# Patient Record
Sex: Female | Born: 1945 | Race: White | Hispanic: No | Marital: Single | Smoking: Former smoker
Health system: Southern US, Community
[De-identification: ages and names within clinical notes are randomized; demographics above are authoritative.]

## PROBLEM LIST (undated history)

## (undated) DIAGNOSIS — F419 Anxiety disorder, unspecified: Secondary | ICD-10-CM

## (undated) DIAGNOSIS — G43909 Migraine, unspecified, not intractable, without status migrainosus: Secondary | ICD-10-CM

## (undated) DIAGNOSIS — M810 Age-related osteoporosis without current pathological fracture: Secondary | ICD-10-CM

## (undated) DIAGNOSIS — T7840XA Allergy, unspecified, initial encounter: Secondary | ICD-10-CM

## (undated) DIAGNOSIS — M1990 Unspecified osteoarthritis, unspecified site: Secondary | ICD-10-CM

## (undated) DIAGNOSIS — I10 Essential (primary) hypertension: Secondary | ICD-10-CM

## (undated) DIAGNOSIS — G2581 Restless legs syndrome: Secondary | ICD-10-CM

## (undated) DIAGNOSIS — N6019 Diffuse cystic mastopathy of unspecified breast: Secondary | ICD-10-CM

## (undated) DIAGNOSIS — E785 Hyperlipidemia, unspecified: Secondary | ICD-10-CM

## (undated) DIAGNOSIS — R4020 Unspecified coma: Secondary | ICD-10-CM

## (undated) DIAGNOSIS — I639 Cerebral infarction, unspecified: Secondary | ICD-10-CM

## (undated) HISTORY — DX: Migraine, unspecified, not intractable, without status migrainosus: G43.909

## (undated) HISTORY — PX: ABDOMINAL HYSTERECTOMY: SHX81

## (undated) HISTORY — DX: Allergy, unspecified, initial encounter: T78.40XA

## (undated) HISTORY — DX: Diffuse cystic mastopathy of unspecified breast: N60.19

## (undated) HISTORY — PX: BREAST CYST ASPIRATION: SHX578

## (undated) HISTORY — DX: Age-related osteoporosis without current pathological fracture: M81.0

## (undated) HISTORY — DX: Essential (primary) hypertension: I10

## (undated) HISTORY — DX: Cerebral infarction, unspecified: I63.9

## (undated) HISTORY — DX: Unspecified coma: R40.20

## (undated) HISTORY — DX: Hyperlipidemia, unspecified: E78.5

## (undated) HISTORY — DX: Restless legs syndrome: G25.81

## (undated) HISTORY — DX: Unspecified osteoarthritis, unspecified site: M19.90

## (undated) HISTORY — DX: Anxiety disorder, unspecified: F41.9

---

## 1972-09-14 HISTORY — PX: TONSILLECTOMY AND ADENOIDECTOMY: SUR1326

## 1979-09-15 HISTORY — PX: TOTAL ABDOMINAL HYSTERECTOMY W/ BILATERAL SALPINGOOPHORECTOMY: SHX83

## 1995-02-13 HISTORY — PX: DOBUTAMINE STRESS ECHO: SHX5426

## 1997-09-14 HISTORY — PX: CHOLECYSTECTOMY: SHX55

## 1997-09-14 HISTORY — PX: APPENDECTOMY: SHX54

## 2000-01-15 ENCOUNTER — Encounter (INDEPENDENT_AMBULATORY_CARE_PROVIDER_SITE_OTHER): Payer: Self-pay | Admitting: Specialist

## 2000-01-15 ENCOUNTER — Ambulatory Visit (HOSPITAL_BASED_OUTPATIENT_CLINIC_OR_DEPARTMENT_OTHER): Admission: RE | Admit: 2000-01-15 | Discharge: 2000-01-15 | Payer: Self-pay | Admitting: General Surgery

## 2004-10-14 ENCOUNTER — Ambulatory Visit: Payer: Self-pay | Admitting: Internal Medicine

## 2004-11-07 ENCOUNTER — Ambulatory Visit: Payer: Self-pay | Admitting: Family Medicine

## 2005-06-09 ENCOUNTER — Ambulatory Visit: Payer: Self-pay | Admitting: Internal Medicine

## 2005-07-10 ENCOUNTER — Ambulatory Visit: Payer: Self-pay | Admitting: Internal Medicine

## 2005-12-03 ENCOUNTER — Ambulatory Visit: Payer: Self-pay | Admitting: Internal Medicine

## 2005-12-16 ENCOUNTER — Ambulatory Visit: Payer: Self-pay | Admitting: Internal Medicine

## 2006-01-25 ENCOUNTER — Ambulatory Visit: Payer: Self-pay | Admitting: Internal Medicine

## 2006-01-26 ENCOUNTER — Ambulatory Visit: Payer: Self-pay | Admitting: Internal Medicine

## 2006-02-15 ENCOUNTER — Ambulatory Visit: Payer: Self-pay | Admitting: Internal Medicine

## 2006-04-23 ENCOUNTER — Ambulatory Visit: Payer: Self-pay | Admitting: Internal Medicine

## 2006-09-24 ENCOUNTER — Ambulatory Visit: Payer: Self-pay | Admitting: Internal Medicine

## 2006-12-15 ENCOUNTER — Ambulatory Visit: Payer: Self-pay | Admitting: Internal Medicine

## 2006-12-15 LAB — CONVERTED CEMR LAB
Cholesterol: 217 mg/dL — ABNORMAL HIGH (ref 0–200)
Glucose, Bld: 82 mg/dL (ref 70–99)
HDL: 51 mg/dL (ref >39)
LDL Cholesterol: 136 mg/dL — ABNORMAL HIGH (ref 0–99)
Total CHOL/HDL Ratio: 4.3
Triglycerides: 151 mg/dL — ABNORMAL HIGH (ref <150)
VLDL: 30 mg/dL (ref 0–40)

## 2007-01-22 LAB — CONVERTED CEMR LAB: OCCULT 1: NEGATIVE

## 2007-01-23 LAB — CONVERTED CEMR LAB: OCCULT 3: NEGATIVE

## 2007-01-24 LAB — CONVERTED CEMR LAB: OCCULT 2: NEGATIVE

## 2007-01-28 ENCOUNTER — Ambulatory Visit: Payer: Self-pay | Admitting: Internal Medicine

## 2007-08-03 ENCOUNTER — Telehealth: Payer: Self-pay | Admitting: Internal Medicine

## 2007-09-26 ENCOUNTER — Telehealth (INDEPENDENT_AMBULATORY_CARE_PROVIDER_SITE_OTHER): Payer: Self-pay | Admitting: *Deleted

## 2007-09-27 ENCOUNTER — Telehealth (INDEPENDENT_AMBULATORY_CARE_PROVIDER_SITE_OTHER): Payer: Self-pay | Admitting: *Deleted

## 2007-11-22 ENCOUNTER — Telehealth: Payer: Self-pay | Admitting: Internal Medicine

## 2007-12-19 ENCOUNTER — Telehealth (INDEPENDENT_AMBULATORY_CARE_PROVIDER_SITE_OTHER): Payer: Self-pay | Admitting: *Deleted

## 2007-12-21 ENCOUNTER — Telehealth (INDEPENDENT_AMBULATORY_CARE_PROVIDER_SITE_OTHER): Payer: Self-pay | Admitting: *Deleted

## 2008-01-23 ENCOUNTER — Telehealth (INDEPENDENT_AMBULATORY_CARE_PROVIDER_SITE_OTHER): Payer: Self-pay | Admitting: *Deleted

## 2008-02-09 ENCOUNTER — Ambulatory Visit: Payer: Self-pay | Admitting: Internal Medicine

## 2008-02-09 DIAGNOSIS — G2581 Restless legs syndrome: Secondary | ICD-10-CM | POA: Insufficient documentation

## 2008-02-09 DIAGNOSIS — F419 Anxiety disorder, unspecified: Secondary | ICD-10-CM | POA: Insufficient documentation

## 2008-02-09 DIAGNOSIS — J309 Allergic rhinitis, unspecified: Secondary | ICD-10-CM | POA: Insufficient documentation

## 2008-02-09 DIAGNOSIS — F32A Depression, unspecified: Secondary | ICD-10-CM | POA: Insufficient documentation

## 2008-02-09 DIAGNOSIS — E785 Hyperlipidemia, unspecified: Secondary | ICD-10-CM | POA: Insufficient documentation

## 2008-02-10 ENCOUNTER — Telehealth (INDEPENDENT_AMBULATORY_CARE_PROVIDER_SITE_OTHER): Payer: Self-pay | Admitting: *Deleted

## 2008-02-10 LAB — CONVERTED CEMR LAB
ALT: 12 units/L (ref 0–35)
AST: 16 units/L (ref 0–37)
Albumin: 4.4 g/dL (ref 3.5–5.2)
Alkaline Phosphatase: 108 units/L (ref 39–117)
BUN: 17 mg/dL (ref 6–23)
Basophils Absolute: 0.1 10*3/uL (ref 0.0–0.1)
Basophils Relative: 1 % (ref 0–1)
Bilirubin, Direct: 0.1 mg/dL (ref 0.0–0.3)
CO2: 24 meq/L (ref 19–32)
Calcium: 9.4 mg/dL (ref 8.4–10.5)
Chloride: 104 meq/L (ref 96–112)
Creatinine, Ser: 0.71 mg/dL (ref 0.40–1.20)
Eosinophils Absolute: 0.1 10*3/uL (ref 0.0–0.7)
Eosinophils Relative: 2 % (ref 0–5)
Glucose, Bld: 60 mg/dL — ABNORMAL LOW (ref 70–99)
HCT: 40.2 % (ref 36.0–46.0)
Hemoglobin: 12.9 g/dL (ref 12.0–15.0)
Indirect Bilirubin: 0.5 mg/dL (ref 0.0–0.9)
Lymphocytes Relative: 31 % (ref 12–46)
Lymphs Abs: 2.2 10*3/uL (ref 0.7–4.0)
MCHC: 32.1 g/dL (ref 30.0–36.0)
MCV: 88.2 fL (ref 78.0–100.0)
Monocytes Absolute: 0.7 10*3/uL (ref 0.1–1.0)
Monocytes Relative: 10 % (ref 3–12)
Neutro Abs: 3.9 10*3/uL (ref 1.7–7.7)
Neutrophils Relative %: 56 % (ref 43–77)
Phosphorus: 3.8 mg/dL (ref 2.3–4.6)
Platelets: 291 10*3/uL (ref 150–400)
Potassium: 5.4 meq/L — ABNORMAL HIGH (ref 3.5–5.3)
RBC: 4.56 M/uL (ref 3.87–5.11)
RDW: 14.4 % (ref 11.5–15.5)
Sodium: 143 meq/L (ref 135–145)
TSH: 2.542 microintl units/mL (ref 0.350–5.50)
Total Bilirubin: 0.6 mg/dL (ref 0.3–1.2)
Total Protein: 7.2 g/dL (ref 6.0–8.3)
WBC: 6.9 10*3/uL (ref 4.0–10.5)

## 2008-02-16 ENCOUNTER — Telehealth (INDEPENDENT_AMBULATORY_CARE_PROVIDER_SITE_OTHER): Payer: Self-pay | Admitting: *Deleted

## 2008-03-02 ENCOUNTER — Ambulatory Visit: Payer: Self-pay | Admitting: Internal Medicine

## 2008-03-02 LAB — CONVERTED CEMR LAB
OCCULT 1: NEGATIVE
OCCULT 2: NEGATIVE
OCCULT 3: NEGATIVE

## 2008-04-11 ENCOUNTER — Telehealth (INDEPENDENT_AMBULATORY_CARE_PROVIDER_SITE_OTHER): Payer: Self-pay | Admitting: *Deleted

## 2008-05-25 ENCOUNTER — Telehealth: Payer: Self-pay | Admitting: Internal Medicine

## 2008-06-06 ENCOUNTER — Encounter: Payer: Self-pay | Admitting: Internal Medicine

## 2008-06-08 ENCOUNTER — Telehealth: Payer: Self-pay | Admitting: Internal Medicine

## 2008-06-12 ENCOUNTER — Encounter: Payer: Self-pay | Admitting: Internal Medicine

## 2008-07-02 ENCOUNTER — Telehealth (INDEPENDENT_AMBULATORY_CARE_PROVIDER_SITE_OTHER): Payer: Self-pay | Admitting: *Deleted

## 2008-07-05 ENCOUNTER — Telehealth: Payer: Self-pay | Admitting: Internal Medicine

## 2008-07-09 ENCOUNTER — Encounter: Payer: Self-pay | Admitting: Internal Medicine

## 2008-09-13 ENCOUNTER — Telehealth (INDEPENDENT_AMBULATORY_CARE_PROVIDER_SITE_OTHER): Payer: Self-pay | Admitting: Internal Medicine

## 2008-09-17 ENCOUNTER — Telehealth: Payer: Self-pay | Admitting: Internal Medicine

## 2008-09-18 ENCOUNTER — Telehealth (INDEPENDENT_AMBULATORY_CARE_PROVIDER_SITE_OTHER): Payer: Self-pay | Admitting: *Deleted

## 2008-10-26 ENCOUNTER — Telehealth: Payer: Self-pay | Admitting: Internal Medicine

## 2009-01-21 ENCOUNTER — Telehealth: Payer: Self-pay | Admitting: Internal Medicine

## 2009-02-08 ENCOUNTER — Ambulatory Visit: Payer: Self-pay | Admitting: Internal Medicine

## 2009-02-08 DIAGNOSIS — G5710 Meralgia paresthetica, unspecified lower limb: Secondary | ICD-10-CM | POA: Insufficient documentation

## 2009-03-04 ENCOUNTER — Telehealth: Payer: Self-pay | Admitting: Internal Medicine

## 2009-03-05 ENCOUNTER — Telehealth: Payer: Self-pay | Admitting: Internal Medicine

## 2009-03-07 ENCOUNTER — Ambulatory Visit: Payer: Self-pay | Admitting: Internal Medicine

## 2009-03-08 LAB — CONVERTED CEMR LAB
Albumin: 4.2 g/dL (ref 3.5–5.2)
BUN: 19 mg/dL (ref 6–23)
Basophils Absolute: 0 10*3/uL (ref 0.0–0.1)
Basophils Relative: 0.2 % (ref 0.0–3.0)
CO2: 29 meq/L (ref 19–32)
Calcium: 9.3 mg/dL (ref 8.4–10.5)
Chloride: 104 meq/L (ref 96–112)
Creatinine, Ser: 0.8 mg/dL (ref 0.4–1.2)
Eosinophils Absolute: 0.1 10*3/uL (ref 0.0–0.7)
Eosinophils Relative: 1.4 % (ref 0.0–5.0)
Glucose, Bld: 85 mg/dL (ref 70–99)
HCT: 36.2 % (ref 36.0–46.0)
Hemoglobin: 12.4 g/dL (ref 12.0–15.0)
Lymphocytes Relative: 30.6 % (ref 12.0–46.0)
Lymphs Abs: 2.3 10*3/uL (ref 0.7–4.0)
MCHC: 34.4 g/dL (ref 30.0–36.0)
MCV: 87.1 fL (ref 78.0–100.0)
Monocytes Absolute: 0.8 10*3/uL (ref 0.1–1.0)
Monocytes Relative: 10.2 % (ref 3.0–12.0)
Neutro Abs: 4.4 10*3/uL (ref 1.4–7.7)
Neutrophils Relative %: 57.6 % (ref 43.0–77.0)
Phosphorus: 4.2 mg/dL (ref 2.3–4.6)
Platelets: 245 10*3/uL (ref 150.0–400.0)
Potassium: 4.9 meq/L (ref 3.5–5.1)
RBC: 4.15 M/uL (ref 3.87–5.11)
RDW: 13 % (ref 11.5–14.6)
Sodium: 140 meq/L (ref 135–145)
TSH: 1.98 microintl units/mL (ref 0.35–5.50)
WBC: 7.6 10*3/uL (ref 4.5–10.5)

## 2009-03-13 ENCOUNTER — Telehealth: Payer: Self-pay | Admitting: Internal Medicine

## 2009-03-19 ENCOUNTER — Telehealth: Payer: Self-pay | Admitting: Internal Medicine

## 2009-04-24 ENCOUNTER — Telehealth: Payer: Self-pay | Admitting: Internal Medicine

## 2009-05-15 ENCOUNTER — Ambulatory Visit: Payer: Self-pay | Admitting: Internal Medicine

## 2009-05-16 LAB — CONVERTED CEMR LAB
Albumin: 4.1 g/dL (ref 3.5–5.2)
BUN: 18 mg/dL (ref 6–23)
Basophils Absolute: 0.1 10*3/uL (ref 0.0–0.1)
Basophils Relative: 1.4 % (ref 0.0–3.0)
CO2: 30 meq/L (ref 19–32)
Calcium: 9.5 mg/dL (ref 8.4–10.5)
Chloride: 106 meq/L (ref 96–112)
Creatinine, Ser: 0.9 mg/dL (ref 0.4–1.2)
Eosinophils Absolute: 0.2 10*3/uL (ref 0.0–0.7)
Eosinophils Relative: 2.6 % (ref 0.0–5.0)
Glucose, Bld: 83 mg/dL (ref 70–99)
HCT: 39.4 % (ref 36.0–46.0)
Hemoglobin: 13.1 g/dL (ref 12.0–15.0)
Lymphocytes Relative: 28.7 % (ref 12.0–46.0)
Lymphs Abs: 2.2 10*3/uL (ref 0.7–4.0)
MCHC: 33.4 g/dL (ref 30.0–36.0)
MCV: 88.4 fL (ref 78.0–100.0)
Monocytes Absolute: 0.9 10*3/uL (ref 0.1–1.0)
Monocytes Relative: 11.7 % (ref 3.0–12.0)
Neutro Abs: 4.3 10*3/uL (ref 1.4–7.7)
Neutrophils Relative %: 55.6 % (ref 43.0–77.0)
Phosphorus: 4.2 mg/dL (ref 2.3–4.6)
Platelets: 258 10*3/uL (ref 150.0–400.0)
Potassium: 4.4 meq/L (ref 3.5–5.1)
RBC: 4.46 M/uL (ref 3.87–5.11)
RDW: 12.9 % (ref 11.5–14.6)
Sodium: 142 meq/L (ref 135–145)
TSH: 2.14 microintl units/mL (ref 0.35–5.50)
WBC: 7.7 10*3/uL (ref 4.5–10.5)

## 2009-05-27 ENCOUNTER — Telehealth: Payer: Self-pay | Admitting: Internal Medicine

## 2009-05-28 ENCOUNTER — Telehealth: Payer: Self-pay | Admitting: Internal Medicine

## 2009-05-29 ENCOUNTER — Ambulatory Visit: Payer: Self-pay | Admitting: Internal Medicine

## 2009-06-03 ENCOUNTER — Telehealth: Payer: Self-pay | Admitting: Internal Medicine

## 2009-06-14 ENCOUNTER — Ambulatory Visit: Payer: Self-pay | Admitting: Internal Medicine

## 2009-06-17 ENCOUNTER — Telehealth: Payer: Self-pay | Admitting: Internal Medicine

## 2009-06-17 LAB — CONVERTED CEMR LAB: Fecal Occult Bld: NEGATIVE

## 2009-06-19 ENCOUNTER — Encounter: Payer: Self-pay | Admitting: Internal Medicine

## 2009-07-10 ENCOUNTER — Encounter: Payer: Self-pay | Admitting: Internal Medicine

## 2009-07-10 LAB — HM MAMMOGRAPHY: HM Mammogram: NORMAL

## 2009-08-14 ENCOUNTER — Telehealth: Payer: Self-pay | Admitting: Internal Medicine

## 2009-09-03 ENCOUNTER — Telehealth: Payer: Self-pay | Admitting: Internal Medicine

## 2009-10-21 ENCOUNTER — Telehealth: Payer: Self-pay | Admitting: Internal Medicine

## 2009-11-04 ENCOUNTER — Telehealth: Payer: Self-pay | Admitting: Internal Medicine

## 2009-11-13 ENCOUNTER — Ambulatory Visit: Payer: Self-pay | Admitting: Internal Medicine

## 2009-12-18 ENCOUNTER — Encounter: Payer: Self-pay | Admitting: Internal Medicine

## 2009-12-20 ENCOUNTER — Telehealth: Payer: Self-pay | Admitting: Internal Medicine

## 2010-01-16 ENCOUNTER — Telehealth: Payer: Self-pay | Admitting: Family Medicine

## 2010-01-30 ENCOUNTER — Ambulatory Visit: Payer: Self-pay | Admitting: Internal Medicine

## 2010-01-30 DIAGNOSIS — L723 Sebaceous cyst: Secondary | ICD-10-CM

## 2010-02-19 ENCOUNTER — Telehealth: Payer: Self-pay | Admitting: Internal Medicine

## 2010-03-24 ENCOUNTER — Telehealth: Payer: Self-pay | Admitting: Internal Medicine

## 2010-04-23 ENCOUNTER — Telehealth: Payer: Self-pay | Admitting: Internal Medicine

## 2010-05-06 ENCOUNTER — Telehealth: Payer: Self-pay | Admitting: Internal Medicine

## 2010-05-12 ENCOUNTER — Telehealth: Payer: Self-pay | Admitting: Internal Medicine

## 2010-07-01 ENCOUNTER — Telehealth: Payer: Self-pay | Admitting: Internal Medicine

## 2010-07-22 ENCOUNTER — Ambulatory Visit: Payer: Self-pay | Admitting: Internal Medicine

## 2010-07-22 LAB — CONVERTED CEMR LAB
Bilirubin Urine: NEGATIVE
Glucose, Urine, Semiquant: NEGATIVE
Ketones, urine, test strip: NEGATIVE
Specific Gravity, Urine: 1.015
Urobilinogen, UA: 0.2
pH: 6

## 2010-08-13 ENCOUNTER — Ambulatory Visit: Payer: Self-pay | Admitting: Internal Medicine

## 2010-08-13 DIAGNOSIS — M1990 Unspecified osteoarthritis, unspecified site: Secondary | ICD-10-CM | POA: Insufficient documentation

## 2010-08-15 LAB — CONVERTED CEMR LAB
ALT: 16 units/L (ref 0–35)
Alkaline Phosphatase: 106 units/L (ref 39–117)
Basophils Relative: 0.7 % (ref 0.0–3.0)
Bilirubin, Direct: 0.1 mg/dL (ref 0.0–0.3)
Direct LDL: 147.4 mg/dL
Eosinophils Absolute: 0.1 10*3/uL (ref 0.0–0.7)
Eosinophils Relative: 1.2 % (ref 0.0–5.0)
Glucose, Bld: 87 mg/dL (ref 70–99)
HDL: 61.4 mg/dL (ref >39.00)
Lymphocytes Relative: 26.3 % (ref 12.0–46.0)
Neutrophils Relative %: 61.6 % (ref 43.0–77.0)
Phosphorus: 4 mg/dL (ref 2.3–4.6)
Platelets: 274 10*3/uL (ref 150.0–400.0)
Potassium: 4.8 meq/L (ref 3.5–5.1)
RBC: 4.44 M/uL (ref 3.87–5.11)
Sodium: 142 meq/L (ref 135–145)
Total Bilirubin: 0.5 mg/dL (ref 0.3–1.2)
Total CHOL/HDL Ratio: 4
Total Protein: 7.3 g/dL (ref 6.0–8.3)
WBC: 7.9 10*3/uL (ref 4.5–10.5)

## 2010-08-18 ENCOUNTER — Telehealth: Payer: Self-pay | Admitting: Internal Medicine

## 2010-08-27 ENCOUNTER — Telehealth: Payer: Self-pay | Admitting: Internal Medicine

## 2010-09-19 ENCOUNTER — Ambulatory Visit
Admission: RE | Admit: 2010-09-19 | Discharge: 2010-09-19 | Payer: Self-pay | Source: Home / Self Care | Attending: Internal Medicine | Admitting: Internal Medicine

## 2010-09-19 ENCOUNTER — Other Ambulatory Visit: Payer: Self-pay | Admitting: Internal Medicine

## 2010-09-19 LAB — FECAL OCCULT BLOOD, IMMUNOCHEMICAL: Fecal Occult Bld: NEGATIVE

## 2010-09-22 ENCOUNTER — Encounter: Payer: Self-pay | Admitting: Internal Medicine

## 2010-10-14 NOTE — Progress Notes (Signed)
Summary: refill request for clonazepam  Phone Note Refill Request Message from:  Fax from Pharmacy  Refills Requested: Medication #1:  CLONAZEPAM 1 MG TABS Take 1-3 tablet by mouth at bedtime.   Last Refilled: 01/16/2010 Faxed request from [facility] is on your desk.  Initial call taken by: Lowella Petties CMA,  February 19, 2010 10:46 AM  Follow-up for Phone Call        okay #90 x 0 Follow-up by: Cindee Salt MD,  February 19, 2010 2:18 PM  Additional Follow-up for Phone Call Additional follow up Details #1::        Rx faxed to pharmacy Additional Follow-up by: DeShannon Smith CMA Duncan Dull),  February 19, 2010 4:31 PM    Prescriptions: CLONAZEPAM 1 MG TABS (CLONAZEPAM) Take 1-3 tablet by mouth at bedtime  #90 x 0   Entered by:   Mervin Hack CMA (AAMA)   Authorized by:   Cindee Salt MD   Signed by:   Mervin Hack CMA (AAMA) on 02/19/2010   Method used:   Handwritten   RxID:   [id number]

## 2010-10-14 NOTE — Progress Notes (Signed)
Summary: rx-lorazepam  Phone Note Refill Request Message from:  Fax from Pharmacy on October 21, 2009 10:15 AM  Refills Requested: Medication #1:  LORAZEPAM 0.5 MG TABS Take 1 or 2  tablets  by mouth two times a day as needed for nerves   Last Refilled: 09/21/2009 #120. [facility] [place] [id number] [phone]  Initial call taken by: Silas Sacramento CMA,  October 21, 2009 10:16 AM  Follow-up for Phone Call        okay #120 x 0 Follow-up by: Cindee Salt MD,  October 21, 2009 10:35 AM  Additional Follow-up for Phone Call Additional follow up Details #1::        Rx faxed to pharmacy Additional Follow-up by: DeShannon Smith CMA Duncan Dull),  October 21, 2009 11:54 AM    Prescriptions: LORAZEPAM 0.5 MG TABS (LORAZEPAM) Take 1 or 2  tablets  by mouth two times a day as needed for nerves  #120 x 0   Entered by:   Mervin Hack CMA (AAMA)   Authorized by:   Cindee Salt MD   Signed by:   Mervin Hack CMA (AAMA) on 10/21/2009   Method used:   Handwritten   RxID:   [id number]

## 2010-10-14 NOTE — Miscellaneous (Signed)
Summary: Health Care Power of Attorney Form,[employer]  Health Care Power of Attorney Form,[employer] Death Form   Imported By: Beau Fanny 08/13/2010 13:55:06  _____________________________________________________________________  External Attachment:    Type:   Image     Comment:   External Document

## 2010-10-14 NOTE — Letter (Signed)
Summary: [facility]: Immunoassay Fecal Occult Blood (iFOB) Order Form  [place] [facility]  [address] [place], [place] [address]   Phone: [phone]  Fax: [phone]      [facility]: Immunoassay Fecal Occult Blood (iFOB) Order Form   August 13, 2010 MRN: [id number]   AMESHA BAILEY 08/02/1946   Physicican Name:_________[place]________________  Diagnosis Code:_________V76.51_________________      Cindee Salt MD

## 2010-10-14 NOTE — Assessment & Plan Note (Signed)
Summary: KNOT LEFT LEG   Vital Signs:  Patient profile:   65 year old female Weight:      171 pounds BMI:     26.09 Temp:     98.2 degrees F oral Pulse rate:   64 / minute Pulse rhythm:   regular BP sitting:   158 / 80  (left arm) Cuff size:   regular  Vitals Entered By: Mervin Hack CMA Duncan Dull) (Jan 30, 2010 4:22 PM) CC: knot on leg   History of Present Illness: Noticed a lump at upper part of left thigh Hurts ----ibuprofen has not been helping has been worsening over the past 2 weeks---first noticed  ~1 month ago  No scratches on leg No cat exposure  No fever No night sweats  Allergies: 1)  ! Sulfa 2)  ! Cipro 3)  ! Tetracycline 4)  ! Darvon 5)  ! Betadine 6)  ! * Darvocette 7)  ! Carbidopa-Levodopa (Carbidopa-Levodopa)  Past History:  Past medical, surgical, family and social histories (including risk factors) reviewed for relevance to current acute and chronic problems.  Past Medical History: Reviewed history from 02/09/2008 and no changes required. Allergic rhinitis Anxiety Fibrocystic breasts Hyperlipidemia--mild Restless legs syndrome Migraines  Past Surgical History: Reviewed history from 02/09/2008 and no changes required. 1974  Tonsils/adenoids 1981 Hysterectomy/BSO 6/96  Negative stress echo 1999 Cholecystectomy/appendix 5/01 Abdominal wall lipoma  Family History: Reviewed history from 02/09/2008 and no changes required. Mom with CHF, AAA, DM Dad died with CHF, CAD @84  1 brother shot 1 brother still living ! sister died very young No colon or breast cancer some other DM on Mom's side  Social History: Reviewed history from 05/15/2009 and no changes required. 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   Abdomen:  soft, non-tender, and no  masses.   Skin:  no lesions on left foot/leg except for a plantar wart under 2nd MTP  has slightly irregular subcutaneous mass under upper left anterior thigh partially movable under skin no obvious inflammation or infection Inguinal Nodes:  No significant adenopathy   Impression & Recommendations:  Problem # 1:  SEBACEOUS CYST (ICD-706.2) Assessment New semes to be a cyst or perhaps lipoma reassured her that it is not concerning will monitor---if keeps growing or causes more discomfort, would schedule visit to excise  Complete Medication List: 1)  Fexofenadine Hcl 30 Mg Tabs (Fexofenadine hcl) .... Take 1 tablet by mouth two times a day as needed 2)  Clonazepam 1 Mg Tabs (Clonazepam) .... Take 1-3 tablet by mouth at bedtime  Patient Instructions: 1)  Keep July appt  Current Allergies (reviewed today): ! SULFA ! CIPRO ! TETRACYCLINE ! DARVON ! BETADINE ! * DARVOCETTE ! CARBIDOPA-LEVODOPA (CARBIDOPA-LEVODOPA)

## 2010-10-14 NOTE — Progress Notes (Signed)
Summary: clonazepam   Phone Note Refill Request Message from:  Fax from Pharmacy on August 18, 2010 4:43 PM  Refills Requested: Medication #1:  CLONAZEPAM 1 MG TABS Take 1 1/2  tablet by mouth at bedtime.   Last Refilled: 07/02/2010 Refill request from [facility]. [phone]. Fax is on your desk.   Initial call taken by: Melody Comas,  August 18, 2010 4:45 PM  Follow-up for Phone Call        see the change in dose and report to pharmacy Okay #45 x 2 Follow-up by: Cindee Salt MD,  August 18, 2010 5:31 PM  Additional Follow-up for Phone Call Additional follow up Details #1::        Rx called to pharmacy Additional Follow-up by: DeShannon Smith CMA Duncan Dull),  August 19, 2010 8:29 AM    Prescriptions: CLONAZEPAM 1 MG TABS (CLONAZEPAM) Take 1 1/2  tablet by mouth at bedtime  #45 x 2   Entered by:   Mervin Hack CMA (AAMA)   Authorized by:   Cindee Salt MD   Signed by:   Mervin Hack CMA (AAMA) on 08/19/2010   Method used:   Telephoned to ...       [facility]* (retail)       [address] [place], [place]  [address]       Ph: [phone]       Fax: [phone]   RxID:   [id number]

## 2010-10-14 NOTE — Progress Notes (Signed)
Summary: Labwork  Phone Note Call from Patient Call back at Home Phone [phone]   Caller: Patient Summary of Call: would like to know if you were doing labwork at her visit on 11/13/2009? if so can she have it done before the visit? Initial call taken by: Mervin Hack CMA Duncan Dull),  November 04, 2009 11:13 AM  Follow-up for Phone Call        Labs were all normal at her last visit so no new labs are anticipated at that visit Follow-up by: Cindee Salt MD,  November 04, 2009 1:51 PM  Additional Follow-up for Phone Call Additional follow up Details #1::        Spoke with patient and advised results.  Additional Follow-up by: Mervin Hack CMA Duncan Dull),  November 04, 2009 2:46 PM

## 2010-10-14 NOTE — Assessment & Plan Note (Signed)
Summary: ? UTI   Vital Signs:  Patient profile:   65 year old female Weight:      174 pounds Temp:     98.3 degrees F oral BP sitting:   150 / 80  (left arm) Cuff size:   regular  Vitals Entered By: Mervin Hack CMA Duncan Dull) (July 22, 2010 12:51 PM) CC: UTI   History of Present Illness: Had a bad night not sleeping well last night tried some tea later she had urgency and got pain in both lower abd sides---pressure sensation  had previous problems with recurrent bladder infections saw urologist and felt it might be post coital used macrodantin back then cranberry never helped  now feels the pressure persistently May have held urine too long while out in afternoon  No fever   Allergies: 1)  ! Sulfa 2)  ! Cipro 3)  ! Tetracycline 4)  ! Darvon 5)  ! Betadine 6)  ! * Darvocette 7)  ! Carbidopa-Levodopa (Carbidopa-Levodopa)  Review of Systems       No vomiting or diarrhea appetite is okay  Physical Exam  General:  alert and normal appearance.   Abdomen:  soft, non-tender, and no masses.   Msk:  No CVA tenderness   Impression & Recommendations:  Problem # 1:  ACUTE CYSTITIS (ICD-595.0) Assessment New  classic symptoms for her no evidence of UTI will give Rx for macrodantin--3 days may be enough  Her updated medication list for this problem includes:    Macrodantin 50 Mg Caps (Nitrofurantoin macrocrystal) .Marland Kitchen... 1 tab by mouth two times a day for bladder infection  Orders: UA Dipstick w/o Micro (manual) (16109)  Complete Medication List: 1)  Fexofenadine Hcl 30 Mg Tabs (Fexofenadine hcl) .... Take 1 tablet by mouth two times a day as needed 2)  Clonazepam 1 Mg Tabs (Clonazepam) .... Take 1 1/2  tablet by mouth at bedtime 3)  Macrodantin 50 Mg Caps (Nitrofurantoin macrocrystal) .Marland Kitchen.. 1 tab by mouth two times a day for bladder infection  Patient Instructions: 1)  Start the [facility] for your bladder infection. If the symptoms are better after 1-2  doses, 3 days of the medicine may be all you  need 2)  Please keep regular appt Prescriptions: MACRODANTIN 50 MG CAPS (NITROFURANTOIN MACROCRYSTAL) 1 tab by mouth two times a day for bladder infection  #20 x 0   Entered and Authorized by:   Cindee Salt MD   Signed by:   Cindee Salt MD on 07/22/2010   Method used:   Electronically to        [facility]* (retail)       [address] [place], [place]  [address]       Ph: [phone]       Fax: [phone]   RxID:   [id number]    Orders Added: 1)  Est. Patient Level III [44010] 2)  UA Dipstick w/o Micro (manual) [81002]    Current Allergies (reviewed today): ! SULFA ! CIPRO ! TETRACYCLINE ! DARVON ! BETADINE ! * DARVOCETTE ! CARBIDOPA-LEVODOPA (CARBIDOPA-LEVODOPA)  Laboratory Results   Urine Tests  Date/Time Received: July 22, 2010 1:06 PM  Date/Time Reported: July 22, 2010 1:06 PM   Routine Urinalysis   Color: yellow Appearance: Clear Glucose: negative   (Normal Range: Negative) Bilirubin: negative   (Normal Range: Negative) Ketone: negative   (Normal Range: Negative) Spec.  Gravity: 1.015   (Normal Range: 1.003-1.035) Blood: negative   (Normal Range: Negative) pH: 6.0   (Normal Range: 5.0-8.0) Protein: negative   (Normal Range: Negative) Urobilinogen: 0.2   (Normal Range: 0-1) Nitrite: negative   (Normal Range: Negative) Leukocyte Esterace: negative   (Normal Range: Negative)

## 2010-10-14 NOTE — Assessment & Plan Note (Signed)
Summary: 6 M F/U DLO R/S FROM 11/18/09   Vital Signs:  Patient profile:   65 year old female Weight:      171 pounds Temp:     98.1 degrees F oral Pulse rate:   64 / minute Pulse rhythm:   regular Cuff size:   regular  Vitals Entered By: Mervin Hack CMA Duncan Dull) (November 13, 2009 10:49 AM) CC: 6 month follow-up, Hypertension Management   History of Present Illness: doing okay No new concerns  Trying to avoid the daytime lorazepam Husband will make her nervous in the car, etc Continues to take undue responsibility for his husband's stroke she has spoken to a Haematologist (through work) and Programmer, multimedia did just put marker on brother's grave--has gotten over some of this  still takes the klonopin at night helps the restless legs  Hypertension History:      Positive major cardiovascular risk factors include female age 41 years old or older and hyperlipidemia.  Negative major cardiovascular risk factors include non-tobacco-user status.     Allergies: 1)  ! Sulfa 2)  ! Cipro 3)  ! Tetracycline 4)  ! Darvon 5)  ! Betadine 6)  ! * Darvocette 7)  ! Carbidopa-Levodopa (Carbidopa-Levodopa)  Past History:  Past medical, surgical, family and social histories (including risk factors) reviewed for relevance to current acute and chronic problems.  Past Medical History: Reviewed history from 02/09/2008 and no changes required. Allergic rhinitis Anxiety Fibrocystic breasts Hyperlipidemia--mild Restless legs syndrome Migraines  Past Surgical History: Reviewed history from 02/09/2008 and no changes required. 1974  Tonsils/adenoids 1981 Hysterectomy/BSO 6/96  Negative stress echo 1999 Cholecystectomy/appendix 5/01 Abdominal wall lipoma  Family History: Reviewed history from 02/09/2008 and no changes required. Mom with CHF, AAA, DM Dad died with CHF, CAD @84  1 brother shot 1 brother still living ! sister died very young No colon or breast cancer some other DM on Mom's  side  Social History: Reviewed history from 05/15/2009 and no changes required. 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  NAD Psych:  normally interactive, good eye contact, not depressed appearing, and slightly anxious.     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 Take 1 or 2  tablets  by mouth two times a day as needed for nerves Her updated medication list for this problem includes:    Clonazepam 1 Mg Tabs (Clonazepam) .Marland Kitchen... Take 1-3 tablet by mouth at bedtime  Complete Medication List: 1)  Fexofenadine Hcl 30 Mg Tabs (Fexofenadine hcl) .... Take 1 tablet by mouth two times a day as needed 2)  Clonazepam 1 Mg Tabs (Clonazepam) .... Take 1-3 tablet by mouth at bedtime 3)  Cortisporin 3.5-10000-1 Susp (Neomycin-polymyxin-hc) .... 4 drops in ears up to four times daily for pain  Hypertension Assessment/Plan:  The patient's hypertensive risk group is category B: At least one risk factor (excluding diabetes) with no target organ damage.  Her calculated 10 year risk of coronary heart disease is 8 %.    Patient Instructions: 1)  Please schedule a follow-up appointment in 6 months .  Prescriptions: CLONAZEPAM 1 MG TABS (CLONAZEPAM) Take 1-3 tablet by mouth at bedtime  #90 x 1   Entered and  Authorized by:   Cindee Salt MD   Signed by:   Cindee Salt MD on 11/13/2009   Method used:   Print then Give to Patient   RxID:   [id number]   Current Allergies (reviewed today): ! SULFA ! CIPRO ! TETRACYCLINE ! DARVON ! BETADINE ! * DARVOCETTE ! CARBIDOPA-LEVODOPA (CARBIDOPA-LEVODOPA)

## 2010-10-14 NOTE — Progress Notes (Signed)
Summary: refill request for clonazepam  Phone Note Refill Request Message from:  Fax from Pharmacy  Refills Requested: Medication #1:  CLONAZEPAM 1 MG TABS Take 1-3 tablet by mouth at bedtime.   Last Refilled: 03/24/2010 Faxed request from [facility] is on your desk.  Initial call taken by: Lowella Petties CMA,  May 12, 2010 10:46 AM  Follow-up for Phone Call        okay #90 x 0 Follow-up by: Cindee Salt MD,  May 12, 2010 1:41 PM  Additional Follow-up for Phone Call Additional follow up Details #1::        Rx faxed to pharmacy Additional Follow-up by: DeShannon Smith CMA Duncan Dull),  May 12, 2010 2:07 PM    Prescriptions: CLONAZEPAM 1 MG TABS (CLONAZEPAM) Take 1-3 tablet by mouth at bedtime  #90 x 0   Entered by:   Mervin Hack CMA (AAMA)   Authorized by:   Cindee Salt MD   Signed by:   Mervin Hack CMA (AAMA) on 05/12/2010   Method used:   Handwritten   RxID:   [id number]

## 2010-10-14 NOTE — Progress Notes (Signed)
Summary: canceled appt  Phone Note Call from Patient Call back at [phone]   Summary of Call: Patient had an appt for 8-11, she called and canceled it and she has scheduled a cpx for november. She wants to know if you are alright with her not coming in until then.  Initial call taken by: Melody Comas,  April 23, 2010 9:07 AM  Follow-up for Phone Call        okay to wait till physical as long as she is doing okay    Follow-up by: Cindee Salt MD,  April 23, 2010 2:06 PM  Additional Follow-up for Phone Call Additional follow up Details #1::        Patient advised as instructed via telephone.  She is doing fine and will call before November if she needs to.  She also wanted to add that she is cutting the Clonazepam in half, she said you would be pleased with that.  Linde Gillis CMA Duncan Dull)  April 23, 2010 2:25 PM    Yes--As long as she is doing well with her needs for meds, we  can wait for PE Additional Follow-up by: Cindee Salt MD,  April 23, 2010 7:02 PM

## 2010-10-14 NOTE — Progress Notes (Signed)
Summary: Pain in leg  Phone Note Call from Patient Call back at Home Phone [phone] Call back at [phone]   Caller: Patient Call For: Cindee Salt MD Summary of Call: patient calling complaining of pain in her upper left leg right at her waist. Her biggest issue is the pain, it keeps her up at night, she just would like to know if she should be concerned. Please advise. Initial call taken by: Mervin Hack CMA Duncan Dull),  December 20, 2009 3:08 PM  Follow-up for Phone Call        probably nothing serious try heat on the area If pain is severe, or bad swelling, can get appt in Saturday clinic or go to ER I can check her next week if not improving Follow-up by: Cindee Salt MD,  December 20, 2009 3:37 PM  Additional Follow-up for Phone Call Additional follow up Details #1::        Spoke with patient and advised results.  Additional Follow-up by: Mervin Hack CMA Duncan Dull),  December 20, 2009 4:20 PM

## 2010-10-14 NOTE — Progress Notes (Signed)
Summary: refill request for clonazepam  Phone Note Refill Request Message from:  Fax from Pharmacy  Refills Requested: Medication #1:  CLONAZEPAM 1 MG TABS Take 1-3 tablet by mouth at bedtime.   Last Refilled: 05/12/2010 Faxed request from [facility] is on your desk.  Initial call taken by: Lowella Petties CMA,  July 01, 2010 4:37 PM  Follow-up for Phone Call        Okay #90 x 0 Follow-up by: Cindee Salt MD,  July 02, 2010 8:00 AM  Additional Follow-up for Phone Call Additional follow up Details #1::        Rx faxed to pharmacy Additional Follow-up by: DeShannon Smith CMA Duncan Dull),  July 02, 2010 9:37 AM    Prescriptions: CLONAZEPAM 1 MG TABS (CLONAZEPAM) Take 1-3 tablet by mouth at bedtime  #90 x 0   Entered by:   Mervin Hack CMA (AAMA)   Authorized by:   Cindee Salt MD   Signed by:   Mervin Hack CMA (AAMA) on 07/02/2010   Method used:   Handwritten   RxID:   [id number]

## 2010-10-14 NOTE — Assessment & Plan Note (Signed)
Summary: CPX PER MD/DLO   Vital Signs:  Patient profile:   65 year old female Height:      68 inches Weight:      174 pounds Temp:     97.7 degrees F oral Pulse rate:   72 / minute Pulse rhythm:   regular BP sitting:   122 / 78  (left arm) Cuff size:   regular  Vitals Entered By: Mervin Hack CMA Duncan Dull) (August 13, 2010 8:29 AM) CC: adult physical   History of Present Illness: Here for physical no new concerns Discussed and reviewed her health care POA and preferences  Anxiety is about the same still worries about husband---he won't make decisions so she has stress with this   Allergies: 1)  ! Sulfa 2)  ! Cipro 3)  ! Tetracycline 4)  ! Darvon 5)  ! Betadine 6)  ! * Darvocette 7)  ! Carbidopa-Levodopa (Carbidopa-Levodopa)  Past History:  Past medical, surgical, family and social histories (including risk factors) reviewed for relevance to current acute and chronic problems.  Past Medical History: Allergic rhinitis Anxiety Fibrocystic breasts Hyperlipidemia--mild Restless legs syndrome Migraines Osteoarthritis  Past Surgical History: Reviewed history from 02/09/2008 and no changes required. 1974  Tonsils/adenoids 1981 Hysterectomy/BSO 6/96  Negative stress echo 1999 Cholecystectomy/appendix 5/01 Abdominal wall lipoma  Family History: Reviewed history from 02/09/2008 and no changes required. Mom with CHF, AAA, DM Dad died with CHF, CAD @84  1 brother shot 1 brother still living ! 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 would accept resuscitation Wouldn't want nutritional support for vegetative state  Review of Systems General:  weight is stable Sleep is variable---has occ bad nights wears seat belt. Eyes:  Denies double vision and vision loss-1 eye;  recent eye exam fine. ENT:  Denies decreased hearing and ringing in ears; teeth fine--keeps up with dentist. CV:  Complains of lightheadness; denies chest pain or discomfort, difficulty breathing at night, difficulty breathing while lying down, fainting, palpitations, and shortness of breath with exertion; occ dizzy feeling if she gets up fast or goes without eating. Resp:  Denies cough and shortness of breath. GI:  Complains of indigestion; denies abdominal pain, bloody stools, change in bowel habits, dark tarry stools, nausea, and vomiting; rare heartburn depending on what she eats (cucumbers, cantelope). GU:  Denies dysuria and incontinence; no sexual problems. MS:  Complains of joint pain; denies joint swelling; intermittent hand, knee, hip aching ibuprofen helps tylenol gives diarrhea. Derm:  Denies lesion(s) and rash. Neuro:  Complains of headaches; denies numbness, tingling, and weakness; rare headaches with weather fronts. Psych:  Complains of anxiety; denies depression. Heme:  Denies abnormal bruising and enlarge lymph nodes. Allergy:  Complains of seasonal allergies and sneezing; allergies fairly quiet.  Physical Exam  General:  alert and normal appearance.   Eyes:  pupils equal, pupils round, pupils reactive to light, and no optic disk abnormalities.   Ears:  R ear normal and L ear normal.   Mouth:  no erythema, no exudates, and no lesions.   Neck:  supple, no masses, no thyromegaly, no carotid bruits, and no cervical lymphadenopathy.   Breasts:  no masses, no abnormal thickening, no tenderness, and no adenopathy.   Lungs:  normal respiratory effort, no intercostal retractions, no accessory muscle use, and normal breath sounds.  Heart:  normal rate, regular rhythm, no murmur, and no gallop.   Abdomen:  soft, non-tender, and no masses.   Msk:  no joint tenderness and no joint swelling.   Pulses:  normal in feet Extremities:  no edema Neurologic:  alert & oriented X3, strength  normal in all extremities, and gait normal.   Skin:  no rashes, no suspicious lesions, and no ulcerations.   Psych:  normally interactive, good eye contact, not anxious appearing, and not depressed appearing.     Impression & Recommendations:  Problem # 1:  PREVENTIVE HEALTH CARE (ICD-V70.0) Assessment Comment Only healthy will check stool immunoassay wants to wait on mammo till 2 years (next year)  Problem # 2:  ANXIETY (ICD-300.00) Assessment: Unchanged ongoing anxiety and stress dealing with husband no changes needed  Her updated medication list for this problem includes:    Clonazepam 1 Mg Tabs (Clonazepam) .Marland Kitchen... Take 1 1/2  tablet by mouth at bedtime  Problem # 3:  OSTEOARTHRITIS (ICD-715.90) Assessment: Unchanged mild does okay with occ ibuprofen  Problem # 4:  RESTLESS LEG SYNDROME (ICD-333.94) Assessment: Unchanged  clonazepam helps when needed  Orders: TLB-Renal Function Panel (80069-RENAL) TLB-CBC Platelet - w/Differential (85025-CBCD) TLB-Hepatic/Liver Function Pnl (80076-HEPATIC) TLB-TSH (Thyroid Stimulating Hormone) (84443-TSH) Fingerstick ([address])  Problem # 5:  HYPERLIPIDEMIA (ICD-272.4) Assessment: Unchanged  will recheck no Rx though  Labs Reviewed: SGOT: 16 (02/09/2008)   SGPT: 12 (02/09/2008)  Prior 10 Yr Risk Heart Disease: 8 % (11/13/2009)   HDL:51 (12/15/2006)  LDL:136 (12/15/2006)  Chol:217 (12/15/2006)  Trig:151 (12/15/2006)  Orders: TLB-Lipid Panel (80061-LIPID)  Complete Medication List: 1)  Fexofenadine Hcl 30 Mg Tabs (Fexofenadine hcl) .... Take 1 tablet by mouth two times a day as needed 2)  Clonazepam 1 Mg Tabs (Clonazepam) .... Take 1 1/2  tablet by mouth at bedtime  Patient Instructions: 1)  Please schedule a follow-up appointment in 1 year.  2)  Complete your hemoccult cards and return them soon.    Orders Added: 1)  Est. Patient 40-64 years [99396] 2)  TLB-Renal Function Panel [80069-RENAL] 3)  TLB-CBC Platelet -  w/Differential [85025-CBCD] 4)  TLB-Hepatic/Liver Function Pnl [80076-HEPATIC] 5)  TLB-TSH (Thyroid Stimulating Hormone) [84443-TSH] 6)  Fingerstick [36416] 7)  TLB-Lipid Panel [80061-LIPID]    Current Allergies (reviewed today): ! SULFA ! CIPRO ! TETRACYCLINE ! DARVON ! BETADINE ! * DARVOCETTE ! CARBIDOPA-LEVODOPA (CARBIDOPA-LEVODOPA)

## 2010-10-14 NOTE — Progress Notes (Signed)
Summary: refill request for clonazepam  Phone Note Refill Request Message from:  Fax from Pharmacy  Refills Requested: Medication #1:  CLONAZEPAM 1 MG TABS Take 1-3 tablet by mouth at bedtime.   Last Refilled: 02/19/2010 Faxed request from [facility] is on your desk.  Initial call taken by: Lowella Petties CMA,  March 24, 2010 11:04 AM  Follow-up for Phone Call        okay #90 x 0 Follow-up by: Cindee Salt MD,  March 24, 2010 1:56 PM  Additional Follow-up for Phone Call Additional follow up Details #1::        Rx faxed to pharmacy Additional Follow-up by: DeShannon Smith CMA Duncan Dull),  March 24, 2010 2:40 PM    Prescriptions: CLONAZEPAM 1 MG TABS (CLONAZEPAM) Take 1-3 tablet by mouth at bedtime  #90 x 0   Entered by:   Mervin Hack CMA (AAMA)   Authorized by:   Cindee Salt MD   Signed by:   Mervin Hack CMA (AAMA) on 03/24/2010   Method used:   Handwritten   RxID:   [id number]

## 2010-10-14 NOTE — Progress Notes (Signed)
Summary: wants something to help her sleep  Phone Note Call from Patient Call back at [phone]   Caller: Patient Call For: Cindee Salt MD Summary of Call: Patient called crying and upset, saying that she is very stressed. She says that her mother just passed away and that they had her funeral today. She has not been able to sleep in over two weeks. She is asking if she could get something to help her sleep and help her relax in the evenings. She says that she will not take the clonazepam while taking something else, but will understand if you do not feel comftorable doing this. . Uses [facility]. Patient is aware that you are out of the office until tomorrow.  Initial call taken by: Melody Comas,  May 06, 2010 4:41 PM  Follow-up for Phone Call        I am sorry to hear about her mother I cannot give another tranquilizer on top of the clonazepam and that is about as good as it gets to help sleep  Can try ambien (zolpidem)  5mg  1-2 tabs at bedtime as needed for a few days as a change but I will not give an extended Rx #10x 0 Follow-up by: Cindee Salt MD,  May 07, 2010 7:44 AM  Additional Follow-up for Phone Call Additional follow up Details #1::        Rx Called In, spoke with patient and she is scared to take Ambien, pt states she is the only driver in the house and thinks she will have the same reaction with Ambien as she did with the other 2 meds? I will call the pharmacy and cancel, pt states she will try to do without and if she can't she will calll back. I asked pt did she have a medication in mind that she wanted to take and she said "she didn't know" ? pt states she gets up at night and takes a 1/2 of clonazepam now. DeShannon Smith CMA [facility] Dull)  May 07, 2010 9:59 AM  Best not to try anything new Please check on her tomorrow or the next day Cindee Salt MD  May 07, 2010 12:21 PM     New/Updated Medications: ZOLPIDEM TARTRATE 5 MG TABS  (ZOLPIDEM TARTRATE) 1-2 tabs at bedtime as needed Prescriptions: ZOLPIDEM TARTRATE 5 MG TABS (ZOLPIDEM TARTRATE) 1-2 tabs at bedtime as needed  #10 x 0   Entered by:   Mervin Hack CMA (AAMA)   Authorized by:   Cindee Salt MD   Signed by:   Mervin Hack CMA (AAMA) on 05/07/2010   Method used:   Telephoned to ...       [facility]* (retail)       [address] [place], [place]  [address]       Ph: [phone]       Fax: [phone]   RxID:   [id number]

## 2010-10-14 NOTE — Progress Notes (Signed)
Summary: Rx Clonazepam  Phone Note Refill Request Call back at [phone] Message from:  [facility] on Jan 16, 2010 10:25 AM  Refills Requested: Medication #1:  CLONAZEPAM 1 MG TABS Take 1-3 tablet by mouth at bedtime   Last Refilled: 12/17/2009 Received faxed refill request please advise.  Dr. Alphonsus Sias on vacation until Monday.     Method Requested: Telephone to Pharmacy Initial call taken by: Linde Gillis CMA [facility] Dull),  Jan 16, 2010 10:25 AM  Follow-up for Phone Call        Rx called to pharmacy Follow-up by: Linde Gillis CMA [facility] Dull),  Jan 16, 2010 2:13 PM    Prescriptions: CLONAZEPAM 1 MG TABS (CLONAZEPAM) Take 1-3 tablet by mouth at bedtime  #90 x 0   Entered and Authorized by:   Shaune Leeks MD   Signed by:   Shaune Leeks MD on 01/16/2010   Method used:   Telephoned to ...       [facility]* (retail)       [address] [place], [place]  [address]       Ph: [phone]       Fax: [phone]   RxID:   [id number]

## 2010-10-16 NOTE — Letter (Signed)
Summary: Results Follow up Letter  [facility]  [address] [place], [place] [address]   Phone: [phone]  Fax: [phone]    09/22/2010 MRN: [id number]  [facility] [address] [place], [place]  [address]  Dear Ms. Greb,  The following are the results of your recent test(s):  Test         Result    Pap Smear:        Normal _____  Not Normal _____ Comments: ______________________________________________________ Cholesterol: LDL(Bad cholesterol):         Your goal is less than:         HDL (Good cholesterol):       Your goal is more than: Comments:  ______________________________________________________ Mammogram:        Normal _____  Not Normal _____ Comments:  ___________________________________________________________________ Hemoccult:        Normal __X___  Not normal _______ Comments:  Stool test showed no evidence of blood .We will plan to repeat this again next year   _____________________________________________________________________ Other Tests:    We routinely do not discuss normal results over the telephone.  If you desire a copy of the results, or you have any questions about this information we can discuss them at your next office visit.   Sincerely,      Tillman Abide, MD

## 2010-10-16 NOTE — Progress Notes (Signed)
Summary: IFOB was lost in mail   Phone Note Call from Patient Call back at [phone]   Caller: Patient Call For: Cindee Salt MD Summary of Call: Patient mailed out her IFOB kit and the lab has been having issue with the postal service. Patien't kit was lost in the mail and she is very unhappy about having to do again. She is asking if it is absolutley necessary to do it again of if she can wait and do it at her next years [facility]. She also said that if she must do this again she would like to just pick it up in january after the holidays. Please advise.  Initial call taken by: Melody Comas,  August 27, 2010 11:38 AM  Follow-up for Phone Call        I mailed another kit to her. She will return it on Jan 10,2012 Follow-up by: Mills Koller,  August 27, 2010 11:49 AM  Additional Follow-up for Phone Call Additional follow up Details #1::        that sounds fine she really should do it again and I am very sorry about the inconvenience Additional Follow-up by: Cindee Salt MD,  August 27, 2010 1:54 PM

## 2010-11-05 ENCOUNTER — Telehealth: Payer: Self-pay | Admitting: Internal Medicine

## 2010-11-07 ENCOUNTER — Encounter: Payer: Self-pay | Admitting: Cardiovascular Disease

## 2010-11-11 ENCOUNTER — Emergency Department: Payer: Self-pay

## 2010-11-11 ENCOUNTER — Encounter: Payer: Self-pay | Admitting: Cardiovascular Disease

## 2010-11-11 ENCOUNTER — Telehealth: Payer: Self-pay | Admitting: Internal Medicine

## 2010-11-11 NOTE — Progress Notes (Signed)
Summary: requests ear drops  Phone Note Refill Request Call back at [phone] Message from:  Patient  Refills Requested: Medication #1:  cortisporin otic susp Pt states her left ear has been hurting for 2 days, no other sxs.  She said she has had this before and the cortisporin helped.  She doesnt want to come in, she says she can hear fine.  She said if medicine wont be called in she will just put up with the pain.  Uses [facility].  Initial call taken by: Lowella Petties CMA, AAMA,  November 05, 2010 9:30 AM  Follow-up for Phone Call        we can prescribe auralgan pain drops without a visit I sent it to [facility] needs appt if persists Follow-up by: Cindee Salt MD,  November 05, 2010 1:43 PM  Additional Follow-up for Phone Call Additional follow up Details #1::        Rx faxed to pharmacy, Spoke with patient and advised results.  Additional Follow-up by: Mervin Hack CMA Duncan Dull),  November 05, 2010 3:59 PM    New/Updated Medications: ANTIPYRINE-BENZOCAINE 5.4-1.4 % SOLN (BENZOCAINE-ANTIPYRINE) 5 drops in ear every 2-3 hours as needed for pain Prescriptions: ANTIPYRINE-BENZOCAINE 5.4-1.4 % SOLN (BENZOCAINE-ANTIPYRINE) 5 drops in ear every 2-3 hours as needed for pain  #1 bottle x 0   Entered and Authorized by:   Cindee Salt MD   Signed by:   Cindee Salt MD on 11/05/2010   Method used:   Electronically to        [facility]* (retail)       [address] [place], [place]  [address]       Ph: [phone]       Fax: [phone]   RxID:   [id number]

## 2010-11-12 ENCOUNTER — Encounter: Payer: Self-pay | Admitting: Cardiovascular Disease

## 2010-11-12 ENCOUNTER — Ambulatory Visit (INDEPENDENT_AMBULATORY_CARE_PROVIDER_SITE_OTHER): Payer: BC Managed Care – PPO | Admitting: Cardiovascular Disease

## 2010-11-12 DIAGNOSIS — I10 Essential (primary) hypertension: Secondary | ICD-10-CM

## 2010-11-12 DIAGNOSIS — E785 Hyperlipidemia, unspecified: Secondary | ICD-10-CM

## 2010-11-13 ENCOUNTER — Telehealth: Payer: Self-pay | Admitting: Cardiovascular Disease

## 2010-11-18 ENCOUNTER — Telehealth: Payer: Self-pay | Admitting: Family Medicine

## 2010-11-20 ENCOUNTER — Encounter: Payer: Self-pay | Admitting: Cardiovascular Disease

## 2010-11-20 NOTE — Progress Notes (Signed)
Summary: BP elevated  Phone Note Call from Patient Call back at Home Phone [phone]   Caller: Patient Call For: Cindee Salt MD Summary of Call: Patient says that she is having jaw pain, arm feels heavy,  her blood pressure is 218/122. Per Dr. Milinda Antis and Dr. Reece Agar patient should go to ER. Patient refused advise. She said that she is worried about stressing Jonny Ruiz out. I then got Geraldine Contras to call and talk with her and she told Geraldine Contras that she would call us when she decided what to do.  Initial call taken by: Melody Comas,  November 11, 2010 3:27 PM  Follow-up for Phone Call        pt stated she didn't want to go to [facility] because she hated that hospital, pt wanted to be seen here but per Dr.Tower, Dr.G and the fire dept pt needed to go to the ER. Pt stated she has John and wasn't going to the ER, per pt she didn't know what she was going to do. Pt wanted to go over to [facility] to be seen and I advised they didn't see walk-in's, per pt " why can't you call and tell them I'm coming"? I advised pt they take referrals and they are a office just like ours and have appts, pt stated again she didn't know what she was going to do. I could hear the fire dept in the background tell her she needed to go to the ER and they would take her. Pt stated if she did go it would be [facility], pt stated she was very upset with [place] and hung up. DeShannon Katrinka Blazing CMA Duncan Dull)  November 11, 2010 4:26 PM    Please check on her this Am If she refused evaluation yesterday, can add on this afternoon for me Cindee Salt MD  November 12, 2010 7:44 AM   spoke with patient and she did go to [facility], pt states her BP was 236/114 when she got there, per pt she had IV of Labetol 10mg       ( bp medication), they did EKG, chest x-ray and CT scan and all was normal. Pt states [facility] wanted her to follow-up with Dr.Hochrein. Pt wanted to know if she should call them or [place]? also they forgot to give her the rx for Labetol and they  won't fax it to [facility], can we fax per pt? also pt states the 100mg  made her sick, they gave her one when she left, can this be cut in half? Please advise. DeShannon Smith CMA [facility] Dull)  November 12, 2010 8:50 AM    Have her go to Dr Antoine Poche today since appt available leave Rx to him Cindee Salt MD  November 12, 2010 8:58 AM   Spoke with patient and advised results. Pt will try and make the 10:45 appt. Follow-up by: Mervin Hack CMA Duncan Dull),  November 12, 2010 9:18 AM

## 2010-11-20 NOTE — Progress Notes (Signed)
Summary: BP questions  Phone Note Call from Patient   Caller: Patient Call For: Nurse Summary of Call: pt called asking questions about amlodipine. She wanted to know if she needed to take more of the medication because her BP is still high after her medication. BP is 153/92 after taking medication. Notified pt only take 1 tablet daily and keep monitoring her BP and HR. Pt notified she needs to let us know next week what her readings are. pt verbalized understanding and will call next week with readings. Initial call taken by: Lysbeth Galas CMA,  November 13, 2010 3:21 PM

## 2010-11-20 NOTE — Assessment & Plan Note (Signed)
Summary: New pt; referred by ED at [facility] see on 11/11/2010 hypertension ...   Visit Type:  Initial Consult Primary Provider:  Cindee Salt MD  CC:  F/U [facility].Marland Kitchen  History of Present Illness: 65 year old woman with a history of anxiety, hyperlipidemia who presents after being evaluated in the emergency room last night for significant hypertension.   She reports that over the past month, she has had tingling in her left arm. Sometimes it feels heavy. She has continued to work out at [employer] several times a week and does walking daily. No significant shortness of breath or chest pain.  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 This showed elevated blood pressure. She went to the emergency room and they recorded a systolic pressure greater than 200. They gave her labetalol 10 mg IV and labetalol p.o.. This may her feel "out of it "and she did not fill the prescription.  She presents today and reports that she is feeling better though tired after being in the emergency room yesterday. Her tingling in her left arm is gone  EKG in the emergency room showed normal sinus rhythm with rate 81 beats per minute with no significant ST or T wave changes  Labwork in the emergency room showed normal estimate about panel, normal LFTs, normal cardiac enzyme  EKG today shows normal sinus rhythm with rate 57 beats per minute with no significant ST or T wave changes, QTC 492 ms  Current Medications (verified): 1)  Fexofenadine Hcl 30 Mg Tabs (Fexofenadine Hcl) .... Take 1 Tablet By Mouth Two Times A Day As Needed 2)  Clonazepam 1 Mg Tabs (Clonazepam) .... Take 1 1/2  Tablet By Mouth At Bedtime 3)  Antipyrine-Benzocaine 5.4-1.4 % Soln (Benzocaine-Antipyrine) .... 5 Drops in Ear Every 2-3 Hours As Needed For Pain  Allergies (verified): 1)  ! Sulfa 2)  ! Cipro 3)  ! Tetracycline 4)  ! Darvon 5)  ! Betadine 6)  ! * Darvocette 7)  !  Carbidopa-Levodopa (Carbidopa-Levodopa)  Past History:  Past Medical History: Last updated: 08/13/2010 Allergic rhinitis Anxiety Fibrocystic breasts Hyperlipidemia--mild Restless legs syndrome Migraines Osteoarthritis  Past Surgical History: Last updated: 02/09/2008 1974  Tonsils/adenoids 1981 Hysterectomy/BSO 6/96  Negative stress echo 1999 Cholecystectomy/appendix 5/01 Abdominal wall lipoma  Family History: Last updated: 02/09/2008 Mom with CHF, AAA, DM Dad died with CHF, CAD @84  1 brother shot 1 brother still living ! 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 would accept resuscitation Wouldn't want nutritional support for vegetative state  Risk Factors: Smoking Status: never (02/09/2008)  Review of Systems  The patient denies fever, weight loss, weight gain, vision loss, decreased hearing, hoarseness, chest pain, syncope, dyspnea on exertion, peripheral edema, prolonged cough, abdominal pain, incontinence, muscle weakness, depression, and enlarged lymph nodes.         left arm tingling that waxes and wanes  Vital Signs:  Patient profile:   65 year old female Height:      68 inches Weight:      175 pounds BMI:     26.70 Pulse rate:   58 / minute BP sitting:   167 / 88  (left arm) Cuff size:   regular  Vitals Entered By: Jasmine December  Johnna Acosta, CMA (November 12, 2010 11:24 AM)  Physical Exam  General:  Well developed, well nourished, in no acute distress. Head:  normocephalic and atraumatic Neck:  Neck supple, no JVD. No masses, thyromegaly or abnormal cervical nodes. Lungs:  Clear bilaterally to auscultation and percussion. Heart:  Non-displaced PMI, chest non-tender; regular rate and rhythm, S1, S2 without murmurs, rubs or gallops. Carotid  upstroke normal, no bruit.  Pedals normal pulses. No edema, no varicosities. Abdomen:  Bowel sounds positive; abdomen soft and non-tender without masses Msk:  Back normal, normal gait. Muscle strength and tone normal. Pulses:  pulses normal in all 4 extremities Extremities:  No clubbing or cyanosis. Neurologic:  Alert and oriented x 3. Skin:  Intact without lesions or rashes. Psych:  Normal affect.   Impression & Recommendations:  Problem # 1:  HYPERTENSION, BENIGN (ICD-401.1) blood pressure is elevated today, 150/90 on the left, 145/85 on the right. We have suggested she buy a blood pressure cuff, record her blood pressure at home. If she continues to have systolic pressures greater than 140 on a regular basis, she should start amlodipine 5 mg daily. She will write down her blood pressures and give them to myself or Dr. Alphonsus Sias.  Her updated medication list for this problem includes:    Amlodipine Besylate 10 Mg Tabs (Amlodipine besylate) .Marland Kitchen... Take one tablet by mouth daily    Aspirin 81 Mg Tbec (Aspirin) .Marland Kitchen... Take one tablet by mouth daily  Problem # 2:  HYPERLIPIDEMIA (ICD-272.4) We did talk about her cholesterol. she is interested in starting a low-dose cholesterol medication. We have suggested she try pravastatin 20 mg daily for one month with titration up to 40 mg daily if no significant side effects including myalgias. Check of the cholesterol and LFTs in 3 months.  Her updated medication list for this problem includes:    Pravastatin Sodium 40 Mg Tabs (Pravastatin sodium) .Marland Kitchen... Take one tablet by mouth daily at bedtime  Problem # 3:  PREVENTIVE HEALTH CARE (ICD-V70.0) we have suggested she start aspirin 81 mg x1 Continue her exercises she is doing.  Problem # 4:  ANXIETY (ICD-300.00) uncertain if anxiety is playing a role in her recent hypotensive episode. We will defer this to Dr. Alphonsus Sias  Patient Instructions: 1)  Your physician recommends that you schedule a follow-up  appointment in: 1 month 2)  Your physician has recommended you make the following change in your medication: START Amlodipine 10mg  1/2 tablet AFTER checking BP for a couple of days and if elevated. START Pravastatin 40mg  1/2 tablet once daily. 3)  Your physician has requested that you regularly monitor and record your blood pressure readings at home.  Please use the same machine at the same time of day to check your readings and record them to bring to your follow-up visit. Prescriptions: PRAVASTATIN SODIUM 40 MG TABS (PRAVASTATIN SODIUM) Take one tablet by mouth daily at bedtime  #30 x 6   Entered by:   Lanny Hurst RN   Authorized by:   Dossie Arbour MD   Signed by:   Lanny Hurst RN on 11/12/2010   Method used:   Electronically to        [facility]* (retail)       [address] [place], [place]  [address]       Ph: [phone]       Fax: [phone]   RxID:  [id number] CRESTOR 40 MG TABS (ROSUVASTATIN CALCIUM) Take one tablet by mouth daily.  #30 x 6   Entered by:   Lanny Hurst RN   Authorized by:   Dossie Arbour MD   Signed by:   Lanny Hurst RN on 11/12/2010   Method used:   Electronically to        [facility]* (retail)       [address] [place], [place]  [address]       Ph: [phone]       Fax: [phone]   RxID:   [id number] AMLODIPINE BESYLATE 10 MG TABS (AMLODIPINE BESYLATE) Take one tablet by mouth daily  #30 x 6   Entered by:   Lanny Hurst RN   Authorized by:   Dossie Arbour MD   Signed by:   Lanny Hurst RN on 11/12/2010   Method used:   Electronically to        [facility]* (retail)       [address] [place], [place]  [address]       Ph: [phone]       Fax: [phone]   RxID:   [id number]

## 2010-11-25 NOTE — Progress Notes (Signed)
Summary: clonazepam  Phone Note Refill Request Message from:  Fax from Pharmacy on November 18, 2010 11:02 AM  Refills Requested: Medication #1:  CLONAZEPAM 1 MG TABS Take 1 1/2  tablet by mouth at bedtime   Last Refilled: 10/21/2010 Refill request from [facility]. [phone]  Initial call taken by: Melody Comas,  November 18, 2010 11:03 AM  Follow-up for Phone Call        please call in and have other refills come through Dr. Alphonsus Sias.  thanks. Crawford Givens MD  November 18, 2010 1:32 PM   Medication phoned to pharmacy. Lugene Fuquay CMA (AAMA)  November 18, 2010 2:18 PM     Prescriptions: CLONAZEPAM 1 MG TABS (CLONAZEPAM) Take 1 1/2  tablet by mouth at bedtime  #45 x 0   Entered and Authorized by:   Crawford Givens MD   Signed by:   Crawford Givens MD on 11/18/2010   Method used:   Telephoned to ...       [facility]* (retail)       [address] [place], [place]  [address]       Ph: [phone]       Fax: [phone]   RxID:   [id number]

## 2010-11-25 NOTE — Letter (Addendum)
Summary: At Home BP Readings  At Home BP Readings   Imported By: Harlon Flor 11/21/2010 09:48:08  _____________________________________________________________________  External Attachment:    Type:   Image     Comment:   External Document  Appended Document: At Home BP Readings Pt's BP readings after starting Amlodipine, started 5mg , now taking 10mg  once daily.  Appended Document: At Home BP Readings First message incorrect. Spoke to pt, she started with 1/2 tablet (5mg ) and on one occurence she took 1/2 tablet twice within about 4 hours of each other and SBP dropped to 84. Pt has recently been taking only 1/4 tablet. After reviewing BP's, advised pt that she take 1/2 tablet daily consistently because after taking 1/4 tablet her BP's are still elevated but it looks like 1 whole tablet (10mg ) is too much. Advised pt to also document BP numbers for 1 more week and bring to office end of next week so we can see if any change after taking 1/2 tablet once daily.

## 2010-12-02 NOTE — Letter (Signed)
[facility]   [facility]   Imported By: Roderic Ovens 11/27/2010 12:13:13  _____________________________________________________________________  External Attachment:    Type:   Image     Comment:   External Document

## 2010-12-02 NOTE — Letter (Addendum)
Summary: At Home BP Readings  At Home BP Readings   Imported By: Harlon Flor 11/28/2010 12:18:03  _____________________________________________________________________  External Attachment:    Type:   Image     Comment:   External Document  Appended Document: At Home BP Readings Pt's BP readings after recent ov with Dr. Mariah Milling, pt was started on Amlodipine 10mg  and told to take 1/2 tablet if after taking BP remained elevated. Pt started out taking 1/4 pill and is now taking 1/2 tablet (5mg ). Preliminarily reviewed. Forwarded to MD desktop for review and signature /MES  Appended Document: At Home BP Readings BP better on a 1/2 pill at night. May need to add a 1/4 pill in the AM as well as BP high in the PM  Appended Document: At Home BP Readings LMOM TCB/sab  Appended Document: At Home BP Readings notified patient of instructions on blood pressure.

## 2010-12-15 ENCOUNTER — Other Ambulatory Visit: Payer: Self-pay | Admitting: *Deleted

## 2010-12-16 ENCOUNTER — Telehealth: Payer: Self-pay | Admitting: *Deleted

## 2010-12-16 MED ORDER — CLONAZEPAM 1 MG PO TABS: ORAL_TABLET | ORAL | Status: DC

## 2010-12-16 NOTE — Telephone Encounter (Signed)
Called pt to notify her that BP results she dropped off looked great per Dr. Mariah Milling. LMOM TCB.

## 2010-12-16 NOTE — Telephone Encounter (Signed)
Okay #45 x 2

## 2010-12-16 NOTE — Telephone Encounter (Signed)
Paper rx faxed to pharmacy

## 2010-12-16 NOTE — Telephone Encounter (Signed)
Pt called back and notified of msg.

## 2011-01-05 ENCOUNTER — Telehealth: Payer: Self-pay | Admitting: *Deleted

## 2011-01-05 NOTE — Telephone Encounter (Signed)
Patient is asking if she can come in to have her cholesterol checked. If so she would like to do it the same day as her husband's appt to see you which is May 29 th. Please advise.

## 2011-01-05 NOTE — Telephone Encounter (Signed)
Okay to check lipid and hepatic Is now on pravastatin  Dx 272.4

## 2011-01-06 NOTE — Telephone Encounter (Signed)
Left message on machine that it's ok to have cholesterol checked when she comes in with her husband on May 29th, will schedule appt same time as his.

## 2011-01-14 ENCOUNTER — Telehealth: Payer: Self-pay | Admitting: *Deleted

## 2011-01-14 MED ORDER — CLONAZEPAM 1 MG PO TABS: ORAL_TABLET | ORAL | Status: DC

## 2011-01-14 NOTE — Telephone Encounter (Signed)
Pt wants directions on her clorazepam script changed back to take 1 to 3 at bedtime, because she sometimes takes 2 whole pills during the night, and current script is for one and a half at night.  She is running out early and is unable to get a refill.  She needs a new script called to [facility].  States sometimes she gets by with less, it just depends on her restless leg syndrome.

## 2011-01-14 NOTE — Telephone Encounter (Signed)
rx called into pharmacy, Spoke with patient and advised results   

## 2011-01-14 NOTE — Telephone Encounter (Signed)
Okay to change to 1-3 at bedtime #90 x 0

## 2011-01-27 ENCOUNTER — Other Ambulatory Visit: Payer: Self-pay | Admitting: Internal Medicine

## 2011-01-27 DIAGNOSIS — E785 Hyperlipidemia, unspecified: Secondary | ICD-10-CM

## 2011-01-28 ENCOUNTER — Telehealth: Payer: Self-pay | Admitting: Internal Medicine

## 2011-01-28 ENCOUNTER — Other Ambulatory Visit (INDEPENDENT_AMBULATORY_CARE_PROVIDER_SITE_OTHER): Payer: Medicare Other | Admitting: Internal Medicine

## 2011-01-28 DIAGNOSIS — E785 Hyperlipidemia, unspecified: Secondary | ICD-10-CM

## 2011-01-28 LAB — LIPID PANEL
Cholesterol: 155 mg/dL (ref 0–200)
Triglycerides: 129 mg/dL (ref 0.0–149.0)

## 2011-01-28 LAB — HEPATIC FUNCTION PANEL
ALT: 21 U/L (ref 0–35)
AST: 19 U/L (ref 0–37)
Albumin: 3.8 g/dL (ref 3.5–5.2)
Total Bilirubin: 0.3 mg/dL (ref 0.3–1.2)
Total Protein: 6.7 g/dL (ref 6.0–8.3)

## 2011-01-28 NOTE — Telephone Encounter (Signed)
Please call Labs are fine Chol at goal with total of 155 and LDL or bad chol of 75 Liver tests are normal Continue with the half tablet of pravastatin

## 2011-01-29 NOTE — Telephone Encounter (Signed)
Spoke with patient and advised results   

## 2011-01-30 NOTE — Op Note (Signed)
[place]. [facility]  Patient:    Toni Berger, Toni Berger                         MRN: [id number] Proc. Date: 01/15/00 Adm. Date:  [id number] Disc. Date: [id number] Attending:  Arlis Porta CC:         Dr. Aram Beecham                           Operative Report  PREOPERATIVE DIAGNOSIS:  Painful abdominal wall mass.  POSTOPERATIVE DIAGNOSIS:  Soft tissue abdominal wall mass.  PROCEDURE:  Excision of abdominal wall mass measuring 2.5 cm.  SURGEON:  Adolph Pollack, M.D.  ANESTHESIA:  Local with MAC.  INDICATIONS:  The patient is a 65 year old female who began having some abdominal wall pain after lifting up some heavy papers at work.  On examination, she has  palpable soft tissue mass in the right lower quadrant area that is tender. There is some question of whether this is herniation of extraperitoneal contents through spigelian hernia, or just a painful abdominal wall mass.  She now presents for excisional repair.  DESCRIPTION OF PROCEDURE:  She was placed supine on the operating table.  The abdominal wall mass was marked.  She was then given intravenous sedation.  The rea was sterilely prepped with Hibiclens and draped.  Local anesthetic consisting of 1% lidocaine and epinephrine plus 0.5% plain Marcaine, plus sodium bicarbonate was  infiltrated in a transverse fashion directly over the mass.  The skin and subdermis were incised sharply.  The subcutaneous fat was divided with the cautery. Using blunt dissection, I removed a lipomatous-appearing soft tissue mass that measured 2.5 cm in diameter.  This was sent for pathology for histologic diagnosis.  I then inspected both the anterior muscle wall and did not note any defects.  When hemostasis was adequate, I closed the Scarpas fascia with a running 3-0 Vicryl suture.  The skin was closed with a 4-0 Monocryl subcuticular stitch, followed y Steri-Strips and sterile dressings.  She  tolerated the procedure well without any apparent complications and was taken to the recovery room in satisfactory condition. DD:  01/15/00 TD:  01/19/00 Job: [id number]

## 2011-02-10 ENCOUNTER — Telehealth: Payer: Self-pay

## 2011-02-10 ENCOUNTER — Other Ambulatory Visit: Payer: BC Managed Care – PPO

## 2011-02-10 NOTE — Telephone Encounter (Signed)
Pt states that the past 2 weeks she has had swelling in her legs, she started Amlodipine at the end of 10/2010. Pt takes 5mg  daily. Pt also has had symptoms of HA and has been taking Advil that does help. Notified pt this may be s/e of Amlodipine and will discuss with Dr. Mariah Milling and call her back. Pt does not take any other BP med.

## 2011-02-10 NOTE — Telephone Encounter (Signed)
Amlodipine 10 mg takes 1/2 tablet daily.  Blood pressure started increasing on Feb 07, 2011 with readings 133/90 heart rate 67, 150/101 heart rate 66, 136/98 she had taken blood pressure last night 145/88 heart rate 73. Patient c/o a terrible headache and swelling for a couple of weeks and feeling tired.  Please call with what to do?  Cell phone [phone]

## 2011-02-11 ENCOUNTER — Other Ambulatory Visit: Payer: Self-pay

## 2011-02-11 MED ORDER — LISINOPRIL-HYDROCHLOROTHIAZIDE 20-12.5 MG PO TABS: 1.0000 | ORAL_TABLET | Freq: Every day | ORAL | Status: DC

## 2011-02-11 NOTE — Telephone Encounter (Signed)
Uncertain why she has headache, Could try alternate BP medication to see if this helps. Leg swelling could be from amlodipine. Could try lisinopril/HCT 20/12.5 mg daily to start, go up to higher dose if needed.

## 2011-02-11 NOTE — Telephone Encounter (Signed)
Notified patient need to start on lisinopril/hct 20/12.5 mg take one tablet daily.  Needs to have called to [facility].

## 2011-02-11 NOTE — Telephone Encounter (Signed)
Patient was instructed to stop the amlodipine and start on lisinopril 20/12.5 mg take one tablet daily with keeping a record of blood pressure readings and heart rate.

## 2011-02-12 ENCOUNTER — Telehealth: Payer: Self-pay | Admitting: *Deleted

## 2011-02-12 MED ORDER — LISINOPRIL 40 MG PO TABS: 40.0000 mg | ORAL_TABLET | Freq: Every day | ORAL | Status: DC

## 2011-02-12 NOTE — Telephone Encounter (Signed)
Pt called yesterday stating that she cannot take the lisinopril/hct that was prescribed because she is severely allergic to sulfonamide derivatives. Dr. Mariah Milling aware. Notified pt that we can send in just the lisinopril alone 40mg  and she can take 1/2 tablet to start and monitor BP. Reviewed common s/e of drug with pt, pt states she will take this and call us back in 1 week with BP numbers.

## 2011-02-18 ENCOUNTER — Telehealth: Payer: Self-pay | Admitting: Emergency Medicine

## 2011-02-18 NOTE — Telephone Encounter (Signed)
Pt called in today stating that her BP has still been running high. Pt is taking 1/2 pill of Lisinopril at bedtime.Today she took BP 9am- 133/91 and 3pm BP has been running 168/96, she took a extra 1/4 tablet this morning and she is not sure if it has worked. She wanted to ask you if she took a whole tablet would it make BP come down to low? She is afraid that if her BP runs too low that she can not take care of her husband. Also advised patient to drop off her BP readings by our office tomorrow.

## 2011-02-19 ENCOUNTER — Telehealth: Payer: Self-pay | Admitting: *Deleted

## 2011-02-19 NOTE — Telephone Encounter (Signed)
Also notified patient that sometimes when you check your BP it can make it go up also. She stated that her medication isn't working and she has been taking extra 1/4 of tablet when she feels like she needs more medication. She took over 1.25 tablets last night.

## 2011-02-19 NOTE — Telephone Encounter (Signed)
Pt has called back again this morning and is frustrated because she states that her BP is running high. Last night she took her BP 3 times within a 2 hour period. Her BP was running 150/101 and 165/95, pulse has been running around 55-68. I again tried to tell her that she needs to bring her BP readings by our office today so you can take a look at them to see if she needs to change anything. She is "unsure if she can drop them by our office but she will try".

## 2011-02-19 NOTE — Telephone Encounter (Signed)
LMOM TCB. Spoke to [facility] and he recommends that she take a full tablet of Lisinopril. The trend of her BP looks good minus the one day. He wants her to try taking a full dose of Lisinopril and she should take her BP twice a day. She is to bring a copy of her BP readings to our office in a week. If this doesn't work he said he may have to add another BP medication.

## 2011-02-19 NOTE — Telephone Encounter (Signed)
Pt notified/sab 

## 2011-02-19 NOTE — Telephone Encounter (Signed)
Patient is asking if you could call her regarding her blood pressure. She says that the lisinopril is not working and wants you advise.

## 2011-02-19 NOTE — Telephone Encounter (Signed)
Amlodipine caused ankle edema Had combination with HCTZ which she couldn't tolerate with her sulfa allergy  Now on lisinopril 40mg  1/2 daily Systolic BPs 140-150s  Dr Mariah Milling advised that she try increasing to a full pill She wants my opinion  I told her to increase the dose to 40mg  at night or 20 bid She is monitoring it still No dizziness or cough on the lisinopril

## 2011-02-24 ENCOUNTER — Telehealth: Payer: Self-pay | Admitting: *Deleted

## 2011-02-24 NOTE — Telephone Encounter (Signed)
Pt has been taking lisinopril 40 mg's, one half twice a day since 5/31.  Her BP isnt going down.  It was 149/100 yesterday morning before she took her pill. Yesterday evening it was 157/94, this morning 144/102, this afternoon her arm started feeling strange so she took her BP again and it was 178/114- ten minutes later it was 189/114.  She says something needs to be done, says the other medicine worked better but it made her ankles swell.  She is asking if she should try taking another half pill to see if that will decrease BP.  Dr. Alphonsus Sias is gone for the day.  Please advise.  She states she feels fine otherwise, no chest pain or shortness of breath.

## 2011-02-24 NOTE — Telephone Encounter (Signed)
Advised pt, she will follow up with Dr. Alphonsus Sias.

## 2011-02-24 NOTE — Telephone Encounter (Signed)
Would not recommend taking another 1/2 tablet.  Push fluids, cut back on sodium intake (salt) to <1.5gm/day, increase fruits and vegetables for potassium. Sounds like she will need an additional medicine to control blood pressure.  Will defer to PCP.  May need office visit. In meantime, ensure taking blood pressure at least 30 min after eating anything, drinking anything, caffeine, smoking, EtOH, painful stimuli as all these things can cause her bp to go up.

## 2011-02-25 NOTE — Telephone Encounter (Signed)
It is very possible that her anxiety about this and the frequent BP checks are causing an adrenaline reaction which is falsely elevating her BP  She should not further increase her med now Needs appt within the next couple of weeks to review Bring her cuff in and written records of what her blood pressure is Elevated BP causes damage over years---not immediately.  She needs to relax a little and we will make decisions together about treatment

## 2011-02-25 NOTE — Telephone Encounter (Signed)
Patient scheduled appt for Monday 03/02/2011, I offered appt with another physician, per pt she only wants to see Dr.Letvak face to face. Pt stated if anything happens to her over the weekend she will go to [facility], "that way [facility] cardiology can come take care of me", I advised pt we can't be sure that a [place] physician would be the one "caring" for her IF she went to [facility]. Pt stated she will go to [facility] then, I advised pt that we have physicians that will see her at our office and also we have on-call physicians on the weekends and nights. Per pt she will wait to see Dr.Letvak.

## 2011-02-25 NOTE — Telephone Encounter (Signed)
Please see if she is willing to see another doctor in the office, otherwise I am not back till Monday

## 2011-02-25 NOTE — Telephone Encounter (Signed)
Pt called back, says she just got out of bed and her BP is 158/117.  She says she is not anxious, has not been checking her blood pressure that often, she did yesterday because her arm was feeling strange.  She says if you cant see her today she will have to find another doctor.

## 2011-02-25 NOTE — Telephone Encounter (Signed)
Pt called back again, insists that she is not anxious, started worrying yesterday when her arm started feeling numb.  She says she knows she can have a stroke or a heart attack when her BP spikes.  She says she is not anxious, she is angry.  Says she is getting anxious now because she is not able to see you today.  She says she did take an extra 1/4 of lisinopril yesterday afternoon and last night because her BP wasn't going down.  Says it would not have gone down if she had not taken the extra pills. Says the pills arent working. She has not taken any this morning, I advised her to take her 1/2 tablet dose.  (Her afternoon yesterday- 2:45 BP was 189/114, took an extra 1/4 pill, at 5:10 BP was 172/102, at 10:00 last night BP was 163/107, pulse 96- she took her one half pill dose plus an extra 1/4 tablet.)

## 2011-02-26 ENCOUNTER — Encounter: Payer: Self-pay | Admitting: Internal Medicine

## 2011-02-26 NOTE — Telephone Encounter (Signed)
We do have [place] cardiologists at [facility] for consultation all the time. If anything happens and she winds up there, she can ask for them I will see her on Monday

## 2011-03-02 ENCOUNTER — Ambulatory Visit (INDEPENDENT_AMBULATORY_CARE_PROVIDER_SITE_OTHER): Payer: Medicare Other | Admitting: Internal Medicine

## 2011-03-02 ENCOUNTER — Encounter: Payer: Self-pay | Admitting: Internal Medicine

## 2011-03-02 VITALS — BP 165/80 | HR 60 | Temp 98.1°F | Ht 68.0 in | Wt 179.0 lb

## 2011-03-02 DIAGNOSIS — I10 Essential (primary) hypertension: Secondary | ICD-10-CM

## 2011-03-02 MED ORDER — METOPROLOL SUCCINATE ER 50 MG PO TB24: 50.0000 mg | ORAL_TABLET | Freq: Every day | ORAL | Status: DC

## 2011-03-02 NOTE — Patient Instructions (Signed)
Please start the metoprolol at 1/2 tablet daily for 6 days. If no problems then, please increase to a full tablet

## 2011-03-02 NOTE — Assessment & Plan Note (Signed)
BP Readings from Last 3 Encounters:  03/02/11 165/80  11/12/10 167/88  08/13/10 122/78   Control was good on amlodipine but limited by edema Doesn't want diuretic (No HCTZ due to sulfa allergy and "I don't want any fluid pill") Discussed alternatives Will add beta blocker

## 2011-03-02 NOTE — Progress Notes (Signed)
  Subjective:    Patient ID: Toni Berger, female    DOB: 20-Dec-1945, 65 y.o.   MRN: [id number]  HPI Very concerned about her BP Noted swelling with amlodipine so she was changed to lisinopril The amlodipine did work though Was taking lisinopril 20mg  daily but then increased to 20mg  bid about 11 days ago BPs still 130's-170's/90s-100's Did have one reading of 189/114  Has tingling in her left arm at times No chest pain No SOb Has headaches--close to daily. Takes advil and it helps (as much as 3 a day)  Current Outpatient Prescriptions on File Prior to Visit  Medication Sig Dispense Refill  . aspirin 81 MG tablet Take 81 mg by mouth daily.        . clonazePAM (KLONOPIN) 1 MG tablet Take 1-3 tablets by mouth at bedtime as needed.  90 tablet  0  . lisinopril (PRINIVIL,ZESTRIL) 40 MG tablet Take 1 tablet (40 mg total) by mouth daily.  30 tablet  11  . pravastatin (PRAVACHOL) 40 MG tablet Take 40 mg by mouth daily.         Past Medical History  Diagnosis Date  . Allergy   . Anxiety   . Hyperlipidemia   . Fibrocystic breast   . Restless leg syndrome   . Arthritis   . Migraines   . Lipoma     5/01 Abdominal wall lipoma    Past Surgical History  Procedure Date  . Tonsillectomy and adenoidectomy   . Abdominal hysterectomy   . Cholecystectomy   . Dobutamine stress echo 06/96    negative    Family History  Problem Relation Age of Onset  . Heart disease Mother   . Diabetes Mother   . Cancer Neg Hx     History   Social History  . Marital Status: Married    Spouse Name: N/A    Number of Children: 1  . Years of Education: N/A   Occupational History  . retired Airline pilot for town of [employer]    Social History Main Topics  . Smoking status: Never Smoker   . Smokeless tobacco: Never Used  . Alcohol Use: No  . Drug Use: No  . Sexually Active: Not on file   Other Topics Concern  . Not on file   Social History Narrative   Has Health Care POA-- husband and then  stepson.would accept resuscitationWouldn't want nutritional support for vegetative state   Review of Systems Appetite is fine Sleep is still not good--despite the meds. Seemed to do better when BP was lower    Objective:   Physical Exam  Constitutional: She appears well-developed and well-nourished. No distress.  Neck: Normal range of motion. Neck supple. No thyromegaly present.  Cardiovascular: Normal rate, regular rhythm and normal heart sounds.  Exam reveals no gallop.   No murmur heard. Pulmonary/Chest: Effort normal and breath sounds normal. No respiratory distress. She has no wheezes. She has no rales.  Musculoskeletal: Normal range of motion. She exhibits no edema and no tenderness.  Lymphadenopathy:    She has no cervical adenopathy.  Psychiatric:       Anxious but not depressed          Assessment & Plan:

## 2011-03-03 ENCOUNTER — Telehealth: Payer: Self-pay | Admitting: *Deleted

## 2011-03-03 MED ORDER — CLONAZEPAM 1 MG PO TABS: ORAL_TABLET | ORAL | Status: DC

## 2011-03-03 NOTE — Telephone Encounter (Signed)
Please see note below, pt has called back, she is waiting for response.  Also, [facility] has faxed a refill request for klonopin, fax is on your desk.

## 2011-03-03 NOTE — Telephone Encounter (Signed)
If she feels okay and is not dizzy, that pulse is okay She will not need to increase the metoprolol as I had recommended if the BP stays down like that

## 2011-03-03 NOTE — Telephone Encounter (Signed)
Patient called back and was notified. She was said that she has nothing else to say to [place] and that if she has to go back to the ER she will be at [facility].

## 2011-03-03 NOTE — Telephone Encounter (Signed)
rx faxed to pharmacy manually  

## 2011-03-03 NOTE — Telephone Encounter (Signed)
Pt states she took one half lisinipril and one half metoprolol at 10 last night.  She checked her BP at 9:30 this morning and it was 142/90, pulse was 48- which she is concerned about.  Feels ok otherwise.  Please advise on whether you think pulse is too low.

## 2011-03-04 ENCOUNTER — Telehealth: Payer: Self-pay | Admitting: *Deleted

## 2011-03-04 NOTE — Telephone Encounter (Signed)
Pt called this AM regarding continue elevated BP. (Pt has long history of difficulty trying new medications due to "allergies and intolerances"). Most recent change by Dr. Mariah Milling was adding lisinopril 40mg , starting with 1/2 then incr to 1 tab daily. Pt states that she has never taken whole tablet at once, she takes 1/2 tab AM and PM. Pt recently saw Dr. Alphonsus Sias and Metoprolol XL 50mg  qd was started. Pt states after taking her HR was 42, and this concerned her. Pt denied any symptoms of dizzines, etc. Explained to pt (as we have in past) not to take BP/HR too frequently as this can incr anxiety and cause BP to incr as well. Told pt if she did not have any accompanying symptoms with low HR not to worry with the 'number'. Pt states she does not want to take any beta-blocker. I have advised pt to try taking lisinopril 40mg  in AM, and only take BP once more that day, record numbers and bring with her to appt. I have scheduled pt to see TG in office next week. Per Dr. Mariah Milling, if pt continues to call with problems with BP and trouble with starting new medications to schedule her to see him in office. Pt ok with this.

## 2011-03-05 ENCOUNTER — Ambulatory Visit (INDEPENDENT_AMBULATORY_CARE_PROVIDER_SITE_OTHER): Payer: Medicare Other | Admitting: Cardiovascular Disease

## 2011-03-05 ENCOUNTER — Encounter: Payer: Self-pay | Admitting: Cardiovascular Disease

## 2011-03-05 ENCOUNTER — Telehealth: Payer: Self-pay | Admitting: Emergency Medicine

## 2011-03-05 VITALS — BP 162/90 | HR 59 | Ht 68.25 in | Wt 177.1 lb

## 2011-03-05 DIAGNOSIS — I10 Essential (primary) hypertension: Secondary | ICD-10-CM

## 2011-03-05 MED ORDER — TELMISARTAN 80 MG PO TABS: 80.0000 mg | ORAL_TABLET | Freq: Every day | ORAL | Status: DC

## 2011-03-05 NOTE — Telephone Encounter (Signed)
Patient is calling again stating that she doesn't want to start Micardis now she wants to take the other option Dr.Nahser gave her. She wants to use her medication that she already has at home. She is going to take Lisinopril 40mg  1 tablet daily and amlodipine 2.5mg  daily. Patient will follow up in 3 weeks with Gollan.

## 2011-03-05 NOTE — Progress Notes (Signed)
Toni Berger Date of Birth  [birth year] [facility] / [facility] [address]     [address] [place], [place]  [address] [phone]  Fax  [phone]  History of Present Illness:  Hx of HTN. Pt has been having difficulty in getting BP meds that suit her.   Tried Amlodipine but it caused leg swelling.  Allergic to Sulfa so HCTZ was not tried.   Has been on Lisinopril 40.  Metoprolol was recently added which resulted in a better BP but she was concerned about her HR being so slow.   She has gradually decreased her dose of Metoprolol to 1/4 tablet a day.  She is concerned because the Lisinopril is not controlling her BP adequately.  Current Outpatient Prescriptions on File Prior to Visit  Medication Sig Dispense Refill  . aspirin 81 MG tablet Take 81 mg by mouth daily.        . clonazePAM (KLONOPIN) 1 MG tablet Take 1-3 tablets by mouth at bedtime as needed.  90 tablet  1  . lisinopril (PRINIVIL,ZESTRIL) 40 MG tablet Take 1 tablet (40 mg total) by mouth daily.  30 tablet  11  . pravastatin (PRAVACHOL) 40 MG tablet Take 20 mg by mouth daily.       Marland Kitchen DISCONTD: metoprolol (TOPROL-XL) 50 MG 24 hr tablet Take 1 tablet (50 mg total) by mouth daily.  30 tablet  11    Allergies  Allergen Reactions  . Carbidopa-Levodopa     REACTION: headache, stomach pain, chest pain and seeing purple colors  . Ciprofloxacin   . Povidone-Iodine   . Propoxyphene Hcl   . Sulfonamide Derivatives     "stop breathing, eyes roll in back of head and fingers web"  . Tetracycline     Past Medical History  Diagnosis Date  . Allergy   . Anxiety   . Hyperlipidemia   . Fibrocystic breast   . Restless leg syndrome   . Arthritis   . Migraines   . Lipoma     5/01 Abdominal wall lipoma    Past Surgical History  Procedure Date  . Tonsillectomy and adenoidectomy   . Abdominal hysterectomy   . Cholecystectomy   . Dobutamine stress echo 06/96    negative    History  Smoking  status  . Never Smoker   Smokeless tobacco  . Never Used    History  Alcohol Use No    Family History  Problem Relation Age of Onset  . Heart disease Mother   . Diabetes Mother   . Cancer Neg Hx     Reviw of Systems:  Reviewed in the HPI.  All other systems are negative.  Physical Exam: BP 162/90  Pulse 59  Ht 5' 8.25" (1.734 m)  Wt 177 lb 1.9 oz (80.341 kg)  BMI 26.73 kg/m2 The patient is alert and oriented x 3.  The mood and affect are normal.   Skin: warm and dry.  Color is normal.    HEENT:   the sclera are nonicteric.  The mucous membranes are moist.  The carotids are 2+ without bruits.  There is no thyromegaly.  There is no JVD.    Lungs: clear.  The chest wall is non tender.    Heart: regular rate with a normal S1 and S2.  There are no murmurs, gallops, or rubs. The PMI is not displaced.     Abdomin: good bowel sounds.  There is no guarding or rebound.  There is no hepatosplenomegaly or tenderness.  There are no masses.   Extremities:  no clubbing, cyanosis, or edema.  The legs are without rashes.  The distal pulses are intact.   Neuro:  Cranial nerves II - XII are intact.  Motor and sensory functions are intact.    The gait is normal.  ECG: Sinus brady at 59 .  NS ST /T wave abn.  Assessment / Plan:

## 2011-03-05 NOTE — Assessment & Plan Note (Addendum)
Toni Berger presents with multiple issues with multiple BP  Meds.  It was a bit of a challenge but I think I finally got through to her that she would most likely have to take MULTIPLE meds to control her BP.  She seemed unhappy with all of my suggestions.  She doesn't want to take any beta blockers.  I finally gave her 4 options made her pick one.  1. Substitute Micardis ( or other ARB) for Lisinopril - re-check in 2-3 weeks 2. Continue Lisinopril and restart amlodipine at a lower dose of 2.5 mg to hopefully avoid leg edema - re-check 2-3 wks 3. Continue Lisinopril - add Bystolic instead of Metoprolol - which should control BP better without as much bradycardia- re-check 2-3 wks. 4. Continue Lisinopril and add Lasix  And KCl.  ( which she refused immediately)  She chose to start Micardis- I've e-prescribed it to her pharmacy - Micardis 80 QD.  She will be rechecked in 2-3 weeks.  I expect that she will need to add further meds including low dose amlodipine and possible a B-blocker.  Perhaps she will not have as muck leg edema once she's on an ARB or ACE-I.  We will add BMP at that next visit.

## 2011-03-05 NOTE — Patient Instructions (Signed)
Your physician has recommended you make the following change in your medication: Start Micardis 80mg  1 tablet once daily. Your physician recommends that you schedule a follow-up appointment in: 3 weeks with Dr.Gollan

## 2011-03-06 ENCOUNTER — Telehealth: Payer: Self-pay | Admitting: *Deleted

## 2011-03-06 ENCOUNTER — Telehealth: Payer: Self-pay | Admitting: Physician Assistant

## 2011-03-06 NOTE — Telephone Encounter (Signed)
Patient calling with a blood pressure of 174/103 with heart rate 74 at 3:30 with numbness and tingling sensation. She took a Lisinopril 40 mg at 9:45 am with amlodipine 2.5 mg at 9:45.  The patient states doing very well today not been anxious but is concerned since husband had a stroke.  Please call with what to do.   [phone].  Notified patient to go ahead and take another amlodipine 2.5 mg now; told to monitor blood pressure and heart rate.  She will check blood pressure in the am and if blood pressure okay she will just take the lisinopril 40 mg & amlodipine 2.5 mg @ 9:45 she is to take her blood pressure about one hour after taking the lisinopril and amlodipine to see if blood pressure okay, if not she will take another 2.5 mg amlodipine at that time.  She was instructed to call the office over the weekend if she is not doing well or report to the ER.

## 2011-03-06 NOTE — Telephone Encounter (Signed)
Pt very concerned because her BP was very high. She called the ofc and took 1/4 tab amlodipine as directed, then did that again about 4 hr ago when BP in the 170s/110s. Pt took her BP now and was approx 180/120. She feels bad, tingling in places and generally feeling bad. She went to the ER previously for these Sx and was told it was from high BP. She says she is not anxious about this, does not drink caffeine and does not smoke. Long discussion w/ pt about options. Her BP has been hard to control and she has been intolerant of some meds, Ex. Had HR 4Os w/ metoprolol 50mg  - 1/2 tab and felt bad then. Amlodipine she tolerated OK for 3 months but then developed LE edema (but not bad) and is willing to try again. Advised her ER was an option but if she did not want to come in, she should take 1/2 tab amlodipine (5mg ) and wait 3 hrs, come in if Sx worsen or BP does not come down. Pt and her husband aware and agree.

## 2011-03-06 NOTE — Telephone Encounter (Signed)
Pt called this AM concerned with BP reading this AM. She states she did take Lisinopril 40mg  and Amlodipine 2.5mg  last night around 10pm and her BP this AM was 160/106. I advised pt to now try to take Lisinopril and Amlodipine in the AM. Dr. Elease Hashimoto had given pt 4 options to try, and I have advised that pt try this one throughout the weekend, told pt to take BP meds in the am, do not take BP until late afternoon once, and write that number down and do not worry with what the result is, only rely on how she feels. I fell pt is very anxious and is adding to her HTN. I have asked pt what her daily routine is, hoping to find any stressors that are contributing to her elev BP. Pt's husband has had a stroke and she is retired and stays home with him. She does states she works out regularly, she denies that she has anxiety. I have advised pt to also try and go for a walk daily throughout weekend. Pt does take clonazepam, she states it is for restless leg at night. I have advised she take this regularly at bedtime as well. Pt will call me back Monday with her BP results taken, once a day. Pt ok with this plan.

## 2011-03-09 NOTE — Telephone Encounter (Signed)
She was recently seen by Dr. Melburn Popper How was the BP over the weekend? She may need regular visits until we get this better controlled.

## 2011-03-12 NOTE — Telephone Encounter (Signed)
Pt has f/u 03/27/11 with TG.

## 2011-03-13 ENCOUNTER — Ambulatory Visit: Payer: Medicare Other | Admitting: Cardiovascular Disease

## 2011-03-13 ENCOUNTER — Ambulatory Visit: Payer: PRIVATE HEALTH INSURANCE | Admitting: Internal Medicine

## 2011-03-24 ENCOUNTER — Encounter: Payer: Self-pay | Admitting: Cardiovascular Disease

## 2011-03-27 ENCOUNTER — Ambulatory Visit (INDEPENDENT_AMBULATORY_CARE_PROVIDER_SITE_OTHER): Payer: Medicare Other | Admitting: Cardiovascular Disease

## 2011-03-27 ENCOUNTER — Encounter: Payer: Self-pay | Admitting: Cardiovascular Disease

## 2011-03-27 DIAGNOSIS — E785 Hyperlipidemia, unspecified: Secondary | ICD-10-CM

## 2011-03-27 DIAGNOSIS — F411 Generalized anxiety disorder: Secondary | ICD-10-CM

## 2011-03-27 DIAGNOSIS — I10 Essential (primary) hypertension: Secondary | ICD-10-CM

## 2011-03-27 LAB — BASIC METABOLIC PANEL
Calcium: 9.6 mg/dL (ref 8.4–10.5)
Creat: 0.84 mg/dL (ref 0.50–1.10)
Sodium: 142 mEq/L (ref 135–145)

## 2011-03-27 NOTE — Assessment & Plan Note (Signed)
Blood pressure is well controlled on today's visit. No changes made to the medications. 

## 2011-03-27 NOTE — Patient Instructions (Signed)
You are doing well. No medication changes were made. Please call us if you have new issues that need to be addressed before your next appt.  We will call you for a follow up Appt. In 12 months  

## 2011-03-27 NOTE — Assessment & Plan Note (Signed)
Cholesterol is well controlled on her current medication regimen. No further changes made.

## 2011-03-27 NOTE — Progress Notes (Signed)
   Patient ID: Toni Berger, female    DOB: 1945-09-25, 65 y.o.   MRN: [id number]  HPI Comments: 65 year old woman with history of hypertension, seen last month in the clinic and started on lisinopril 40 mg and low-dose amlodipine. She returns for routine followup.  In the past, she had tried amlodipine though had leg swelling. She reports that she has been monitoring her blood pressure over the past several weeks and it has improved on lisinopril 40 mg daily with amlodipine 2.5 mg daily. In the past several days, she has not had amlodipine and her blood pressure has been well controlled. She has had no significant side effects from the medications. Systolic pressures run in the 130 to 1:30 range. She does have occasional 150 systolic but this is usually in the evening and is rare.   Outpatient Encounter Prescriptions as of 03/27/2011  Medication Sig Dispense Refill  . amLODipine (NORVASC) 10 MG tablet Take 2.5 mg by mouth daily as needed.       Marland Kitchen aspirin 81 MG tablet Take 81 mg by mouth daily.        . clonazePAM (KLONOPIN) 1 MG tablet Take 1-3 tablets by mouth at bedtime as needed.  90 tablet  1  . ibuprofen (ADVIL) 200 MG tablet Take 200 mg by mouth every 6 (six) hours as needed.        Marland Kitchen lisinopril (PRINIVIL,ZESTRIL) 40 MG tablet Take 40 mg by mouth daily.        . pravastatin (PRAVACHOL) 40 MG tablet Take 20 mg by mouth daily.          Review of Systems  Constitutional: Negative.   HENT: Negative.   Eyes: Negative.   Respiratory: Negative.   Cardiovascular: Negative.   Gastrointestinal: Negative.   Musculoskeletal: Negative.   Skin: Negative.   Neurological: Negative.   Hematological: Negative.   Psychiatric/Behavioral: Negative.   All other systems reviewed and are negative.    BP 136/78  Pulse 62  Ht 5' 8.5" (1.74 m)  Wt 175 lb (79.379 kg)  BMI 26.22 kg/m2  Physical Exam  Nursing note and vitals reviewed. Constitutional: She is oriented to person, place, and time.  She appears well-developed and well-nourished.  HENT:  Head: Normocephalic.  Nose: Nose normal.  Mouth/Throat: Oropharynx is clear and moist.  Eyes: Conjunctivae are normal. Pupils are equal, round, and reactive to light.  Neck: Normal range of motion. Neck supple. No JVD present.  Cardiovascular: Normal rate, regular rhythm, S1 normal, S2 normal, normal heart sounds and intact distal pulses.  Exam reveals no gallop and no friction rub.   No murmur heard. Pulmonary/Chest: Effort normal and breath sounds normal. No respiratory distress. She has no wheezes. She has no rales. She exhibits no tenderness.  Abdominal: Soft. Bowel sounds are normal. She exhibits no distension. There is no tenderness.  Musculoskeletal: Normal range of motion. She exhibits no edema and no tenderness.  Lymphadenopathy:    She has no cervical adenopathy.  Neurological: She is alert and oriented to person, place, and time. Coordination normal.  Skin: Skin is warm and dry. No rash noted. No erythema.  Psychiatric: She has a normal mood and affect. Her behavior is normal. Judgment and thought content normal.         Assessment and Plan

## 2011-04-01 ENCOUNTER — Ambulatory Visit: Payer: Medicare Other | Admitting: Internal Medicine

## 2011-04-02 ENCOUNTER — Telehealth: Payer: Self-pay | Admitting: *Deleted

## 2011-04-02 DIAGNOSIS — E875 Hyperkalemia: Secondary | ICD-10-CM

## 2011-04-02 NOTE — Telephone Encounter (Signed)
Spoke to pt, notified of below. She has been eating a lot of dried peaches and potatoes, and I notified pt maybe cutting back on those and she will come back in 1 month for BMP.

## 2011-04-02 NOTE — Telephone Encounter (Signed)
Message copied by Annia Belt on Thu Apr 02, 2011  5:26 PM ------      Message from: Phoebe Sharps      Created: Wed Apr 01, 2011  9:23 PM       BMP shows potassium is a little high.      Try to reduce intake of high potassium foods.      Check again in one month

## 2011-05-05 ENCOUNTER — Other Ambulatory Visit (INDEPENDENT_AMBULATORY_CARE_PROVIDER_SITE_OTHER): Payer: Medicare Other | Admitting: *Deleted

## 2011-05-05 DIAGNOSIS — E875 Hyperkalemia: Secondary | ICD-10-CM

## 2011-05-05 LAB — BASIC METABOLIC PANEL
Chloride: 107 mEq/L (ref 96–112)
Potassium: 4.9 mEq/L (ref 3.5–5.3)
Sodium: 140 mEq/L (ref 135–145)

## 2011-05-11 ENCOUNTER — Telehealth: Payer: Self-pay

## 2011-05-11 NOTE — Telephone Encounter (Signed)
Has redness under both eyes near the cheek bone.  She would like to know if one of her medications may be causing this.

## 2011-05-13 NOTE — Telephone Encounter (Signed)
Notified patient most the medications she is taking does cause a rash or even flushing but does not mention anything about redness on the face.

## 2011-05-27 ENCOUNTER — Other Ambulatory Visit: Payer: Self-pay | Admitting: *Deleted

## 2011-05-27 MED ORDER — CLONAZEPAM 0.5 MG PO TABS: 0.5000 mg | ORAL_TABLET | Freq: Every evening | ORAL | Status: DC | PRN

## 2011-05-27 NOTE — Telephone Encounter (Signed)
Okay #90 x 1 

## 2011-05-27 NOTE — Telephone Encounter (Signed)
Form on your desk  

## 2011-05-27 NOTE — Telephone Encounter (Signed)
rx faxed to pharmacy manually  

## 2011-07-28 ENCOUNTER — Telehealth: Payer: Self-pay

## 2011-07-28 MED ORDER — AMLODIPINE BESYLATE 10 MG PO TABS: 10.0000 mg | ORAL_TABLET | Freq: Every day | ORAL | Status: DC

## 2011-07-28 MED ORDER — PRAVASTATIN SODIUM 40 MG PO TABS: 40.0000 mg | ORAL_TABLET | Freq: Every day | ORAL | Status: DC

## 2011-07-28 MED ORDER — LISINOPRIL 40 MG PO TABS: 40.0000 mg | ORAL_TABLET | Freq: Every day | ORAL | Status: DC

## 2011-07-28 NOTE — Telephone Encounter (Signed)
Refill for pravastatin, amlodipine and lisinopril for 90 day supply to [facility].

## 2011-07-31 ENCOUNTER — Telehealth: Payer: Self-pay

## 2011-07-31 MED ORDER — LISINOPRIL 40 MG PO TABS: 40.0000 mg | ORAL_TABLET | Freq: Every day | ORAL | Status: DC

## 2011-07-31 MED ORDER — AMLODIPINE BESYLATE 10 MG PO TABS: 10.0000 mg | ORAL_TABLET | Freq: Every day | ORAL | Status: DC

## 2011-07-31 MED ORDER — PRAVASTATIN SODIUM 40 MG PO TABS: 40.0000 mg | ORAL_TABLET | Freq: Every day | ORAL | Status: DC

## 2011-07-31 NOTE — Telephone Encounter (Signed)
Refill sent to [facility] mail order for lisinopril, pravastatin and amlodipine for 90 day supply.

## 2011-07-31 NOTE — Telephone Encounter (Signed)
Refill was cancelled for the 90 day supply that was sent to [facility] on the following medications lisinopril, pravastatin and amlodipine. Notified patient Rx now sent to [facility] mail order.

## 2011-08-04 ENCOUNTER — Other Ambulatory Visit: Payer: Self-pay | Admitting: *Deleted

## 2011-08-04 ENCOUNTER — Telehealth: Payer: Self-pay

## 2011-08-04 MED ORDER — CLONAZEPAM 0.5 MG PO TABS: 0.5000 mg | ORAL_TABLET | Freq: Every evening | ORAL | Status: DC | PRN

## 2011-08-04 NOTE — Telephone Encounter (Signed)
Okay #90 x 0 [facility]

## 2011-08-04 NOTE — Telephone Encounter (Signed)
rx called into pharmacy, [facility]

## 2011-08-04 NOTE — Telephone Encounter (Signed)
Spoke with Tan @ [facility] gave a verbal for 90 day supply for pravastatin, lisinopril and amlodipine.

## 2011-08-12 ENCOUNTER — Encounter: Payer: Self-pay | Admitting: Internal Medicine

## 2011-08-12 ENCOUNTER — Encounter: Payer: Medicare Other | Admitting: Internal Medicine

## 2011-08-12 ENCOUNTER — Ambulatory Visit (INDEPENDENT_AMBULATORY_CARE_PROVIDER_SITE_OTHER): Payer: Medicare Other | Admitting: Internal Medicine

## 2011-08-12 VITALS — BP 136/71 | HR 48 | Temp 97.1°F | Ht 68.0 in | Wt 180.0 lb

## 2011-08-12 DIAGNOSIS — Z0000 Encounter for general adult medical examination without abnormal findings: Secondary | ICD-10-CM

## 2011-08-12 DIAGNOSIS — F411 Generalized anxiety disorder: Secondary | ICD-10-CM

## 2011-08-12 DIAGNOSIS — G2581 Restless legs syndrome: Secondary | ICD-10-CM

## 2011-08-12 DIAGNOSIS — E785 Hyperlipidemia, unspecified: Secondary | ICD-10-CM

## 2011-08-12 DIAGNOSIS — Z1231 Encounter for screening mammogram for malignant neoplasm of breast: Secondary | ICD-10-CM

## 2011-08-12 DIAGNOSIS — I10 Essential (primary) hypertension: Secondary | ICD-10-CM

## 2011-08-12 NOTE — Assessment & Plan Note (Signed)
episodic Really worries about her husband No ongoing Rx needed

## 2011-08-12 NOTE — Progress Notes (Signed)
Subjective:    Patient ID: Toni Berger, female    DOB: Feb 10, 1946, 65 y.o.   MRN: [id number]  HPI Here for Wellness visit  Advanced directives in chart Has had tetanus shot Doesn't want flu, pneumovax or shingles vaccines Due for mammogram this year--[facility] Had stool immunoassay less than a year ago  No falls and no risk factors No vision or hearing problems  Not depressed Anxiety is controlled--uses clonazepam at bedtime. Feels the lower dose is working fine  Now seeing Dr Mariah Milling BP has been great lately No chest pain No SOB No sig edema Goes to gym with personal trainer regularly  No sig arthritis problems occ hip or hand pain occ uses advil  Current Outpatient Prescriptions on File Prior to Visit  Medication Sig Dispense Refill  . amLODipine (NORVASC) 10 MG tablet Take 1 tablet (10 mg total) by mouth daily.  90 tablet  3  . aspirin 81 MG tablet Take 81 mg by mouth daily.        . clonazePAM (KLONOPIN) 0.5 MG tablet Take 1-3 tablets (0.5-1.5 mg total) by mouth at bedtime as needed.  90 tablet  0  . ibuprofen (ADVIL) 200 MG tablet Take 200 mg by mouth every 6 (six) hours as needed.        Marland Kitchen lisinopril (PRINIVIL,ZESTRIL) 40 MG tablet Take 1 tablet (40 mg total) by mouth daily.  90 tablet  3  . pravastatin (PRAVACHOL) 40 MG tablet Take 1 tablet (40 mg total) by mouth daily.  90 tablet  3    Allergies  Allergen Reactions  . Carbidopa-Levodopa     REACTION: headache, stomach pain, chest pain and seeing purple colors  . Ciprofloxacin   . Metoprolol     To low heart rate/ BP  . Povidone-Iodine   . Propoxyphene Hcl   . Sulfonamide Derivatives     "stop breathing, eyes roll in back of head and fingers web"  . Tetracycline     Past Medical History  Diagnosis Date  . Allergy   . Anxiety   . Hyperlipidemia   . Fibrocystic breast   . Restless leg syndrome   . Arthritis   . Migraines   . Lipoma     5/01 Abdominal wall lipoma    Past Surgical  History  Procedure Date  . Tonsillectomy and adenoidectomy 1974  . Abdominal hysterectomy   . Cholecystectomy 1999    appendix  . Dobutamine stress echo 06/96    negative  . Total abdominal hysterectomy w/ bilateral salpingoophorectomy 1981    Family History  Problem Relation Age of Onset  . Heart disease Mother   . Diabetes Mother   . Aneurysm Mother     AAA  . Heart failure Mother   . Colon cancer Neg Hx   . Breast cancer Neg Hx   . Heart failure Father   . Coronary artery disease Father     History   Social History  . Marital Status: Married    Spouse Name: N/A    Number of Children: 1  . Years of Education: N/A   Occupational History  . retired Airline pilot for town of [employer]    Social History Main Topics  . Smoking status: Never Smoker   . Smokeless tobacco: Never Used  . Alcohol Use: No  . Drug Use: No  . Sexually Active: Not on file   Other Topics Concern  . Not on file   Social History Narrative   Has  Health Care POA-- husband and then stepson.would accept resuscitationWouldn't want nutritional support for vegetative state   Review of Systems Appetite is good Weight is stable Bowels are fine     Objective:   Physical Exam  Constitutional: She is oriented to person, place, and time. She appears well-developed and well-nourished. No distress.  HENT:  Mouth/Throat: Oropharynx is clear and moist. No oropharyngeal exudate.  Neck: Normal range of motion. Neck supple. No thyromegaly present.  Cardiovascular: Normal rate, regular rhythm, normal heart sounds and intact distal pulses.  Exam reveals no gallop.   No murmur heard. Pulmonary/Chest: Effort normal and breath sounds normal. No respiratory distress. She has no wheezes. She has no rales.  Abdominal: Soft. There is no tenderness.  Genitourinary:       Mild cystic changes in breasts No abnormal lesions  Musculoskeletal: Normal range of motion. She exhibits no edema and no tenderness.       Very  early bunions both feet  Lymphadenopathy:    She has no cervical adenopathy.    She has no axillary adenopathy.  Neurological: She is alert and oriented to person, place, and time.       President-- "Obama, Milas Kocher, Bush" [phone] [id number]   (needed 2 tries--got R and O mixed up but realized it) Recall 2/3  Skin: Skin is warm. No rash noted.  Psychiatric: She has a normal mood and affect. Her behavior is normal. Judgment and thought content normal.          Assessment & Plan:

## 2011-08-12 NOTE — Patient Instructions (Signed)
Please set up bone density test and mammogram

## 2011-08-12 NOTE — Assessment & Plan Note (Signed)
I have personally reviewed the Medicare Annual Wellness questionnaire and have noted 1. The patient's medical and social history 2. Their use of alcohol, tobacco or illicit drugs 3. Their current medications and supplements 4. The patient's functional ability including ADL's, fall risks, home safety risks and hearing or visual             impairment. 5. Diet and physical activities 6. Evidence for depression or mood disorders  The patients weight, height, BMI and visual acuity have been recorded in the chart I have made referrals, counseling and provided education to the patient based review of the above and I have provided the pt with a written personalized care plan for preventive services.  I have provided you with a copy of your personalized plan for preventive services. Please take the time to review along with your updated medication list.  Doesn't want imms Not due yet stool testing Will schedule mammo and bone density test Mild cognitive issues---did recall again later and remembered then. Will just observe  Discussed multivitamin and vitamin D---she will restart one a day

## 2011-08-12 NOTE — Assessment & Plan Note (Signed)
does well with nighttime clonazepam

## 2011-08-12 NOTE — Assessment & Plan Note (Signed)
Lab Results  Component Value Date   LDLCALC 75 01/28/2011   At goal No problems with statin

## 2011-08-12 NOTE — Assessment & Plan Note (Signed)
BP Readings from Last 3 Encounters:  08/12/11 136/71  03/27/11 136/78  03/05/11 162/90   Good control now No changes needed

## 2011-08-14 ENCOUNTER — Encounter: Payer: Self-pay | Admitting: Internal Medicine

## 2011-08-15 ENCOUNTER — Encounter: Payer: Self-pay | Admitting: Internal Medicine

## 2011-08-18 ENCOUNTER — Encounter: Payer: Self-pay | Admitting: *Deleted

## 2011-08-21 ENCOUNTER — Encounter: Payer: Self-pay | Admitting: *Deleted

## 2011-08-24 ENCOUNTER — Telehealth: Payer: Self-pay

## 2011-08-24 NOTE — Telephone Encounter (Signed)
Would ask Letvak about Vit D. Is safe for her to take. Would recommend it for heart health

## 2011-08-24 NOTE — Telephone Encounter (Signed)
She had a bone density test with Dr. Alphonsus Sias and was told slight bone loss. Was told to take Vitamin D 1000  mg one tablet daily and calcium 1000 mg one daily. She wants Dr. Mariah Milling to review her medications with her history because she knowes that vitamin d can be dangerous to take with other medications. Which vitamin D does she need to buy; she is aware there is so many different vitamin d's to take.  Please advise which Vitamin D she should take? Please call back.

## 2011-08-24 NOTE — Telephone Encounter (Signed)
Notified patient per Dr. Mariah Milling okay to take the Vitamin D that Dr. Alphonsus Sias recommended. Dr. Mariah Milling suggest she call Dr. Alphonsus Sias back is she is unsure of what dose he told her to take.

## 2011-08-26 ENCOUNTER — Telehealth: Payer: Self-pay | Admitting: Internal Medicine

## 2011-08-26 MED ORDER — CLONAZEPAM 1 MG PO TABS: 1.0000 mg | ORAL_TABLET | Freq: Every day | ORAL | Status: DC

## 2011-08-26 NOTE — Telephone Encounter (Signed)
We had decreased the dose and at her last check up she said that dose was working fine Was that not correct??

## 2011-08-26 NOTE — Telephone Encounter (Signed)
Patient called and stated that there was a misunderstanding on her klonopin she was on 1mg  taking  0.5 to 3mg  at night.  She would like a Rx wrote for 1mg  tablets 1-3mg  qhs.  Please advise.

## 2011-08-26 NOTE — Telephone Encounter (Signed)
Okay to change back to clonazepam 1mg  but limit to up to 2 per night Can send Rx #180 x 0 for 90 day supply

## 2011-08-26 NOTE — Telephone Encounter (Signed)
rx called into pharmacy Spoke with patient and advised results, she stated she will try her best to only take 2 tablets.

## 2011-08-26 NOTE — Telephone Encounter (Signed)
Spoke with patient and she states that the 0.5mg  taking 1-3 at bedtime is not working, she said it should be 1 mg taking 1-3 mg at bedtime, per pt she will run out before 09/03/2011. Pt states she's not sure where the misunderstanding happened, I adivsed she told Dr.Letvak that she cuts them in half, per pt she have cut them in half before and then she took 4? Please advise.

## 2011-11-06 ENCOUNTER — Other Ambulatory Visit: Payer: Self-pay

## 2011-11-06 MED ORDER — CLONAZEPAM 1 MG PO TABS: 1.0000 mg | ORAL_TABLET | Freq: Every day | ORAL | Status: DC

## 2011-11-06 NOTE — Telephone Encounter (Signed)
Received fax refill request from patients pharmacy.  Okay to refill? 

## 2011-11-06 NOTE — Telephone Encounter (Signed)
rx called into pharmacy

## 2011-11-06 NOTE — Telephone Encounter (Signed)
Okay #180 x 0 

## 2011-11-11 ENCOUNTER — Telehealth: Payer: Self-pay | Admitting: Internal Medicine

## 2011-11-11 NOTE — Telephone Encounter (Signed)
That really is too high a dose---2mg  is the maximum at one time We need to discuss alternative meds for restless legs then

## 2011-11-11 NOTE — Telephone Encounter (Signed)
Spoke with patient and she would like to know what your alternative choices would be? Pt has appt scheduled for Friday for ankle pain and I advised she could talk with you about medications then. Per pt she doesn't want to discuss it then, pt is very upset and doesn't understand why you can't increase her dose. Please advise

## 2011-11-11 NOTE — Telephone Encounter (Signed)
Triage Record Num: [id number] Operator: Di Kindle Patient Name: Toni Berger Call Date & Time: 11/11/2011 10:50:36AM Patient Phone: [phone] PCP: Tillman Abide Patient Gender: Female PCP Fax : [phone] Patient DOB: [birth year] Practice Name: [facility] Reason for Call: Caller: [name]/Patient is calling with a question about Clorazepam.The medication was written by Karie Schwalbe States 1 mg ordered 1-2 tabs PRN HS, for [facility] at [facility] , [facility] told unable to refill till 11/24/11, last OV 11/12 pt states is taking less that ordered often, if it takes more than 2 at night for restless legs, she only has 4 pills left. Can that RX be for 1 mg for 1-3 as needed? (message left on refill line also) Please call on cell [phone]. OFFICE Please. Protocol(s) Used: Medication Questions - Adult Recommended Outcome per Protocol: Call Provider within 4 Hours Reason for Outcome: Recurrence of a symptom(s) or illness post prescribed medication treatment AND provider instructed patient to call if symptom(s) returned. Care Advice: ~ Have pharmacy phone number and prescription information available when you speak with provider. 11/11/2011 10:59:25AM Page 1 of 1 [email or web address]

## 2011-11-12 NOTE — Telephone Encounter (Signed)
Because it is very dependence provoking and can be harmful I understand she wants relief but I will not prescribe medications or doses I think are a bad idea

## 2011-11-12 NOTE — Telephone Encounter (Signed)
This needs to be discussed in person in the office

## 2011-11-12 NOTE — Telephone Encounter (Signed)
Spoke with patient and advised results, spoke with patient over , pt still doesn't understand why she can't have the medication as she want? Pt asked if Dr.Letvak still wanted to be her physician?and maybe she needs to switch to another [place] dr. Pt states she's not "hooked" on this medication and doesn't have any side effects. I tried explaining the dosage to pt but she wouldn't listen, she's trying to explain that she cuts the tablets in half and if she does that then she'll run Dawn. Please help.

## 2011-11-13 ENCOUNTER — Ambulatory Visit: Payer: Medicare Other | Admitting: Internal Medicine

## 2011-11-16 ENCOUNTER — Telehealth: Payer: Self-pay | Admitting: *Deleted

## 2011-11-16 ENCOUNTER — Ambulatory Visit (INDEPENDENT_AMBULATORY_CARE_PROVIDER_SITE_OTHER): Payer: Medicare Other | Admitting: Internal Medicine

## 2011-11-16 ENCOUNTER — Ambulatory Visit: Payer: Medicare Other | Admitting: Internal Medicine

## 2011-11-16 ENCOUNTER — Encounter: Payer: Self-pay | Admitting: Internal Medicine

## 2011-11-16 VITALS — BP 128/70 | HR 55 | Temp 98.5°F | Ht 68.0 in | Wt 180.0 lb

## 2011-11-16 DIAGNOSIS — G2581 Restless legs syndrome: Secondary | ICD-10-CM

## 2011-11-16 MED ORDER — CLONAZEPAM 1 MG PO TABS: 1.0000 mg | ORAL_TABLET | Freq: Every day | ORAL | Status: DC

## 2011-11-16 NOTE — Telephone Encounter (Signed)
Patient calling back after OV today, upset and asking how is she suppose to make 7 tabs last until 11/24/11, per pt the intructions are 1-2 tabs and Dr. Alphonsus Sias only gave her 7 tabs. Pt also is upset because " Dr. Alphonsus Sias questioned me if I was selling my pills" per pt she is thinking about switching to Dr. Ermalene Searing because of that statement. Please advise.

## 2011-11-16 NOTE — Telephone Encounter (Signed)
I told her I didn't think she was selling pills She has had to cut down and not use as many over the past few days and so that is why I only gave her #7 (she hasn't had enough to take 2 a day for a while)  It is her decision whether to switch but we all are trying to be very careful with these meds Can call her pharmacy and change the emergency supply to #14 (she is due to refill in 7 days)

## 2011-11-16 NOTE — Assessment & Plan Note (Signed)
Has failed other meds--Parkinson's meds Discussed that she needs to limit to no more than 2mg  daily Offered appt with sleep specialist to consider other options--she wants to hold off Will give #7 emergency supply and then she will need to monitor better  Counseled most of 15 minute visit

## 2011-11-16 NOTE — Telephone Encounter (Signed)
Spoke with patient and advised that rx was phoned in for additional 7 tablets, [facility] 1st 7 tabs and can't pick up the 2nd 7 tabs until Thursday. Spoke with patient again and advised what [facility] said. rx called into pharmacy

## 2011-11-16 NOTE — Telephone Encounter (Signed)
appointment scheduled

## 2011-11-16 NOTE — Progress Notes (Signed)
Subjective:    Patient ID: Toni Berger, female    DOB: 12-04-1945, 66 y.o.   MRN: [id number]  HPI Here to review clonazepam use She got an Rx for #90 of the 1mg  tabs at first I explained that this was probably an error from the 0.5mg  size This was only done once  She was given 3 month supply 12/12 Then requested more on 2/22 and pharmacy (properly) didn't fill  Uses for the restless legs Has failed sinemet and requip due to side effects Has tried some advil at times  Feels she has good intake of iron in diet  Current Outpatient Prescriptions on File Prior to Visit  Medication Sig Dispense Refill  . amLODipine (NORVASC) 10 MG tablet Take 1 tablet (10 mg total) by mouth daily.  90 tablet  3  . aspirin 81 MG tablet Take 81 mg by mouth daily.        . clonazePAM (KLONOPIN) 1 MG tablet Take 1-2 tablets (1-2 mg total) by mouth at bedtime.  180 tablet  0  . ibuprofen (ADVIL) 200 MG tablet Take 200 mg by mouth every 6 (six) hours as needed.        Marland Kitchen lisinopril (PRINIVIL,ZESTRIL) 40 MG tablet Take 1 tablet (40 mg total) by mouth daily.  90 tablet  3  . pravastatin (PRAVACHOL) 40 MG tablet Take 1 tablet (40 mg total) by mouth daily.  90 tablet  3    Allergies  Allergen Reactions  . Carbidopa-Levodopa     REACTION: headache, stomach pain, chest pain and seeing purple colors  . Ciprofloxacin   . Metoprolol     To low heart rate/ BP  . Povidone-Iodine   . Propoxyphene Hcl   . Sulfonamide Derivatives     "stop breathing, eyes roll in back of head and fingers web"  . Tetracycline     Past Medical History  Diagnosis Date  . Allergy   . Anxiety   . Hyperlipidemia   . Fibrocystic breast   . Restless leg syndrome   . Arthritis   . Migraines   . Lipoma     5/01 Abdominal wall lipoma  . Osteoporosis, unspecified     Past Surgical History  Procedure Date  . Tonsillectomy and adenoidectomy 1974  . Abdominal hysterectomy   . Cholecystectomy 1999    appendix  . Dobutamine  stress echo 06/96    negative  . Total abdominal hysterectomy w/ bilateral salpingoophorectomy 1981    Family History  Problem Relation Age of Onset  . Heart disease Mother   . Diabetes Mother   . Aneurysm Mother     AAA  . Heart failure Mother   . Colon cancer Neg Hx   . Breast cancer Neg Hx   . Heart failure Father   . Coronary artery disease Father     History   Social History  . Marital Status: Married    Spouse Name: N/A    Number of Children: 1  . Years of Education: N/A   Occupational History  . retired Airline pilot for town of [employer]    Social History Main Topics  . Smoking status: Never Smoker   . Smokeless tobacco: Never Used  . Alcohol Use: No  . Drug Use: No  . Sexually Active: Not on file   Other Topics Concern  . Not on file   Social History Narrative   Has Health Care POA-- husband and then stepson.would accept resuscitationWouldn't want nutritional support for vegetative  state   Review of Systems     Objective:   Physical Exam        Assessment & Plan:

## 2011-11-27 ENCOUNTER — Telehealth: Payer: Self-pay | Admitting: Internal Medicine

## 2011-11-27 NOTE — Telephone Encounter (Signed)
This is fine with me I don't think she was happy with some of our last interactions about her meds

## 2011-11-27 NOTE — Telephone Encounter (Signed)
Patient would like to switch PCP from Dr. Alphonsus Sias to Dr. Dayton Martes.  She prefers a female doctor.  Please advise as to your wishes on this.

## 2011-11-30 NOTE — Telephone Encounter (Signed)
I have reviewed the notes and I question if it is appropriate to switch physicians given the last several interaction, i.e- is there drug seeking behavior? Will forward to [place].

## 2011-12-03 ENCOUNTER — Telehealth: Payer: Self-pay | Admitting: Internal Medicine

## 2011-12-03 NOTE — Telephone Encounter (Signed)
Message sent to [facility] to advise given questions/concerns.  Thanks

## 2011-12-03 NOTE — Telephone Encounter (Signed)
Pt called, wanting to know if physician had decided if they would take her on as a patient. Told pt manager would give her a call back. Told her one of the physicians are undecided.Marland Kitchen

## 2011-12-03 NOTE — Telephone Encounter (Signed)
Questioned about if appropriate to transfer care with this information.  Please advise.  Thanks

## 2011-12-03 NOTE — Telephone Encounter (Signed)
Not really "drug seeking behavior" but she has ideas about what she wants that I didn't really agree with. She may do better with a different MD

## 2011-12-08 NOTE — Telephone Encounter (Signed)
FYI to [employer] for follow up. Pt would like to be notified if she can schedule w/ another physician.Toni Berger

## 2011-12-10 NOTE — Telephone Encounter (Signed)
Please call patient back on Monday.  She may want to consider another doctor altogether based on provider's feedback.  I tried to call her and left voice mails and did not receive a call back.

## 2011-12-10 NOTE — Telephone Encounter (Signed)
Called patient and left a voice mail for her to call me back to discuss.

## 2011-12-18 NOTE — Telephone Encounter (Signed)
Disregard this message 

## 2012-02-12 ENCOUNTER — Ambulatory Visit: Payer: Medicare Other | Admitting: Internal Medicine

## 2012-08-12 ENCOUNTER — Encounter: Payer: Medicare Other | Admitting: Internal Medicine

## 2012-09-01 ENCOUNTER — Telehealth: Payer: Self-pay | Admitting: Cardiovascular Disease

## 2012-09-01 NOTE — Telephone Encounter (Signed)
Called pt to remind her of missed recall appt to schedule.  Pt states she called 4 times to state she no longer wants to see any [place] physician.  She states she has a new cardiologist and to please not call her again.  Recall has been deleted.

## 2012-09-01 NOTE — Telephone Encounter (Signed)
fyi

## 2013-07-31 ENCOUNTER — Ambulatory Visit: Payer: Self-pay | Admitting: Podiatry

## 2013-08-09 ENCOUNTER — Ambulatory Visit: Payer: Self-pay | Admitting: Podiatry

## 2013-08-09 ENCOUNTER — Encounter: Payer: Self-pay | Admitting: *Deleted

## 2013-08-14 ENCOUNTER — Ambulatory Visit (INDEPENDENT_AMBULATORY_CARE_PROVIDER_SITE_OTHER): Payer: Medicare Other | Admitting: Podiatry

## 2013-08-14 ENCOUNTER — Encounter: Payer: Self-pay | Admitting: Podiatry

## 2013-08-14 VITALS — BP 118/71 | HR 59 | Resp 16 | Ht 68.0 in | Wt 175.0 lb

## 2013-08-14 DIAGNOSIS — B351 Tinea unguium: Secondary | ICD-10-CM

## 2013-08-14 DIAGNOSIS — M79609 Pain in unspecified limb: Secondary | ICD-10-CM

## 2013-08-14 NOTE — Progress Notes (Signed)
Tawan presents today with a chief complaint of painful toenails one through 5 bilateral. She's also complaining of a painful reactive hyperkeratotic lesion to the plantar aspect of the first second metatarsophalangeal joint of the left foot.  Objective: Vital signs are stable she is alert and oriented x3. Pulses remain palpable bilateral. Nails are thick yellow dystrophic clinically mycotic sharp incurvated nail margins are painful on palpation as well as debridement. She also has reactive hyperkeratosis sub-second metatarsal and third metatarsophalangeal joint bilateral.  Assessment: Pain in limb secondary to onychomycosis and painful reactive keratoses.  Plan: Debridement of all reactive hyperkeratosis and debridement of nails is a covered service 1 through 5 bilateral secondary to pain.

## 2013-10-23 ENCOUNTER — Ambulatory Visit: Payer: Medicare Other | Admitting: Podiatry

## 2013-11-01 ENCOUNTER — Ambulatory Visit: Payer: Medicare Other | Admitting: Podiatry

## 2013-12-06 ENCOUNTER — Ambulatory Visit (INDEPENDENT_AMBULATORY_CARE_PROVIDER_SITE_OTHER): Payer: Medicare Other | Admitting: Podiatry

## 2013-12-06 VITALS — BP 125/71 | HR 72 | Resp 16

## 2013-12-06 DIAGNOSIS — M79609 Pain in unspecified limb: Secondary | ICD-10-CM

## 2013-12-06 DIAGNOSIS — B351 Tinea unguium: Secondary | ICD-10-CM

## 2013-12-06 NOTE — Progress Notes (Signed)
She presents today chief complaint of painful E. elongated toenails one through 5 bilateral.  Objective: Pulses are palpable bilateral. Nails are thick yellow dystrophic onychomycotic and painful palpation.  Assessment: Pain in limb secondary to onychomycosis 1 through 5 bilateral.  Plan: Debridement of nails 1 through 5 bilateral is cover service secondary to pain.

## 2014-02-28 ENCOUNTER — Ambulatory Visit (INDEPENDENT_AMBULATORY_CARE_PROVIDER_SITE_OTHER): Payer: Medicare Other | Admitting: Podiatry

## 2014-02-28 VITALS — BP 137/75 | HR 56 | Resp 16

## 2014-02-28 DIAGNOSIS — M79609 Pain in unspecified limb: Secondary | ICD-10-CM

## 2014-02-28 DIAGNOSIS — B351 Tinea unguium: Secondary | ICD-10-CM

## 2014-02-28 NOTE — Progress Notes (Signed)
Presents today with a chief complaint of elongated toenails.  Objective: Pulses are palpable bilateral. Nails are thick yellow dystrophic with mycotic painful palpation there are brittle.  Assessment: Pain in limb secondary to onychomycosis and Alzheimer's disease.  Plan: Debridement of nails in thickness and length as cover service secondary to pain.

## 2014-05-09 ENCOUNTER — Ambulatory Visit (INDEPENDENT_AMBULATORY_CARE_PROVIDER_SITE_OTHER): Payer: Medicare Other | Admitting: Podiatry

## 2014-05-09 VITALS — BP 138/82 | HR 59 | Resp 16

## 2014-05-09 DIAGNOSIS — M79609 Pain in unspecified limb: Secondary | ICD-10-CM

## 2014-05-09 DIAGNOSIS — M79676 Pain in unspecified toe(s): Secondary | ICD-10-CM

## 2014-05-09 DIAGNOSIS — B351 Tinea unguium: Secondary | ICD-10-CM

## 2014-05-09 NOTE — Progress Notes (Signed)
She presents today with a chief complaint of painful elongated toenails and porokeratotic lesions.  Objective: Pulses are palpable bilateral. Nails are thick yellow dystrophic with mycotic and porokeratotic lesions are present and painful.  Assessment: Pain in limb secondary to onychomycosis and porokeratosis bilateral.  Plan: Debridement of nails and porokeratotic lesions bilateral. Followup with her in 2 months.

## 2014-07-09 ENCOUNTER — Ambulatory Visit (INDEPENDENT_AMBULATORY_CARE_PROVIDER_SITE_OTHER): Payer: Medicare Other | Admitting: Podiatry

## 2014-07-09 ENCOUNTER — Encounter: Payer: Self-pay | Admitting: Podiatry

## 2014-07-09 DIAGNOSIS — M79676 Pain in unspecified toe(s): Secondary | ICD-10-CM

## 2014-07-09 DIAGNOSIS — B351 Tinea unguium: Secondary | ICD-10-CM

## 2014-07-09 NOTE — Progress Notes (Signed)
Presents today chief complaint of painful elongated toenails.  Objective: Pulses are palpable bilateral nails are thick, yellow dystrophic onychomycosis and painful palpation.   Assessment: Onychomycosis with pain in limb.  Plan: Treatment of nails in thickness and length as covered service secondary to pain.  

## 2014-09-10 ENCOUNTER — Ambulatory Visit: Payer: PRIVATE HEALTH INSURANCE | Admitting: Podiatry

## 2014-09-10 ENCOUNTER — Ambulatory Visit (INDEPENDENT_AMBULATORY_CARE_PROVIDER_SITE_OTHER): Payer: Medicare Other | Admitting: Podiatry

## 2014-09-10 DIAGNOSIS — B351 Tinea unguium: Secondary | ICD-10-CM

## 2014-09-10 DIAGNOSIS — M79676 Pain in unspecified toe(s): Secondary | ICD-10-CM

## 2014-09-10 NOTE — Progress Notes (Signed)
Presents today chief complaint of painful elongated toenails.  Objective: Pulses are palpable bilateral nails are thick, yellow dystrophic onychomycosis and painful palpation.   Assessment: Onychomycosis with pain in limb.  Plan: Treatment of nails in thickness and length as covered service secondary to pain.  

## 2014-10-29 ENCOUNTER — Ambulatory Visit: Payer: Medicare Other

## 2014-11-05 ENCOUNTER — Ambulatory Visit: Payer: Medicare Other

## 2014-11-13 ENCOUNTER — Ambulatory Visit (INDEPENDENT_AMBULATORY_CARE_PROVIDER_SITE_OTHER): Payer: Medicare Other | Admitting: Podiatry

## 2014-11-13 DIAGNOSIS — B351 Tinea unguium: Secondary | ICD-10-CM

## 2014-11-13 DIAGNOSIS — Q828 Other specified congenital malformations of skin: Secondary | ICD-10-CM | POA: Diagnosis not present

## 2014-11-13 DIAGNOSIS — M79676 Pain in unspecified toe(s): Secondary | ICD-10-CM

## 2014-11-13 NOTE — Progress Notes (Signed)
Patient ID: Toni Berger, female   DOB: October 09, 1945, 69 y.o.   MRN: [id number]  Subjective: 69 y.o.-year-old female returns the office today for painful, elongated, thickened toenails. Also states she has painful callus on the bottom of her left foot. Denies any redness or drainage around the nails. Denies any acute changes since last appointment and no new complaints today. Denies any systemic complaints such as fevers, chills, nausea, vomiting.   Objective: AAO 3, NAD DP/PT pulses palpable, CRT less than 3 seconds Protective sensation intact with Simms Weinstein monofilament, Achilles tendon reflex intact.  Nails hypertrophic, dystrophic, elongated, brittle, discolored 10. There is tenderness overlying these nails. There is no surrounding erythema or drainage along the nail sites. Annular hyperkerotic lesions left foot submetatarsal 1 and 2. Upon debridement, no underlying ulceration/draiange/or other clinical signs of infection.  No open lesions or otherpre-ulcerative lesions are identified. No other areas of tenderness bilateral lower extremities. No overlying edema, erythema, increased warmth. No pain with calf compression, swelling, warmth, erythema.  Assessment: Patient presents with symptomatic onychomycosis, porokertosis   Plan: -Treatment options including alternatives, risks, complications were discussed -Nails sharply debrided 10 without complication/bleeding. -Hyperkerotic lesions sharply debrided x 2 without complications/bleeding  -Discussed daily foot inspection. If there are any changes, to call the office immediately.  -Follow-up in 3 months or sooner if any problems are to arise. In the meantime, encouraged to call the office with any questions, concerns, changes symptoms.

## 2014-12-10 ENCOUNTER — Ambulatory Visit: Payer: Medicare Other

## 2014-12-17 DIAGNOSIS — E782 Mixed hyperlipidemia: Secondary | ICD-10-CM | POA: Diagnosis not present

## 2014-12-24 DIAGNOSIS — I10 Essential (primary) hypertension: Secondary | ICD-10-CM | POA: Diagnosis not present

## 2014-12-24 DIAGNOSIS — E782 Mixed hyperlipidemia: Secondary | ICD-10-CM | POA: Diagnosis not present

## 2014-12-24 DIAGNOSIS — G2581 Restless legs syndrome: Secondary | ICD-10-CM | POA: Diagnosis not present

## 2015-01-07 ENCOUNTER — Ambulatory Visit (INDEPENDENT_AMBULATORY_CARE_PROVIDER_SITE_OTHER): Payer: Medicare Other | Admitting: Podiatry

## 2015-01-07 ENCOUNTER — Encounter: Payer: Self-pay | Admitting: Podiatry

## 2015-01-07 DIAGNOSIS — M79676 Pain in unspecified toe(s): Secondary | ICD-10-CM | POA: Diagnosis not present

## 2015-01-07 DIAGNOSIS — B351 Tinea unguium: Secondary | ICD-10-CM | POA: Diagnosis not present

## 2015-01-07 NOTE — Progress Notes (Signed)
Presents today chief complaint of painful elongated toenails.  Objective: Pulses are palpable bilateral nails are thick, yellow dystrophic onychomycosis and painful palpation.   Assessment: Onychomycosis with pain in limb.  Plan: Treatment of nails in thickness and length as covered service secondary to pain.  

## 2015-03-11 ENCOUNTER — Ambulatory Visit (INDEPENDENT_AMBULATORY_CARE_PROVIDER_SITE_OTHER): Payer: Medicare Other | Admitting: Podiatry

## 2015-03-11 DIAGNOSIS — B351 Tinea unguium: Secondary | ICD-10-CM

## 2015-03-11 DIAGNOSIS — M79676 Pain in unspecified toe(s): Secondary | ICD-10-CM | POA: Diagnosis not present

## 2015-03-11 DIAGNOSIS — Q828 Other specified congenital malformations of skin: Secondary | ICD-10-CM

## 2015-03-11 NOTE — Progress Notes (Signed)
Patient presents to the office today with a chief complaint of painful elongated toenails and hyperkeratotic lesions.  Objective: Pulses are palpable bilateral. Nails are thick yellow dystrophic clinically mycotic and painful palpation.  Assessment: Pain in limb secondary to onychomycosis 1 through 5 bilateral and hyperkeratotic lesions.  Plan: Debridement of nails 1 through 5 bilateral covered service secondary to pain and debridement hyperkeratotic lesions.  

## 2015-05-13 ENCOUNTER — Encounter: Payer: Self-pay | Admitting: Podiatry

## 2015-05-13 ENCOUNTER — Ambulatory Visit (INDEPENDENT_AMBULATORY_CARE_PROVIDER_SITE_OTHER): Payer: Medicare Other | Admitting: Podiatry

## 2015-05-13 DIAGNOSIS — B351 Tinea unguium: Secondary | ICD-10-CM | POA: Diagnosis not present

## 2015-05-13 DIAGNOSIS — Q828 Other specified congenital malformations of skin: Secondary | ICD-10-CM | POA: Diagnosis not present

## 2015-05-13 DIAGNOSIS — M79676 Pain in unspecified toe(s): Secondary | ICD-10-CM

## 2015-05-13 NOTE — Progress Notes (Signed)
She presents today complaining of painful elongated toenails and calluses to the plantar aspect of her left foot. She states there is been no change in her past medical history medications allergy surgeries or social history.  Objective: Vital signs are stable alert and oriented 3. Pulses are palpable bilateral. No change in physical exam her nails are thick yellow dystrophic with mycotic porokeratotic lesions 2 plantar aspect of the left foot. Sub-first and sub-second metatarsophalangeal joints.  Assessment: Porokeratosis inner aspect left foot. Pain in limb secondary to onychomycosis 1 through 5 bilateral.  Plan: Debridement of nails 1 through 5 bilateral. Debridement of porokeratotic lesions left foot. Follow up with her in 1 month.

## 2015-06-19 ENCOUNTER — Encounter: Payer: Self-pay | Admitting: Podiatry

## 2015-06-19 ENCOUNTER — Ambulatory Visit (INDEPENDENT_AMBULATORY_CARE_PROVIDER_SITE_OTHER): Payer: Medicare Other | Admitting: Podiatry

## 2015-06-19 VITALS — BP 160/83 | HR 71 | Resp 18

## 2015-06-19 DIAGNOSIS — E782 Mixed hyperlipidemia: Secondary | ICD-10-CM | POA: Diagnosis not present

## 2015-06-19 DIAGNOSIS — G2581 Restless legs syndrome: Secondary | ICD-10-CM | POA: Diagnosis not present

## 2015-06-19 DIAGNOSIS — I10 Essential (primary) hypertension: Secondary | ICD-10-CM | POA: Diagnosis not present

## 2015-06-19 DIAGNOSIS — M722 Plantar fascial fibromatosis: Secondary | ICD-10-CM

## 2015-06-19 NOTE — Progress Notes (Signed)
She presents today with chief complaint of bilateral heel pain times the past several weeks. She states that she had flattened the back of a pair of half shoes and was walking on the heel of a hallux used and developed pain in her bilateral heels. She was to know if this is the cause of the pain in her heels and what we can do about it.  Objective: Vital signs are stable she is alert and oriented 3 pulses remain palpable bilateral. She has pain on palpation medial calcaneal tubercles bilateral they do appear to be slightly inflamed and irritated with hyperkeratosis plantar aspect of the heel secondary to rubbing.  Assessment: Probable plantar fasciitis bursitis due to pressure and irritation.  Plan: I offered an her injection and she declined we discussed appropriate shoe gear stretching exercises and ice therapy L follow-up with her as needed.

## 2015-06-24 ENCOUNTER — Ambulatory Visit: Payer: Medicare Other | Admitting: Podiatry

## 2015-06-25 DIAGNOSIS — I10 Essential (primary) hypertension: Secondary | ICD-10-CM | POA: Diagnosis not present

## 2015-06-25 DIAGNOSIS — G2581 Restless legs syndrome: Secondary | ICD-10-CM | POA: Diagnosis not present

## 2015-07-15 ENCOUNTER — Ambulatory Visit (INDEPENDENT_AMBULATORY_CARE_PROVIDER_SITE_OTHER): Payer: Medicare Other | Admitting: Podiatry

## 2015-07-15 ENCOUNTER — Encounter: Payer: Self-pay | Admitting: Podiatry

## 2015-07-15 VITALS — BP 141/80 | HR 69 | Resp 12

## 2015-07-15 DIAGNOSIS — B351 Tinea unguium: Secondary | ICD-10-CM | POA: Diagnosis not present

## 2015-07-15 DIAGNOSIS — Q828 Other specified congenital malformations of skin: Secondary | ICD-10-CM | POA: Diagnosis not present

## 2015-07-15 DIAGNOSIS — M722 Plantar fascial fibromatosis: Secondary | ICD-10-CM

## 2015-07-15 DIAGNOSIS — M79671 Pain in right foot: Secondary | ICD-10-CM

## 2015-07-15 DIAGNOSIS — M79672 Pain in left foot: Secondary | ICD-10-CM

## 2015-07-15 DIAGNOSIS — M79676 Pain in unspecified toe(s): Secondary | ICD-10-CM | POA: Diagnosis not present

## 2015-07-15 NOTE — Progress Notes (Signed)
She presents today for follow-up of her plantar fasciitis to her left heel which she states seems to be doing much better. She also like to have her toenails cut and her calluses trimmed.  Objective: Vital signs are stable alert and oriented 3. Pulses are strongly palpable. Neurologic sensorium is intact. No pain on palpation medial calcaneal tubercle of the left heel. Multiple porokeratosis plantar aspect of the bilateral foot including the forefoot for second third metatarsophalangeal joints. Her toenails are thick elongated dystrophic possibly mycotic.  Assessment: Pain in limb secondary to onychomycosis and porokeratosis. Resolving plantar fasciitis left foot.  Plan: Debrided nails and reactive hyperkeratoses bilateral.  Roselind Messier DPM

## 2015-09-18 ENCOUNTER — Ambulatory Visit: Payer: Medicare Other | Admitting: Podiatry

## 2015-09-18 DIAGNOSIS — R55 Syncope and collapse: Secondary | ICD-10-CM | POA: Diagnosis not present

## 2015-09-23 ENCOUNTER — Encounter: Payer: Self-pay | Admitting: Emergency Medicine

## 2015-09-23 ENCOUNTER — Emergency Department
Admission: EM | Admit: 2015-09-23 | Discharge: 2015-09-23 | Disposition: A | Discharge status: Home / Self Care | Payer: Medicare Other | Attending: Emergency Medicine | Admitting: Emergency Medicine

## 2015-09-23 DIAGNOSIS — S20212A Contusion of left front wall of thorax, initial encounter: Secondary | ICD-10-CM | POA: Diagnosis not present

## 2015-09-23 DIAGNOSIS — F419 Anxiety disorder, unspecified: Secondary | ICD-10-CM | POA: Insufficient documentation

## 2015-09-23 DIAGNOSIS — W01198A Fall on same level from slipping, tripping and stumbling with subsequent striking against other object, initial encounter: Secondary | ICD-10-CM | POA: Insufficient documentation

## 2015-09-23 DIAGNOSIS — S7002XA Contusion of left hip, initial encounter: Secondary | ICD-10-CM | POA: Diagnosis not present

## 2015-09-23 DIAGNOSIS — R0789 Other chest pain: Secondary | ICD-10-CM | POA: Diagnosis not present

## 2015-09-23 DIAGNOSIS — Z79899 Other long term (current) drug therapy: Secondary | ICD-10-CM | POA: Diagnosis not present

## 2015-09-23 DIAGNOSIS — R58 Hemorrhage, not elsewhere classified: Secondary | ICD-10-CM | POA: Diagnosis not present

## 2015-09-23 DIAGNOSIS — K625 Hemorrhage of anus and rectum: Secondary | ICD-10-CM | POA: Diagnosis not present

## 2015-09-23 DIAGNOSIS — I10 Essential (primary) hypertension: Secondary | ICD-10-CM | POA: Insufficient documentation

## 2015-09-23 DIAGNOSIS — Y998 Other external cause status: Secondary | ICD-10-CM | POA: Diagnosis not present

## 2015-09-23 DIAGNOSIS — Y9389 Activity, other specified: Secondary | ICD-10-CM | POA: Diagnosis not present

## 2015-09-23 DIAGNOSIS — Y9289 Other specified places as the place of occurrence of the external cause: Secondary | ICD-10-CM | POA: Diagnosis not present

## 2015-09-23 LAB — COMPREHENSIVE METABOLIC PANEL
ALK PHOS: 98 U/L (ref 38–126)
ALT: 19 U/L (ref 14–54)
AST: 23 U/L (ref 15–41)
Albumin: 4.6 g/dL (ref 3.5–5.0)
Anion gap: 11 (ref 5–15)
BUN: 17 mg/dL (ref 6–20)
CALCIUM: 9.8 mg/dL (ref 8.9–10.3)
CO2: 25 mmol/L (ref 22–32)
CREATININE: 0.85 mg/dL (ref 0.44–1.00)
Chloride: 104 mmol/L (ref 101–111)
Glucose, Bld: 102 mg/dL — ABNORMAL HIGH (ref 65–99)
Potassium: 3.6 mmol/L (ref 3.5–5.1)
Sodium: 140 mmol/L (ref 135–145)
Total Bilirubin: 1 mg/dL (ref 0.3–1.2)
Total Protein: 8 g/dL (ref 6.5–8.1)

## 2015-09-23 LAB — URINALYSIS COMPLETE WITH MICROSCOPIC (ARMC ONLY)
BILIRUBIN URINE: NEGATIVE
Bacteria, UA: NONE SEEN
Glucose, UA: NEGATIVE mg/dL
LEUKOCYTES UA: NEGATIVE
Nitrite: NEGATIVE
PROTEIN: NEGATIVE mg/dL
SPECIFIC GRAVITY, URINE: 1.002 — AB (ref 1.005–1.030)
SQUAMOUS EPITHELIAL / LPF: NONE SEEN
pH: 6 (ref 5.0–8.0)

## 2015-09-23 LAB — CBC
HCT: 36.3 % (ref 35.0–47.0)
Hemoglobin: 12.1 g/dL (ref 12.0–16.0)
MCH: 28.4 pg (ref 26.0–34.0)
MCHC: 33.4 g/dL (ref 32.0–36.0)
MCV: 85.1 fL (ref 80.0–100.0)
PLATELETS: 315 10*3/uL (ref 150–440)
RBC: 4.26 MIL/uL (ref 3.80–5.20)
RDW: 13.2 % (ref 11.5–14.5)
WBC: 12.4 10*3/uL — AB (ref 3.6–11.0)

## 2015-09-23 LAB — LIPASE, BLOOD: Lipase: 41 U/L (ref 11–51)

## 2015-09-23 NOTE — Discharge Instructions (Signed)
You were evaluated for rectal bleeding, and no serious amount of bleeding is suspected. We discussed, your exam and laboratory evaluation are reassuring in the emergency department.  You do need to follow with primary care doctor this week for both this rectal bleeding episode, as well as the episode of passing out from last week.  Return to the emergency department for any worsening condition including chest pain, trouble breathing, a passing out episode, dizziness, headache, confusion altered mental status, weakness or numbness, or any new or worsening rectal bleeding, or abdominal pain.   Chest Contusion A chest contusion is a deep bruise on your chest area. Contusions are the result of an injury that caused bleeding under the skin. A chest contusion may involve bruising of the skin, muscles, or ribs. The contusion may turn blue, purple, or yellow. Minor injuries will give you a painless contusion, but more severe contusions may stay painful and swollen for a few weeks. CAUSES  A contusion is usually caused by a blow, trauma, or direct force to an area of the body. SYMPTOMS   Swelling and redness of the injured area.  Discoloration of the injured area.  Tenderness and soreness of the injured area.  Pain. DIAGNOSIS  The diagnosis can be made by taking a history and performing a physical exam. An X-ray, CT scan, or MRI may be needed to determine if there were any associated injuries, such as broken bones (fractures) or internal injuries. TREATMENT  Often, the best treatment for a chest contusion is resting, icing, and applying cold compresses to the injured area. Deep breathing exercises may be recommended to reduce the risk of pneumonia. Over-the-counter medicines may also be recommended for pain control. HOME CARE INSTRUCTIONS   Put ice on the injured area.  Put ice in a plastic bag.  Place a towel between your skin and the bag.  Leave the ice on for 15-20 minutes, 03-04 times a  day.  Only take over-the-counter or prescription medicines as directed by your caregiver. Your caregiver may recommend avoiding anti-inflammatory medicines (aspirin, ibuprofen, and naproxen) for 48 hours because these medicines may increase bruising.  Rest the injured area.  Perform deep-breathing exercises as directed by your caregiver.  Stop smoking if you smoke.  Do not lift objects over 5 pounds (2.3 kg) for 3 days or longer if recommended by your caregiver. SEEK IMMEDIATE MEDICAL CARE IF:   You have increased bruising or swelling.  You have pain that is getting worse.  You have difficulty breathing.  You have dizziness, weakness, or fainting.  You have blood in your urine or stool.  You cough up or vomit blood.  Your swelling or pain is not relieved with medicines. MAKE SURE YOU:   Understand these instructions.  Will watch your condition.  Will get help right away if you are not doing well or get worse.   This information is not intended to replace advice given to you by your health care provider. Make sure you discuss any questions you have with your health care provider.   Document Released: 05/26/2001 Document Revised: 05/25/2012 Document Reviewed: 02/22/2012 Elsevier Interactive Patient Education 2016 [employer]  Gastrointestinal Bleeding Gastrointestinal bleeding is bleeding somewhere along the path that food travels through the body (digestive tract). This path is anywhere between the mouth and the opening of the butt (anus). You may have blood in your throw up (vomit) or in your poop (stools). If there is a lot of bleeding, you may need to stay  in the hospital. [name]  Only take medicine as told by your doctor.  Eat foods with fiber such as whole grains, fruits, and vegetables. You can also try eating 1 to 3 prunes a day.  Drink enough fluids to keep your pee (urine) clear or pale yellow. GET HELP RIGHT AWAY IF:   Your bleeding gets worse.  You  feel dizzy, weak, or you pass out (faint).  You have bad cramps in your back or belly (abdomen).  You have large blood clumps (clots) in your poop.  Your problems are getting worse. MAKE SURE YOU:   Understand these instructions.  Will watch your condition.  Will get help right away if you are not doing well or get worse.   This information is not intended to replace advice given to you by your health care provider. Make sure you discuss any questions you have with your health care provider.   Document Released: 06/09/2008 Document Revised: 08/17/2012 Document Reviewed: 02/18/2015 Elsevier Interactive Patient Education [employer].
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
## 2015-09-23 NOTE — ED Notes (Signed)
Patient is very anxious.  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  Emotional support given with minimal effect.  Plan of care discussed.  Continue to monitor.

## 2015-09-23 NOTE — ED Notes (Signed)
Brought by ems for "hemorrhage"  Pt was bleeding when she wipes .  Has had hysterectomy.

## 2015-09-23 NOTE — ED Notes (Signed)
AAOx3.  Skin warm and dry.  Ambulates with easy and steady gait.  Patient remains very anxious and rushed.  Denies any questions related to care and follow up care.  NAD.

## 2015-10-07 DIAGNOSIS — G2581 Restless legs syndrome: Secondary | ICD-10-CM | POA: Diagnosis not present

## 2015-10-07 DIAGNOSIS — R42 Dizziness and giddiness: Secondary | ICD-10-CM | POA: Diagnosis not present

## 2015-10-07 DIAGNOSIS — M549 Dorsalgia, unspecified: Secondary | ICD-10-CM | POA: Diagnosis not present

## 2015-10-07 DIAGNOSIS — R55 Syncope and collapse: Secondary | ICD-10-CM | POA: Diagnosis not present

## 2015-10-07 DIAGNOSIS — E785 Hyperlipidemia, unspecified: Secondary | ICD-10-CM | POA: Diagnosis not present

## 2015-10-07 DIAGNOSIS — F419 Anxiety disorder, unspecified: Secondary | ICD-10-CM | POA: Diagnosis not present

## 2015-10-07 DIAGNOSIS — I10 Essential (primary) hypertension: Secondary | ICD-10-CM | POA: Diagnosis not present

## 2015-10-07 DIAGNOSIS — S060X9A Concussion with loss of consciousness of unspecified duration, initial encounter: Secondary | ICD-10-CM | POA: Diagnosis not present

## 2015-10-08 DIAGNOSIS — Z79899 Other long term (current) drug therapy: Secondary | ICD-10-CM | POA: Diagnosis not present

## 2015-10-08 DIAGNOSIS — Z9181 History of falling: Secondary | ICD-10-CM | POA: Diagnosis not present

## 2015-10-08 DIAGNOSIS — R0789 Other chest pain: Secondary | ICD-10-CM | POA: Diagnosis not present

## 2015-10-10 DIAGNOSIS — F419 Anxiety disorder, unspecified: Secondary | ICD-10-CM | POA: Diagnosis not present

## 2015-10-10 DIAGNOSIS — E785 Hyperlipidemia, unspecified: Secondary | ICD-10-CM | POA: Diagnosis not present

## 2015-10-10 DIAGNOSIS — I10 Essential (primary) hypertension: Secondary | ICD-10-CM | POA: Diagnosis not present

## 2015-10-10 DIAGNOSIS — G2581 Restless legs syndrome: Secondary | ICD-10-CM | POA: Diagnosis not present

## 2015-10-11 ENCOUNTER — Other Ambulatory Visit: Payer: Self-pay

## 2015-10-11 ENCOUNTER — Other Ambulatory Visit: Payer: Self-pay | Admitting: Physician Assistant

## 2015-10-11 DIAGNOSIS — S0990XA Unspecified injury of head, initial encounter: Secondary | ICD-10-CM

## 2015-10-11 DIAGNOSIS — R410 Disorientation, unspecified: Secondary | ICD-10-CM

## 2015-10-11 DIAGNOSIS — R55 Syncope and collapse: Secondary | ICD-10-CM | POA: Diagnosis not present

## 2015-10-11 DIAGNOSIS — R296 Repeated falls: Secondary | ICD-10-CM | POA: Diagnosis not present

## 2015-10-11 DIAGNOSIS — R269 Unspecified abnormalities of gait and mobility: Secondary | ICD-10-CM | POA: Diagnosis not present

## 2015-10-15 DIAGNOSIS — R269 Unspecified abnormalities of gait and mobility: Secondary | ICD-10-CM | POA: Diagnosis not present

## 2015-10-15 DIAGNOSIS — R296 Repeated falls: Secondary | ICD-10-CM | POA: Diagnosis not present

## 2015-10-15 DIAGNOSIS — R55 Syncope and collapse: Secondary | ICD-10-CM | POA: Diagnosis not present

## 2015-10-17 DIAGNOSIS — R296 Repeated falls: Secondary | ICD-10-CM | POA: Diagnosis not present

## 2015-10-17 DIAGNOSIS — R269 Unspecified abnormalities of gait and mobility: Secondary | ICD-10-CM | POA: Diagnosis not present

## 2015-10-17 DIAGNOSIS — R55 Syncope and collapse: Secondary | ICD-10-CM | POA: Diagnosis not present

## 2015-10-21 DIAGNOSIS — G2581 Restless legs syndrome: Secondary | ICD-10-CM | POA: Diagnosis not present

## 2015-10-21 DIAGNOSIS — S0990XA Unspecified injury of head, initial encounter: Secondary | ICD-10-CM | POA: Diagnosis not present

## 2015-10-21 DIAGNOSIS — G4700 Insomnia, unspecified: Secondary | ICD-10-CM | POA: Diagnosis not present

## 2015-10-21 DIAGNOSIS — S2239XA Fracture of one rib, unspecified side, initial encounter for closed fracture: Secondary | ICD-10-CM | POA: Diagnosis not present

## 2015-10-21 DIAGNOSIS — I10 Essential (primary) hypertension: Secondary | ICD-10-CM | POA: Diagnosis not present

## 2015-10-21 DIAGNOSIS — J00 Acute nasopharyngitis [common cold]: Secondary | ICD-10-CM | POA: Diagnosis not present

## 2015-10-21 DIAGNOSIS — F419 Anxiety disorder, unspecified: Secondary | ICD-10-CM | POA: Diagnosis not present

## 2015-10-21 DIAGNOSIS — G3184 Mild cognitive impairment, so stated: Secondary | ICD-10-CM | POA: Diagnosis not present

## 2015-10-22 ENCOUNTER — Ambulatory Visit: Payer: Medicare Other

## 2015-10-23 ENCOUNTER — Ambulatory Visit
Admission: RE | Admit: 2015-10-23 | Discharge: 2015-10-23 | Disposition: A | Discharge status: Home / Self Care | Payer: Medicare Other | Source: Ambulatory Visit | Attending: Physician Assistant | Admitting: Physician Assistant

## 2015-10-23 DIAGNOSIS — W19XXXA Unspecified fall, initial encounter: Secondary | ICD-10-CM | POA: Diagnosis not present

## 2015-10-23 DIAGNOSIS — S060X0A Concussion without loss of consciousness, initial encounter: Secondary | ICD-10-CM | POA: Insufficient documentation

## 2015-10-23 DIAGNOSIS — R9082 White matter disease, unspecified: Secondary | ICD-10-CM | POA: Insufficient documentation

## 2015-10-23 DIAGNOSIS — Z8673 Personal history of transient ischemic attack (TIA), and cerebral infarction without residual deficits: Secondary | ICD-10-CM | POA: Insufficient documentation

## 2015-10-23 DIAGNOSIS — R51 Headache: Secondary | ICD-10-CM | POA: Diagnosis not present

## 2015-10-23 DIAGNOSIS — R410 Disorientation, unspecified: Secondary | ICD-10-CM

## 2015-10-29 DIAGNOSIS — Y92009 Unspecified place in unspecified non-institutional (private) residence as the place of occurrence of the external cause: Secondary | ICD-10-CM | POA: Insufficient documentation

## 2015-10-29 DIAGNOSIS — Y92099 Unspecified place in other non-institutional residence as the place of occurrence of the external cause: Secondary | ICD-10-CM | POA: Diagnosis not present

## 2015-10-29 DIAGNOSIS — Z8673 Personal history of transient ischemic attack (TIA), and cerebral infarction without residual deficits: Secondary | ICD-10-CM | POA: Insufficient documentation

## 2015-10-29 DIAGNOSIS — R51 Headache: Secondary | ICD-10-CM | POA: Diagnosis not present

## 2015-10-29 DIAGNOSIS — W19XXXD Unspecified fall, subsequent encounter: Secondary | ICD-10-CM | POA: Diagnosis not present

## 2015-10-29 DIAGNOSIS — W19XXXA Unspecified fall, initial encounter: Secondary | ICD-10-CM | POA: Insufficient documentation

## 2015-10-30 DIAGNOSIS — R296 Repeated falls: Secondary | ICD-10-CM | POA: Diagnosis not present

## 2015-10-30 DIAGNOSIS — R55 Syncope and collapse: Secondary | ICD-10-CM | POA: Diagnosis not present

## 2015-10-30 DIAGNOSIS — R269 Unspecified abnormalities of gait and mobility: Secondary | ICD-10-CM | POA: Diagnosis not present

## 2015-10-31 DIAGNOSIS — R55 Syncope and collapse: Secondary | ICD-10-CM | POA: Diagnosis not present

## 2015-10-31 DIAGNOSIS — R269 Unspecified abnormalities of gait and mobility: Secondary | ICD-10-CM | POA: Diagnosis not present

## 2015-10-31 DIAGNOSIS — R296 Repeated falls: Secondary | ICD-10-CM | POA: Diagnosis not present

## 2015-11-08 ENCOUNTER — Encounter: Payer: Self-pay | Admitting: Sports Medicine

## 2015-11-08 ENCOUNTER — Ambulatory Visit (INDEPENDENT_AMBULATORY_CARE_PROVIDER_SITE_OTHER): Payer: Medicare Other | Admitting: Sports Medicine

## 2015-11-08 DIAGNOSIS — B351 Tinea unguium: Secondary | ICD-10-CM | POA: Diagnosis not present

## 2015-11-08 DIAGNOSIS — M79671 Pain in right foot: Secondary | ICD-10-CM | POA: Diagnosis not present

## 2015-11-08 DIAGNOSIS — Q828 Other specified congenital malformations of skin: Secondary | ICD-10-CM | POA: Diagnosis not present

## 2015-11-08 DIAGNOSIS — M79672 Pain in left foot: Secondary | ICD-10-CM | POA: Diagnosis not present

## 2015-11-08 NOTE — Progress Notes (Signed)
Patient ID: Toni Berger, female   DOB: Jan 13, 1946, 70 y.o.   MRN: [id number] Subjective: Toni Berger is a 70 y.o. female patient seen today in office with complaint of painful callus and thickened and elongated toenails; unable to trim. Patient denies history of Diabetes, Neuropathy, or Vascular disease. 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 Patient has no other pedal complaints at this time.   Patient Active Problem List   Diagnosis Date Noted  . Routine general medical examination at a health care facility 08/12/2011  . HYPERTENSION, BENIGN 11/12/2010  . OSTEOARTHRITIS 08/13/2010  . SEBACEOUS CYST 01/30/2010  . 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 HYPERLIPIDEMIA 02/09/2008  . ANXIETY 02/09/2008  . RESTLESS LEG SYNDROME 02/09/2008  . ALLERGIC RHINITIS 02/09/2008    Current Outpatient Prescriptions on File Prior to Visit  Medication Sig Dispense Refill  . amLODipine (NORVASC) 10 MG tablet Take 2.5-5 mg by mouth daily.    . calcium carbonate (OS-CAL) 600 MG TABS Take 600 mg by mouth 2 (two) times daily with a meal.    . cholecalciferol (VITAMIN D) 1000 UNITS tablet Take 1,000 Units by mouth daily.    . clonazePAM (KLONOPIN) 1 MG tablet Take 1-3 tablets by mouth at bedtime as needed. 90 tablet 1  . clonazePAM (KLONOPIN) 1 MG tablet Take 1-2 tablets (1-2 mg total) by mouth at bedtime. 7 tablet 0  . ibuprofen (ADVIL) 200 MG tablet Take 200 mg by mouth every 6 (six) hours as needed.      Marland Kitchen lisinopril (PRINIVIL,ZESTRIL) 40 MG tablet Take 1 tablet (40 mg total) by mouth daily. 90 tablet 3  . pravastatin (PRAVACHOL) 40 MG tablet Take 20 mg by mouth daily.     No current facility-administered medications on file prior to visit.    Allergies  Allergen Reactions  . Carbidopa-Levodopa     REACTION: headache, stomach pain, chest pain and seeing purple colors  . Ciprofloxacin   . Metoprolol     To low heart rate/ BP  . Povidone-Iodine   . Propoxyphene Hcl   .  Sulfonamide Derivatives     "stop breathing, eyes roll in back of head and fingers web"  . Tetracycline     Objective: Physical Exam  General: Well developed, nourished, no acute distress, awake, alert and oriented x 3  Vascular: Dorsalis pedis artery 2/4 bilateral, Posterior tibial artery 2/4 bilateral, skin temperature warm to warm proximal to distal bilateral lower extremities, no varicosities, pedal hair present bilateral.  Neurological: Gross sensation present via light touch bilateral.   Dermatological: Skin is warm, dry, and supple bilateral, Nails 1-10 are tender, long, thick, and discolored with mild subungal debris, no webspace macerations present bilateral, no open lesions present bilateral, + hyperkeratotic tissue with nucleated core present sub met 1 and 2 on left. No signs of infection bilateral.  Musculoskeletal: Bunion and hammertoe deformities noted bilateral. Muscular strength within normal limits without pain on range of motion. No pain with calf compression bilateral.  Assessment and Plan:  Problem List Items Addressed This Visit    None    Visit Diagnoses    Dermatophytosis of nail    -  Primary    Porokeratosis        Pain in both feet          -Examined patient.  -Discussed treatment options for painful keratotic lesions and mycotic nails. -Mechanically debrided keratotic lesions x 2 using chisel blade and reduced mycotic nails with sterile nail  nipper and dremel nail file without incident. -Recommend good supportive shoes daily for foot type. -Patient to return in 3 months/as needed for follow up evaluation or sooner if symptoms worsen.  Landis Martins, DPM

## 2015-11-20 DIAGNOSIS — M6281 Muscle weakness (generalized): Secondary | ICD-10-CM | POA: Diagnosis not present

## 2015-11-20 DIAGNOSIS — I69331 Monoplegia of upper limb following cerebral infarction affecting right dominant side: Secondary | ICD-10-CM | POA: Diagnosis not present

## 2015-11-20 DIAGNOSIS — Z9181 History of falling: Secondary | ICD-10-CM | POA: Diagnosis not present

## 2015-11-21 DIAGNOSIS — Z9181 History of falling: Secondary | ICD-10-CM | POA: Diagnosis not present

## 2015-11-21 DIAGNOSIS — I69331 Monoplegia of upper limb following cerebral infarction affecting right dominant side: Secondary | ICD-10-CM | POA: Diagnosis not present

## 2015-11-21 DIAGNOSIS — M6281 Muscle weakness (generalized): Secondary | ICD-10-CM | POA: Diagnosis not present

## 2015-12-04 DIAGNOSIS — Z8673 Personal history of transient ischemic attack (TIA), and cerebral infarction without residual deficits: Secondary | ICD-10-CM | POA: Diagnosis not present

## 2015-12-04 DIAGNOSIS — I10 Essential (primary) hypertension: Secondary | ICD-10-CM | POA: Diagnosis not present

## 2015-12-04 DIAGNOSIS — Z9181 History of falling: Secondary | ICD-10-CM | POA: Diagnosis not present

## 2015-12-04 DIAGNOSIS — M6281 Muscle weakness (generalized): Secondary | ICD-10-CM | POA: Diagnosis not present

## 2015-12-09 ENCOUNTER — Ambulatory Visit (INDEPENDENT_AMBULATORY_CARE_PROVIDER_SITE_OTHER): Payer: Medicare Other | Admitting: Family Medicine

## 2015-12-09 VITALS — BP 110/68 | HR 61 | Temp 98.1°F | Ht 67.0 in | Wt 178.0 lb

## 2015-12-09 DIAGNOSIS — R404 Transient alteration of awareness: Secondary | ICD-10-CM

## 2015-12-09 DIAGNOSIS — R4020 Unspecified coma: Secondary | ICD-10-CM

## 2015-12-09 DIAGNOSIS — G2581 Restless legs syndrome: Secondary | ICD-10-CM | POA: Diagnosis not present

## 2015-12-09 DIAGNOSIS — I639 Cerebral infarction, unspecified: Secondary | ICD-10-CM | POA: Diagnosis not present

## 2015-12-09 DIAGNOSIS — I638 Other cerebral infarction: Secondary | ICD-10-CM | POA: Diagnosis not present

## 2015-12-09 DIAGNOSIS — M75101 Unspecified rotator cuff tear or rupture of right shoulder, not specified as traumatic: Secondary | ICD-10-CM

## 2015-12-09 DIAGNOSIS — R55 Syncope and collapse: Secondary | ICD-10-CM

## 2015-12-09 DIAGNOSIS — I6389 Other cerebral infarction: Secondary | ICD-10-CM

## 2015-12-09 DIAGNOSIS — M7541 Impingement syndrome of right shoulder: Secondary | ICD-10-CM

## 2015-12-09 DIAGNOSIS — I6381 Other cerebral infarction due to occlusion or stenosis of small artery: Secondary | ICD-10-CM
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
## 2015-12-09 NOTE — Progress Notes (Signed)
Pre visit review using our clinic review tool, if applicable. No additional management support is needed unless otherwise documented below in the visit note. 

## 2015-12-13 ENCOUNTER — Telehealth: Payer: Self-pay | Admitting: *Deleted

## 2015-12-13 NOTE — Telephone Encounter (Signed)
Noted  

## 2015-12-13 NOTE — Telephone Encounter (Signed)
FYI. Thanks.

## 2015-12-13 NOTE — Telephone Encounter (Signed)
FYI :Patient stated that she will not go to the cardiologist, she stated that if any issues arise she will seek medical attention. She cancelled her appt with cardiologist.

## 2015-12-15 ENCOUNTER — Encounter: Payer: Self-pay | Admitting: Family Medicine

## 2015-12-15 DIAGNOSIS — R4020 Unspecified coma: Secondary | ICD-10-CM

## 2015-12-15 DIAGNOSIS — M7540 Impingement syndrome of unspecified shoulder: Secondary | ICD-10-CM | POA: Insufficient documentation

## 2015-12-15 DIAGNOSIS — I6381 Other cerebral infarction due to occlusion or stenosis of small artery: Secondary | ICD-10-CM | POA: Insufficient documentation

## 2015-12-15 HISTORY — DX: Unspecified coma: R40.20
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
## 2015-12-15 NOTE — Progress Notes (Signed)
Patient ID: FRIDA DEPRIMO, female   DOB: 1946-05-18, 70 y.o.   MRN: [id number]  [name], MD Phone: [phone]  Toni Berger is a 70 y.o. female who presents today for new patient visit.  Patient presents as a new patient. She notes she is followed by neurology for a possible stroke. She notes some time several months ago she lost about 6 hours of time. States she passed out. Had no preceding symptoms. No chest pain, shortness breath, or palpitations. States her husband sat with her as he has dementia and was unable to contact anybody regarding the fall. States she had an EKG that was normal per her report and she was following up with her PCP who wanted to do a cardiac workup though the patient declined this and never followed up for this. She eventually had a CT scan that had a remote lacunar infarct. Also had chronic microvascular changes as well. She reports that after the event she had been walking "funky" and could not see well. She notes her vision is back to normal. Balance is at baseline. She did undergo PT briefly for this. She is continuing to follow with neurology. She has been taking Plavix.  She reports nerve damage to her right shoulder related to this syncopal episode. Notes it hurts to lift her right arm. Notes her right arm got more painful later on in the course as she was using this more as her prior stroke possibly affected her left side. She's been taking Advil for pain. No numbness or weakness in the arm. The arm does not hurt when sitting there. It hurts if she tries to lift it up above her head. She has no associated neck pain.  Restless leg syndrome. Notes she gets this at night. She takes Klonopin for this.  Active Ambulatory Problems    Diagnosis Date Noted  . HYPERLIPIDEMIA 02/09/2008  . ANXIETY 02/09/2008  . RESTLESS LEG SYNDROME 02/09/2008  . 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 ALLERGIC RHINITIS 02/09/2008  . SEBACEOUS CYST 01/30/2010  . OSTEOARTHRITIS  08/13/2010  . HYPERTENSION, BENIGN 11/12/2010  . Routine general medical examination at a health care facility 08/12/2011  . Remote lacunar infarction ([place]) 12/15/2015  . Loss of consciousness 12/15/2015  . Rotator cuff impingement syndrome 12/15/2015   Resolved Ambulatory Problems    Diagnosis Date Noted  . No Resolved Ambulatory Problems   Past Medical History  Diagnosis Date  . Allergy   . Anxiety   . Hyperlipidemia   . Fibrocystic breast   . Restless leg syndrome   . Arthritis   . Migraines   . Lipoma   . Osteoporosis, unspecified   . Hypertension   . Stroke (cerebrum) [facility])     Family History  Problem Relation Age of Onset  . Heart disease Mother   . Diabetes Mother   . Aneurysm Mother     AAA  . Heart failure Mother   . Colon cancer Neg Hx   . Breast cancer Neg Hx   . Heart failure Father   . Coronary artery disease Father     Social History   Social History  . Marital Status: Married    Spouse Name: N/A  . Number of Children: 1  . Years of Education: N/A   Occupational History  . retired Optometrist for town of [place]  . Smoking status: Never Smoker   . Smokeless tobacco: Never Used  . Alcohol Use: No  .  Drug Use: No  . Sexual Activity: Not on file   Other Topics Concern  . Not on file   Social History Narrative   Has [place]-- husband and then stepson.   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    ROS   General:  Negative for nexplained weight loss, fever Skin: Negative for new or changing mole, sore that won't heal HEENT: Negative for trouble hearing, trouble seeing, ringing in ears, mouth sores, hoarseness, change in voice, dysphagia. CV:  Negative for chest pain, dyspnea, edema, palpitations Resp: Negative for cough, dyspnea, hemoptysis GI: Negative for nausea, vomiting, diarrhea, constipation, abdominal pain, melena, hematochezia. GU: Negative for  dysuria, incontinence, urinary hesitance, hematuria, vaginal or penile discharge, polyuria, sexual difficulty, lumps in testicle or breasts MSK: Positive for muscle cramps or aches, joint pain or swelling Neuro: Negative for headaches, weakness, numbness, dizziness, passing out/fainting Psych: Negative for depression, anxiety, memory problems  Objective  Physical Exam Filed Vitals:   12/09/15 1329  BP: 110/68  Pulse: 61  Temp: 98.1 F (36.7 C)    BP Readings from Last 3 Encounters:  12/09/15 110/68  09/23/15 183/79  07/15/15 141/80   Wt Readings from Last 3 Encounters:  12/09/15 178 lb (80.74 kg)  09/23/15 168 lb (76.204 kg)  08/14/13 175 lb (79.379 kg)    Physical Exam  Constitutional: She is well-developed, well-nourished, and in no distress.  HENT:  Head: Normocephalic and atraumatic.  Right Ear: External ear normal.  Left Ear: External ear normal.  Mouth/Throat: Oropharynx is clear and moist. No oropharyngeal exudate.  Eyes: Conjunctivae are normal. Pupils are equal, round, and reactive to light.  Neck: Neck supple.  Cardiovascular: Normal rate, regular rhythm and normal heart sounds.  Exam reveals no gallop and no friction rub.   No murmur heard. Pulmonary/Chest: Effort normal and breath sounds normal. No respiratory distress. She has no wheezes. She has no rales.  Abdominal: Soft. Bowel sounds are normal. She exhibits no distension. There is no tenderness. There is no rebound and no guarding.  Musculoskeletal: She exhibits no edema.  Bilateral shoulders with no tenderness, right shoulder with discomfort on active and passive abduction and internal and external rotation, positive empty can on the right, left shoulder with full range of motion with no discomfort, negative empty can  Lymphadenopathy:    She has no cervical adenopathy.  Neurological: She is alert.  CN 2-12 intact, 5/5 strength in bilateral biceps, triceps, grip, quads, hamstrings, plantar and  dorsiflexion, sensation to light touch intact in bilateral UE and LE, normal gait, 2+ patellar reflexes  Skin: Skin is dry. She is not diaphoretic.     Assessment/Plan:   RESTLESS LEG SYNDROME Relatively well controlled on Klonopin. She will continue this for now.  Remote lacunar infarction [facility]) Patient with CT scan findings consistent with remote lacunar infarct. She is neurologically intact right now. She is on Plavix. She is on statin. Blood pressure is well-controlled. She is following with neurology for this. We'll continue to monitor.  Loss of consciousness Patient with an episode of loss of consciousness several months ago. Reportedly had normal EKG. We will request this. Discussed with the patient that I do not typically think of stroke as a common cause of loss of consciousness. Discussed more worrisome for cardiac cause given lack of preceding symptoms. Discussed that she would likely warrant a cardiac workup. We'll refer to cardiology. She is given return precautions.  Rotator cuff impingement syndrome  Discussed at length with the patient that her symptoms in her right shoulder most consistent with rotator cuff impingement or bursitis. Not consistent with nerve injury or stroke. Advised that given the fact that it hurts with movement in particular directions that it would be as consistent with rotator cuff injury. She has no neck discomfort to indicate radicular symptoms and that it would be odd for radicular symptoms to occur just in the shoulder and only with movement of her shoulder in certain positions. I discussed possible treatment regimens including physical therapy, home exercises, and injection. Patient opted for physical therapy and wanted [place] to request home PT for this issue as she is unable to leave the house due to her husband's condition. I advised that she likely would not meet criteria, though we could attempt this. She will continue to monitor. She's given return  precautions.    Orders Placed This Encounter  Procedures  . Ambulatory referral to Home Health    Referral Priority:  Routine    Referral Type:  Home Health Care    Referral Reason:  Specialty Services Required    Requested Specialty:  [place]    Number of Visits Requested:  1  . Ambulatory referral to Cardiology    Referral Priority:  Routine    Referral Type:  Consultation    Referral Reason:  Specialty Services Required    Requested Specialty:  Cardiology    Number of Visits Requested:  [place], MD [place]

## 2015-12-15 NOTE — Assessment & Plan Note (Signed)
Relatively well controlled on Klonopin. She will continue this for now.

## 2015-12-15 NOTE — Assessment & Plan Note (Signed)
Patient with CT scan findings consistent with remote lacunar infarct. She is neurologically intact right now. She is on Plavix. She is on statin. Blood pressure is well-controlled. She is following with neurology for this. We'll continue to monitor.

## 2015-12-15 NOTE — Assessment & Plan Note (Signed)
Patient with an episode of loss of consciousness several months ago. Reportedly had normal EKG. We will request this. Discussed with the patient that I do not typically think of stroke as a common cause of loss of consciousness. Discussed more worrisome for cardiac cause given lack of preceding symptoms. Discussed that she would likely warrant a cardiac workup. We'll refer to cardiology. She is given return precautions.

## 2015-12-24 ENCOUNTER — Ambulatory Visit: Payer: Medicare Other | Admitting: Cardiology

## 2016-01-09 ENCOUNTER — Ambulatory Visit (INDEPENDENT_AMBULATORY_CARE_PROVIDER_SITE_OTHER): Payer: Medicare Other | Admitting: Family Medicine

## 2016-01-09 ENCOUNTER — Encounter: Payer: Self-pay | Admitting: Family Medicine

## 2016-01-09 VITALS — BP 122/66 | HR 65 | Temp 97.7°F | Ht 67.0 in | Wt 176.4 lb

## 2016-01-09 DIAGNOSIS — R4020 Unspecified coma: Secondary | ICD-10-CM

## 2016-01-09 DIAGNOSIS — S2232XD Fracture of one rib, left side, subsequent encounter for fracture with routine healing: Secondary | ICD-10-CM

## 2016-01-09 DIAGNOSIS — M7541 Impingement syndrome of right shoulder: Secondary | ICD-10-CM

## 2016-01-09 DIAGNOSIS — M75101 Unspecified rotator cuff tear or rupture of right shoulder, not specified as traumatic: Secondary | ICD-10-CM | POA: Diagnosis not present

## 2016-01-09 DIAGNOSIS — E785 Hyperlipidemia, unspecified: Secondary | ICD-10-CM

## 2016-01-09 DIAGNOSIS — R404 Transient alteration of awareness: Secondary | ICD-10-CM | POA: Diagnosis not present

## 2016-01-09 DIAGNOSIS — S2239XA Fracture of one rib, unspecified side, initial encounter for closed fracture: Secondary | ICD-10-CM

## 2016-01-09 DIAGNOSIS — I639 Cerebral infarction, unspecified: Secondary | ICD-10-CM | POA: Diagnosis not present

## 2016-01-09 HISTORY — DX: Fracture of one rib, unspecified side, initial encounter for closed fracture: S22.39XA

## 2016-01-09 NOTE — Progress Notes (Signed)
Pre visit review using our clinic review tool, if applicable. No additional management support is needed unless otherwise documented below in the visit note. 

## 2016-01-09 NOTE — Patient Instructions (Signed)
Nice to see you. Please keep your appointment with physical therapy tomorrow. You can take Advil 200-400 milligrams every 6-8 hours for her pain.  we will have you return for cholesterol check fasting. If you develop chest pain, shortness of breath, palpitations, loss of consciousness is due to her change in symptoms please seek medical attention.

## 2016-01-09 NOTE — Assessment & Plan Note (Signed)
Patient reports no recurrence of this. Today she notes it was due to her hitting her head after falling. she does not want cardiology evaluation for this. She would prefer to continue to monitor this. She'll continue to monitor this. Return precautions.

## 2016-01-09 NOTE — Assessment & Plan Note (Signed)
I again discussed that her symptoms are most consistent with rotator cuff tendinitis or impingement. She will keep her appointment with PT tomorrow. She'll continue to monitor.

## 2016-01-09 NOTE — Progress Notes (Signed)
Patient ID: KARELIN DOCKUM, female   DOB: 09-18-1945, 70 y.o.   MRN: [id number]  [name], MD Phone: [phone]  Toni Berger is a 70 y.o. female who presents today for follow-up.  Right rotator cuff injury: Patient notes continued discomfort in her right shoulder particularly with flexing her shoulder and abducting her shoulder. Notes she can extend her shoulder with no issues. She notes the pain occurs in her anterior shoulder and down into her anterior bicep. She notes no neck pain. No numbness or weakness. No shooting pain into her arm.  Loss of consciousness: This occurred several months ago and has been fully evaluated. She is followed with neurology for this. Patient notes no repeated issues with this. She now states this occurred due to her slipping and hitting her head. She's not had any chest pain, shortness breath, or palpitations. No lightheadedness. She declined seeing cardiology.  HYPERLIPIDEMIA Symptoms Chest pain on exertion: no   Leg claudication:   No Medications: Compliance- taking pravastatin Right upper quadrant pain- no  Muscle aches- no  Rib fractures: Patient notes left-sided rib fractures still intermittently mildly uncomfortable. No bruising. No shortness of breath. Overall feels well though occasionally bothers her.  PMH: nonsmoker.   ROS see history of present illness  Objective  Physical Exam Filed Vitals:   01/09/16 1422  BP: 122/66  Pulse: 65  Temp: 97.7 F (36.5 C)    BP Readings from Last 3 Encounters:  01/09/16 122/66  12/09/15 110/68  09/23/15 183/79   Wt Readings from Last 3 Encounters:  01/09/16 176 lb 6.4 oz (80.015 kg)  12/09/15 178 lb (80.74 kg)  09/23/15 168 lb (76.204 kg)    Physical Exam  Constitutional: She is well-developed, well-nourished, and in no distress.  HENT:  Head: Normocephalic and atraumatic.  Cardiovascular: Normal rate, regular rhythm and normal heart sounds.   Pulmonary/Chest: Effort normal and  breath sounds normal.  Left posterior lateral ribs with minimal tenderness, no overlying skin changes or bruising, no bony defects, no flail chest  Musculoskeletal: She exhibits no edema.  Bilateral shoulders with no tenderness, right shoulder with discomfort on active and passive abduction and internal and external rotation, positive empty can and speeds on the right, unable to fully abduct her right arm actively or passively due to discomfort, left shoulder with full range of motion with no discomfort, negative empty can and speeds on the left  Neurological: She is alert. Gait normal.  Skin: She is not diaphoretic.     Assessment/Plan: Please see individual problem list.  Loss of consciousness Patient reports no recurrence of this. Today she notes it was due to her hitting her head after falling. she does not want cardiology evaluation for this. She would prefer to continue to monitor this. She'll continue to monitor this. Return precautions.  Rotator cuff impingement syndrome I again discussed that her symptoms are most consistent with rotator cuff tendinitis or impingement. She will keep her appointment with PT tomorrow. She'll continue to monitor.  Hyperlipidemia Tolerating pravastatin. We'll continue this. She'll return for fasting lipid panel.  Closed rib fracture Still with minimal pain. Slowly improving. Benign lung exam. No shortness of breath. She'll continue to monitor. If does not continue to improve would consider repeat imaging. Given return precautions.    Orders Placed This Encounter  Procedures  . Lipid Profile    Standing Status: Future     Number of Occurrences:      Standing Expiration Date: 01/08/2017  [name], MD [place]

## 2016-01-09 NOTE — Assessment & Plan Note (Signed)
Still with minimal pain. Slowly improving. Benign lung exam. No shortness of breath. She'll continue to monitor. If does not continue to improve would consider repeat imaging. Given return precautions.

## 2016-01-09 NOTE — Assessment & Plan Note (Signed)
Tolerating pravastatin. We'll continue this. She'll return for fasting lipid panel.

## 2016-01-14 ENCOUNTER — Ambulatory Visit: Payer: Medicare Other | Admitting: Sports Medicine

## 2016-01-17 ENCOUNTER — Encounter: Payer: Self-pay | Admitting: Sports Medicine

## 2016-01-17 ENCOUNTER — Ambulatory Visit (INDEPENDENT_AMBULATORY_CARE_PROVIDER_SITE_OTHER): Payer: Medicare Other | Admitting: Sports Medicine

## 2016-01-17 DIAGNOSIS — M79672 Pain in left foot: Secondary | ICD-10-CM | POA: Diagnosis not present

## 2016-01-17 DIAGNOSIS — Q828 Other specified congenital malformations of skin: Secondary | ICD-10-CM | POA: Diagnosis not present

## 2016-01-17 DIAGNOSIS — M79671 Pain in right foot: Secondary | ICD-10-CM

## 2016-01-17 DIAGNOSIS — B351 Tinea unguium: Secondary | ICD-10-CM | POA: Diagnosis not present

## 2016-01-17 NOTE — Progress Notes (Signed)
Patient ID: Toni Berger, female   DOB: 1946-07-28, 70 y.o.   MRN: [id number]  Subjective: Toni Berger is a 70 y.o. female patient seen today in office with complaint of painful callus and thickened and elongated toenails; unable to trim. Patient denies changes with medical history. Patient has no other pedal complaints at this time.   Patient Active Problem List   Diagnosis Date Noted  . Hyperlipidemia 01/09/2016  . Closed rib fracture 01/09/2016  . Remote lacunar infarction ([place]) 12/15/2015  . Loss of consciousness 12/15/2015  . Rotator cuff impingement syndrome 12/15/2015  . Fall in home 10/29/2015  . Cerebrovascular accident, old 10/29/2015  . Routine general medical examination at a health care facility 08/12/2011  . HYPERTENSION, BENIGN 11/12/2010  . OSTEOARTHRITIS 08/13/2010  . SEBACEOUS CYST 01/30/2010  . 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 ANXIETY 02/09/2008  . RESTLESS LEG SYNDROME 02/09/2008  . ALLERGIC RHINITIS 02/09/2008    Current Outpatient Prescriptions on File Prior to Visit  Medication Sig Dispense Refill  . amLODipine (NORVASC) 10 MG tablet Take 2.5-5 mg by mouth daily.    . clonazePAM (KLONOPIN) 1 MG tablet Take 1-2 tablets (1-2 mg total) by mouth at bedtime. 7 tablet 0  . clopidogrel (PLAVIX) 75 MG tablet Take by mouth.    Marland Kitchen ibuprofen (ADVIL) 200 MG tablet Take 200 mg by mouth every 6 (six) hours as needed.      Marland Kitchen lisinopril (PRINIVIL,ZESTRIL) 40 MG tablet Take 1 tablet (40 mg total) by mouth daily. 90 tablet 3  . pravastatin (PRAVACHOL) 40 MG tablet Take 20 mg by mouth daily.     No current facility-administered medications on file prior to visit.    Allergies  Allergen Reactions  . Carbidopa-Levodopa     REACTION: headache, stomach pain, chest pain and seeing purple colors  . Ciprofloxacin   . Metoprolol     To low heart rate/ BP  . Povidone-Iodine   . Propoxyphene Hcl   . Sulfonamide Derivatives     "stop breathing, eyes roll in back of head  and fingers web"  . Tetracycline     Objective: Physical Exam  General: Well developed, nourished, no acute distress, awake, alert and oriented x 3  Vascular: Dorsalis pedis artery 2/4 bilateral, Posterior tibial artery 2/4 bilateral, skin temperature warm to warm proximal to distal bilateral lower extremities, no varicosities, pedal hair present bilateral.  Neurological: Gross sensation present via light touch bilateral.   Dermatological: Skin is warm, dry, and supple bilateral, Nails 1-10 are tender, long, thick, and discolored with mild subungal debris, no webspace macerations present bilateral, no open lesions present bilateral, + hyperkeratotic tissue with nucleated core present sub met 1 and 2 on left. No signs of infection bilateral.  Musculoskeletal: Bunion and hammertoe deformities noted bilateral. Muscular strength within normal limits without pain on range of motion. No pain with calf compression bilateral.  Assessment and Plan:  Problem List Items Addressed This Visit    None    Visit Diagnoses    Dermatophytosis of nail    -  Primary    Porokeratosis        Pain in both feet          -Examined patient.  -Discussed treatment options for painful keratotic lesions and mycotic nails. -Mechanically debrided keratotic lesions x 2 using chisel blade and reduced mycotic nails with sterile nail nipper and dremel nail file without incident. -Recommend good supportive shoes daily for foot type. -Patient to return in 3  months/as needed for follow up evaluation or sooner if symptoms worsen.  Landis Martins, DPM

## 2016-02-12 DIAGNOSIS — H2513 Age-related nuclear cataract, bilateral: Secondary | ICD-10-CM | POA: Diagnosis not present

## 2016-02-17 ENCOUNTER — Telehealth: Payer: Self-pay | Admitting: *Deleted

## 2016-02-17 NOTE — Telephone Encounter (Signed)
Is this something that he will sign without a ending date?

## 2016-02-17 NOTE — Telephone Encounter (Signed)
This will be something that he will have to do when he gets back in town. Can you please let the patient know this.

## 2016-02-17 NOTE — Telephone Encounter (Signed)
Patient has questioned  if  Dr Caryl Bis could write her a  "do not resuscitate" with out a ending date . Please contact patient with any questions [phone]

## 2016-02-17 NOTE — Telephone Encounter (Signed)
Pt was informed that Dr.Sonneberg was out of the office and would address when he go back.

## 2016-02-26 ENCOUNTER — Telehealth: Payer: Self-pay | Admitting: Surgical

## 2016-02-26 MED ORDER — CLONAZEPAM 1 MG PO TABS: 1.0000 mg | ORAL_TABLET | Freq: Every day | ORAL | Status: DC

## 2016-02-26 NOTE — Telephone Encounter (Signed)
Please send Klonopin to [facility].

## 2016-02-26 NOTE — Telephone Encounter (Signed)
Faxed RX to pharmacy.  

## 2016-02-26 NOTE — Telephone Encounter (Signed)
Pt called. She wants  this medication sent to [place], [place]. [phone].

## 2016-02-26 NOTE — Telephone Encounter (Signed)
Printed. Please faxed to pharmacy.

## 2016-02-26 NOTE — Telephone Encounter (Signed)
Spoke with patient and she is wanting Dr. Caryl Bis to fill out the Do Not Resuscitate order. I have put this on your desk to fill out. She is needing a refill on her Klonopin 1 Mg. She takes one a night.

## 2016-03-18 ENCOUNTER — Telehealth: Payer: Self-pay | Admitting: *Deleted

## 2016-03-18 NOTE — Telephone Encounter (Signed)
Noted. Agree patient needs evaluation in the office.

## 2016-03-18 NOTE — Telephone Encounter (Signed)
Scheduled patient for Monday to come in to talk with doctor about change of medications. Patient stated that she is under so much stress that she is going to start taking half dose of the Klonopin during the day. I advised patient that is this medication causes any drowsiness that she should not be driving. Patient verbalized understanding.

## 2016-03-18 NOTE — Telephone Encounter (Signed)
Jamie Please advise, thanks

## 2016-03-18 NOTE — Telephone Encounter (Signed)
Patient stated that she has high stress levels, due to her husband dying. She requested to have a nurse to call to discuss medications, and possible changes.  She would like a DNR copy to keep in her purse because she's traveling back and forth to [facility] to see her husband.  Pt contact [phone]

## 2016-03-23 ENCOUNTER — Ambulatory Visit (INDEPENDENT_AMBULATORY_CARE_PROVIDER_SITE_OTHER): Payer: Medicare Other | Admitting: Family Medicine

## 2016-03-23 ENCOUNTER — Encounter: Payer: Self-pay | Admitting: Family Medicine

## 2016-03-23 VITALS — BP 130/76 | HR 73 | Temp 98.5°F | Ht 67.0 in | Wt 176.6 lb

## 2016-03-23 DIAGNOSIS — I872 Venous insufficiency (chronic) (peripheral): Secondary | ICD-10-CM | POA: Insufficient documentation

## 2016-03-23 DIAGNOSIS — I639 Cerebral infarction, unspecified: Secondary | ICD-10-CM

## 2016-03-23 DIAGNOSIS — E785 Hyperlipidemia, unspecified: Secondary | ICD-10-CM

## 2016-03-23 DIAGNOSIS — I10 Essential (primary) hypertension: Secondary | ICD-10-CM | POA: Diagnosis not present

## 2016-03-23 DIAGNOSIS — R609 Edema, unspecified: Secondary | ICD-10-CM | POA: Diagnosis not present

## 2016-03-23 DIAGNOSIS — F419 Anxiety disorder, unspecified: Secondary | ICD-10-CM | POA: Diagnosis not present

## 2016-03-23 DIAGNOSIS — M25473 Effusion, unspecified ankle: Secondary | ICD-10-CM | POA: Insufficient documentation

## 2016-03-23 NOTE — Patient Instructions (Addendum)
Nice to see you. We will check some lab work today and we'll call you with the results.  Please continue to watch your anxiety. You can take Klonopin for anxiety daily. If you develop chest pain, shortness of breath, worsening anxiety, thoughts of harming herself, or any new or changing symptoms please seek medical attention.

## 2016-03-23 NOTE — Progress Notes (Signed)
Pre visit review using our clinic review tool, if applicable. No additional management support is needed unless otherwise documented below in the visit note. 

## 2016-03-23 NOTE — Assessment & Plan Note (Signed)
Suspect venous insufficiency. Could also be related to dependent status of her feet as it occurs while she is on her feet. No CHF symptoms. We'll check CMP to rule out kidney and liver dysfunction. Not anemic on most recent check. Would consider additional workup her labs if worsens.

## 2016-03-23 NOTE — Assessment & Plan Note (Signed)
Patient with a fair amount of anxiety recently surrounding her husband's cancer diagnosis and admission to hospice. Sounds as though he is in the process of dying and her anxiety is understandable. Minimal depression with this. No SI. She just unsure what she is going to do with herself moving forward once he dies. She will continue Klonopin. She'll continue to follow with hospice counselors and chaplains. She is given return precautions.

## 2016-03-23 NOTE — Progress Notes (Signed)
Patient ID: Toni Berger, female   DOB: Jan 22, 1946, 70 y.o.   MRN: [id number]  Toni Rumps, MD Phone: [phone]  Toni Berger is a 70 y.o. female who presents today for follow-up.  HYPERTENSION Disease Monitoring Home BP Monitoring has been checking, typically in the 140s though has been up as high as 830N systolically when she is particularly anxious Chest pain- no    Dyspnea- no Medications Compliance-  taking amlodipine and lisinopril.   Edema- minimal if on her feet for too long, no orthopnea or PND. She denies any symptoms when her blood pressure was up in the 190s other than that she just did not feel right. Note she was particularly anxious surrounding her husband's situation.  Anxiety: Notes this has been worse at times recently. Her husband is in hospice and in the process of dying cancer. She notes occasionally her anxiety will be significantly worse. She has started the process of getting rid of his things already. She is meeting with hospice counselors and chaplains. She is taking Klonopin typically once a day though on 1-2 occasions is taken twice a day and this has been beneficial. Only takes when she is at home. Does not drive while taking this.   PMH: nonsmoker.   ROS see history of present illness  Objective  Physical Exam Filed Vitals:   03/23/16 1444  BP: 130/76  Pulse: 73  Temp: 98.5 F (36.9 C)    BP Readings from Last 3 Encounters:  03/23/16 130/76  01/09/16 122/66  12/09/15 110/68   Wt Readings from Last 3 Encounters:  03/23/16 176 lb 9.6 oz (80.105 kg)  01/09/16 176 lb 6.4 oz (80.015 kg)  12/09/15 178 lb (80.74 kg)    Physical Exam  Constitutional: She is well-developed, well-nourished, and in no distress.  HENT:  Head: Normocephalic and atraumatic.  Cardiovascular: Normal rate, regular rhythm and normal heart sounds.   Trace ankle edema, no other lower extremity edema  Pulmonary/Chest: Effort normal and breath sounds normal.    Neurological: She is alert. Gait normal.  Psychiatric:  Mood anxious, affect anxious and intermittently tearful     Assessment/Plan: Please see individual problem list.  Anxiety Patient with a fair amount of anxiety recently surrounding her husband's cancer diagnosis and admission to hospice. Sounds as though he is in the process of dying and her anxiety is understandable. Minimal depression with this. No SI. She just unsure what she is going to do with herself moving forward once he dies. She will continue Klonopin. She'll continue to follow with hospice counselors and chaplains. She is given return precautions.  HYPERTENSION, BENIGN Blood pressure slightly elevated at times at home. At goal today. Suspect some elevation related to her current mental state. She'll continue to monitor and if greater than 140/90 persistently she will let us know consider additional medication. We'll check a CMP today and LDL.  Ankle edema Suspect venous insufficiency. Could also be related to dependent status of her feet as it occurs while she is on her feet. No CHF symptoms. We'll check CMP to rule out kidney and liver dysfunction. Not anemic on most recent check. Would consider additional workup her labs if worsens.    Orders Placed This Encounter  Procedures  . Comp Met (CMET)  . Direct LDL    Toni Rumps, MD [place]

## 2016-03-23 NOTE — Assessment & Plan Note (Signed)
Blood pressure slightly elevated at times at home. At goal today. Suspect some elevation related to her current mental state. She'll continue to monitor and if greater than 140/90 persistently she will let us know consider additional medication. We'll check a CMP today and LDL.

## 2016-03-24 LAB — COMPREHENSIVE METABOLIC PANEL
ALK PHOS: 99 U/L (ref 39–117)
ALT: 12 U/L (ref 0–35)
AST: 16 U/L (ref 0–37)
Albumin: 4.1 g/dL (ref 3.5–5.2)
BILIRUBIN TOTAL: 0.4 mg/dL (ref 0.2–1.2)
BUN: 15 mg/dL (ref 6–23)
CALCIUM: 9.5 mg/dL (ref 8.4–10.5)
CO2: 28 mEq/L (ref 19–32)
Chloride: 105 mEq/L (ref 96–112)
Creatinine, Ser: 0.92 mg/dL (ref 0.40–1.20)
GFR: 64.12 mL/min (ref >60.00)
GLUCOSE: 102 mg/dL — AB (ref 70–99)
POTASSIUM: 4.6 meq/L (ref 3.5–5.1)
Sodium: 140 mEq/L (ref 135–145)
TOTAL PROTEIN: 7.1 g/dL (ref 6.0–8.3)

## 2016-03-24 LAB — LDL CHOLESTEROL, DIRECT: LDL DIRECT: 98 mg/dL

## 2016-04-03 ENCOUNTER — Other Ambulatory Visit: Payer: Self-pay | Admitting: *Deleted

## 2016-04-03 MED ORDER — CLONAZEPAM 1 MG PO TABS: 1.0000 mg | ORAL_TABLET | Freq: Every day | ORAL | Status: DC

## 2016-04-03 NOTE — Telephone Encounter (Signed)
Patient has requested a medication refill for [place]

## 2016-04-03 NOTE — Telephone Encounter (Signed)
Refill given

## 2016-04-03 NOTE — Telephone Encounter (Signed)
Spoke to patient. Explained Dr. Caryl Bis not in the office today. Patient has 7 tabs left. Advised would fwd to Dr. Caryl Bis. Patient agreed.

## 2016-04-10 ENCOUNTER — Ambulatory Visit: Payer: Medicare Other | Admitting: Family Medicine

## 2016-04-24 ENCOUNTER — Ambulatory Visit (INDEPENDENT_AMBULATORY_CARE_PROVIDER_SITE_OTHER): Payer: Medicare Other | Admitting: Sports Medicine

## 2016-04-24 ENCOUNTER — Encounter: Payer: Self-pay | Admitting: Sports Medicine

## 2016-04-24 DIAGNOSIS — Q828 Other specified congenital malformations of skin: Secondary | ICD-10-CM | POA: Diagnosis not present

## 2016-04-24 DIAGNOSIS — M79676 Pain in unspecified toe(s): Secondary | ICD-10-CM | POA: Diagnosis not present

## 2016-04-24 DIAGNOSIS — M79671 Pain in right foot: Secondary | ICD-10-CM

## 2016-04-24 DIAGNOSIS — B351 Tinea unguium: Secondary | ICD-10-CM | POA: Diagnosis not present

## 2016-04-24 DIAGNOSIS — M79672 Pain in left foot: Secondary | ICD-10-CM

## 2016-04-24 NOTE — Progress Notes (Signed)
Patient ID: Toni Berger, female   DOB: June 05, 1946, 70 y.o.   MRN: [id number]  Subjective: Toni Berger is a 70 y.o. female patient seen today in office with complaint of painful callus and thickened and elongated toenails; unable to trim. Patient denies changes with medical history. Patient has no other pedal complaints at this time.   Admits her husband passed away last month.  Patient Active Problem List   Diagnosis Date Noted  . Ankle edema 03/23/2016  . Hyperlipidemia 01/09/2016  . Closed rib fracture 01/09/2016  . 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 Loss of consciousness 12/15/2015  . Rotator cuff impingement syndrome 12/15/2015  . Fall in home 10/29/2015  . Cerebrovascular accident, old 10/29/2015  . HYPERTENSION, BENIGN 11/12/2010  . OSTEOARTHRITIS 08/13/2010  . 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 Anxiety 02/09/2008  . RESTLESS LEG SYNDROME 02/09/2008  . ALLERGIC RHINITIS 02/09/2008    Current Outpatient Prescriptions on File Prior to Visit  Medication Sig Dispense Refill  . amLODipine (NORVASC) 10 MG tablet Take 2.5-5 mg by mouth daily.    . clonazePAM (KLONOPIN) 1 MG tablet Take 1 tablet (1 mg total) by mouth at bedtime. 30 tablet 0  . clopidogrel (PLAVIX) 75 MG tablet Take by mouth.    Marland Kitchen ibuprofen (ADVIL) 200 MG tablet Take 200 mg by mouth every 6 (six) hours as needed.      Marland Kitchen lisinopril (PRINIVIL,ZESTRIL) 40 MG tablet Take 1 tablet (40 mg total) by mouth daily. 90 tablet 3  . pravastatin (PRAVACHOL) 40 MG tablet Take 20 mg by mouth daily.     No current facility-administered medications on file prior to visit.     Allergies  Allergen Reactions  . Carbidopa-Levodopa     REACTION: headache, stomach pain, chest pain and seeing purple colors  . Ciprofloxacin   . Metoprolol     To low heart rate/ BP  . Povidone-Iodine   . Propoxyphene Hcl   . Sulfonamide Derivatives     "stop breathing, eyes roll in back of head and fingers web"  . Tetracycline      Objective: Physical Exam  General: Well developed, nourished, no acute distress, awake, alert and oriented x 3  Vascular: Dorsalis pedis artery 2/4 bilateral, Posterior tibial artery 2/4 bilateral, skin temperature warm to warm proximal to distal bilateral lower extremities, no varicosities, pedal hair present bilateral.  Neurological: Gross sensation present via light touch bilateral.   Dermatological: Skin is warm, dry, and supple bilateral, Nails 1-10 are tender, long, thick, and discolored with mild subungal debris, no webspace macerations present bilateral, no open lesions present bilateral, + hyperkeratotic tissue with nucleated core present sub met 1 and 2 on left. No signs of infection bilateral.  Musculoskeletal: Bunion and hammertoe deformities noted bilateral. Muscular strength within normal limits without pain on range of motion. No pain with calf compression bilateral.  Assessment and Plan:  Problem List Items Addressed This Visit    None    Visit Diagnoses    Dermatophytosis of nail    -  Primary   Porokeratosis       Pain in both feet         -Examined patient.  -Discussed treatment options for painful keratotic lesions and mycotic nails. -Mechanically debrided keratotic lesions x 2 using chisel blade and reduced mycotic nails with sterile nail nipper and dremel nail file without incident. -Recommend good supportive shoes daily for foot type. -Patient to return in 3 months/as needed for follow up evaluation  or sooner if symptoms worsen.  Landis Martins, DPM

## 2016-04-28 ENCOUNTER — Other Ambulatory Visit: Payer: Self-pay | Admitting: Family Medicine

## 2016-04-29 ENCOUNTER — Ambulatory Visit (INDEPENDENT_AMBULATORY_CARE_PROVIDER_SITE_OTHER): Payer: Medicare Other | Admitting: Family Medicine

## 2016-04-29 ENCOUNTER — Encounter: Payer: Self-pay | Admitting: Family Medicine

## 2016-04-29 DIAGNOSIS — F419 Anxiety disorder, unspecified: Secondary | ICD-10-CM | POA: Insufficient documentation

## 2016-04-29 DIAGNOSIS — I10 Essential (primary) hypertension: Secondary | ICD-10-CM

## 2016-04-29 DIAGNOSIS — F4321 Adjustment disorder with depressed mood: Secondary | ICD-10-CM | POA: Diagnosis not present

## 2016-04-29 DIAGNOSIS — F329 Major depressive disorder, single episode, unspecified: Secondary | ICD-10-CM | POA: Insufficient documentation

## 2016-04-29 DIAGNOSIS — I639 Cerebral infarction, unspecified: Secondary | ICD-10-CM | POA: Diagnosis not present

## 2016-04-29 MED ORDER — CLONAZEPAM 1 MG PO TABS: 1.0000 mg | ORAL_TABLET | Freq: Two times a day (BID) | ORAL | 0 refills | Status: DC | PRN

## 2016-04-29 NOTE — Patient Instructions (Signed)
Nice to see you. Please continue to monitor your blood pressure periodically. We'll refill your Klonopin for anxiety. I recommend following up with hospice counselor and group counseling. If you develop worsening anxiety or depression or thoughts of harming herself please seek medical attention.

## 2016-04-29 NOTE — Progress Notes (Signed)
  [name], MD Phone: [phone]  Toni Berger is a 70 y.o. female who presents today for f/u.  HYPERTENSION  Disease Monitoring  Home BP Monitoring checking 122-142/77-91, typically less than 140/90 Chest pain- no    Dyspnea- no Medications  Compliance-  taking lisinopril and amlodipine.  Edema- no  Grief: patients husband passed away since our last visit. She has been dealing with this. Felt like she was doing better until the last 3 days when she had a worsening of her grief. She just went off the wall several days ago thinking about her husband and her being lonely. See's a hospice grief counselor later this week and does continue group counseling as well. Mostly feels lonely. Some anxiety. No depression. Has been using her Klonopin 1-2 times a day. Cooking helps her stay busy and keeps her mind off of her husband's death.  PMH: nonsmoker.   ROS see history of present illness  Objective  Physical Exam Vitals:   04/29/16 1047  BP: 136/84  Pulse: 67  Temp: 97.9 F (36.6 C)    BP Readings from Last 3 Encounters:  04/29/16 136/84  03/23/16 130/76  01/09/16 122/66   Wt Readings from Last 3 Encounters:  04/29/16 174 lb 12.8 oz (79.3 kg)  03/23/16 176 lb 9.6 oz (80.1 kg)  01/09/16 176 lb 6.4 oz (80 kg)    Physical Exam  Constitutional: No distress.  HENT:  Head: Normocephalic and atraumatic.  Cardiovascular: Normal rate, regular rhythm and normal heart sounds.   Pulmonary/Chest: Effort normal and breath sounds normal.  Neurological: She is alert. Gait normal.  Skin: Skin is warm and dry. She is not diaphoretic.  Psychiatric:  Mood lonely and grief stricken, affect intermittent tearful and sad     Assessment/Plan: Please see individual problem list.  HYPERTENSION, BENIGN At goal at home. Continue current medications.  Grief Patient with sadness and grief related to her husband's death. No depression or SI. Some anxiety for which she takes Klonopin  intermittently. Discussed seeing a hospice counselor and doing group counseling as well to provide her an additional support system. Discussed that it will take time for her to have improvement in her symptoms and that what she is feeling is a normal reaction. She will proceed with counseling. Klonopin was refilled. She is given return precautions.   No orders of the defined types were placed in this encounter.   Meds ordered this encounter  Medications  . clonazePAM (KLONOPIN) 1 MG tablet    Sig: Take 1 tablet (1 mg total) by mouth 2 (two) times daily as needed for anxiety.    Dispense:  60 tablet    Refill:  0    [name], MD [place]

## 2016-04-29 NOTE — Assessment & Plan Note (Signed)
Patient with sadness and grief related to her husband's death. No depression or SI. Some anxiety for which she takes Klonopin intermittently. Discussed seeing a hospice counselor and doing group counseling as well to provide her an additional support system. Discussed that it will take time for her to have improvement in her symptoms and that what she is feeling is a normal reaction. She will proceed with counseling. Klonopin was refilled. She is given return precautions.

## 2016-04-29 NOTE — Progress Notes (Signed)
Pre visit review using our clinic review tool, if applicable. No additional management support is needed unless otherwise documented below in the visit note. 

## 2016-04-29 NOTE — Telephone Encounter (Signed)
Patient has appointment today

## 2016-04-29 NOTE — Assessment & Plan Note (Signed)
At goal at home. Continue current medications. 

## 2016-06-02 ENCOUNTER — Other Ambulatory Visit: Payer: Self-pay | Admitting: Family Medicine

## 2016-06-03 NOTE — Telephone Encounter (Signed)
Last refilled 04/29/16.  Pt last seen 04/29/16.   Please advise?

## 2016-06-05 ENCOUNTER — Ambulatory Visit (INDEPENDENT_AMBULATORY_CARE_PROVIDER_SITE_OTHER): Payer: Medicare Other | Admitting: Family Medicine

## 2016-06-05 ENCOUNTER — Ambulatory Visit: Payer: Medicare Other | Admitting: Family Medicine

## 2016-06-05 ENCOUNTER — Encounter: Payer: Self-pay | Admitting: Family Medicine

## 2016-06-05 DIAGNOSIS — F4321 Adjustment disorder with depressed mood: Secondary | ICD-10-CM

## 2016-06-05 DIAGNOSIS — I639 Cerebral infarction, unspecified: Secondary | ICD-10-CM | POA: Diagnosis not present

## 2016-06-05 DIAGNOSIS — M75101 Unspecified rotator cuff tear or rupture of right shoulder, not specified as traumatic: Secondary | ICD-10-CM | POA: Diagnosis not present

## 2016-06-05 DIAGNOSIS — M7541 Impingement syndrome of right shoulder: Secondary | ICD-10-CM

## 2016-06-05 DIAGNOSIS — R52 Pain, unspecified: Secondary | ICD-10-CM | POA: Diagnosis not present

## 2016-06-05 NOTE — Assessment & Plan Note (Signed)
Continues to deal with this regarding the death of her husband. She'll continue hospice and Klonopin as needed.

## 2016-06-05 NOTE — Assessment & Plan Note (Signed)
Patient's symptoms in her right arm could be related to rotator cuff tendinitis or impingement or strain of the biceps or triceps. I advised again that this is not likely related to her prior stroke. At this time she is not going to proceed with any treatment. Advised physical therapy in the future may be beneficial.

## 2016-06-05 NOTE — Assessment & Plan Note (Signed)
Patient notes burning pain in her anterior thighs. No obvious defects. Suspect likely related to her increased activity level recently. Advised that she decrease her activity level somewhat and see if this improves. If not improving, consider nerve impingement and consider neurology referral versus imaging of her back versus gabapentin.

## 2016-06-05 NOTE — Patient Instructions (Signed)
Nice to see you. Your symptoms are likely related to increased muscle use. Please back off on your activities slightly and see if this improves. Please continue to seek grief counseling and take the Klonopin as needed.

## 2016-06-05 NOTE — Progress Notes (Signed)
  [name], MD Phone: [phone]  Toni Berger is a 70 y.o. female who presents today for follow-up.  Patient notes for the last several weeks her anterior thighs have been burning at times. Improves when she changes in position. Can happen when she is active or sitting. She has been more physically active recently cleaning out her house after her husband's recent death. Notes no issues with this today. No numbness or weakness. Does note she occasionally feels off balance though has had no falls, lightheadedness, or vertigo.  She additionally notes discomfort that has been chronic in nature in her mid bicep and tricep on the right side. She attributes this to the stroke she previously had. She notes it hurts that she has no weakness or numbness. She has to use her left arm to reach up above her head.  Grief: Notes this is stable. She is going to group and one-on-one counseling through hospice. Taking Klonopin 1-2 times a day.  PMH: nonsmoker.   ROS see history of present illness  Objective  Physical Exam Vitals:   06/05/16 1359  BP: 120/70  Pulse: 64  Temp: 98.2 F (36.8 C)    BP Readings from Last 3 Encounters:  06/05/16 120/70  04/29/16 136/84  03/23/16 130/76   Wt Readings from Last 3 Encounters:  06/05/16 179 lb 9.6 oz (81.5 kg)  04/29/16 174 lb 12.8 oz (79.3 kg)  03/23/16 176 lb 9.6 oz (80.1 kg)    Physical Exam  Constitutional: No distress.  HENT:  Head: Normocephalic and atraumatic.  Cardiovascular: Normal rate, regular rhythm and normal heart sounds.   Pulmonary/Chest: Effort normal and breath sounds normal.  Musculoskeletal: She exhibits no edema.  No tenderness of bilateral thighs, no skin changes of bilateral thighs, there is a small palpable lipoma in the left upper anterior thigh, mild tenderness over the right bicep and tricep with no overlying skin changes, full range of motion right shoulder  Neurological: She is alert.  CN 2-12 intact, 5/5  strength in bilateral biceps, triceps, grip, quads, hamstrings, plantar and dorsiflexion, sensation to light touch intact in bilateral UE and LE, normal gait, 2+ patellar reflexes  Skin: Skin is warm and dry. She is not diaphoretic.     Assessment/Plan: Please see individual problem list.  Rotator cuff impingement syndrome Patient's symptoms in her right arm could be related to rotator cuff tendinitis or impingement or strain of the biceps or triceps. I advised again that this is not likely related to her prior stroke. At this time she is not going to proceed with any treatment. Advised physical therapy in the future may be beneficial.  Grief Continues to deal with this regarding the death of her husband. She'll continue hospice and Klonopin as needed.  Burning pain Patient notes burning pain in her anterior thighs. No obvious defects. Suspect likely related to her increased activity level recently. Advised that she decrease her activity level somewhat and see if this improves. If not improving, consider nerve impingement and consider neurology referral versus imaging of her back versus gabapentin.   [name], MD [place]

## 2016-06-05 NOTE — Progress Notes (Signed)
Pre visit review using our clinic review tool, if applicable. No additional management support is needed unless otherwise documented below in the visit note. 

## 2016-06-09 ENCOUNTER — Telehealth: Payer: Self-pay | Admitting: Family Medicine

## 2016-06-09 NOTE — Telephone Encounter (Signed)
Pt called asking if you feel that it is ok for her to sign up for Silver sneakers or should she wait a while before signing up. Thank you!  Call pt [phone] (v/m will say Jan) may leave message.

## 2016-06-09 NOTE — Telephone Encounter (Signed)
Pt advised to hold off for another month and she stated she would talk to you at appt in November

## 2016-06-09 NOTE — Telephone Encounter (Signed)
jamie stated that you told pt tp slow down. Is this something you'd like for her to start?

## 2016-06-09 NOTE — Telephone Encounter (Signed)
I would advise her to hold off on this for the next several weeks to a month. She can then start it as long she is feeling improved.

## 2016-07-01 ENCOUNTER — Telehealth: Payer: Self-pay | Admitting: Family Medicine

## 2016-07-01 DIAGNOSIS — Z634 Disappearance and death of family member: Secondary | ICD-10-CM

## 2016-07-01 NOTE — Telephone Encounter (Signed)
She can be seen by psychology in our office or she can contact one of the therapist on the list that I provided to [place].

## 2016-07-01 NOTE — Telephone Encounter (Signed)
Please advise 

## 2016-07-01 NOTE — Telephone Encounter (Signed)
Pt wants to know if Dr. Caryl Bis knows anyone in the [place] area that does counseling. Please advise, thank you!  Call pt @ [phone]

## 2016-07-01 NOTE — Telephone Encounter (Signed)
Spoke with patient and she would rather have referral to psychology. She would like to make sure this is covered by her insurance. Please place oreder.

## 2016-07-02 NOTE — Telephone Encounter (Signed)
Referral placed. I'll forward to Melissa to see if she can check and make sure the patient's insurance will cover this.

## 2016-07-03 ENCOUNTER — Other Ambulatory Visit: Payer: Self-pay | Admitting: Family Medicine

## 2016-07-03 NOTE — Telephone Encounter (Signed)
Can we refill this? 

## 2016-07-16 ENCOUNTER — Ambulatory Visit (INDEPENDENT_AMBULATORY_CARE_PROVIDER_SITE_OTHER): Payer: Medicare Other | Admitting: Psychology

## 2016-07-16 DIAGNOSIS — F321 Major depressive disorder, single episode, moderate: Secondary | ICD-10-CM | POA: Diagnosis not present

## 2016-07-23 ENCOUNTER — Ambulatory Visit (INDEPENDENT_AMBULATORY_CARE_PROVIDER_SITE_OTHER): Payer: Medicare Other | Admitting: Psychology

## 2016-07-23 DIAGNOSIS — F321 Major depressive disorder, single episode, moderate: Secondary | ICD-10-CM

## 2016-07-28 ENCOUNTER — Ambulatory Visit: Payer: Medicare Other | Admitting: Podiatry

## 2016-07-29 ENCOUNTER — Ambulatory Visit: Payer: Medicare Other | Admitting: Podiatry

## 2016-07-30 ENCOUNTER — Encounter: Payer: Self-pay | Admitting: Family Medicine

## 2016-07-30 ENCOUNTER — Ambulatory Visit (INDEPENDENT_AMBULATORY_CARE_PROVIDER_SITE_OTHER): Payer: Medicare Other | Admitting: Family Medicine

## 2016-07-30 VITALS — BP 118/72 | HR 68 | Temp 97.8°F | Wt 180.4 lb

## 2016-07-30 DIAGNOSIS — I639 Cerebral infarction, unspecified: Secondary | ICD-10-CM

## 2016-07-30 DIAGNOSIS — I10 Essential (primary) hypertension: Secondary | ICD-10-CM | POA: Diagnosis not present

## 2016-07-30 DIAGNOSIS — F4320 Adjustment disorder, unspecified: Secondary | ICD-10-CM | POA: Diagnosis not present

## 2016-07-30 DIAGNOSIS — F4321 Adjustment disorder with depressed mood: Secondary | ICD-10-CM

## 2016-07-30 DIAGNOSIS — R0989 Other specified symptoms and signs involving the circulatory and respiratory systems: Secondary | ICD-10-CM | POA: Diagnosis not present

## 2016-07-30 MED ORDER — CLONAZEPAM 1 MG PO TABS: ORAL_TABLET | ORAL | 2 refills | Status: DC

## 2016-07-30 NOTE — Progress Notes (Signed)
Pre visit review using our clinic review tool, if applicable. No additional management support is needed unless otherwise documented below in the visit note. 

## 2016-07-30 NOTE — Assessment & Plan Note (Signed)
At goal. Continue current medication. 

## 2016-07-30 NOTE — Assessment & Plan Note (Signed)
Continues to have issues with this. Mostly taking Klonopin as prescribed. Saw the counselor though wants to see somebody new for this. We will refer for her to a grief counselor. We will provide her with a refill of her Klonopin. She was advised not to take more than prescribed. I advised that she start on an SSRI though she refused.

## 2016-07-30 NOTE — Patient Instructions (Signed)
Nice to see you. I have refilled your Klonopin. We'll get you to see a grief counselor. Please continue your blood pressure medications. We will get you to have ABIs to evaluate your legs. Please monitor at the slight wheeze that you get if this is persistent or gets worse please let us know.

## 2016-07-30 NOTE — Progress Notes (Signed)
[name], MD Phone: [phone]  Toni Berger is a 70 y.o. female who presents today for follow-up.  Anxiety/depression/grief: Notes this is okay. She notes some depression and anxiety. Takes Klonopin twice daily. On 2 occasions has taken one extra dose. She saw a counselor here and wants to see somebody else due to feeling shuffled around and wanting to see somebody more frequently than she can see her. Notes no SI or HI. She refuses SSRI.  Patient continues to note a slight burning discomfort in her bilateral anterior thighs. Notes this occurs if she stands or walks for a long period of time. Resolves if she sits down. No numbness or weakness. No back pain.  Notes a slight wheeze at night when she lays down at times. No coughing. No shortness of breath. No history of asthma. No symptoms at this time.  HYPERTENSION  Disease Monitoring  Home BP Monitoring typically [id number] systolic Chest pain- no    Dyspnea- no Medications  Compliance-  taking amlodipine, lisinopril.  Edema- no   PMH: nonsmoker.   ROS see history of present illness  Objective  Physical Exam Vitals:   07/30/16 1049  BP: 118/72  Pulse: 68  Temp: 97.8 F (36.6 C)    BP Readings from Last 3 Encounters:  07/30/16 118/72  06/05/16 120/70  04/29/16 136/84   Wt Readings from Last 3 Encounters:  07/30/16 180 lb 6.4 oz (81.8 kg)  06/05/16 179 lb 9.6 oz (81.5 kg)  04/29/16 174 lb 12.8 oz (79.3 kg)    Physical Exam  Constitutional: No distress.  Cardiovascular: Normal rate, regular rhythm and normal heart sounds.   2+ DP pulses bilaterally, decreased PT pulses bilaterally, feet are warm  Pulmonary/Chest: Effort normal and breath sounds normal.  Musculoskeletal:  Bilateral thighs with no tenderness  Neurological: She is alert. Gait normal.  5 out of 5 strength bilateral quads, hamstrings, plantar flexion, and dorsiflexion, sensation to light touch intact bilateral lower extremities  Skin: Skin is  warm and dry. She is not diaphoretic.  Psychiatric:  Mood depressed, affect depressed and intermittently tearful     Assessment/Plan: Please see individual problem list.  HYPERTENSION, BENIGN At goal. Continue current medication.  Grief Continues to have issues with this. Mostly taking Klonopin as prescribed. Saw the counselor though wants to see somebody new for this. We will refer for her to a grief counselor. We will provide her with a refill of her Klonopin. She was advised not to take more than prescribed. I advised that she start on an SSRI though she refused.  Decreased pedal pulses Decreased PT pulses bilaterally. I wonder if some of her symptoms in her thighs could be claudication related though the description is very odd for this. We will obtain ABIs to evaluate. Once these return will make a determination on next step in management.  Wheeze: Unsure if she is actually wheezing. Only occurs at night on occasion and resolves with rolling over and going to sleep. No daytime symptoms. No cough. No shortness of breath. No history of asthma. Discussed continuing to monitor and if there is persistence or worsening she should let us know.  No orders of the defined types were placed in this encounter.   Meds ordered this encounter  Medications  . clonazePAM (KLONOPIN) 1 MG tablet    Sig: TAKE 1 TABLET BY MOUTH TWICE A DAY AS NEEDED ANXIETY    Dispense:  60 tablet    Refill:  2    Not to  exceed 5 additional fills before 12/01/2016    [name], MD [place]

## 2016-07-30 NOTE — Assessment & Plan Note (Signed)
Decreased PT pulses bilaterally. I wonder if some of her symptoms in her thighs could be claudication related though the description is very odd for this. We will obtain ABIs to evaluate. Once these return will make a determination on next step in management.

## 2016-07-31 ENCOUNTER — Ambulatory Visit (INDEPENDENT_AMBULATORY_CARE_PROVIDER_SITE_OTHER): Payer: Medicare Other | Admitting: Podiatry

## 2016-07-31 ENCOUNTER — Telehealth: Payer: Self-pay | Admitting: Family Medicine

## 2016-07-31 DIAGNOSIS — M79609 Pain in unspecified limb: Secondary | ICD-10-CM

## 2016-07-31 DIAGNOSIS — E0843 Diabetes mellitus due to underlying condition with diabetic autonomic (poly)neuropathy: Secondary | ICD-10-CM

## 2016-07-31 DIAGNOSIS — F4321 Adjustment disorder with depressed mood: Secondary | ICD-10-CM

## 2016-07-31 DIAGNOSIS — B351 Tinea unguium: Secondary | ICD-10-CM | POA: Diagnosis not present

## 2016-07-31 DIAGNOSIS — L608 Other nail disorders: Secondary | ICD-10-CM

## 2016-07-31 DIAGNOSIS — L603 Nail dystrophy: Secondary | ICD-10-CM

## 2016-07-31 NOTE — Telephone Encounter (Signed)
Pt called and wanted to know if you had put in a referral for another therapist. Please advise, thank you!  Call pt @ [phone]

## 2016-07-31 NOTE — Telephone Encounter (Signed)
Please advise 

## 2016-07-31 NOTE — Telephone Encounter (Signed)
Patient should not drink alcohol with the klonopin she is on. Thanks.

## 2016-07-31 NOTE — Telephone Encounter (Signed)
Pt wanted to asked if it was ok to drink some white wine. Would it bother her blood pressure? She only has about a 1/2 glass left in the bottle. Please advise, thank you!  Call pt @ [phone]

## 2016-07-31 NOTE — Telephone Encounter (Signed)
Notified patient that she should not drink alcohol with the medication.

## 2016-07-31 NOTE — Telephone Encounter (Signed)
Referral has been placed. 

## 2016-08-03 ENCOUNTER — Telehealth: Payer: Self-pay | Admitting: Family Medicine

## 2016-08-03 ENCOUNTER — Ambulatory Visit: Payer: Medicare Other | Admitting: Podiatry

## 2016-08-03 NOTE — Telephone Encounter (Signed)
Pt has called multiple times today about her referral for counseling. She said that Dr. Caryl Bis wanted her seen more than once a week but stated that she did not want anything to do with [facility]. She was asked by Lorriane Shire if she had called her insurance to get a list of providers that was in network with her insurance. She said no because that is why she came to a doctors office so we could do it. Lorriane Shire told her that she would call her insurance to get a list.  She called back 5 minutes later.  Pt called back stating that she felt that the person that she was talking to aggravated by the fact that she wasn't going to call the insurance to get a list of people and if it was to much trouble then she wasn't going to worry about it. I told her that we would call her insurance and that it may not be today that she hears back from me but it may be tomorrow. I told her this multiple times. She then asked about her other referral for ABI. I was trying to explain to her that [facility] was going to call her to schedule this appointment. She then started getting aggravated with me about this referral because I couldn't tell her exactly what they were going to do at this appointment. I told her it was an ultrasound but she said that Dr. Caryl Bis told her that they were going to put BP Cuffs on her and I couldn't tell her where they were going to put the cuffs on her.  I again told her that [facility] would call her to schedule and that I would call her once I got the list from her insurance.   She then called back and Lorriane Shire spoke to her about her referral for counseling. Lorriane Shire had called her insurance and they stated that they did not have a list of providers so she could see anyone that accepting Medicare. Lorriane Shire had talked to her about scheduling her appt at [facility]. Vernese agreed and said that she would call to schedule her appt with Bambi at [facility].  She then called back---see Amanda's message

## 2016-08-03 NOTE — Telephone Encounter (Signed)
Pt called and stated that she would like cancel all therapy visits. Pt stated that she called and left a message for Behavioral health to make appointment with Bambi but stated driving to [facility] was too much, and doesn't know if Dr. Caryl Bis wants her to be seen more than once a week. She stated that it is becoming very frustrating and insinuated that no one is trying to help her.

## 2016-08-04 ENCOUNTER — Telehealth: Payer: Self-pay | Admitting: Family Medicine

## 2016-08-04 ENCOUNTER — Ambulatory Visit (INDEPENDENT_AMBULATORY_CARE_PROVIDER_SITE_OTHER): Payer: Medicare Other

## 2016-08-04 DIAGNOSIS — R0989 Other specified symptoms and signs involving the circulatory and respiratory systems: Secondary | ICD-10-CM

## 2016-08-04 NOTE — Telephone Encounter (Signed)
Please advise 

## 2016-08-04 NOTE — Telephone Encounter (Signed)
Noted. Will take this in to consideration and discuss with her at follow-up.

## 2016-08-04 NOTE — Telephone Encounter (Signed)
Spoke with Estill Bamberg up front and she will call patient as she was upset over different staff calling her. thanks

## 2016-08-04 NOTE — Telephone Encounter (Signed)
Once a week is fine. She does not necessarily need to be seen more than that. Please see prior phone note from [facility]. It appears she called back and asked to cancel all therapy visits.

## 2016-08-04 NOTE — Telephone Encounter (Signed)
Pt called and stated that she is going to see Bambi on 12/7. Pt wants to know if she should be going more than once a week. Also wants to know if we found another provider?

## 2016-08-05 ENCOUNTER — Encounter (INDEPENDENT_AMBULATORY_CARE_PROVIDER_SITE_OTHER): Payer: Self-pay

## 2016-08-16 NOTE — Progress Notes (Signed)
SUBJECTIVE Patient with a history of diabetes mellitus presents to office today complaining of elongated, thickened nails. Pain while ambulating in shoes. Patient is unable to trim their own nails.   Allergies  Allergen Reactions  . Carbidopa-Levodopa     REACTION: headache, stomach pain, chest pain and seeing purple colors  . Ciprofloxacin   . Metoprolol     To low heart rate/ BP  . Povidone-Iodine   . Propoxyphene Hcl   . Sulfonamide Derivatives     "stop breathing, eyes roll in back of head and fingers web"  . Tetracycline     OBJECTIVE General Patient is awake, alert, and oriented x 3 and in no acute distress. Derm Skin is dry and supple bilateral. Negative open lesions or macerations. Remaining integument unremarkable. Nails are tender, long, thickened and dystrophic with subungual debris, consistent with onychomycosis, 1-5 bilateral. No signs of infection noted. Vasc  DP and PT pedal pulses palpable bilaterally. Temperature gradient within normal limits.  Neuro Epicritic and protective threshold sensation diminished bilaterally.  Musculoskeletal Exam No symptomatic pedal deformities noted bilateral. Muscular strength within normal limits.  ASSESSMENT 1. Diabetes Mellitus w/ peripheral neuropathy 2. Onychomycosis of nail due to dermatophyte bilateral 3. Pain in foot bilateral  PLAN OF CARE 1. Patient evaluated today. 2. Instructed to maintain good pedal hygiene and foot care. Stressed importance of controlling blood sugar.  3. Mechanical debridement of nails 1-5 bilaterally performed using a nail nipper. Filed with dremel without incident.  4. Return to clinic in 3 mos.    Edrick Kins, DPM

## 2016-08-20 ENCOUNTER — Ambulatory Visit (INDEPENDENT_AMBULATORY_CARE_PROVIDER_SITE_OTHER): Payer: Medicare Other | Admitting: Psychology

## 2016-08-20 DIAGNOSIS — F321 Major depressive disorder, single episode, moderate: Secondary | ICD-10-CM | POA: Diagnosis not present

## 2016-08-24 ENCOUNTER — Ambulatory Visit (INDEPENDENT_AMBULATORY_CARE_PROVIDER_SITE_OTHER): Payer: Medicare Other | Admitting: Psychology

## 2016-08-24 DIAGNOSIS — F321 Major depressive disorder, single episode, moderate: Secondary | ICD-10-CM | POA: Diagnosis not present

## 2016-09-01 ENCOUNTER — Other Ambulatory Visit: Payer: Self-pay | Admitting: *Deleted

## 2016-09-01 NOTE — Telephone Encounter (Signed)
Patient has requested a medication refill for pravastatin,lisinosil and amlodipine  Pharmacy [facility] [phone]

## 2016-09-01 NOTE — Telephone Encounter (Signed)
Per chart these have not been filled since 2012

## 2016-09-02 NOTE — Telephone Encounter (Signed)
Please contact the patient to confirm dosing on amlodipine, lisinopril, and pravastatin. Thanks.

## 2016-09-03 ENCOUNTER — Ambulatory Visit (INDEPENDENT_AMBULATORY_CARE_PROVIDER_SITE_OTHER): Payer: Medicare Other | Admitting: Psychology

## 2016-09-03 ENCOUNTER — Telehealth: Payer: Self-pay | Admitting: Family Medicine

## 2016-09-03 ENCOUNTER — Other Ambulatory Visit (INDEPENDENT_AMBULATORY_CARE_PROVIDER_SITE_OTHER): Payer: Medicare Other

## 2016-09-03 DIAGNOSIS — E785 Hyperlipidemia, unspecified: Secondary | ICD-10-CM

## 2016-09-03 DIAGNOSIS — F321 Major depressive disorder, single episode, moderate: Secondary | ICD-10-CM

## 2016-09-03 DIAGNOSIS — E784 Other hyperlipidemia: Secondary | ICD-10-CM

## 2016-09-03 DIAGNOSIS — E7849 Other hyperlipidemia: Secondary | ICD-10-CM

## 2016-09-03 LAB — LIPID PANEL
CHOL/HDL RATIO: 3
CHOLESTEROL: 189 mg/dL (ref 0–200)
HDL: 56.7 mg/dL (ref >39.00)
LDL Cholesterol: 106 mg/dL — ABNORMAL HIGH (ref 0–99)
NonHDL: 132.79
TRIGLYCERIDES: 132 mg/dL (ref 0.0–149.0)
VLDL: 26.4 mg/dL (ref 0.0–40.0)

## 2016-09-03 MED ORDER — LISINOPRIL 40 MG PO TABS: 40.0000 mg | ORAL_TABLET | Freq: Every day | ORAL | 3 refills | Status: DC

## 2016-09-03 MED ORDER — AMLODIPINE BESYLATE 5 MG PO TABS: 5.0000 mg | ORAL_TABLET | Freq: Every day | ORAL | 3 refills | Status: DC

## 2016-09-03 MED ORDER — PRAVASTATIN SODIUM 40 MG PO TABS: 40.0000 mg | ORAL_TABLET | Freq: Every day | ORAL | 3 refills | Status: DC

## 2016-09-03 NOTE — Telephone Encounter (Signed)
Verified dosages with patient and pharmacy, [facility] per patient request. Lisinopril 40mg  Pravastatin 40mg  amlodipine 5mg .

## 2016-09-03 NOTE — Telephone Encounter (Signed)
Pt called and stated that she needs amLODipine (NORVASC) 5 MG tablet, lisinopril (PRINIVIL,ZESTRIL) 40 MG tablet, and pravastatin (PRAVACHOL) 40 MG tablet.  Pharmacy - [place], [place] to [facility]

## 2016-09-03 NOTE — Telephone Encounter (Signed)
See other message

## 2016-09-03 NOTE — Telephone Encounter (Signed)
Sent to pharmacy 

## 2016-09-10 ENCOUNTER — Ambulatory Visit (INDEPENDENT_AMBULATORY_CARE_PROVIDER_SITE_OTHER): Payer: Medicare Other | Admitting: Psychology

## 2016-09-10 DIAGNOSIS — F321 Major depressive disorder, single episode, moderate: Secondary | ICD-10-CM | POA: Diagnosis not present

## 2016-09-17 ENCOUNTER — Ambulatory Visit: Payer: Medicare Other | Admitting: Psychology

## 2016-09-23 ENCOUNTER — Telehealth: Payer: Self-pay | Admitting: Family Medicine

## 2016-09-23 DIAGNOSIS — F419 Anxiety disorder, unspecified: Secondary | ICD-10-CM

## 2016-09-23 NOTE — Telephone Encounter (Signed)
Noted. Thanks.

## 2016-09-23 NOTE — Telephone Encounter (Signed)
Pt called back and wanted to know if Dr. Caryl Bis could recommend anyone else that take her insurance and is in the area. Also wanted to know if you know anything about Dr. Bary Leriche, and what your opinion is. Please advise, thank you!

## 2016-09-23 NOTE — Telephone Encounter (Signed)
Patient was seeing Toni Berger

## 2016-09-23 NOTE — Telephone Encounter (Signed)
Please advise 

## 2016-09-23 NOTE — Telephone Encounter (Signed)
I unfortunately do not know anything regarding this psychologist. I do have a list of psychologists in the area that we can provide to her if she would like. These are highly thought of by the other physicians in the practice. This could be left at the front for her to pick up.

## 2016-09-23 NOTE — Telephone Encounter (Signed)
She also wants Dr. Caryl Bis to know that she is not taking any medication other than the ones that he has prescribed. She is not taking any that the psychologist has suggested.

## 2016-09-23 NOTE — Telephone Encounter (Signed)
Pt called and wanted Dr. Caryl Bis to know that she is going to instead of her current provider. She states that she keeps moving and cannot get the same appt times.   She will be seeing Dr. Venda Rodes she is a psychologist [phone] [address] [place], [place], [address].  If any questions to call her at [phone]

## 2016-09-24 NOTE — Addendum Note (Signed)
Addended by: Caryl Bis, Analysse Quinonez G on: 09/24/2016 12:14 PM   Modules accepted: Orders

## 2016-09-24 NOTE — Telephone Encounter (Signed)
Referral placed.

## 2016-09-24 NOTE — Telephone Encounter (Signed)
Patient would like for [place] to do a referral, she would like to stay in the [place] area

## 2016-09-28 NOTE — Telephone Encounter (Signed)
Please advise 

## 2016-09-28 NOTE — Telephone Encounter (Signed)
Pt requested a update on her referral. She has previously requested a new counselor Pt contact [phone]

## 2016-09-28 NOTE — Telephone Encounter (Signed)
Spoke to pt and offered assurance with Cristin Saffo. Also gave her numbers of two other psychologist/therapist in the area that Dr. Caryl Bis has given me.

## 2016-09-29 NOTE — Telephone Encounter (Signed)
pt wanted to [facility] Dr. Caryl Bis that she was not pleased with the therapist referred.

## 2016-09-29 NOTE — Telephone Encounter (Signed)
fyi

## 2016-09-30 NOTE — Telephone Encounter (Signed)
Noted. Patient should contact the other psychologists whose numbers have been provided to her if she is unhappy with the current psychologist.

## 2016-10-02 NOTE — Telephone Encounter (Signed)
Noted. We will discuss at her appointment per her request in February. Typically I don't repeat films of the ribs unless she continues to have pain.

## 2016-10-02 NOTE — Telephone Encounter (Signed)
Patient states she broke her ribs last year and was wondering if she should have this xrayed again to follow up on healing process. Patient states she has called 12 psychologists and they informed her that they were either not accepting new patients or are not taking medicare. Patient states she does not want a return call she will simply just follow up with you in February. Patient sounded depressed.

## 2016-10-02 NOTE — Telephone Encounter (Signed)
Left message to return call 

## 2016-10-02 NOTE — Telephone Encounter (Signed)
Please call pt back at [phone]

## 2016-10-05 ENCOUNTER — Ambulatory Visit: Payer: Medicare Other | Admitting: Podiatry

## 2016-10-13 ENCOUNTER — Telehealth: Payer: Self-pay | Admitting: *Deleted

## 2016-10-13 NOTE — Telephone Encounter (Signed)
fyi

## 2016-10-13 NOTE — Telephone Encounter (Signed)
Patient wanted to [facility] Dr. Caryl Bis that she found a counselor named Emilio Aspen . This counselor specializes in grief .

## 2016-10-15 ENCOUNTER — Ambulatory Visit (INDEPENDENT_AMBULATORY_CARE_PROVIDER_SITE_OTHER): Payer: Medicare Other | Admitting: Podiatry

## 2016-10-15 ENCOUNTER — Encounter: Payer: Self-pay | Admitting: Podiatry

## 2016-10-15 ENCOUNTER — Telehealth: Payer: Self-pay | Admitting: Podiatry

## 2016-10-15 VITALS — BP 145/82 | HR 73 | Resp 18

## 2016-10-15 DIAGNOSIS — L03031 Cellulitis of right toe: Secondary | ICD-10-CM | POA: Diagnosis not present

## 2016-10-15 DIAGNOSIS — B351 Tinea unguium: Secondary | ICD-10-CM | POA: Diagnosis not present

## 2016-10-15 DIAGNOSIS — M79609 Pain in unspecified limb: Secondary | ICD-10-CM

## 2016-10-15 DIAGNOSIS — Q828 Other specified congenital malformations of skin: Secondary | ICD-10-CM

## 2016-10-15 NOTE — Telephone Encounter (Signed)
Pt called and said you mentioned antibiotics at today's appt and she said she has taken low dose amoxicillin and it has not bothered her if you wanted to give her an antibiotic still.

## 2016-10-15 NOTE — Telephone Encounter (Signed)
We can start with the topical. If she is having any worsening of symptoms then we will start one. The infection was localized.

## 2016-10-15 NOTE — Telephone Encounter (Signed)
I informed pt that Dr. Jacqualyn Posey wanted her to be on a topical antibiotic at this time.

## 2016-10-16 ENCOUNTER — Telehealth: Payer: Self-pay | Admitting: *Deleted

## 2016-10-16 NOTE — Progress Notes (Signed)
Subjective: 71 y.o. returns the office today for painful, elongated, thickened toenails which she cannot trim herself. Denies any redness or drainage around the nails. She also has painful calluses to the balls of both of her feet. She states that she proceeded acid treatment on this that she does not want have this performed today. She has noticed that her right toenails become more painful and starting to turn darker color. Denies any redness or drainage or any swelling to the toenail. Denies any acute changes since last appointment and no new complaints today. Denies any systemic complaints such as fevers, chills, nausea, vomiting.   Objective: AAO 3, NAD DP/PT pulses palpable, CRT less than 3 seconds Nails hypertrophic, dystrophic, elongated, brittle, discolored 10. There is tenderness overlying the nails 1-5 bilaterally. On the right hallux toenail upon debridement there is a small amount of superficial purulence expressed from underneath the toenail. The toenail distally be somewhat discolored with yellow-brown discoloration. After debridement of the nail and removal of the purulence there was significant improvement tenderness the nail. Hyperkeratotic lesions bilateral submetatarsal 2. Upon debridement no underlying ulceration, drainage or any signs of infection. No other open lesions or pre-ulcer lesions identified bilaterally. No pain with calf compression, swelling, warmth, erythema.  Assessment: Patient presents with symptomatic onychomycosis with superficial abscess right hallux; hyperkeratotic lesions   Plan: -Treatment options including alternatives, risks, complications were discussed -Nails sharply debrided 10. Upon debridement of the right hallux toenail there was superficial purulence expressed. At today's appointment I discussed oral abx but she states she has multiple allergies and wishes to hold off on oral. Will continue with neosporin and a bandage daily. Epsom salt soaks.  If there is any worsening will start oral abx.  -Hyperkeratotic lesions were debrided 2 without complications or bleeding.  -Discussed daily foot inspection. If there are any changes, to call the office immediately.  -Follow-up in 2-3 weeks to check abscess right hallux toenail or sooner if any problems are to arise. In the meantime, encouraged to call the office with any questions, concerns, changes symptoms.  Celesta Gentile, DPM

## 2016-10-16 NOTE — Telephone Encounter (Signed)
CALLED AND LEFT A MESSAGE FOR THE PATIENT TO CALL ME BACK IN THE [place] OFFICE AND THAT I WAS CHECKING ON PATIENT AND TO ASK IF SHE WAS A DIABETIC SO WE COULD CODE THE VISIT RIGHT. Toni Berger

## 2016-10-19 DIAGNOSIS — F0632 Mood disorder due to known physiological condition with major depressive-like episode: Secondary | ICD-10-CM | POA: Diagnosis not present

## 2016-10-28 DIAGNOSIS — F0632 Mood disorder due to known physiological condition with major depressive-like episode: Secondary | ICD-10-CM | POA: Diagnosis not present

## 2016-10-29 ENCOUNTER — Ambulatory Visit: Payer: Medicare Other | Admitting: Podiatry

## 2016-10-31 ENCOUNTER — Other Ambulatory Visit: Payer: Self-pay | Admitting: Family Medicine

## 2016-10-31 NOTE — Telephone Encounter (Signed)
Called into pharmacy

## 2016-10-31 NOTE — Telephone Encounter (Signed)
Last OV 07/30/16 last filled 07/30/16 60 2rf

## 2016-11-02 ENCOUNTER — Encounter: Payer: Self-pay | Admitting: Family Medicine

## 2016-11-02 ENCOUNTER — Ambulatory Visit (INDEPENDENT_AMBULATORY_CARE_PROVIDER_SITE_OTHER): Payer: Medicare Other | Admitting: Psychology

## 2016-11-02 ENCOUNTER — Ambulatory Visit (INDEPENDENT_AMBULATORY_CARE_PROVIDER_SITE_OTHER): Payer: Medicare Other | Admitting: Family Medicine

## 2016-11-02 VITALS — BP 118/80 | HR 57 | Temp 98.3°F | Wt 180.0 lb

## 2016-11-02 DIAGNOSIS — F4321 Adjustment disorder with depressed mood: Secondary | ICD-10-CM

## 2016-11-02 DIAGNOSIS — Z634 Disappearance and death of family member: Secondary | ICD-10-CM

## 2016-11-02 DIAGNOSIS — I10 Essential (primary) hypertension: Secondary | ICD-10-CM

## 2016-11-02 DIAGNOSIS — F4329 Adjustment disorder with other symptoms: Secondary | ICD-10-CM | POA: Diagnosis not present

## 2016-11-02 DIAGNOSIS — F321 Major depressive disorder, single episode, moderate: Secondary | ICD-10-CM | POA: Diagnosis not present

## 2016-11-02 LAB — COMPREHENSIVE METABOLIC PANEL
ALT: 12 U/L (ref 0–35)
AST: 16 U/L (ref 0–37)
Albumin: 4.3 g/dL (ref 3.5–5.2)
Alkaline Phosphatase: 105 U/L (ref 39–117)
BILIRUBIN TOTAL: 0.6 mg/dL (ref 0.2–1.2)
BUN: 18 mg/dL (ref 6–23)
CALCIUM: 9.7 mg/dL (ref 8.4–10.5)
CHLORIDE: 106 meq/L (ref 96–112)
CO2: 28 meq/L (ref 19–32)
Creatinine, Ser: 0.87 mg/dL (ref 0.40–1.20)
GFR: 68.27 mL/min (ref >60.00)
Glucose, Bld: 94 mg/dL (ref 70–99)
Potassium: 4.7 mEq/L (ref 3.5–5.1)
Sodium: 142 mEq/L (ref 135–145)
Total Protein: 7.3 g/dL (ref 6.0–8.3)

## 2016-11-02 NOTE — Progress Notes (Signed)
Pre visit review using our clinic review tool, if applicable. No additional management support is needed unless otherwise documented below in the visit note. 

## 2016-11-02 NOTE — Assessment & Plan Note (Signed)
At goal. Continue current medications. Check CMP today. 

## 2016-11-02 NOTE — Assessment & Plan Note (Signed)
She continues to have issues with grief. Suspect this is more complicated with depression and anxiety now. Discussed the importance of treating this and that an SSRI would likely be beneficial given her continued symptoms that represent depression and anxiety though she continues to decline taking a medicine like this. Discussed getting her back in to see Bambi she had good benefit with her previously though she is unsure if she'll be able to get an appointment at a time that works for her. She does note some rare thoughts of being better off not being around though she has no intent or plan to harm herself. I discussed that if she ever developed intent or plan needs to seek medical attention immediately.

## 2016-11-02 NOTE — Patient Instructions (Signed)
Nice to see you. Please contact Bambi to see about scheduling an appointment with her. We will check some lab work today and contact with the results. If you ever develop thoughts of harming yourself or the intent or plan to harm yourself please seek medical attention medially.

## 2016-11-02 NOTE — Progress Notes (Signed)
  [name], MD Phone: [phone]  Toni Berger is a 71 y.o. female who presents today for f/u.  HYPERTENSION  Disease Monitoring  Home BP Monitoring rarely checks, reports it is fine Chest pain- no    Dyspnea- no Medications  Compliance-  Taking amlodipine, lisinopril.  Edema- stable chronic right ankle edema  Patient notes continued issues with grief. Notes she's "not doing worth a crap." Klonopin is helpful and she takes it 1-2 times a day. On one occasion she took 2 tablets at a time. Has outbursts where she'll just start crying and yelling. She still can't eat at the table. She's had little luck finding a Social worker. She repeatedly states during the visit that everybody seems to be dropping her. She reports at times she has thought about being better off not being around though she has no intent or plan to harm herself.  PMH: nonsmoker.   ROS see history of present illness  Objective  Physical Exam Vitals:   11/02/16 0955  BP: 118/80  Pulse: (!) 57  Temp: 98.3 F (36.8 C)    BP Readings from Last 3 Encounters:  11/02/16 118/80  10/15/16 (!) 145/82  07/30/16 118/72   Wt Readings from Last 3 Encounters:  11/02/16 180 lb (81.6 kg)  07/30/16 180 lb 6.4 oz (81.8 kg)  06/05/16 179 lb 9.6 oz (81.5 kg)    Physical Exam  Constitutional: No distress.  HENT:  Head: Normocephalic and atraumatic.  Cardiovascular: Normal rate, regular rhythm and normal heart sounds.   Pulmonary/Chest: Effort normal.  Musculoskeletal: She exhibits no edema.  Neurological: She is alert. Gait normal.  Skin: Skin is warm and dry. She is not diaphoretic.  Psychiatric:  Mood depressed and anxious, affect flat and intermittently tearful     Assessment/Plan: Please see individual problem list.  HYPERTENSION, BENIGN At goal. Continue current medications. Check CMP today.  Complicated grief She continues to have issues with grief. Suspect this is more complicated with depression and  anxiety now. Discussed the importance of treating this and that an SSRI would likely be beneficial given her continued symptoms that represent depression and anxiety though she continues to decline taking a medicine like this. Discussed getting her back in to see Bambi she had good benefit with her previously though she is unsure if she'll be able to get an appointment at a time that works for her. She does note some rare thoughts of being better off not being around though she has no intent or plan to harm herself. I discussed that if she ever developed intent or plan needs to seek medical attention immediately.   Orders Placed This Encounter  Procedures  . Comp Met (CMET)     [name], MD [place]

## 2016-11-09 ENCOUNTER — Ambulatory Visit (INDEPENDENT_AMBULATORY_CARE_PROVIDER_SITE_OTHER): Payer: Medicare Other | Admitting: Psychology

## 2016-11-09 DIAGNOSIS — F321 Major depressive disorder, single episode, moderate: Secondary | ICD-10-CM | POA: Diagnosis not present

## 2016-11-17 ENCOUNTER — Telehealth: Payer: Self-pay | Admitting: *Deleted

## 2016-11-17 NOTE — Telephone Encounter (Signed)
fyi

## 2016-11-17 NOTE — Telephone Encounter (Signed)
Patient wanted to Toni Berger Dr. Caryl Bis that she will participate in grief yoga on 11/18/16, this will continue once a week for six weeks.  Pt contact [phone]

## 2016-11-26 ENCOUNTER — Ambulatory Visit (INDEPENDENT_AMBULATORY_CARE_PROVIDER_SITE_OTHER): Payer: Medicare Other | Admitting: Psychology

## 2016-11-26 DIAGNOSIS — F321 Major depressive disorder, single episode, moderate: Secondary | ICD-10-CM

## 2016-11-30 ENCOUNTER — Telehealth: Payer: Self-pay | Admitting: Family Medicine

## 2016-11-30 NOTE — Telephone Encounter (Signed)
She can take half a milligram of melatonin for sleep. This should be safe for her to take for sleep.

## 2016-11-30 NOTE — Telephone Encounter (Signed)
Please advise 

## 2016-11-30 NOTE — Telephone Encounter (Signed)
Pt called and wanted to know if she took Melatonin 3 mg if it would be ok. She states that she does not always having trouble sleeping but once in a while. Pt states that she is worried what it might do if she takes it especially since she lives alone. Please advise, thank you!  Call pt @ [phone]

## 2016-12-01 NOTE — Telephone Encounter (Signed)
Noted  

## 2016-12-01 NOTE — Telephone Encounter (Signed)
Patient states she has actually been sleeping well the last two nights, patient states she takes a klonopin 15 minutes before going to sleep and she is sleeping great and will not need melatonin

## 2016-12-03 ENCOUNTER — Ambulatory Visit (INDEPENDENT_AMBULATORY_CARE_PROVIDER_SITE_OTHER): Payer: Medicare Other | Admitting: Psychology

## 2016-12-03 DIAGNOSIS — F321 Major depressive disorder, single episode, moderate: Secondary | ICD-10-CM | POA: Diagnosis not present

## 2016-12-10 ENCOUNTER — Ambulatory Visit (INDEPENDENT_AMBULATORY_CARE_PROVIDER_SITE_OTHER): Payer: Medicare Other | Admitting: Psychology

## 2016-12-10 DIAGNOSIS — F321 Major depressive disorder, single episode, moderate: Secondary | ICD-10-CM

## 2016-12-14 ENCOUNTER — Ambulatory Visit (INDEPENDENT_AMBULATORY_CARE_PROVIDER_SITE_OTHER): Payer: Medicare Other | Admitting: Psychology

## 2016-12-14 DIAGNOSIS — F321 Major depressive disorder, single episode, moderate: Secondary | ICD-10-CM | POA: Diagnosis not present

## 2016-12-17 ENCOUNTER — Ambulatory Visit (INDEPENDENT_AMBULATORY_CARE_PROVIDER_SITE_OTHER): Payer: Medicare Other | Admitting: Podiatry

## 2016-12-17 ENCOUNTER — Encounter: Payer: Self-pay | Admitting: Podiatry

## 2016-12-17 DIAGNOSIS — B351 Tinea unguium: Secondary | ICD-10-CM | POA: Diagnosis not present

## 2016-12-17 DIAGNOSIS — M79609 Pain in unspecified limb: Secondary | ICD-10-CM

## 2016-12-17 DIAGNOSIS — Q828 Other specified congenital malformations of skin: Secondary | ICD-10-CM

## 2016-12-17 NOTE — Progress Notes (Signed)
Complaint:  Visit Type: Patient returns to my office for continued preventative foot care services. Complaint: Patient states" my nails have grown long and thick and become painful to walk and wear shoes"  The patient presents for preventative foot care services. No changes to ROS.  She says she has painful callus left foot. Podiatric Exam: Vascular: dorsalis pedis and posterior tibial pulses are palpable bilateral. Capillary return is immediate. Temperature gradient is WNL. Skin turgor WNL  Sensorium: Normal Semmes Weinstein monofilament test. Normal tactile sensation bilaterally. Nail Exam: Pt has thick disfigured discolored nails with subungual debris noted bilateral entire nail hallux through fifth toenails Ulcer Exam: There is no evidence of ulcer or pre-ulcerative changes or infection. Orthopedic Exam: Muscle tone and strength are WNL. No limitations in general ROM. No crepitus or effusions noted. Foot type and digits show no abnormalities. Bony prominences are unremarkable. Skin:  Porokeratosis sub 2 left. No infection or ulcers  Diagnosis:  Onychomycosis, , Pain in right toe, pain in left toes, Porokeratosis Treatment & Plan Procedures and Treatment: Consent by patient was obtained for treatment procedures. The patient understood the discussion of treatment and procedures well. All questions were answered thoroughly reviewed. Debridement of mycotic and hypertrophic toenails, 1 through 5 bilateral and clearing of subungual debris. No ulceration, no infection noted.  Return Visit-Office Procedure: Patient instructed to return to the office for a follow up visit 9 weeks for continued evaluation and treatment.    Gardiner Barefoot DPM

## 2016-12-21 ENCOUNTER — Ambulatory Visit (INDEPENDENT_AMBULATORY_CARE_PROVIDER_SITE_OTHER): Payer: Medicare Other | Admitting: Psychology

## 2016-12-21 DIAGNOSIS — F321 Major depressive disorder, single episode, moderate: Secondary | ICD-10-CM

## 2016-12-28 ENCOUNTER — Ambulatory Visit: Payer: Medicare Other | Admitting: Psychology

## 2017-01-04 ENCOUNTER — Ambulatory Visit (INDEPENDENT_AMBULATORY_CARE_PROVIDER_SITE_OTHER): Payer: Medicare Other | Admitting: Psychology

## 2017-01-04 DIAGNOSIS — F321 Major depressive disorder, single episode, moderate: Secondary | ICD-10-CM | POA: Diagnosis not present

## 2017-01-11 ENCOUNTER — Telehealth: Payer: Self-pay | Admitting: Family Medicine

## 2017-01-11 ENCOUNTER — Ambulatory Visit (INDEPENDENT_AMBULATORY_CARE_PROVIDER_SITE_OTHER): Payer: Medicare Other | Admitting: Psychology

## 2017-01-11 DIAGNOSIS — F321 Major depressive disorder, single episode, moderate: Secondary | ICD-10-CM | POA: Diagnosis not present

## 2017-01-11 NOTE — Telephone Encounter (Signed)
Patient notified

## 2017-01-11 NOTE — Telephone Encounter (Signed)
Please advise 

## 2017-01-11 NOTE — Telephone Encounter (Signed)
Patient does not need any labs prior to follow-up.

## 2017-01-11 NOTE — Telephone Encounter (Signed)
Pt called about lab work. Is pt due to have any labs? Before appt?  Call pt @ [phone]. Thank you!

## 2017-01-18 ENCOUNTER — Ambulatory Visit (INDEPENDENT_AMBULATORY_CARE_PROVIDER_SITE_OTHER): Payer: Medicare Other | Admitting: Psychology

## 2017-01-18 DIAGNOSIS — F321 Major depressive disorder, single episode, moderate: Secondary | ICD-10-CM | POA: Diagnosis not present

## 2017-01-19 ENCOUNTER — Ambulatory Visit (INDEPENDENT_AMBULATORY_CARE_PROVIDER_SITE_OTHER): Payer: Medicare Other | Admitting: Podiatry

## 2017-01-19 DIAGNOSIS — L03039 Cellulitis of unspecified toe: Secondary | ICD-10-CM | POA: Diagnosis not present

## 2017-01-20 NOTE — Progress Notes (Signed)
   Subjective: Patient presents today for evaluation of soreness of the right great toe nail. She reports associated purulent drainage and states the nail is pulling up from the nail bed. Patient states that the pain has been present for a few weeks now. Patient presents today for further treatment and evaluation.  Objective:  General: Well developed, nourished, in no acute distress, alert and oriented x3   Dermatology: Purulent drainage noted underlying right great toe nail plate. Skin is warm, dry and supple. Pain on palpation noted to the border of the nail fold. The remaining nails appear unremarkable at this time. There are no open sores, lesions.  Vascular: Dorsalis Pedis artery and Posterior Tibial artery pedal pulses palpable. No lower extremity edema noted.   Neruologic: Grossly intact via light touch bilateral.  Musculoskeletal: Muscular strength within normal limits in all groups bilateral. Normal range of motion noted to all pedal and ankle joints.   Assesement: #1 Purulent toenail right great toe  Plan of Care:  1. Patient evaluated.  2. Discussed treatment alternatives and plan of care. Explained nail avulsion procedure and post procedure course to patient. 3. Patient opted for temporary total nail avulsion.  4. Prior to procedure, local anesthesia infiltration utilized using 3 ml of a 50:50 mixture of 2% plain lidocaine and 0.5% plain marcaine in a normal hallux block fashion and a betadine prep performed.  5. Light dressing applied. 6. Return to clinic in 2 weeks.   Edrick Kins, DPM [facility]  Dr. Edrick Kins, [place]                                        [place],  [address]                Office [phone]  Fax [phone]

## 2017-01-25 ENCOUNTER — Ambulatory Visit (INDEPENDENT_AMBULATORY_CARE_PROVIDER_SITE_OTHER): Payer: Medicare Other | Admitting: Psychology

## 2017-01-25 ENCOUNTER — Telehealth: Payer: Self-pay | Admitting: Family Medicine

## 2017-01-25 DIAGNOSIS — F321 Major depressive disorder, single episode, moderate: Secondary | ICD-10-CM | POA: Diagnosis not present

## 2017-01-25 NOTE — Telephone Encounter (Signed)
Appointment cancelled

## 2017-01-25 NOTE — Telephone Encounter (Signed)
Patient would need to be seen in the office to consider dose adjustments of this medication.

## 2017-01-25 NOTE — Telephone Encounter (Signed)
Pt called about medication of clonazePAM (KLONOPIN) 1 MG tablet. Pt is still grieving of the loss of her husband. Pt wants to know if her Rx can be dosage to be increased. Please advise? Thank you!  Pharmacy is [place], [place]  Call pt @ [phone]. Thank you!

## 2017-01-25 NOTE — Telephone Encounter (Signed)
You can cancel her appointment for later in the month. Thanks.

## 2017-01-25 NOTE — Telephone Encounter (Signed)
Patient is scheduled for follow up/anxiety tomorrow 30 min, do you want me to cancel her follow up appmt for 02/04/17, patient wanted to know if she still needed the appmt for that date

## 2017-01-25 NOTE — Telephone Encounter (Signed)
Please advise 

## 2017-01-26 ENCOUNTER — Encounter: Payer: Self-pay | Admitting: Family Medicine

## 2017-01-26 ENCOUNTER — Ambulatory Visit (INDEPENDENT_AMBULATORY_CARE_PROVIDER_SITE_OTHER): Payer: Medicare Other | Admitting: Family Medicine

## 2017-01-26 DIAGNOSIS — F4321 Adjustment disorder with depressed mood: Secondary | ICD-10-CM

## 2017-01-26 DIAGNOSIS — F4329 Adjustment disorder with other symptoms: Secondary | ICD-10-CM | POA: Diagnosis not present

## 2017-01-26 DIAGNOSIS — Z634 Disappearance and death of family member: Secondary | ICD-10-CM | POA: Diagnosis not present

## 2017-01-26 MED ORDER — CLONAZEPAM 1 MG PO TABS: ORAL_TABLET | ORAL | 1 refills | Status: DC

## 2017-01-26 NOTE — Assessment & Plan Note (Signed)
Continues to have issues with some grief and mostly anxiety. No depression. She does not want to proceed with any additional treatment other than Klonopin. She occasionally takes it up to 3 times a day though mostly takes it 1-2 times daily. We provided her with a prescription reflecting this. Advised that if she needs this 3 times a day more than once every 2 weeks or so we will need to consider and alternative medication. Advised that this can make her drowsy. She will continue to see psychology. She'll continue to monitor.

## 2017-01-26 NOTE — Patient Instructions (Signed)
Nice to see you. We will fax in your Klonopin prescription. Do not use this excessively. It may make you drowsy so be aware of this. Please continue to see psychology. If you develop worsening anxiety or develop thoughts of harming your self or others please seek medical attention.

## 2017-01-26 NOTE — Progress Notes (Signed)
  [name], MD Phone: [phone]  Toni Berger is a 71 y.o. female who presents today for follow-up.  Anxiety: Patient notes lots of stress and anxiety. Notes it is a little bit better than it was previously. She's been seeing a therapist once a week. She's been taking Klonopin 0-3 times daily. Mostly takes it 1-2 times a day. Some days are worse than others and she'll take an extra dose. She notes no depression. No SI or HI. The Klonopin does not make her drowsy. It just helps calm her.  ROS see history of present illness  Objective  Physical Exam Vitals:   01/26/17 1432  BP: 140/90  Pulse: (!) 58  Temp: 98.5 F (36.9 C)    BP Readings from Last 3 Encounters:  01/26/17 140/90  11/02/16 118/80  10/15/16 (!) 145/82   Wt Readings from Last 3 Encounters:  01/26/17 180 lb 3.2 oz (81.7 kg)  11/02/16 180 lb (81.6 kg)  07/30/16 180 lb 6.4 oz (81.8 kg)    Physical Exam  Constitutional: No distress.  Cardiovascular: Normal rate, regular rhythm and normal heart sounds.   Pulmonary/Chest: Effort normal and breath sounds normal.  Neurological: She is alert. Gait normal.  Skin: She is not diaphoretic.     Assessment/Plan: Please see individual problem list.  Complicated grief Continues to have issues with some grief and mostly anxiety. No depression. She does not want to proceed with any additional treatment other than Klonopin. She occasionally takes it up to 3 times a day though mostly takes it 1-2 times daily. We provided her with a prescription reflecting this. Advised that if she needs this 3 times a day more than once every 2 weeks or so we will need to consider and alternative medication. Advised that this can make her drowsy. She will continue to see psychology. She'll continue to monitor.  [name], MD [place]

## 2017-02-01 ENCOUNTER — Ambulatory Visit: Payer: Medicare Other | Admitting: Psychology

## 2017-02-04 ENCOUNTER — Ambulatory Visit: Payer: Self-pay | Admitting: Family Medicine

## 2017-02-09 ENCOUNTER — Ambulatory Visit: Payer: Medicare Other | Admitting: Podiatry

## 2017-02-11 ENCOUNTER — Ambulatory Visit (INDEPENDENT_AMBULATORY_CARE_PROVIDER_SITE_OTHER): Payer: Medicare Other | Admitting: Psychology

## 2017-02-11 DIAGNOSIS — F321 Major depressive disorder, single episode, moderate: Secondary | ICD-10-CM | POA: Diagnosis not present

## 2017-02-15 ENCOUNTER — Ambulatory Visit (INDEPENDENT_AMBULATORY_CARE_PROVIDER_SITE_OTHER): Payer: Medicare Other | Admitting: Psychology

## 2017-02-15 DIAGNOSIS — F321 Major depressive disorder, single episode, moderate: Secondary | ICD-10-CM

## 2017-02-22 ENCOUNTER — Ambulatory Visit: Payer: Medicare Other | Admitting: Psychology

## 2017-03-01 ENCOUNTER — Ambulatory Visit: Payer: Medicare Other | Admitting: Psychology

## 2017-03-08 ENCOUNTER — Ambulatory Visit: Payer: Medicare Other | Admitting: Psychology

## 2017-03-15 ENCOUNTER — Ambulatory Visit (INDEPENDENT_AMBULATORY_CARE_PROVIDER_SITE_OTHER): Payer: Medicare Other | Admitting: Psychology

## 2017-03-15 DIAGNOSIS — F321 Major depressive disorder, single episode, moderate: Secondary | ICD-10-CM | POA: Diagnosis not present

## 2017-03-18 ENCOUNTER — Encounter: Payer: Self-pay | Admitting: Podiatry

## 2017-03-18 ENCOUNTER — Ambulatory Visit (INDEPENDENT_AMBULATORY_CARE_PROVIDER_SITE_OTHER): Payer: Medicare Other | Admitting: Podiatry

## 2017-03-18 DIAGNOSIS — B351 Tinea unguium: Secondary | ICD-10-CM | POA: Diagnosis not present

## 2017-03-18 DIAGNOSIS — M79609 Pain in unspecified limb: Secondary | ICD-10-CM | POA: Diagnosis not present

## 2017-03-18 DIAGNOSIS — Q828 Other specified congenital malformations of skin: Secondary | ICD-10-CM

## 2017-03-18 NOTE — Progress Notes (Signed)
Complaint:  Visit Type: Patient returns to my office for continued preventative foot care services. Complaint: Patient states" my nails have grown long and thick and become painful to walk and wear shoes"  The patient presents for preventative foot care services. No changes to ROS.  She says she has painful callus left foot. Her right toenail has healed with no evidence of infection. Podiatric Exam: Vascular: dorsalis pedis and posterior tibial pulses are palpable bilateral. Capillary return is immediate. Temperature gradient is WNL. Skin turgor WNL  Sensorium: Normal Semmes Weinstein monofilament test. Normal tactile sensation bilaterally. Nail Exam: Pt has thick disfigured discolored nails with subungual debris noted bilateral entire nail hallux through fifth toenails Ulcer Exam: There is no evidence of ulcer or pre-ulcerative changes or infection. Orthopedic Exam: Muscle tone and strength are WNL. No limitations in general ROM. No crepitus or effusions noted. Foot type and digits show no abnormalities. Bony prominences are unremarkable. Skin:  Porokeratosis sub 2 left. No infection or ulcers  Diagnosis:  Onychomycosis, , Pain in right toe, pain in left toes, Porokeratosis Treatment & Plan Procedures and Treatment: Consent by patient was obtained for treatment procedures. The patient understood the discussion of treatment and procedures well. All questions were answered thoroughly reviewed. Debridement of mycotic and hypertrophic toenails, 1 through 5 bilateral and clearing of subungual debris. No ulceration, no infection noted.  Return Visit-Office Procedure: Patient instructed to return to the office for a follow up visit 9 weeks for continued evaluation and treatment.    Gardiner Barefoot DPM

## 2017-03-22 ENCOUNTER — Ambulatory Visit (INDEPENDENT_AMBULATORY_CARE_PROVIDER_SITE_OTHER): Payer: Medicare Other | Admitting: Psychology

## 2017-03-22 DIAGNOSIS — F321 Major depressive disorder, single episode, moderate: Secondary | ICD-10-CM

## 2017-04-02 ENCOUNTER — Telehealth: Payer: Self-pay | Admitting: Family Medicine

## 2017-04-02 NOTE — Telephone Encounter (Signed)
Patient notified we do not have this on file

## 2017-04-02 NOTE — Telephone Encounter (Signed)
Pt called and wanted to know if we have her blood type on file. Please advise, thank you!  Call pt @ [phone]

## 2017-04-06 ENCOUNTER — Other Ambulatory Visit: Payer: Self-pay | Admitting: Family Medicine

## 2017-04-06 ENCOUNTER — Other Ambulatory Visit: Payer: Self-pay | Admitting: *Deleted

## 2017-04-06 MED ORDER — CLONAZEPAM 1 MG PO TABS: ORAL_TABLET | ORAL | 1 refills | Status: DC

## 2017-04-06 NOTE — Telephone Encounter (Signed)
Last OV 01/26/17 last filled 01/26/17 90 1rf

## 2017-04-06 NOTE — Telephone Encounter (Signed)
Pt requested a medication refill for [place]

## 2017-04-07 NOTE — Telephone Encounter (Signed)
faxed

## 2017-04-09 ENCOUNTER — Ambulatory Visit (INDEPENDENT_AMBULATORY_CARE_PROVIDER_SITE_OTHER): Payer: Medicare Other | Admitting: Family Medicine

## 2017-04-09 ENCOUNTER — Encounter: Payer: Self-pay | Admitting: Family Medicine

## 2017-04-09 ENCOUNTER — Ambulatory Visit (INDEPENDENT_AMBULATORY_CARE_PROVIDER_SITE_OTHER): Payer: Medicare Other

## 2017-04-09 VITALS — BP 128/66 | HR 64 | Temp 97.9°F | Ht 67.0 in | Wt 184.8 lb

## 2017-04-09 DIAGNOSIS — F4321 Adjustment disorder with depressed mood: Secondary | ICD-10-CM

## 2017-04-09 DIAGNOSIS — R0781 Pleurodynia: Secondary | ICD-10-CM

## 2017-04-09 DIAGNOSIS — F4329 Adjustment disorder with other symptoms: Secondary | ICD-10-CM | POA: Diagnosis not present

## 2017-04-09 DIAGNOSIS — S2242XD Multiple fractures of ribs, left side, subsequent encounter for fracture with routine healing: Secondary | ICD-10-CM

## 2017-04-09 DIAGNOSIS — Z634 Disappearance and death of family member: Secondary | ICD-10-CM

## 2017-04-09 DIAGNOSIS — R079 Chest pain, unspecified: Secondary | ICD-10-CM | POA: Diagnosis not present

## 2017-04-09 DIAGNOSIS — I10 Essential (primary) hypertension: Secondary | ICD-10-CM | POA: Diagnosis not present

## 2017-04-09 DIAGNOSIS — S299XXA Unspecified injury of thorax, initial encounter: Secondary | ICD-10-CM | POA: Diagnosis not present

## 2017-04-09 LAB — CBC
HCT: 39 % (ref 36.0–46.0)
HEMOGLOBIN: 12.7 g/dL (ref 12.0–15.0)
MCHC: 32.5 g/dL (ref 30.0–36.0)
MCV: 88.7 fl (ref 78.0–100.0)
PLATELETS: 290 10*3/uL (ref 150.0–400.0)
RBC: 4.39 Mil/uL (ref 3.87–5.11)
RDW: 14.2 % (ref 11.5–15.5)
WBC: 7.7 10*3/uL (ref 4.0–10.5)

## 2017-04-09 LAB — TSH: TSH: 2.2 u[IU]/mL (ref 0.35–4.50)

## 2017-04-09 LAB — COMPREHENSIVE METABOLIC PANEL
ALBUMIN: 4.1 g/dL (ref 3.5–5.2)
ALT: 14 U/L (ref 0–35)
AST: 15 U/L (ref 0–37)
Alkaline Phosphatase: 114 U/L (ref 39–117)
BILIRUBIN TOTAL: 0.4 mg/dL (ref 0.2–1.2)
BUN: 20 mg/dL (ref 6–23)
CALCIUM: 9.8 mg/dL (ref 8.4–10.5)
CO2: 27 mEq/L (ref 19–32)
CREATININE: 0.88 mg/dL (ref 0.40–1.20)
Chloride: 106 mEq/L (ref 96–112)
GFR: 67.29 mL/min (ref >60.00)
Glucose, Bld: 83 mg/dL (ref 70–99)
Potassium: 4.7 mEq/L (ref 3.5–5.1)
SODIUM: 141 meq/L (ref 135–145)
TOTAL PROTEIN: 7.1 g/dL (ref 6.0–8.3)

## 2017-04-09 NOTE — Progress Notes (Signed)
Pre visit review using our clinic review tool, if applicable. No additional management support is needed unless otherwise documented below in the visit note. 

## 2017-04-09 NOTE — Progress Notes (Signed)
  [name], MD Phone: [phone]  Toni Berger is a 71 y.o. female who presents today for follow-up.  Grief: Patient notes this is progressing some. She's had some breakthroughs with therapy. Therapy has been somewhat helpful. She's also gotten into volunteering with the [employer]. She has a difficult time telling whether or not she is anxious or depressed. No SI or HI. She takes Klonopin 2-3 times a day.  Hypertension: Not checking. She is taking amlodipine and lisinopril. No chest pain, shortness breath, or edema.  History of left rib fracture: Still occasionally hurts her depending on how she moves. Also can hurt if she stays in one position and then moves. Notes no pain with cough. No shortness of breath. Notes the discomfort is sharp.  PMH: nonsmoker.   ROS see history of present illness  Objective  Physical Exam Vitals:   04/09/17 1055  BP: 128/66  Pulse: 64  Temp: 97.9 F (36.6 C)    BP Readings from Last 3 Encounters:  04/09/17 128/66  01/26/17 140/90  11/02/16 118/80   Wt Readings from Last 3 Encounters:  04/09/17 184 lb 12.8 oz (83.8 kg)  01/26/17 180 lb 3.2 oz (81.7 kg)  11/02/16 180 lb (81.6 kg)    Physical Exam  Constitutional: No distress.  Cardiovascular: Normal rate, regular rhythm and normal heart sounds.   Pulmonary/Chest: Effort normal and breath sounds normal.    Musculoskeletal: She exhibits no edema.  Neurological: She is alert. Gait normal.  Skin: She is not diaphoretic.     Assessment/Plan: Please see individual problem list.  HYPERTENSION, BENIGN At goal. Continue current medications. Check lab work.  Closed rib fracture Continues to have some discomfort with this. Somewhat tender on exam though no other abnormalities. Discussed option of seeing a surgeon versus doing an x-ray today. We'll complete an x-ray and then go from there.  Complicated grief Seems to be doing somewhat better. Has been seeing a therapist and using  Klonopin. Encouraged her to continue both of these if needed. She'll continue to stay active.   Orders Placed This Encounter  Procedures  . DG Ribs Unilateral W/Chest Left    Standing Status:   Future    Number of Occurrences:   1    Standing Expiration Date:   06/10/2018    Order Specific Question:   Reason for Exam (SYMPTOM  OR DIAGNOSIS REQUIRED)    Answer:   history left 6th and 7th rib fractures, persistent pain    Order Specific Question:   Preferred imaging location?    Answer:   [facility] Specific Question:   Radiology Contrast Protocol - do NOT remove file path    Answer:   \\[email or web address].pdf  . CBC  . TSH  . Comp Met (CMET)   [name], MD [place]

## 2017-04-09 NOTE — Assessment & Plan Note (Signed)
At goal.  Continue current medications.  Check lab work. 

## 2017-04-09 NOTE — Patient Instructions (Addendum)
Nice to see you. Please continue to monitor depression and anxiety. You can continue to see Bambi as well. Please monitor your rib pain. If it worsens or you develop trouble breathing please be evaluated.

## 2017-04-09 NOTE — Assessment & Plan Note (Signed)
Seems to be doing somewhat better. Has been seeing a therapist and using Klonopin. Encouraged her to continue both of these if needed. She'll continue to stay active.

## 2017-04-09 NOTE — Assessment & Plan Note (Signed)
Continues to have some discomfort with this. Somewhat tender on exam though no other abnormalities. Discussed option of seeing a surgeon versus doing an x-ray today. We'll complete an x-ray and then go from there.

## 2017-04-12 ENCOUNTER — Ambulatory Visit: Payer: Medicare Other | Admitting: Psychology

## 2017-04-12 ENCOUNTER — Telehealth: Payer: Self-pay | Admitting: Family Medicine

## 2017-04-12 NOTE — Telephone Encounter (Signed)
fyi

## 2017-04-12 NOTE — Telephone Encounter (Signed)
FYI - Pt called and wanted Dr. Caryl Bis that she will not be returning to [facility].

## 2017-04-12 NOTE — Telephone Encounter (Signed)
Noted  

## 2017-05-04 ENCOUNTER — Telehealth: Payer: Self-pay | Admitting: *Deleted

## 2017-05-04 NOTE — Telephone Encounter (Signed)
Noted. We can check and see if there are any support groups that would provide her somebody to talk to. We could consider depression medication though she is right in that there are possible side effects and she would not be able to stop it right away without risk of withdrawal. We could have her see another therapist or a psychiatrist as well. Please relay these things to her and we will check on a support group if she would like.

## 2017-05-04 NOTE — Telephone Encounter (Signed)
Pt has requested a call to discuss a medication for depression, she does not want an appt.  Pt refused to talk to talk to team health -pt stated that she was not suicidal .  Pt contact [phone]

## 2017-05-04 NOTE — Telephone Encounter (Signed)
Patient states she feels like nothing she is doing is going right she feels trapped. She does not have friends or family and she does not have someone to talk to. Patient states she does not know if she wants to start depression medication due to side effects and not being able to stop it right away if needed. She does not feel suicidal and does not want to harm herself. She does not want to come in for an appointment.Patient states she feels defeated. Informed patient to call EMS if she has any feelings of harming herself or others or any suicidal thought. Patient states she will definitely call ems or hit her medical alert button.

## 2017-05-05 NOTE — Telephone Encounter (Signed)
Patient states she feels much better today, she states she does have an appointment next Thursday with Bambi.Patient will try to find a group to join.Patient does sound a lot better today.

## 2017-05-05 NOTE — Telephone Encounter (Signed)
Noted  

## 2017-05-13 ENCOUNTER — Telehealth: Payer: Self-pay | Admitting: Family Medicine

## 2017-05-13 ENCOUNTER — Ambulatory Visit: Payer: Medicare Other | Admitting: Psychology

## 2017-05-13 NOTE — Telephone Encounter (Signed)
fyi

## 2017-05-13 NOTE — Telephone Encounter (Signed)
Noted  

## 2017-05-13 NOTE — Telephone Encounter (Signed)
FYI - Pt called to inform Dr. Caryl Bis that she has cancelled her appts with [facility]. She states that she felt like they were going over the same thing.

## 2017-06-01 ENCOUNTER — Ambulatory Visit: Payer: Medicare Other | Admitting: Psychology

## 2017-06-07 ENCOUNTER — Other Ambulatory Visit: Payer: Self-pay | Admitting: Family Medicine

## 2017-06-07 NOTE — Telephone Encounter (Signed)
Last OV 04/09/2017 Next OV 07/15/2017 Last refill 04/06/2017

## 2017-06-11 ENCOUNTER — Telehealth: Payer: Self-pay | Admitting: Family Medicine

## 2017-06-11 NOTE — Telephone Encounter (Signed)
Pt called and wanted to know of Dr. Caryl Bis know of any other Psychologist in the area. He feels that she was not getting anywhere with [facility]. She wants to speak with someone to help her with moving on and being able to live her life by herself. She states it is not grief, she wants to be able to move on. Please advise, thank you!  Call @ [phone]

## 2017-06-11 NOTE — Telephone Encounter (Signed)
Please advise 

## 2017-06-13 NOTE — Telephone Encounter (Signed)
There is a list of psychologists on my desk that have been recommended to me by other providers. Please provide the patient with this list. Thanks.

## 2017-06-14 NOTE — Telephone Encounter (Signed)
Emailed to patient per request

## 2017-06-14 NOTE — Telephone Encounter (Signed)
Pt called back again today and was asking why when she called to set up with Dr. Rexene Edison that they needed to ask Bambi to release pt from her care. Pt was not understanding that it is professional courtesy and that she cannot see 2 providers for the same issue. Pt kept reiterating that she likes Bambi and that she has no issue with her it is just that she feels that they have hit a road block, advised pt again that there are no hard feelings in wanting to transfer care. Pt mentioned that her appts with Bambi seem to keep getting moved and scheduled further and further in the month, and feels that she is getting pushed off. Pt called back as I was typing this message and states that she does not want to see Dr. Rexene Edison as it will be the end of November before she could get an appt with her, she also called one of the other doctors on the list and they do not accept her insurance. Pt has an appt with Bambi on Thursday at 2 pm, she is worried that her appt is going to get changed again. Advised pt that she needs to be up front and honest with Bambi that she feels as though they have hit a road block and see if they can work through this problem or if seeing another provider will help. Pt seems to be obsessing over this. I was on the phone with her for 20 minutes on Friday and a combined 15 minutes today. Please advise, thank you!

## 2017-06-17 ENCOUNTER — Ambulatory Visit (INDEPENDENT_AMBULATORY_CARE_PROVIDER_SITE_OTHER): Payer: Medicare Other | Admitting: Psychology

## 2017-06-17 ENCOUNTER — Encounter: Payer: Self-pay | Admitting: Podiatry

## 2017-06-17 ENCOUNTER — Ambulatory Visit (INDEPENDENT_AMBULATORY_CARE_PROVIDER_SITE_OTHER): Payer: Medicare Other | Admitting: Podiatry

## 2017-06-17 DIAGNOSIS — F321 Major depressive disorder, single episode, moderate: Secondary | ICD-10-CM | POA: Diagnosis not present

## 2017-06-17 DIAGNOSIS — M79609 Pain in unspecified limb: Secondary | ICD-10-CM | POA: Diagnosis not present

## 2017-06-17 DIAGNOSIS — B351 Tinea unguium: Secondary | ICD-10-CM

## 2017-06-17 DIAGNOSIS — Q828 Other specified congenital malformations of skin: Secondary | ICD-10-CM

## 2017-06-17 NOTE — Progress Notes (Signed)
Complaint:  Visit Type: Patient returns to my office for continued preventative foot care services. Complaint: Patient states" my nails have grown long and thick and become painful to walk and wear shoes"  The patient presents for preventative foot care services. No changes to ROS.  She says she has painful callus left foot. Her right toenail has healed with no evidence of infection. Podiatric Exam: Vascular: dorsalis pedis and posterior tibial pulses are palpable bilateral. Capillary return is immediate. Temperature gradient is WNL. Skin turgor WNL  Sensorium: Normal Semmes Weinstein monofilament test. Normal tactile sensation bilaterally. Nail Exam: Pt has thick disfigured discolored nails with subungual debris noted bilateral entire nail hallux through fifth toenails Ulcer Exam: There is no evidence of ulcer or pre-ulcerative changes or infection. Orthopedic Exam: Muscle tone and strength are WNL. No limitations in general ROM. No crepitus or effusions noted. Foot type and digits show no abnormalities. Bony prominences are unremarkable. Skin:  Porokeratosis sub 2 left. No infection or ulcers  Diagnosis:  Onychomycosis, , Pain in right toe, pain in left toes, Porokeratosis Treatment & Plan Procedures and Treatment: Consent by patient was obtained for treatment procedures. The patient understood the discussion of treatment and procedures well. All questions were answered thoroughly reviewed. Debridement of mycotic and hypertrophic toenails, 1 through 5 bilateral and clearing of subungual debris. No ulceration, no infection noted.  Return Visit-Office Procedure: Patient instructed to return to the office for a follow up visit 10  weeks for continued evaluation and treatment.    Gardiner Barefoot DPM

## 2017-06-21 ENCOUNTER — Ambulatory Visit (INDEPENDENT_AMBULATORY_CARE_PROVIDER_SITE_OTHER): Payer: Medicare Other | Admitting: Psychology

## 2017-06-21 DIAGNOSIS — F321 Major depressive disorder, single episode, moderate: Secondary | ICD-10-CM | POA: Diagnosis not present

## 2017-06-29 ENCOUNTER — Ambulatory Visit (INDEPENDENT_AMBULATORY_CARE_PROVIDER_SITE_OTHER): Payer: Medicare Other | Admitting: Psychology

## 2017-06-29 DIAGNOSIS — F321 Major depressive disorder, single episode, moderate: Secondary | ICD-10-CM | POA: Diagnosis not present

## 2017-07-06 ENCOUNTER — Ambulatory Visit (INDEPENDENT_AMBULATORY_CARE_PROVIDER_SITE_OTHER): Payer: Medicare Other | Admitting: Psychology

## 2017-07-06 DIAGNOSIS — F321 Major depressive disorder, single episode, moderate: Secondary | ICD-10-CM | POA: Diagnosis not present

## 2017-07-13 ENCOUNTER — Ambulatory Visit (INDEPENDENT_AMBULATORY_CARE_PROVIDER_SITE_OTHER): Payer: Medicare Other | Admitting: Psychology

## 2017-07-13 DIAGNOSIS — F321 Major depressive disorder, single episode, moderate: Secondary | ICD-10-CM | POA: Diagnosis not present

## 2017-07-15 ENCOUNTER — Ambulatory Visit (INDEPENDENT_AMBULATORY_CARE_PROVIDER_SITE_OTHER): Payer: Medicare Other | Admitting: Family Medicine

## 2017-07-15 ENCOUNTER — Encounter: Payer: Self-pay | Admitting: Family Medicine

## 2017-07-15 VITALS — BP 130/72 | HR 67 | Temp 98.0°F | Wt 187.4 lb

## 2017-07-15 DIAGNOSIS — F4321 Adjustment disorder with depressed mood: Secondary | ICD-10-CM

## 2017-07-15 DIAGNOSIS — Z634 Disappearance and death of family member: Secondary | ICD-10-CM

## 2017-07-15 DIAGNOSIS — F4329 Adjustment disorder with other symptoms: Secondary | ICD-10-CM | POA: Diagnosis not present

## 2017-07-15 DIAGNOSIS — R5383 Other fatigue: Secondary | ICD-10-CM | POA: Insufficient documentation

## 2017-07-15 DIAGNOSIS — R252 Cramp and spasm: Secondary | ICD-10-CM | POA: Diagnosis not present

## 2017-07-15 LAB — CBC
HEMATOCRIT: 39.4 % (ref 36.0–46.0)
HEMOGLOBIN: 12.7 g/dL (ref 12.0–15.0)
MCHC: 32.2 g/dL (ref 30.0–36.0)
MCV: 89.4 fl (ref 78.0–100.0)
PLATELETS: 286 10*3/uL (ref 150.0–400.0)
RBC: 4.4 Mil/uL (ref 3.87–5.11)
RDW: 13.9 % (ref 11.5–15.5)
WBC: 8.1 10*3/uL (ref 4.0–10.5)

## 2017-07-15 LAB — COMPREHENSIVE METABOLIC PANEL
ALT: 16 U/L (ref 0–35)
AST: 19 U/L (ref 0–37)
Albumin: 4.1 g/dL (ref 3.5–5.2)
Alkaline Phosphatase: 100 U/L (ref 39–117)
BUN: 21 mg/dL (ref 6–23)
CALCIUM: 9.7 mg/dL (ref 8.4–10.5)
CHLORIDE: 106 meq/L (ref 96–112)
CO2: 30 meq/L (ref 19–32)
Creatinine, Ser: 0.85 mg/dL (ref 0.40–1.20)
GFR: 69.99 mL/min (ref >60.00)
Glucose, Bld: 78 mg/dL (ref 70–99)
Potassium: 4.5 mEq/L (ref 3.5–5.1)
Sodium: 141 mEq/L (ref 135–145)
Total Bilirubin: 0.5 mg/dL (ref 0.2–1.2)
Total Protein: 7 g/dL (ref 6.0–8.3)

## 2017-07-15 LAB — TSH: TSH: 2.1 u[IU]/mL (ref 0.35–4.50)

## 2017-07-15 NOTE — Patient Instructions (Signed)
Nice to see you. We will check labs today. Discontinue the pravastatin for the next 1-2 weeks and see if your cramps improve. If they do not improve please start back on the pravastatin. If you develop chest pain or shortness of breath or worsening fatigue please be evaluated.

## 2017-07-15 NOTE — Assessment & Plan Note (Signed)
Patient has cramps. Could be inadequate liquid intake, electrolyte disturbance, or related to statin therapy. She'll hold her statin for 1-2 weeks and see if this improves. Encouraged hydration. Check lab work.

## 2017-07-15 NOTE — Assessment & Plan Note (Signed)
Continues to have issues with this though it is somewhat better. Still has some anxiety and depression. Not interested in medication other than Klonopin for this. Not taking that too often. No drowsiness with this. She'll continue to see a therapist.

## 2017-07-15 NOTE — Progress Notes (Signed)
  [name], MD Phone: [phone]  Toni Berger is a 71 y.o. female who presents today for follow-up.  Patient notes for the last month or so she's been having foot and leg cramps. Notes they occur in her toes, feet, and legs. She attributes it to the pravastatin. She did skip a dose and didn't have any issues with this. She rubs her legs and gets up and they do improve somewhat. Also has had some cramps in her right posterior neck.  Anxiety/grief: Notes her grief is better. Does note some anxiety. Also some depression. She is taking Klonopin though not that often. She's seeing a therapist. She reports she has trust issues. She has no SI or HI.  Patient reports she's been fatigued over the last month or so. She just feels tired all the time. Notes if she does certain activities she'll be fatigued and have to sit down and rest. She notes no chest pain or shortness of breath. She notes she does not wake up well rested though she has no hypersomnia.  PMH: nonsmoker.   ROS see history of present illness  Objective  Physical Exam Vitals:   07/15/17 1050  BP: 130/72  Pulse: 67  Temp: 98 F (36.7 C)  SpO2: 98%    BP Readings from Last 3 Encounters:  07/15/17 130/72  04/09/17 128/66  01/26/17 140/90   Wt Readings from Last 3 Encounters:  07/15/17 187 lb 6.4 oz (85 kg)  04/09/17 184 lb 12.8 oz (83.8 kg)  01/26/17 180 lb 3.2 oz (81.7 kg)    Physical Exam  Constitutional: No distress.  Cardiovascular: Normal rate, regular rhythm and normal heart sounds.   Pulmonary/Chest: Effort normal and breath sounds normal.  Musculoskeletal: She exhibits no edema.  No midline neck tenderness, no midline neck step-off, no muscular neck tenderness, 5/5 strength bilateral biceps, triceps, and grip, sensation light touch intact bilateral upper extremities  Neurological: She is alert. Gait normal.  Skin: Skin is warm and dry. She is not diaphoretic.   EKG sinus bradycardia, nonspecific  T-wave abnormalities stable from prior, no new changes  Assessment/Plan: Please see individual problem list.  Cramps of lower extremity Patient has cramps. Could be inadequate liquid intake, electrolyte disturbance, or related to statin therapy. She'll hold her statin for 1-2 weeks and see if this improves. Encouraged hydration. Check lab work.  Complicated grief Continues to have issues with this though it is somewhat better. Still has some anxiety and depression. Not interested in medication other than Klonopin for this. Not taking that too often. No drowsiness with this. She'll continue to see a therapist.  Fatigue Patient reports fatigue over the last month or so. Somewhat exertionally related. No chest pain or shortness of breath. EKG completed today. Reassuring. We'll complete lab work as outlined below. If unremarkable consider cardiology evaluation versus echo. Given return precautions.   Orders Placed This Encounter  Procedures  . CBC  . Comp Met (CMET)  . TSH  . EKG 12-Lead   [name], MD [place]

## 2017-07-15 NOTE — Assessment & Plan Note (Signed)
Patient reports fatigue over the last month or so. Somewhat exertionally related. No chest pain or shortness of breath. EKG completed today. Reassuring. We'll complete lab work as outlined below. If unremarkable consider cardiology evaluation versus echo. Given return precautions.

## 2017-07-16 ENCOUNTER — Telehealth: Payer: Self-pay | Admitting: Family Medicine

## 2017-07-16 ENCOUNTER — Telehealth: Payer: Self-pay

## 2017-07-16 DIAGNOSIS — T733XXA Exhaustion due to excessive exertion, initial encounter: Secondary | ICD-10-CM

## 2017-07-16 DIAGNOSIS — R5383 Other fatigue: Secondary | ICD-10-CM

## 2017-07-16 NOTE — Telephone Encounter (Signed)
Please advise, she would like to know why and what it would do

## 2017-07-16 NOTE — Telephone Encounter (Signed)
Copied from [place] [id number]. Topic: Quick Communication - See Telephone Encounter >> Jul 16, 2017  1:47 PM Robina Ade, Helene Kelp D wrote: CRM for notification. See Telephone encounter for: 07/16/17. Patient called and was returning Dr. Caryl Bis cma call Janett Billow. She wants to know about the echo evaluation and what that is. She can be reached at [phone], thanks.

## 2017-07-16 NOTE — Telephone Encounter (Unsigned)
Copied from [place] [id number]. Topic: Referral - Request >> Jul 16, 2017  5:08 PM Neva Seat wrote: Pt changed her mind and said she will do what Dr. Caryl Bis suggested for the echocardigram.  She said to go ahead with the referral.

## 2017-07-16 NOTE — Telephone Encounter (Signed)
Copied from [place] #[id number]. Topic: Referral - Request >> Jul 16, 2017  5:11 PM Neva Seat wrote: Pt changed her mind and said she will do what Dr. Caryl Bis suggested for the echocardigram.  She said to go ahead with the referral.

## 2017-07-17 NOTE — Telephone Encounter (Signed)
Please clarify with the patient. Does she want the echo and the cardiology referral? Or just one of them? Thanks.

## 2017-07-19 NOTE — Telephone Encounter (Signed)
Referral placed.

## 2017-07-19 NOTE — Telephone Encounter (Signed)
Patient is aware and would like the referral to cardiology first.

## 2017-07-19 NOTE — Telephone Encounter (Signed)
Please let the patient know that I tried to order the echo though there is a warning that Medicare may not pay for it.  It may be more cost effective for her to see cardiology and determine if she needs an echo or other evaluation.

## 2017-07-19 NOTE — Telephone Encounter (Signed)
Patient says she would like to have the echo done first and let you read it and then if needed we can refer to cardiology.

## 2017-07-20 ENCOUNTER — Telehealth: Payer: Self-pay | Admitting: *Deleted

## 2017-07-20 ENCOUNTER — Telehealth: Payer: Self-pay | Admitting: Family Medicine

## 2017-07-20 ENCOUNTER — Ambulatory Visit: Payer: Medicare Other | Admitting: Psychology

## 2017-07-20 DIAGNOSIS — T733XXA Exhaustion due to excessive exertion, initial encounter: Secondary | ICD-10-CM

## 2017-07-20 NOTE — Telephone Encounter (Signed)
Can you please call them and see what information they need? I want the echo due to exertional fatigue. She has had no dyspnea or chest pain. Thanks.

## 2017-07-20 NOTE — Telephone Encounter (Signed)
Copied from [place] #[id number]. Topic: Referral - Question >> Jul 20, 2017 12:07 PM Patrice Paradise wrote: Reason for CRM: Pt would like for Dr. Caryl Bis or his CMA Thurmond Butts to give her a call. She can not get an appt for the until Oct 11, 2017 to see Dr. Rockey Situ. She can be reached at [phone]

## 2017-07-20 NOTE — Telephone Encounter (Signed)
Copied from [place] #[id number]. Topic: Referral - Question >> Jul 20, 2017 12:07 PM Patrice Paradise wrote: Reason for CRM: Pt would like for Dr. Caryl Bis or his CMA Thurmond Butts to give her a call. She can not get an appt for the until Oct 11, 2017 to see Dr. Rockey Situ. She can be reached at [phone]

## 2017-07-20 NOTE — Telephone Encounter (Signed)
Patient states she called her insurance and they stated that if you call them and let them know why you want an echo then they will approve it. Patient states she does not need a cardiologist at this time if we can just order the echocardiogram.  Provider line:[phone]

## 2017-07-22 NOTE — Telephone Encounter (Signed)
Pt called back, clarified with Larena Glassman that medicare should covered because PCP is referring pt. Pt is wanting to be called once this is set up.

## 2017-07-23 NOTE — Telephone Encounter (Signed)
Form given to [facility]

## 2017-07-23 NOTE — Telephone Encounter (Signed)
Called pt and informed her to come by the office to sign the ABN. She said it will be Monday or Tuesday when she comes. I will schedule the echo once it has been signed.

## 2017-07-23 NOTE — Telephone Encounter (Signed)
I have placed the order though the patient needs to be given the ABN form to sign stating that she wants [place] to bill Medicare.  Once she signs this this can be set up.  Thanks.

## 2017-07-27 ENCOUNTER — Ambulatory Visit: Payer: Medicare Other | Admitting: Psychology

## 2017-07-27 ENCOUNTER — Other Ambulatory Visit: Payer: Self-pay | Admitting: Family Medicine

## 2017-07-27 DIAGNOSIS — R9431 Abnormal electrocardiogram [ECG] [EKG]: Secondary | ICD-10-CM

## 2017-07-27 DIAGNOSIS — T733XXA Exhaustion due to excessive exertion, initial encounter: Secondary | ICD-10-CM

## 2017-07-27 DIAGNOSIS — R001 Bradycardia, unspecified: Secondary | ICD-10-CM

## 2017-07-30 ENCOUNTER — Other Ambulatory Visit: Payer: Self-pay

## 2017-07-30 ENCOUNTER — Ambulatory Visit (INDEPENDENT_AMBULATORY_CARE_PROVIDER_SITE_OTHER): Payer: Medicare Other

## 2017-07-30 DIAGNOSIS — R9431 Abnormal electrocardiogram [ECG] [EKG]: Secondary | ICD-10-CM | POA: Diagnosis not present

## 2017-07-30 DIAGNOSIS — T733XXA Exhaustion due to excessive exertion, initial encounter: Secondary | ICD-10-CM | POA: Diagnosis not present

## 2017-07-30 DIAGNOSIS — R001 Bradycardia, unspecified: Secondary | ICD-10-CM | POA: Diagnosis not present

## 2017-08-03 ENCOUNTER — Telehealth: Payer: Self-pay

## 2017-08-03 ENCOUNTER — Ambulatory Visit (INDEPENDENT_AMBULATORY_CARE_PROVIDER_SITE_OTHER): Payer: Medicare Other | Admitting: Psychology

## 2017-08-03 ENCOUNTER — Encounter: Payer: Self-pay | Admitting: Family Medicine

## 2017-08-03 DIAGNOSIS — F321 Major depressive disorder, single episode, moderate: Secondary | ICD-10-CM | POA: Diagnosis not present

## 2017-08-03 NOTE — Telephone Encounter (Signed)
Patient would like to inform Dr.Sonnenberg that she stopped the pravastatin for the toe cramps but it did not make them go away completely. Patient states she is restarting the pravastatin

## 2017-08-03 NOTE — Telephone Encounter (Signed)
Copied from [place] [id number]. Topic: Quick Communication - Lab Results >> Aug 02, 2017  4:13 PM Juanda Chance, [place] wrote: Left message to return call, ok for [facility] to give results and speak to patient  >> Aug 02, 2017  4:21 PM [place], [place] A wrote: Pt called back in to speak with Janett Billow about labs Pt also had a question about her pravastatin.  She is still having the toe craps   >> Aug 02, 2017  4:43 PM Bea Graff, [place] wrote: Patient is calling back wanting to know why the office called. She is unsure of why she was called. Please call pt

## 2017-08-10 ENCOUNTER — Ambulatory Visit (INDEPENDENT_AMBULATORY_CARE_PROVIDER_SITE_OTHER): Payer: Medicare Other | Admitting: Psychology

## 2017-08-10 DIAGNOSIS — F321 Major depressive disorder, single episode, moderate: Secondary | ICD-10-CM | POA: Diagnosis not present

## 2017-08-11 NOTE — Telephone Encounter (Signed)
Noted. Thanks.

## 2017-08-12 ENCOUNTER — Other Ambulatory Visit: Payer: Self-pay | Admitting: Family Medicine

## 2017-08-17 ENCOUNTER — Ambulatory Visit (INDEPENDENT_AMBULATORY_CARE_PROVIDER_SITE_OTHER): Payer: Medicare Other | Admitting: Psychology

## 2017-08-17 ENCOUNTER — Other Ambulatory Visit: Payer: Self-pay | Admitting: Family Medicine

## 2017-08-17 ENCOUNTER — Telehealth: Payer: Self-pay

## 2017-08-17 DIAGNOSIS — F321 Major depressive disorder, single episode, moderate: Secondary | ICD-10-CM

## 2017-08-17 NOTE — Telephone Encounter (Signed)
See result note.  

## 2017-08-17 NOTE — Telephone Encounter (Signed)
Copied from [place] [id number]. Topic: General - Other >> Aug 17, 2017  1:33 PM Marin Olp L wrote: Reason for CRM: Returning call from [facility]. No notes RE why. Please leave voicemail if no answer.

## 2017-08-18 ENCOUNTER — Telehealth: Payer: Self-pay

## 2017-08-18 ENCOUNTER — Telehealth: Payer: Self-pay | Admitting: Family Medicine

## 2017-08-18 MED ORDER — CLOPIDOGREL BISULFATE 75 MG PO TABS: 75.0000 mg | ORAL_TABLET | Freq: Every day | ORAL | 3 refills | Status: DC

## 2017-08-18 MED ORDER — LISINOPRIL 40 MG PO TABS: 40.0000 mg | ORAL_TABLET | Freq: Every day | ORAL | 3 refills | Status: DC

## 2017-08-18 MED ORDER — PRAVASTATIN SODIUM 40 MG PO TABS: 40.0000 mg | ORAL_TABLET | Freq: Every day | ORAL | 3 refills | Status: DC

## 2017-08-18 MED ORDER — AMLODIPINE BESYLATE 5 MG PO TABS: 5.0000 mg | ORAL_TABLET | Freq: Every day | ORAL | 3 refills | Status: DC

## 2017-08-18 NOTE — Telephone Encounter (Signed)
Patient said her mail order Pharmacy [facility]) did not receive a request for two of her prescriptions:  1) Plavix - Clopidogrel  2) Norvasc Amlodipine-Besylate 5mg   She would like those two medications sent over ASAP.

## 2017-08-18 NOTE — Telephone Encounter (Signed)
-----   Message from Leone Haven, MD sent at 08/17/2017  5:05 PM EST ----- Ok to refill plavix 75 mg once daily #90 with 3 refills. Please find out what pharmacy she wants this sent to and send it in. Thanks.

## 2017-08-18 NOTE — Telephone Encounter (Signed)
Copied from [place] [id number]. Topic: General - Other >> Aug 18, 2017  3:11 PM Marin Olp L wrote: Reason for CRM: Patient calling to cancel refill requests on plavix, amlodipene and 2 others. Says [facility] told her they have shipped them to her. They are processing lisinopril and pravastatin.

## 2017-08-18 NOTE — Telephone Encounter (Signed)
noted 

## 2017-08-23 ENCOUNTER — Other Ambulatory Visit: Payer: Self-pay | Admitting: Family Medicine

## 2017-08-24 ENCOUNTER — Ambulatory Visit: Payer: Medicare Other | Admitting: Psychology

## 2017-08-26 ENCOUNTER — Ambulatory Visit: Payer: Medicare Other | Admitting: Podiatry

## 2017-08-26 ENCOUNTER — Other Ambulatory Visit: Payer: Self-pay

## 2017-08-26 MED ORDER — CLONAZEPAM 1 MG PO TABS: ORAL_TABLET | ORAL | 1 refills | Status: DC

## 2017-08-26 NOTE — Telephone Encounter (Signed)
Last OV 07/15/17 last filled 06/08/17 90 1rf

## 2017-08-26 NOTE — Telephone Encounter (Signed)
Will fax once signed.

## 2017-08-30 ENCOUNTER — Encounter: Payer: Self-pay | Admitting: Family Medicine

## 2017-08-31 ENCOUNTER — Ambulatory Visit (INDEPENDENT_AMBULATORY_CARE_PROVIDER_SITE_OTHER): Payer: Medicare Other | Admitting: Psychology

## 2017-08-31 DIAGNOSIS — F321 Major depressive disorder, single episode, moderate: Secondary | ICD-10-CM

## 2017-08-31 NOTE — Telephone Encounter (Signed)
Copied from [place] [id number]. Topic: Quick Communication - See Telephone Encounter >> Aug 31, 2017  2:45 PM Percell Belt A wrote: CRM for notification. See Telephone encounter for: pt called in and wanted the DR to know that she is going to start the  Pravastatin back today until she see the DR .  This is just fyi   08/31/17.

## 2017-09-02 ENCOUNTER — Ambulatory Visit (INDEPENDENT_AMBULATORY_CARE_PROVIDER_SITE_OTHER): Payer: Medicare Other | Admitting: Podiatry

## 2017-09-02 ENCOUNTER — Encounter: Payer: Self-pay | Admitting: Podiatry

## 2017-09-02 DIAGNOSIS — M79609 Pain in unspecified limb: Secondary | ICD-10-CM | POA: Diagnosis not present

## 2017-09-02 DIAGNOSIS — D689 Coagulation defect, unspecified: Secondary | ICD-10-CM

## 2017-09-02 DIAGNOSIS — B351 Tinea unguium: Secondary | ICD-10-CM

## 2017-09-02 DIAGNOSIS — Q828 Other specified congenital malformations of skin: Secondary | ICD-10-CM | POA: Diagnosis not present

## 2017-09-02 NOTE — Progress Notes (Signed)
Complaint:  Visit Type: Patient returns to my office for continued preventative foot care services. Complaint: Patient states" my nails have grown long and thick and become painful to walk and wear shoes"  The patient presents for preventative foot care services. No changes to ROS.  She says she has painful callus left foot. Her right toenail has healed with no evidence of infection.  Patient is taking plavix. Podiatric Exam: Vascular: dorsalis pedis and posterior tibial pulses are palpable bilateral. Capillary return is immediate. Temperature gradient is WNL. Skin turgor WNL  Sensorium: Normal Semmes Weinstein monofilament test. Normal tactile sensation bilaterally. Nail Exam: Pt has thick disfigured discolored nails with subungual debris noted bilateral entire nail hallux through fifth toenails Ulcer Exam: There is no evidence of ulcer or pre-ulcerative changes or infection. Orthopedic Exam: Muscle tone and strength are WNL. No limitations in general ROM. No crepitus or effusions noted. Foot type and digits show no abnormalities. Bony prominences are unremarkable. Skin:  Porokeratosis sub 2 left. No infection or ulcers  Diagnosis:  Onychomycosis, , Pain in right toe, pain in left toes, Porokeratosis Treatment & Plan Procedures and Treatment: Consent by patient was obtained for treatment procedures. The patient understood the discussion of treatment and procedures well. All questions were answered thoroughly reviewed. Debridement of mycotic and hypertrophic toenails, 1 through 5 bilateral and clearing of subungual debris. No ulceration, no infection noted.  Return Visit-Office Procedure: Patient instructed to return to the office for a follow up visit 10  weeks for continued evaluation and treatment.    Gardiner Barefoot DPM

## 2017-09-09 ENCOUNTER — Ambulatory Visit (INDEPENDENT_AMBULATORY_CARE_PROVIDER_SITE_OTHER): Payer: Medicare Other | Admitting: Psychology

## 2017-09-09 DIAGNOSIS — F321 Major depressive disorder, single episode, moderate: Secondary | ICD-10-CM | POA: Diagnosis not present

## 2017-09-21 ENCOUNTER — Ambulatory Visit (INDEPENDENT_AMBULATORY_CARE_PROVIDER_SITE_OTHER): Payer: Medicare Other | Admitting: Psychology

## 2017-09-21 DIAGNOSIS — F321 Major depressive disorder, single episode, moderate: Secondary | ICD-10-CM | POA: Diagnosis not present

## 2017-09-27 ENCOUNTER — Ambulatory Visit: Payer: Self-pay | Admitting: *Deleted

## 2017-09-27 ENCOUNTER — Telehealth: Payer: Self-pay

## 2017-09-27 DIAGNOSIS — H18831 Recurrent erosion of cornea, right eye: Secondary | ICD-10-CM | POA: Diagnosis not present

## 2017-09-27 NOTE — Telephone Encounter (Signed)
Copied from [place] [id number]. Topic: Inquiry >> Sep 27, 2017 11:21 AM Conception Chancy, NT wrote: Reason for CRM: there is a triage note documented about being seen for eye pain either today or tomorrow. She is calling to let office know she is being seen today 09/27/17 by her eye doctor at 1:15pm

## 2017-09-27 NOTE — Telephone Encounter (Signed)
Noted  

## 2017-09-27 NOTE — Telephone Encounter (Signed)
Spoken to patient. She is going to go to Eye center to be evaluated since we have no available appointments.  Patient stated she feels ok to drive and vision is not impaired due to [place].

## 2017-09-27 NOTE — Telephone Encounter (Signed)
fyi

## 2017-09-27 NOTE — Telephone Encounter (Addendum)
Pt states that her right eye feels like something is in it; she placed a warm compress and "blink tears medium"; pt describes it like if she moves her eye around or blinks it feels like something in it; per nurse protocol recommendation is made for pt to see physician within 24 hours; pt refuses to any provider except Dr Caryl Bis; she also refuses ED,urgent care, urgent care and does not want to see her eye doctor; pt states she will call back if her symptoms worsen; will route this to [facility] for notification and assistance; please call pt.at [phone] if she can be worked in; she also has a 1330 appointment on 09/28/17 with counselor in your office and would like an appointment at near that time or today with Dr Caryl Bis.  Reason for Disposition . Eye pain present > 24 hours  Answer Assessment - Initial Assessment Questions 1. ONSET: "When did the pain start?" (e.g., minutes, hours, days)     hours 2. TIMING: "Does the pain come and go, or has it been constant since it started?" (e.g., constant, intermittent, fleeting)     constant 3. SEVERITY: "How bad is the pain?"   (Scale 1-10; mild, moderate or severe)   - MILD (1-3): doesn't interfere with normal activities    - MODERATE (4-7): interferes with normal activities or awakens from sleep    - SEVERE (8-10): excruciating pain and patient unable to do normal activities     moderate 4. LOCATION: "Where does it hurt?"  (e.g., eyelid, eye, cheekbone)     Eye when she opens it 5. CAUSE: "What do you think is causing the pain?"     unknown 6. VISION: "Do you have blurred vision or changes in your vision?"      no 7. EYE DISCHARGE: "Is there any discharge (pus) from the eye(s)?"  If yes, ask: "What color is it?"      Clear Watery  8. FEVER: "Do you have a fever?" If so, ask: "What is it, how was it measured, and when did it start?"      no 9. OTHER SYMPTOMS: "Do you have any other symptoms?" (e.g., headache, nasal discharge, facial  rash)     Runny nose 10. PREGNANCY: "Is there any chance you are pregnant?" "When was your last menstrual period?"       No hysterectomy  Protocols used: EYE PAIN-A-AH

## 2017-09-28 ENCOUNTER — Ambulatory Visit (INDEPENDENT_AMBULATORY_CARE_PROVIDER_SITE_OTHER): Payer: Medicare Other | Admitting: Psychology

## 2017-09-28 ENCOUNTER — Ambulatory Visit: Payer: Medicare Other | Admitting: Psychology

## 2017-09-28 DIAGNOSIS — F321 Major depressive disorder, single episode, moderate: Secondary | ICD-10-CM | POA: Diagnosis not present

## 2017-10-05 ENCOUNTER — Ambulatory Visit (INDEPENDENT_AMBULATORY_CARE_PROVIDER_SITE_OTHER): Payer: Medicare Other | Admitting: Psychology

## 2017-10-05 ENCOUNTER — Ambulatory Visit: Payer: Medicare Other | Admitting: Psychology

## 2017-10-05 DIAGNOSIS — F321 Major depressive disorder, single episode, moderate: Secondary | ICD-10-CM

## 2017-10-12 ENCOUNTER — Ambulatory Visit (INDEPENDENT_AMBULATORY_CARE_PROVIDER_SITE_OTHER): Payer: Medicare Other | Admitting: Psychology

## 2017-10-12 ENCOUNTER — Ambulatory Visit: Payer: Medicare Other | Admitting: Psychology

## 2017-10-12 DIAGNOSIS — F321 Major depressive disorder, single episode, moderate: Secondary | ICD-10-CM

## 2017-10-15 ENCOUNTER — Ambulatory Visit (INDEPENDENT_AMBULATORY_CARE_PROVIDER_SITE_OTHER): Payer: Medicare Other | Admitting: Family Medicine

## 2017-10-15 ENCOUNTER — Encounter: Payer: Self-pay | Admitting: Family Medicine

## 2017-10-15 VITALS — BP 130/70 | HR 75 | Temp 98.0°F | Resp 17 | Ht 67.0 in | Wt 192.0 lb

## 2017-10-15 DIAGNOSIS — F4321 Adjustment disorder with depressed mood: Secondary | ICD-10-CM

## 2017-10-15 DIAGNOSIS — E785 Hyperlipidemia, unspecified: Secondary | ICD-10-CM

## 2017-10-15 DIAGNOSIS — F4329 Adjustment disorder with other symptoms: Secondary | ICD-10-CM | POA: Diagnosis not present

## 2017-10-15 DIAGNOSIS — I10 Essential (primary) hypertension: Secondary | ICD-10-CM | POA: Diagnosis not present

## 2017-10-15 DIAGNOSIS — Z634 Disappearance and death of family member: Secondary | ICD-10-CM

## 2017-10-15 DIAGNOSIS — R062 Wheezing: Secondary | ICD-10-CM | POA: Insufficient documentation

## 2017-10-15 DIAGNOSIS — R5383 Other fatigue: Secondary | ICD-10-CM

## 2017-10-15 LAB — COMPREHENSIVE METABOLIC PANEL
ALT: 17 U/L (ref 0–35)
AST: 18 U/L (ref 0–37)
Albumin: 4.2 g/dL (ref 3.5–5.2)
Alkaline Phosphatase: 114 U/L (ref 39–117)
BUN: 22 mg/dL (ref 6–23)
CALCIUM: 9.4 mg/dL (ref 8.4–10.5)
CHLORIDE: 105 meq/L (ref 96–112)
CO2: 28 meq/L (ref 19–32)
Creatinine, Ser: 0.8 mg/dL (ref 0.40–1.20)
GFR: 75.01 mL/min (ref >60.00)
Glucose, Bld: 100 mg/dL — ABNORMAL HIGH (ref 70–99)
Potassium: 4.1 mEq/L (ref 3.5–5.1)
Sodium: 138 mEq/L (ref 135–145)
Total Bilirubin: 0.4 mg/dL (ref 0.2–1.2)
Total Protein: 7.3 g/dL (ref 6.0–8.3)

## 2017-10-15 LAB — LDL CHOLESTEROL, DIRECT: Direct LDL: 93 mg/dL

## 2017-10-15 MED ORDER — CLONAZEPAM 1 MG PO TABS: ORAL_TABLET | ORAL | 1 refills | Status: DC

## 2017-10-15 NOTE — Patient Instructions (Addendum)
Nice to see you. We will check some lab work today and contact you with the results. We will fax your Klonopin to the pharmacy.

## 2017-10-15 NOTE — Assessment & Plan Note (Signed)
Possible wheezing that she reports.  Benign exam.  No other symptoms.  Unsure if this is actually wheezing.  Discussed doing PFTs versus monitoring.  Patient opted to monitor.  She will let us know if she has any further issues.

## 2017-10-15 NOTE — Assessment & Plan Note (Signed)
She is back on pravastatin.  We will check labs today.

## 2017-10-15 NOTE — Progress Notes (Signed)
  [name], MD Phone: [phone]  Toni Berger is a 72 y.o. female who presents today for follow-up.  Anxiety/depression: Does note some depression and anxiety.  This is really unchanged from prior.  She is seeing a new therapist in our office.  Takes Klonopin 3 times daily.  No SI.  Has episodes where she just feels frustrated and screams.  Hypertension: Not checking at home.  She is taking amlodipine and lisinopril.  No chest pain or shortness of breath.  Patient notes at times she feels like she hears herself wheeze when she is lying down or sitting still.  No exertional symptoms.  No cough.  No shortness of breath.  She did smoke a pack per day though quit 25 years ago.  Hyperlipidemia: She is back on pravastatin.  No right upper quadrant pain or myalgias.  I discussed health maintenance with the patient.  She declines colon cancer screening and mammography.  She notes she is status post hysterectomy for noncancerous reason.  She did discuss flu vaccination, pneumonia vaccination, and tetanus vaccination.  Social History   Tobacco Use  Smoking Status Never Smoker  Smokeless Tobacco Never Used     ROS see history of present illness  Objective  Physical Exam Vitals:   10/15/17 1308  BP: 130/70  Pulse: 75  Resp: 17  Temp: 98 F (36.7 C)  SpO2: 97%    BP Readings from Last 3 Encounters:  10/15/17 130/70  07/15/17 130/72  04/09/17 128/66   Wt Readings from Last 3 Encounters:  10/15/17 192 lb (87.1 kg)  07/15/17 187 lb 6.4 oz (85 kg)  04/09/17 184 lb 12.8 oz (83.8 kg)    Physical Exam  Constitutional: No distress.  Cardiovascular: Normal rate, regular rhythm and normal heart sounds.  Pulmonary/Chest: Effort normal and breath sounds normal.  Musculoskeletal: She exhibits no edema.  Neurological: She is alert. Gait normal.  Skin: Skin is warm and dry. She is not diaphoretic.     Assessment/Plan: Please see individual problem list.  HYPERTENSION,  BENIGN Well-controlled.  Continue current regimen.  Check lab work.  Hyperlipidemia She is back on pravastatin.  We will check labs today.  Fatigue Does still note some tiredness at times.  Echo previously was reassuring previously.  Potentially could be depression related.  Could be cardiac related.  I discussed this with the patient.  She declined further evaluation at this time and will monitor.  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  She declines antidepressant.  She will continue to see the therapist.  Continue Klonopin as prescribed.  Wheezing Possible wheezing that she reports.  Benign exam.  No other symptoms.  Unsure if this is actually wheezing.  Discussed doing PFTs versus monitoring.  Patient opted to monitor.  She will let us know if she has any further issues.   Orders Placed This Encounter  Procedures  . Direct LDL  . Comp Met (CMET)    Meds ordered this encounter  Medications  . clonazePAM (KLONOPIN) 1 MG tablet    Sig: TAKE 1 TABLET BY MOUTH EVERY 8-12 HOURS AS NEEDED FOR ANXIETY (MAX 3 DOSES IN 24 HOURS)    Dispense:  90 tablet    Refill:  1    Not to exceed 4 additional fills before 10/04/2017     [name], MD [place]

## 2017-10-15 NOTE — Assessment & Plan Note (Signed)
Continues to have issues with this.  She declines antidepressant.  She will continue to see the therapist.  Continue Klonopin as prescribed.

## 2017-10-15 NOTE — Assessment & Plan Note (Signed)
Does still note some tiredness at times.  Echo previously was reassuring previously.  Potentially could be depression related.  Could be cardiac related.  I discussed this with the patient.  She declined further evaluation at this time and will monitor.

## 2017-10-15 NOTE — Assessment & Plan Note (Signed)
Well-controlled.  Continue current regimen.  Check lab work. 

## 2017-10-18 ENCOUNTER — Encounter: Payer: Self-pay | Admitting: Family Medicine

## 2017-10-19 ENCOUNTER — Ambulatory Visit (INDEPENDENT_AMBULATORY_CARE_PROVIDER_SITE_OTHER): Payer: Medicare Other | Admitting: Psychology

## 2017-10-19 ENCOUNTER — Other Ambulatory Visit: Payer: Self-pay | Admitting: Family Medicine

## 2017-10-19 DIAGNOSIS — F321 Major depressive disorder, single episode, moderate: Secondary | ICD-10-CM | POA: Diagnosis not present

## 2017-10-19 DIAGNOSIS — E785 Hyperlipidemia, unspecified: Secondary | ICD-10-CM

## 2017-10-27 ENCOUNTER — Ambulatory Visit: Payer: Medicare Other | Admitting: Psychology

## 2017-11-10 ENCOUNTER — Ambulatory Visit: Payer: Medicare Other | Admitting: Psychology

## 2017-11-10 ENCOUNTER — Ambulatory Visit (INDEPENDENT_AMBULATORY_CARE_PROVIDER_SITE_OTHER): Payer: Medicare Other | Admitting: Psychology

## 2017-11-10 DIAGNOSIS — F321 Major depressive disorder, single episode, moderate: Secondary | ICD-10-CM

## 2017-11-11 ENCOUNTER — Ambulatory Visit: Payer: Medicare Other | Admitting: Podiatry

## 2017-11-12 ENCOUNTER — Ambulatory Visit: Payer: Medicare Other | Admitting: Psychology

## 2017-11-15 ENCOUNTER — Ambulatory Visit: Payer: Medicare Other | Admitting: Podiatry

## 2017-11-16 ENCOUNTER — Encounter: Payer: Self-pay | Admitting: Family Medicine

## 2017-11-16 ENCOUNTER — Ambulatory Visit: Payer: Medicare Other | Admitting: Psychology

## 2017-11-16 DIAGNOSIS — Z1239 Encounter for other screening for malignant neoplasm of breast: Secondary | ICD-10-CM

## 2017-11-17 NOTE — Telephone Encounter (Signed)
Scheduled and patient notified.

## 2017-11-19 ENCOUNTER — Other Ambulatory Visit (INDEPENDENT_AMBULATORY_CARE_PROVIDER_SITE_OTHER): Payer: Medicare Other

## 2017-11-19 DIAGNOSIS — E785 Hyperlipidemia, unspecified: Secondary | ICD-10-CM

## 2017-11-19 LAB — HEPATIC FUNCTION PANEL
ALBUMIN: 4.1 g/dL (ref 3.5–5.2)
ALT: 16 U/L (ref 0–35)
AST: 17 U/L (ref 0–37)
Alkaline Phosphatase: 103 U/L (ref 39–117)
Bilirubin, Direct: 0.1 mg/dL (ref 0.0–0.3)
TOTAL PROTEIN: 7.3 g/dL (ref 6.0–8.3)
Total Bilirubin: 0.6 mg/dL (ref 0.2–1.2)

## 2017-11-19 LAB — LDL CHOLESTEROL, DIRECT: LDL DIRECT: 91 mg/dL

## 2017-11-23 ENCOUNTER — Encounter: Payer: Self-pay | Admitting: Family Medicine

## 2017-11-25 ENCOUNTER — Ambulatory Visit (INDEPENDENT_AMBULATORY_CARE_PROVIDER_SITE_OTHER): Payer: Medicare Other | Admitting: Podiatry

## 2017-11-25 ENCOUNTER — Encounter: Payer: Self-pay | Admitting: Podiatry

## 2017-11-25 DIAGNOSIS — D689 Coagulation defect, unspecified: Secondary | ICD-10-CM | POA: Diagnosis not present

## 2017-11-25 DIAGNOSIS — B351 Tinea unguium: Secondary | ICD-10-CM

## 2017-11-25 DIAGNOSIS — M79609 Pain in unspecified limb: Secondary | ICD-10-CM

## 2017-11-25 DIAGNOSIS — Q828 Other specified congenital malformations of skin: Secondary | ICD-10-CM | POA: Diagnosis not present

## 2017-11-25 NOTE — Progress Notes (Addendum)
Complaint:  Visit Type: Patient returns to my office for continued preventative foot care services. Complaint: Patient states" my nails have grown long and thick and become painful to walk and wear shoes"  The patient presents for preventative foot care services. No changes to ROS.  She says she has painful callus left foot. Her right toenail has healed with no evidence of infection.  Patient is taking plavix. Podiatric Exam: Vascular: dorsalis pedis and posterior tibial pulses are palpable bilateral. Capillary return is immediate. Temperature gradient is WNL. Skin turgor WNL  Sensorium: Normal Semmes Weinstein monofilament test. Normal tactile sensation bilaterally. Nail Exam: Pt has thick disfigured discolored nails with subungual debris noted bilateral entire nail hallux through fifth toenails Ulcer Exam: There is no evidence of ulcer or pre-ulcerative changes or infection. Orthopedic Exam: Muscle tone and strength are WNL. No limitations in general ROM. No crepitus or effusions noted. Foot type and digits show no abnormalities. Bony prominences are unremarkable. Skin:  Porokeratosis sub 2 left. No infection or ulcers  Diagnosis:  Onychomycosis, , Pain in right toe, pain in left toes, Porokeratosis Treatment & Plan Procedures and Treatment: Consent by patient was obtained for treatment procedures. The patient understood the discussion of treatment and procedures well. All questions were answered thoroughly reviewed. Debridement of mycotic and hypertrophic toenails, 1 through 5 bilateral and clearing of subungual debris. No ulceration, no infection noted. Debride porokeratosis.  ABN signed for 2019. Return Visit-Office Procedure: Patient instructed to return to the office for a follow up visit 10  weeks for continued evaluation and treatment.    Micaila Ziemba DPM 

## 2017-11-29 ENCOUNTER — Ambulatory Visit: Payer: Medicare Other | Admitting: Podiatry

## 2017-12-01 ENCOUNTER — Ambulatory Visit
Admission: RE | Admit: 2017-12-01 | Discharge: 2017-12-01 | Disposition: A | Discharge status: Home / Self Care | Payer: Medicare Other | Source: Ambulatory Visit | Attending: Family Medicine | Admitting: Family Medicine

## 2017-12-01 DIAGNOSIS — Z1231 Encounter for screening mammogram for malignant neoplasm of breast: Secondary | ICD-10-CM | POA: Insufficient documentation

## 2017-12-01 DIAGNOSIS — Z1239 Encounter for other screening for malignant neoplasm of breast: Secondary | ICD-10-CM

## 2017-12-06 ENCOUNTER — Telehealth: Payer: Self-pay | Admitting: Family Medicine

## 2017-12-06 NOTE — Telephone Encounter (Signed)
Informed patient we do not have results yet but will call once we receive them.

## 2017-12-06 NOTE — Telephone Encounter (Signed)
Copied from [place]. Topic: General - Other >> Dec 06, 2017 12:21 PM Yvette Rack wrote: Reason for CRM: patient calling for mammogram results

## 2017-12-06 NOTE — Telephone Encounter (Signed)
Please contact [facility] to see when they will have the results.  Thanks.

## 2017-12-07 NOTE — Telephone Encounter (Signed)
Waiting on priors from [facility]

## 2017-12-14 ENCOUNTER — Ambulatory Visit: Payer: Medicare Other | Admitting: Psychology

## 2017-12-23 ENCOUNTER — Ambulatory Visit: Payer: Medicare Other | Admitting: Podiatry

## 2017-12-24 ENCOUNTER — Other Ambulatory Visit: Payer: Self-pay | Admitting: Family Medicine

## 2017-12-24 NOTE — Telephone Encounter (Signed)
Last OV 10/15/17 and last refill for 90 with one refill 10/15/17.

## 2017-12-28 ENCOUNTER — Ambulatory Visit (INDEPENDENT_AMBULATORY_CARE_PROVIDER_SITE_OTHER): Payer: Medicare Other | Admitting: Psychology

## 2017-12-28 DIAGNOSIS — F321 Major depressive disorder, single episode, moderate: Secondary | ICD-10-CM

## 2018-01-11 ENCOUNTER — Ambulatory Visit (INDEPENDENT_AMBULATORY_CARE_PROVIDER_SITE_OTHER): Payer: Medicare Other | Admitting: Psychology

## 2018-01-11 DIAGNOSIS — F321 Major depressive disorder, single episode, moderate: Secondary | ICD-10-CM | POA: Diagnosis not present

## 2018-01-17 ENCOUNTER — Telehealth: Payer: Self-pay | Admitting: *Deleted

## 2018-01-17 NOTE — Telephone Encounter (Signed)
FYI

## 2018-01-17 NOTE — Telephone Encounter (Signed)
Called and notified patient that Dr. Caryl Bis does not have availability today. Patient states that she is taking a cough suppresent and it is helping but if she does is not feeling better later this week she will call to schedule an appointment, I offered her an appointment with another provider but she states that she only want to see Dr. Caryl Bis.

## 2018-01-17 NOTE — Telephone Encounter (Signed)
Copied from [place] [id number]. Topic: Appointment Scheduling - Scheduling Inquiry for Clinic >> Jan 17, 2018  9:40 AM Synthia Innocent wrote: Reason for CRM:  Requesting to  be seen today by Dr Caryl Bis, for cough. Please advise

## 2018-01-17 NOTE — Telephone Encounter (Signed)
Noted.  If she worsens she needs to be seen earlier.  Thanks.

## 2018-01-17 NOTE — Telephone Encounter (Signed)
fyi

## 2018-01-17 NOTE — Telephone Encounter (Signed)
Pt called back and scheduled with Delano Metz on Thursday. She is not happy not being able to see Dr. Caryl Bis.

## 2018-01-17 NOTE — Telephone Encounter (Signed)
Please call patient and inform we do not have openings, please offer another provider, office or walk in

## 2018-01-20 ENCOUNTER — Ambulatory Visit (INDEPENDENT_AMBULATORY_CARE_PROVIDER_SITE_OTHER): Payer: Medicare Other | Admitting: Family Medicine

## 2018-01-20 ENCOUNTER — Encounter: Payer: Self-pay | Admitting: Family Medicine

## 2018-01-20 VITALS — BP 130/82 | HR 58 | Temp 98.0°F | Resp 15 | Wt 192.5 lb

## 2018-01-20 DIAGNOSIS — J209 Acute bronchitis, unspecified: Secondary | ICD-10-CM | POA: Diagnosis not present

## 2018-01-20 MED ORDER — AZITHROMYCIN 250 MG PO TABS: ORAL_TABLET | ORAL | 0 refills | Status: DC

## 2018-01-20 NOTE — Progress Notes (Signed)
Patient ID: EUDELL JULIAN, female   DOB: 05-10-1946, 72 y.o.   MRN: [id number]  PCP: Leone Haven, MD  Subjective:  Toni Berger is a 72 y.o. year old very pleasant female patient who presents with  symptoms including  cough that has been minimally productive of green sputum, chest congestion, chills, and decreased energy level - Reports she has a wheeze intermittently and this increases her anxiety.  History of anxiety where she has received counseling and is currently taking Klonopin. -started: 7 days , symptoms are not worsening but not resolving -previous treatments: Robitussin has provided limited benefit -sick contacts/travel/risks: denies flu exposure. She denies recent sick contact exposure. No history of asthma/COPD, or GERD  She drinks water, juice, and coffee stating she is trying to remain "hydrated"  ROS-denies fever, NVD, tooth pain. Denies significant shortness of breath.   Pertinent Past Medical History-  Patient Active Problem List   Diagnosis Date Noted  . Wheezing 10/15/2017  . Cramps of lower extremity 07/15/2017  . Fatigue 07/15/2017  . Decreased pedal pulses 07/30/2016  . Burning pain 06/05/2016  . Complicated grief 73/22/0254  . Ankle edema 03/23/2016  . Hyperlipidemia 01/09/2016  . Closed rib fracture 01/09/2016  . Remote lacunar infarction ([place]) 12/15/2015  . Loss of consciousness ([place]) 12/15/2015  . Rotator cuff impingement syndrome 12/15/2015  . Fall in home 10/29/2015  . Cerebrovascular accident, old 10/29/2015  . HYPERTENSION, BENIGN 11/12/2010  . OSTEOARTHRITIS 08/13/2010  . 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 Anxiety 02/09/2008  . RESTLESS LEG SYNDROME 02/09/2008  . ALLERGIC RHINITIS 02/09/2008    Medications- reviewed  Current Outpatient Medications  Medication Sig Dispense Refill  . amLODipine (NORVASC) 5 MG tablet Take 1 tablet (5 mg total) by mouth daily. 90 tablet 3  . clonazePAM (KLONOPIN) 1 MG tablet TAKE 1 TABLET BY MOUTH  EVERY 8 TO 12 HOURS AS NEEDED FOR ANXIETY (MAX 3 DOSES IN 24 HOURS) 90 tablet 1  . clopidogrel (PLAVIX) 75 MG tablet Take 1 tablet (75 mg total) by mouth daily. 90 tablet 3  . ibuprofen (ADVIL) 200 MG tablet Take 200 mg by mouth every 6 (six) hours as needed.      Marland Kitchen lisinopril (PRINIVIL,ZESTRIL) 40 MG tablet Take 1 tablet (40 mg total) by mouth daily. 90 tablet 3  . pravastatin (PRAVACHOL) 40 MG tablet Take 1 tablet (40 mg total) by mouth daily. 90 tablet 3   No current facility-administered medications for this visit.     Objective: BP 130/82 (BP Location: Left Arm, Patient Position: Sitting, Cuff Size: Normal)   Pulse (!) 58   Temp 98 F (36.7 C) (Oral)   Resp 15   Wt 192 lb 8 oz (87.3 kg)   SpO2 98%   BMI 30.15 kg/m  Gen: NAD, resting comfortably HEENT: Turbinates mildly erythematous, TMs normal bilaterally, oropharynx is clear and moist, no sinus tenderness CV: RRR no murmurs rubs or gallops Lungs: CTAB no crackles, wheeze, rhonchi  Ext: no edema Lymph: No cervical lymphadenopathy Skin: warm, dry, no rash  Assessment/Plan: 1. Acute bronchitis, unspecified organism Exam is reassuring; symptoms are not worsening but are not resolving; We reviewed that her symptoms are most likely viral in nature and are most consistent with bronchitis. Lungs CTA and VSS. Reviewed possible treatment options with a Span prednisone taper for reported wheezing at home however she declined prednisone stating she "does not like prednisone". We discussed conservative treatment and I provided her with a  prescription for azithromycin to take  if not improved in the next 2-3 days. Advised that if her symptoms worsen or do not improve with treatment, she will follow up for further evaluation and treatment. She would like to continue Robitussin for cough at this time and declined prescription for benzonatate. Return precautions provided.  - azithromycin (ZITHROMAX) 250 MG tablet; Take 2 tablets by mouth at  once today, then one tablet daily x 4 days.  Dispense: 6 tablet; Refill: 0   We discussed that we did not find any infection that had higher probability of being bacterial such as pneumonia, strep throat, ear infection, bacterial sinusitis. We discussed signs that bacterial infection may have developed particularly fever or shortness of breath and reasons for follow up (symptoms worsen, last past expected time frame, new concerns arise).  Likely course of 3-6 weeks. Patient is contagious and advised good handwashing and consideration of mask If going to be in public places.    Laurita Quint, FNP

## 2018-01-20 NOTE — Patient Instructions (Addendum)
Your symptoms are most likely related to a viral illness. Please drink plenty of water so that your urine is pale yellow or clear. Also, get plenty of rest, use tylenol as needed for discomfort and follow up if symptoms do not improve in 3 to 4 days, worsen, or you develop a fever >101.   Acute Bronchitis, Adult Acute bronchitis is when air tubes (bronchi) in the lungs suddenly get swollen. The condition can make it hard to breathe. It can also cause these symptoms:  A cough.  Coughing up clear, yellow, or green mucus.  Wheezing.  Chest congestion.  Shortness of breath.  A fever.  Body aches.  Chills.  A sore throat.  Follow these instructions at home: Medicines  Take over-the-counter and prescription medicines only as told by your doctor.  If you were prescribed an antibiotic medicine, take it as told by your doctor. Do not stop taking the antibiotic even if you start to feel better. General instructions  Rest.  Drink enough fluids to keep your pee (urine) clear or pale yellow.  Avoid smoking and secondhand smoke. If you smoke and you need help quitting, ask your doctor. Quitting will help your lungs heal faster.  Use an inhaler, cool mist vaporizer, or humidifier as told by your doctor.  Keep all follow-up visits as told by your doctor. This is important. How is this prevented? To lower your risk of getting this condition again:  Wash your hands often with soap and water. If you cannot use soap and water, use hand sanitizer.  Avoid contact with people who have cold symptoms.  Try not to touch your hands to your mouth, nose, or eyes.  Make sure to get the flu shot every year.  Contact a doctor if:  Your symptoms do not get better in 2 weeks. Get help right away if:  You cough up blood.  You have chest pain.  You have very bad shortness of breath.  You become dehydrated.  You faint (pass out) or keep feeling like you are going to pass out.  You keep  throwing up (vomiting).  You have a very bad headache.  Your fever or chills gets worse. This information is not intended to replace advice given to you by your health care provider. Make sure you discuss any questions you have with your health care provider. Document Released: 02/17/2008 Document Revised: 04/08/2016 Document Reviewed: 02/19/2016 Elsevier Interactive Patient Education  [employer].

## 2018-01-25 ENCOUNTER — Encounter: Payer: Self-pay | Admitting: Family Medicine

## 2018-02-01 ENCOUNTER — Ambulatory Visit: Payer: Medicare Other | Admitting: Psychology

## 2018-02-02 ENCOUNTER — Encounter: Payer: Self-pay | Admitting: Family Medicine

## 2018-02-04 ENCOUNTER — Telehealth: Payer: Self-pay | Admitting: Family Medicine

## 2018-02-04 NOTE — Telephone Encounter (Signed)
Attempted to call patient to schedule AWV. Patient did not answer. Will try to call patient again at a later time. SF °

## 2018-02-05 IMAGING — CT CT HEAD W/O CM
1 series · 15 of 30 positions shown, 19 images · non-contrast
Comparison: 11/11/2010

CLINICAL DATA: Left-sided headaches. Right upper extremity
weakness. Loss of sense of taste. Fall striking head 1 month ago
while at home. Confusion.

EXAM:
CT HEAD WITHOUT CONTRAST
TECHNIQUE: Contiguous axial images were obtained from the base of the skull
through the vertex without intravenous contrast.

[Series 2: head wo · axial · 0.43mm/px · z∈[-15,+129]mm · 15 of 36 slices shown, 19 images]
[im 2/36  brain]
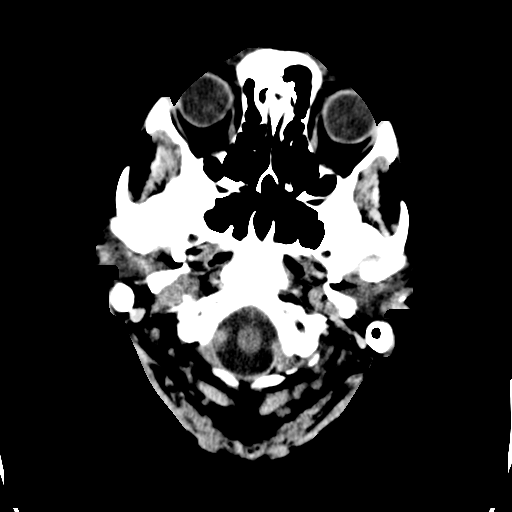
[im 2/36  bone]
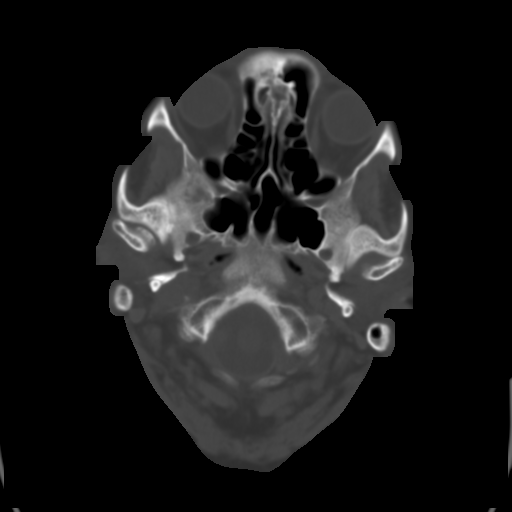
[im 4/36  brain]
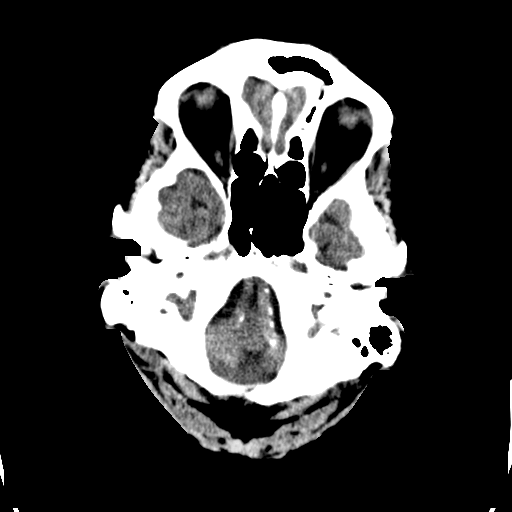
[im 7/36  brain]
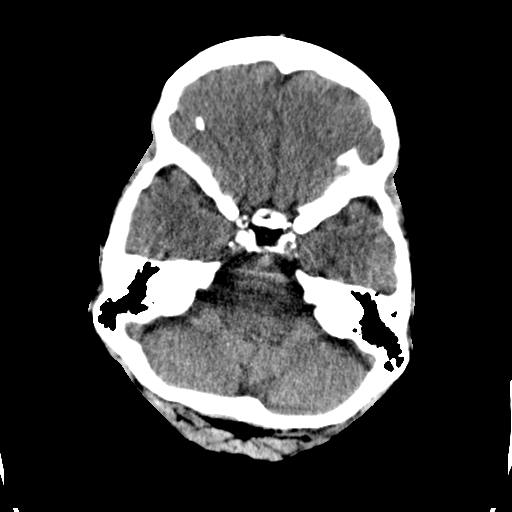
[im 9/36  brain]
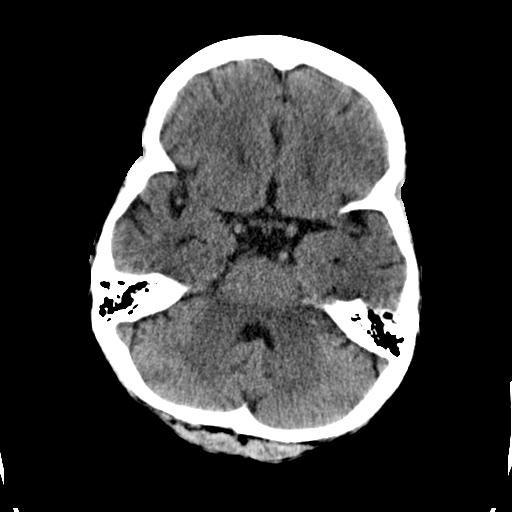
[im 11/36  brain]
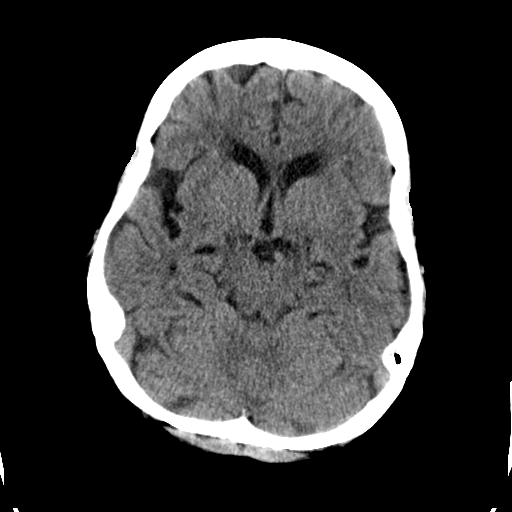
[im 11/36  bone]
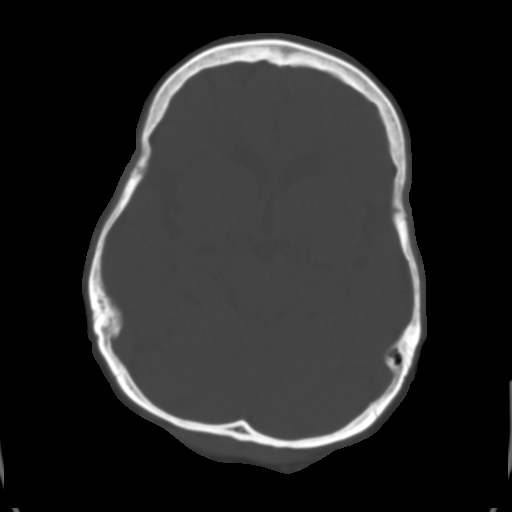
[im 14/36  brain]
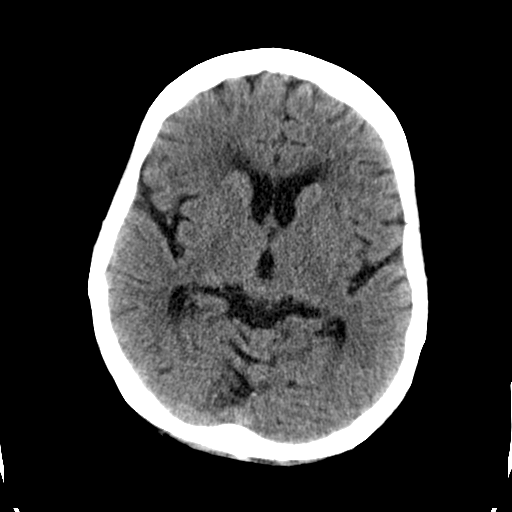
[im 16/36  brain]
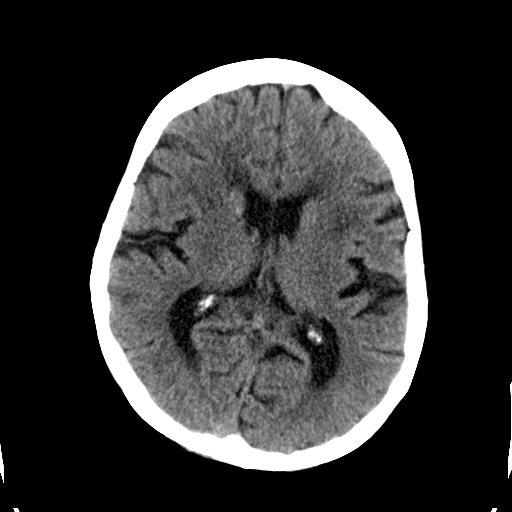
[im 19/36  brain]
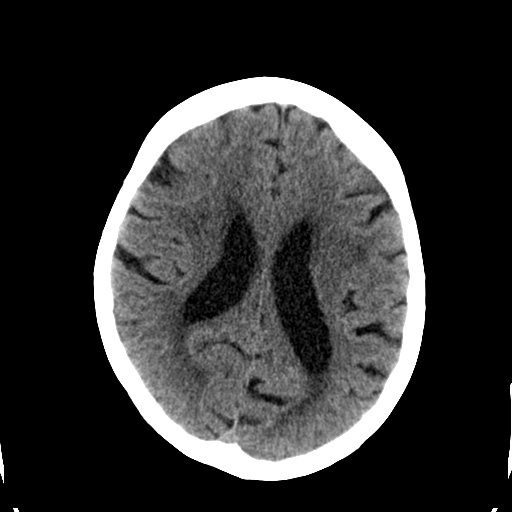
[im 20/36  brain]
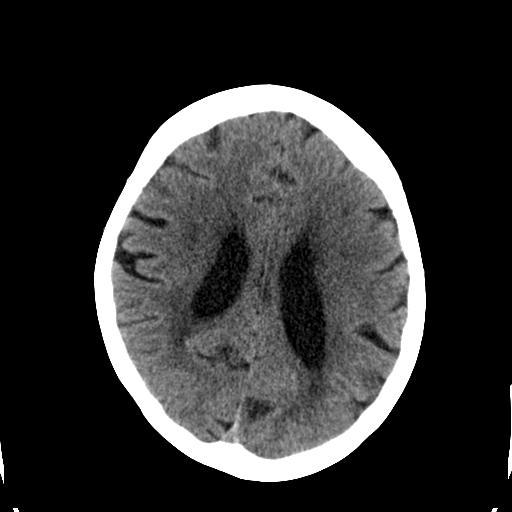
[im 20/36  bone]
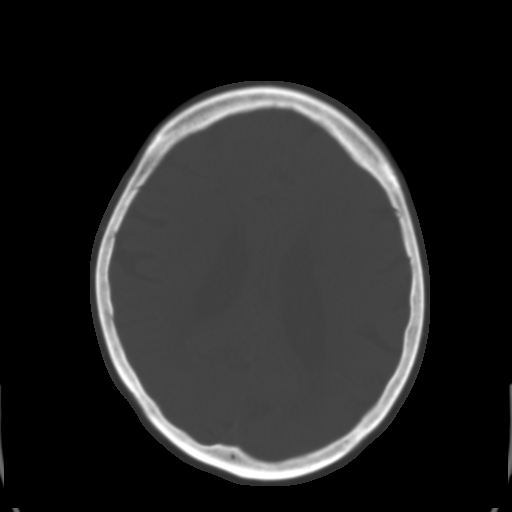
[im 22/36  brain]
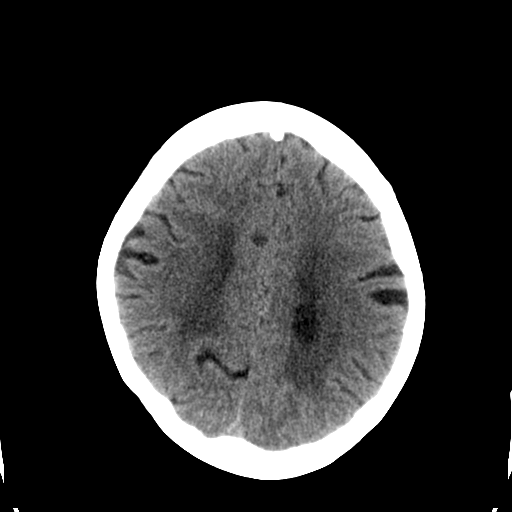
[im 25/36  brain]
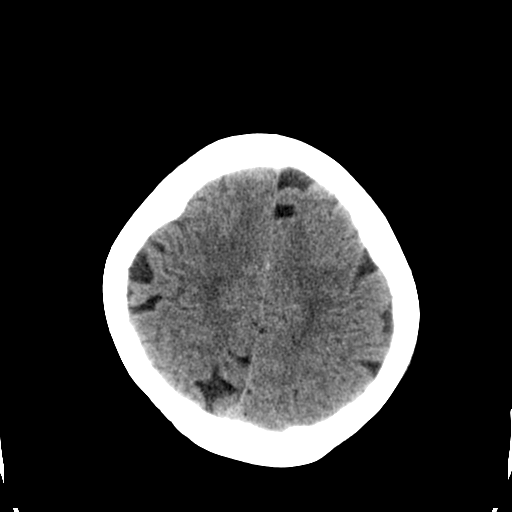
[im 27/36  brain]
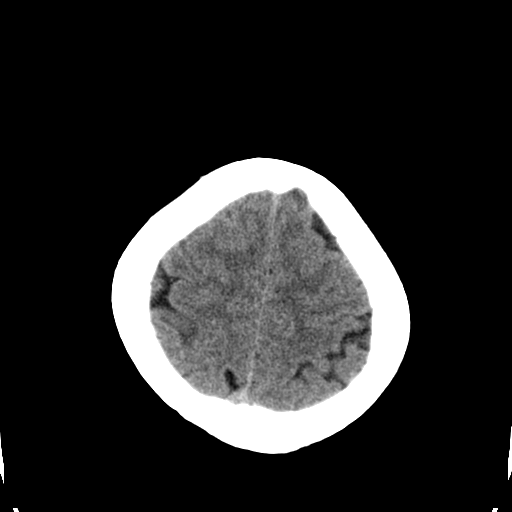
[im 29/36  brain]
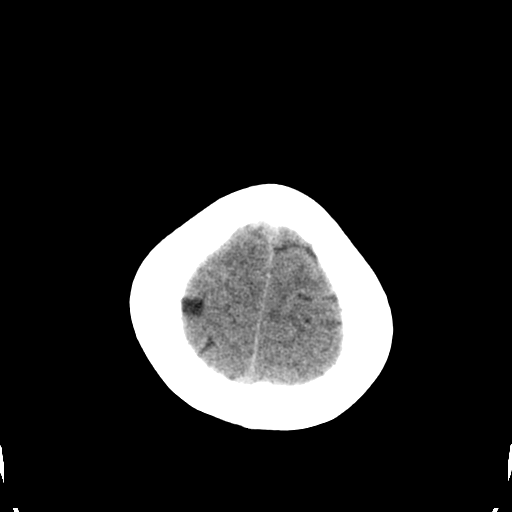
[im 29/36  bone]
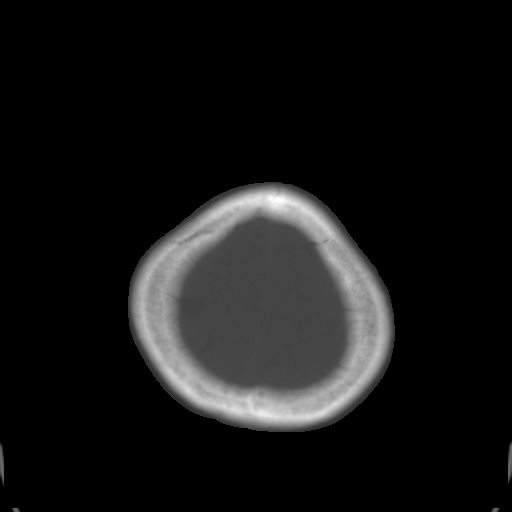
[im 32/36  brain]
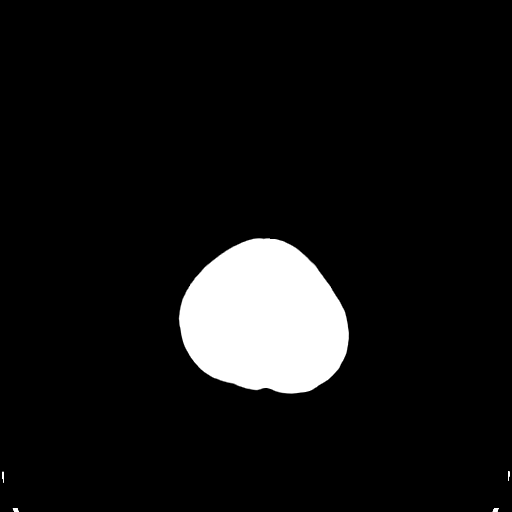
[im 34/36  brain]
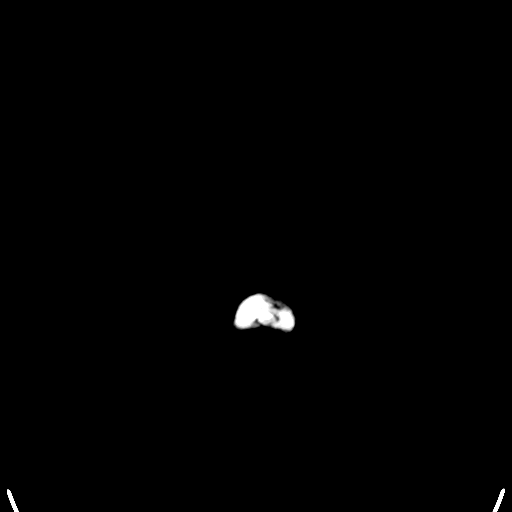

[15 of 30 positions shown; findings below may reference images not displayed]

FINDINGS: Small remote lacunar infarct to the left lentiform nucleus image 12
series 2. Otherwise, the brainstem, cerebellum, cerebral peduncles,
thalami, basal ganglia, basilar cisterns, and ventricular system
appear within normal limits. Periventricular white matter and corona
radiata hypodensities favor chronic ischemic microvascular white
matter disease.

Small right mastoid effusion. There is atherosclerotic calcification
of the cavernous carotid arteries bilaterally. There is potentially
fluid in the maxillary sinuses although this is only partially
included on today's exam. Suspect failure of fusion of the posterior
arch of C1.

No intracranial hemorrhage, mass lesion, or acute CVA.
IMPRESSION: 1. No acute intracranial findings. Underlying chronic microvascular
white matter disease has mildly progressed since the prior scan of
[DATE]. Remote lacunar infarct in the left lentiform nucleus.
3. Probable bilateral maxillary acute sinusitis. Small right mastoid
effusion.

## 2018-02-10 ENCOUNTER — Encounter: Payer: Self-pay | Admitting: Podiatry

## 2018-02-10 ENCOUNTER — Ambulatory Visit: Payer: Self-pay | Admitting: *Deleted

## 2018-02-10 ENCOUNTER — Ambulatory Visit (INDEPENDENT_AMBULATORY_CARE_PROVIDER_SITE_OTHER): Payer: Medicare Other | Admitting: Podiatry

## 2018-02-10 DIAGNOSIS — M79609 Pain in unspecified limb: Secondary | ICD-10-CM

## 2018-02-10 DIAGNOSIS — Q828 Other specified congenital malformations of skin: Secondary | ICD-10-CM

## 2018-02-10 DIAGNOSIS — D689 Coagulation defect, unspecified: Secondary | ICD-10-CM | POA: Diagnosis not present

## 2018-02-10 DIAGNOSIS — B351 Tinea unguium: Secondary | ICD-10-CM | POA: Diagnosis not present

## 2018-02-10 NOTE — Telephone Encounter (Signed)
Pt reports red area under each breast, at folds,  onset 1 week ago. Was more diffuse. Has been applying zinc oxide cream x 4 days and has resolved except for "Slender line, like a cut"  under each breast. Right side less than 1 inch, left "A little over an inch." No papules, no drainage; areas are dry. Denies pain, itching. Home care advise given; instructed pt to call back if redness spreads, becomes painful. Reason for Disposition . Mild localized rash  Answer Assessment - Initial Assessment Questions 1. APPEARANCE of RASH: "Describe the rash."      "Like a cut" Was more diffuse now resolved, "Slender line" 2. LOCATION: "Where is the rash located?"      Under each breast 3. NUMBER: "How many spots are there?"      Slender like a cut 4. SIZE: "How big are the spots?" (Inches, centimeters or compare to size of a coin)       less than an inch on right side, 1 inch on left side 5. ONSET: "When did the rash start?"      1 week ago 6. ITCHING: "Does the rash itch?" If so, ask: "How bad is the itch?"  (Scale 1-10; or mild, moderate, severe)     no 7. PAIN: "Does the rash hurt?" If so, ask: "How bad is the pain?"  (Scale 1-10; or mild, moderate, severe)     no 8. OTHER SYMPTOMS: "Do you have any other symptoms?" (e.g., fever)     no  Protocols used: RASH OR REDNESS - LOCALIZED-A-AH

## 2018-02-10 NOTE — Progress Notes (Signed)
Complaint:  Visit Type: Patient returns to my office for continued preventative foot care services. Complaint: Patient states" my nails have grown long and thick and become painful to walk and wear shoes"  The patient presents for preventative foot care services. No changes to ROS.  She says she has painful callus left foot. Her right toenail has healed with no evidence of infection.  Patient is taking plavix. Podiatric Exam: Vascular: dorsalis pedis and posterior tibial pulses are palpable bilateral. Capillary return is immediate. Temperature gradient is WNL. Skin turgor WNL  Sensorium: Normal Semmes Weinstein monofilament test. Normal tactile sensation bilaterally. Nail Exam: Pt has thick disfigured discolored nails with subungual debris noted bilateral entire nail hallux through fifth toenails Ulcer Exam: There is no evidence of ulcer or pre-ulcerative changes or infection. Orthopedic Exam: Muscle tone and strength are WNL. No limitations in general ROM. No crepitus or effusions noted. Foot type and digits show no abnormalities. Bony prominences are unremarkable. Skin:  Porokeratosis sub 2 left. No infection or ulcers  Diagnosis:  Onychomycosis, , Pain in right toe, pain in left toes, Porokeratosis Treatment & Plan Procedures and Treatment: Consent by patient was obtained for treatment procedures. The patient understood the discussion of treatment and procedures well. All questions were answered thoroughly reviewed. Debridement of mycotic and hypertrophic toenails, 1 through 5 bilateral and clearing of subungual debris. No ulceration, no infection noted. Debride porokeratosis.  ABN signed for 2019. Return Visit-Office Procedure: Patient instructed to return to the office for a follow up visit 10  weeks for continued evaluation and treatment.    Taheem Fricke DPM 

## 2018-02-13 ENCOUNTER — Encounter: Payer: Self-pay | Admitting: Family Medicine

## 2018-02-14 NOTE — Telephone Encounter (Signed)
Patient would like to know can she wait to be seen at her appointment 8/2. She was not sure if Dr Caryl Bis wanted her to come in sooner? Please advise. [phone]

## 2018-02-15 ENCOUNTER — Ambulatory Visit: Payer: Medicare Other | Admitting: Psychology

## 2018-02-15 NOTE — Telephone Encounter (Signed)
I think she should be seen sooner than August. Toni Berger sent her a message through [facility]. If she does not respond please call her. Thanks.

## 2018-02-15 NOTE — Telephone Encounter (Signed)
Pt. Called back.  She is going to wait until August appt unless something changes.

## 2018-02-15 NOTE — Telephone Encounter (Signed)
She said unless Dr. Starr Lake she needs to come in, then call and schedule appt with Dr. Caryl Bis Only.  She did not want to speak to anyone but Dr. Caryl Bis

## 2018-02-16 NOTE — Telephone Encounter (Signed)
Called patient and offered appointment for tomorrow 02/17/18 at 1130. Patient declined due to weather and she states she would rather monitor symptoms and if they worsen let us know. Offered several appointments, patient declined all.

## 2018-02-16 NOTE — Telephone Encounter (Signed)
Toni Berger spoke with the patient this morning. I will send back to her to document.

## 2018-02-17 ENCOUNTER — Ambulatory Visit: Payer: Self-pay | Admitting: Family Medicine

## 2018-02-23 ENCOUNTER — Ambulatory Visit: Payer: Self-pay | Admitting: Family Medicine

## 2018-02-23 ENCOUNTER — Other Ambulatory Visit: Payer: Self-pay | Admitting: Family Medicine

## 2018-02-24 ENCOUNTER — Ambulatory Visit: Payer: Medicare Other | Admitting: Podiatry

## 2018-02-24 NOTE — Telephone Encounter (Signed)
Last OV 10/15/17 last filled 12/24/17 90 1rf

## 2018-02-24 NOTE — Telephone Encounter (Signed)
Sent to pharmacy.  Drug database reviewed. 

## 2018-02-28 DIAGNOSIS — F341 Dysthymic disorder: Secondary | ICD-10-CM | POA: Diagnosis not present

## 2018-02-28 DIAGNOSIS — F411 Generalized anxiety disorder: Secondary | ICD-10-CM | POA: Diagnosis not present

## 2018-03-23 ENCOUNTER — Encounter: Payer: Self-pay | Admitting: Family Medicine

## 2018-03-23 NOTE — Telephone Encounter (Signed)
I called and spoke with the patient. She notes on Sunday she had liquidy yellow stool. No blood. She had some right lower quadrant abdominal discomfort with it and has had some since then. She notes only one episode today as she stopped the pravastatin and feels those symptoms were related to that. She did not change her diet. She has not vomited. She has had severe muscle cramps in her feet and legs like she had when she was on statins previously. She notes those are better today now that she has been off the pravastatin. She has had left sided sharp chest discomfort under her lateral breast. It comes on quick and goes away within a second or two. It is not exertional. Last occurred yesterday. It feels like when she broke her ribs previously. She notes on cough or dyspnea. She does note some chills and sweats at night, though no fevers. She feels fatigued overall. She feels much better today. Given that she feels better and has had few if any symptoms today I advised that she did not have to be evaluated today, though she should be evaluated prior to her scheduled appointment in August. I offered to have an appointment made for her with or another provider and she advised that she would only see me. I offered to have someone call tomorrow to make an appointment with me, though she noted she did not want to make things difficult for me given my busy schedule. I advised that she should not worry about that and that we would find an appointment for her to be evaluated. She then declined scheduling an appointment and advised me that she would wait until her appointment. I advised that if her symptoms do not remain improved or if they worsen she will need to be seen and that she could go to urgent care or the walkin. I additionally advised her that mychart was not the appropriate manner to relay her message. I discussed that mychart is not meant for symptoms to be relayed as the messages are not seen right away and may  not be looked at for several days depending on when they are sent. I discussed that this could delay appropriate care and could result in serious issues arising from her symptoms. She voiced understanding. She declined and appointment and advised me that she would call if she would like an earlier appointment. I advised that she be evaluated right away if her symptoms returned.

## 2018-04-01 ENCOUNTER — Encounter: Payer: Self-pay | Admitting: Family Medicine

## 2018-04-12 ENCOUNTER — Encounter: Payer: Self-pay | Admitting: Family Medicine

## 2018-04-15 ENCOUNTER — Ambulatory Visit (INDEPENDENT_AMBULATORY_CARE_PROVIDER_SITE_OTHER): Payer: Medicare Other | Admitting: Family Medicine

## 2018-04-15 ENCOUNTER — Encounter: Payer: Self-pay | Admitting: Family Medicine

## 2018-04-15 VITALS — BP 140/80 | HR 61 | Temp 97.9°F | Ht 67.0 in | Wt 194.4 lb

## 2018-04-15 DIAGNOSIS — E7849 Other hyperlipidemia: Secondary | ICD-10-CM | POA: Diagnosis not present

## 2018-04-15 DIAGNOSIS — R0789 Other chest pain: Secondary | ICD-10-CM | POA: Diagnosis not present

## 2018-04-15 DIAGNOSIS — R6883 Chills (without fever): Secondary | ICD-10-CM | POA: Diagnosis not present

## 2018-04-15 DIAGNOSIS — F419 Anxiety disorder, unspecified: Secondary | ICD-10-CM

## 2018-04-15 DIAGNOSIS — S2242XD Multiple fractures of ribs, left side, subsequent encounter for fracture with routine healing: Secondary | ICD-10-CM

## 2018-04-15 LAB — CBC WITH DIFFERENTIAL/PLATELET
BASOS ABS: 0.1 10*3/uL (ref 0.0–0.1)
BASOS PCT: 0.9 % (ref 0.0–3.0)
EOS ABS: 0.1 10*3/uL (ref 0.0–0.7)
Eosinophils Relative: 1.5 % (ref 0.0–5.0)
HEMATOCRIT: 39.4 % (ref 36.0–46.0)
HEMOGLOBIN: 13.1 g/dL (ref 12.0–15.0)
LYMPHS PCT: 23.5 % (ref 12.0–46.0)
Lymphs Abs: 1.8 10*3/uL (ref 0.7–4.0)
MCHC: 33.2 g/dL (ref 30.0–36.0)
MCV: 87.1 fl (ref 78.0–100.0)
Monocytes Absolute: 0.8 10*3/uL (ref 0.1–1.0)
Monocytes Relative: 11.2 % (ref 3.0–12.0)
Neutro Abs: 4.7 10*3/uL (ref 1.4–7.7)
Neutrophils Relative %: 62.9 % (ref 43.0–77.0)
Platelets: 318 10*3/uL (ref 150.0–400.0)
RBC: 4.53 Mil/uL (ref 3.87–5.11)
RDW: 13.8 % (ref 11.5–15.5)
WBC: 7.5 10*3/uL (ref 4.0–10.5)

## 2018-04-15 LAB — COMPREHENSIVE METABOLIC PANEL
ALBUMIN: 4.3 g/dL (ref 3.5–5.2)
ALK PHOS: 113 U/L (ref 39–117)
ALT: 18 U/L (ref 0–35)
AST: 17 U/L (ref 0–37)
BILIRUBIN TOTAL: 0.5 mg/dL (ref 0.2–1.2)
BUN: 21 mg/dL (ref 6–23)
CALCIUM: 9.9 mg/dL (ref 8.4–10.5)
CO2: 28 mEq/L (ref 19–32)
Chloride: 103 mEq/L (ref 96–112)
Creatinine, Ser: 0.87 mg/dL (ref 0.40–1.20)
GFR: 67.99 mL/min (ref >60.00)
Glucose, Bld: 88 mg/dL (ref 70–99)
Potassium: 5.1 mEq/L (ref 3.5–5.1)
Sodium: 137 mEq/L (ref 135–145)
Total Protein: 7.6 g/dL (ref 6.0–8.3)

## 2018-04-15 LAB — TSH: TSH: 2.61 u[IU]/mL (ref 0.35–4.50)

## 2018-04-15 NOTE — Progress Notes (Signed)
[name], MD Phone: [phone]  Toni Berger is a 72 y.o. female who presents today for follow-up.  CC: Anxiety, hyperlipidemia, rib pain, shivering  Anxiety: Notes this is up and down.  Sometimes she gets anxious in the middle the night.  She notes no depression.  She does feel lonely.  She does not have a lot of people that she is able to talk to on a consistent basis.  She tried therapy though the timing did not work for her.  She notes no SI.  She does take Klonopin 2-3 times daily.  She notes over the last month she has had she notes intermittent episodes where she will wake up shivering in the middle the night.  She does not sleep with any covers.  She will then pulled the covers over her then she will be sweating later in the night.  She does not keep it to cold in the house and this occurred last night despite the [facility] being set at 39 F.  Previously she described episodes of abdominal pain with liquid stool as well as sharp chest/rib discomfort with chills and sweats when she was taking pravastatin.  Discontinue the pravastatin and has had no issues with this since then.  She reports she does still have some left-sided rib discomfort in the area of her prior fractures.  This typically only occurs when she is laying down in bed on them.  She will change position and it will improve.  No exertional symptoms.  No shortness of breath.  Social History   Tobacco Use  Smoking Status Former Smoker  Smokeless Tobacco Never Used     ROS see history of present illness  Objective  Physical Exam Vitals:   04/15/18 1316  BP: 140/80  Pulse: 61  Temp: 97.9 F (36.6 C)  SpO2: 98%    BP Readings from Last 3 Encounters:  04/15/18 140/80  01/20/18 130/82  10/15/17 130/70   Wt Readings from Last 3 Encounters:  04/15/18 194 lb 6.4 oz (88.2 kg)  01/20/18 192 lb 8 oz (87.3 kg)  10/15/17 192 lb (87.1 kg)    Physical Exam  Constitutional: No distress.  Cardiovascular:  Normal rate, regular rhythm and normal heart sounds.  Pulmonary/Chest: Effort normal and breath sounds normal.  Musculoskeletal: She exhibits no edema.  Slight tenderness over left anterior lateral ribs with no bony defects palpated  Neurological: She is alert.  Skin: Skin is warm and dry. She is not diaphoretic.     Assessment/Plan: Please see individual problem list.  Closed rib fracture Patient with prior rib fractures that appeared healed on most recent imaging.  Previously offered referral though she declined.  Given persistence we will obtain CT to evaluate for other causes.  Anxiety Continues to have issues with this.  She is unsure about the additional medication for this.  I discussed options and she will consider.  Hyperlipidemia Patient had several side effects related to her pravastatin.  Those have resolved.  At this time she will hold off on any additional medication.  Consider Zetia or Repatha in the future.  Shivering Undetermined cause.  Check lab work.   Orders Placed This Encounter  Procedures  . CT Chest Wo Contrast    Standing Status:   Future    Standing Expiration Date:   06/16/2019    Order Specific Question:   Preferred imaging location?    Answer:   [facility]    Order Specific Question:   Radiology Contrast  Protocol - do NOT remove file path    Answer:   [email or web address]  . CBC w/Diff  . Comp Met (CMET)  . TSH    No orders of the defined types were placed in this encounter.    [name], MD [place]

## 2018-04-15 NOTE — Patient Instructions (Signed)
Nice to see you. We will get you set up for a CT scan of your chest to evaluate your rib discomfort. Please monitor your anxiety.  If it worsens please let us know. We will check some lab work to evaluate a cause of your shivering.

## 2018-04-19 DIAGNOSIS — R6883 Chills (without fever): Secondary | ICD-10-CM | POA: Insufficient documentation

## 2018-04-19 NOTE — Assessment & Plan Note (Signed)
Continues to have issues with this.  She is unsure about the additional medication for this.  I discussed options and she will consider.

## 2018-04-19 NOTE — Assessment & Plan Note (Signed)
Undetermined cause.  Check lab work.

## 2018-04-19 NOTE — Assessment & Plan Note (Signed)
Patient had several side effects related to her pravastatin.  Those have resolved.  At this time she will hold off on any additional medication.  Consider Zetia or Repatha in the future.

## 2018-04-19 NOTE — Assessment & Plan Note (Signed)
Patient with prior rib fractures that appeared healed on most recent imaging.  Previously offered referral though she declined.  Given persistence we will obtain CT to evaluate for other causes.

## 2018-04-21 ENCOUNTER — Ambulatory Visit
Admission: RE | Admit: 2018-04-21 | Discharge: 2018-04-21 | Disposition: A | Discharge status: Home / Self Care | Payer: Medicare Other | Source: Ambulatory Visit | Attending: Family Medicine | Admitting: Family Medicine

## 2018-04-21 DIAGNOSIS — E279 Disorder of adrenal gland, unspecified: Secondary | ICD-10-CM | POA: Insufficient documentation

## 2018-04-21 DIAGNOSIS — K449 Diaphragmatic hernia without obstruction or gangrene: Secondary | ICD-10-CM | POA: Insufficient documentation

## 2018-04-21 DIAGNOSIS — Z8781 Personal history of (healed) traumatic fracture: Secondary | ICD-10-CM | POA: Diagnosis not present

## 2018-04-21 DIAGNOSIS — S2242XA Multiple fractures of ribs, left side, initial encounter for closed fracture: Secondary | ICD-10-CM | POA: Diagnosis not present

## 2018-04-21 DIAGNOSIS — I700 Atherosclerosis of aorta: Secondary | ICD-10-CM | POA: Diagnosis not present

## 2018-04-21 DIAGNOSIS — R0789 Other chest pain: Secondary | ICD-10-CM | POA: Insufficient documentation

## 2018-04-23 ENCOUNTER — Encounter: Payer: Self-pay | Admitting: Family Medicine

## 2018-04-23 DIAGNOSIS — I700 Atherosclerosis of aorta: Secondary | ICD-10-CM | POA: Insufficient documentation

## 2018-04-23 DIAGNOSIS — D3502 Benign neoplasm of left adrenal gland: Secondary | ICD-10-CM | POA: Insufficient documentation

## 2018-04-23 DIAGNOSIS — K449 Diaphragmatic hernia without obstruction or gangrene: Secondary | ICD-10-CM | POA: Insufficient documentation

## 2018-04-24 ENCOUNTER — Other Ambulatory Visit: Payer: Self-pay | Admitting: Family Medicine

## 2018-04-25 ENCOUNTER — Encounter: Payer: Self-pay | Admitting: Family Medicine

## 2018-04-25 ENCOUNTER — Other Ambulatory Visit: Payer: Self-pay | Admitting: Family Medicine

## 2018-04-25 ENCOUNTER — Ambulatory Visit (INDEPENDENT_AMBULATORY_CARE_PROVIDER_SITE_OTHER): Payer: Medicare Other | Admitting: Podiatry

## 2018-04-25 ENCOUNTER — Telehealth: Payer: Self-pay | Admitting: Family Medicine

## 2018-04-25 ENCOUNTER — Encounter: Payer: Self-pay | Admitting: Podiatry

## 2018-04-25 DIAGNOSIS — B351 Tinea unguium: Secondary | ICD-10-CM

## 2018-04-25 DIAGNOSIS — D689 Coagulation defect, unspecified: Secondary | ICD-10-CM | POA: Diagnosis not present

## 2018-04-25 DIAGNOSIS — Q828 Other specified congenital malformations of skin: Secondary | ICD-10-CM | POA: Diagnosis not present

## 2018-04-25 DIAGNOSIS — M79609 Pain in unspecified limb: Secondary | ICD-10-CM | POA: Diagnosis not present

## 2018-04-25 DIAGNOSIS — D3500 Benign neoplasm of unspecified adrenal gland: Secondary | ICD-10-CM

## 2018-04-25 MED ORDER — CLONAZEPAM 1 MG PO TABS
ORAL_TABLET | ORAL | 1 refills | Status: DC
Start: 2018-04-25 — End: 2018-06-22

## 2018-04-25 MED ORDER — DEXAMETHASONE 1 MG PO TABS: 1.0000 mg | ORAL_TABLET | Freq: Once | ORAL | 0 refills | Status: AC

## 2018-04-25 NOTE — Telephone Encounter (Signed)
Copied from [place] [id number]. Topic: Quick Communication - See Telephone Encounter >> Apr 25, 2018  1:37 PM Blase Mess A wrote: CRM for notification. See Telephone encounter for: 04/25/18. Patient called requesting the name of the one pill that was requested for her to take prior to her lab work on Thursday 04/28/18.  Please advise.  Patient is requesting a call [phone] (M)

## 2018-04-25 NOTE — Telephone Encounter (Signed)
Please advise 

## 2018-04-25 NOTE — Telephone Encounter (Signed)
Last OV 04/15/18 last filled 02/24/18 90 1rf

## 2018-04-25 NOTE — Progress Notes (Signed)
Complaint:  Visit Type: Patient returns to my office for continued preventative foot care services. Complaint: Patient states" my nails have grown long and thick and become painful to walk and wear shoes"  The patient presents for preventative foot care services. No changes to ROS.  She says she has painful callus left foot. Her right toenail has healed with no evidence of infection.  Patient is taking plavix. Podiatric Exam: Vascular: dorsalis pedis and posterior tibial pulses are palpable bilateral. Capillary return is immediate. Temperature gradient is WNL. Skin turgor WNL  Sensorium: Normal Semmes Weinstein monofilament test. Normal tactile sensation bilaterally. Nail Exam: Pt has thick disfigured discolored nails with subungual debris noted bilateral entire nail hallux through fifth toenails Ulcer Exam: There is no evidence of ulcer or pre-ulcerative changes or infection. Orthopedic Exam: Muscle tone and strength are WNL. No limitations in general ROM. No crepitus or effusions noted. Foot type and digits show no abnormalities. Bony prominences are unremarkable. Skin:  Porokeratosis sub 2 left. No infection or ulcers  Diagnosis:  Onychomycosis, , Pain in right toe, pain in left toes, Porokeratosis Treatment & Plan Procedures and Treatment: Consent by patient was obtained for treatment procedures. The patient understood the discussion of treatment and procedures well. All questions were answered thoroughly reviewed. Debridement of mycotic and hypertrophic toenails, 1 through 5 bilateral and clearing of subungual debris. No ulceration, no infection noted. Debride porokeratosis.  ABN signed for 2019. Return Visit-Office Procedure: Patient instructed to return to the office for a follow up visit 10  weeks for continued evaluation and treatment.    Gardiner Barefoot DPM

## 2018-04-29 ENCOUNTER — Telehealth: Payer: Self-pay | Admitting: Family Medicine

## 2018-04-29 ENCOUNTER — Other Ambulatory Visit (INDEPENDENT_AMBULATORY_CARE_PROVIDER_SITE_OTHER): Payer: Medicare Other

## 2018-04-29 DIAGNOSIS — D3500 Benign neoplasm of unspecified adrenal gland: Secondary | ICD-10-CM | POA: Diagnosis not present

## 2018-04-29 LAB — CORTISOL: Cortisol, Plasma: 0.7 ug/dL

## 2018-04-29 NOTE — Telephone Encounter (Signed)
Patient notified that her level came back normal

## 2018-04-29 NOTE — Telephone Encounter (Signed)
Please advise 

## 2018-04-29 NOTE — Telephone Encounter (Signed)
Copied from [place] [id number]. Topic: Quick Communication - See Telephone Encounter >> Apr 29, 2018  2:32 PM Mylinda Latina, NT wrote: CRM for notification. See Telephone encounter for: 04/29/18. Patient called and has questions about her lab results and the 24hrs urine collection. Patient is requesting a call back  CB# [phone]

## 2018-05-02 ENCOUNTER — Other Ambulatory Visit: Payer: Medicare Other

## 2018-05-02 DIAGNOSIS — D3500 Benign neoplasm of unspecified adrenal gland: Secondary | ICD-10-CM | POA: Diagnosis not present

## 2018-05-04 LAB — ALDOSTERONE + RENIN ACTIVITY W/ RATIO
ALDO / PRA Ratio: 2.9 Ratio (ref 0.9–28.9)
ALDOSTERONE: 3 ng/dL
Renin Activity: 1.04 ng/mL/h (ref 0.25–5.82)

## 2018-05-07 LAB — METANEPHRINES, URINE, 24 HOUR
METANEPHRINES UR: 64 ug/(24.h) — AB (ref 90–315)
Metaneph Total, Ur: 309 mcg/24 h (ref 224–832)
NORMETANEPHRINE 24H UR: 245 ug/(24.h) (ref 122–676)
Volume, Urine-VMAUR: 2300 mL

## 2018-05-07 LAB — CATECHOLAMINES, FRACTIONATED, URINE, 24 HOUR
Calculated Total (E+NE): 33 mcg/24 h (ref 26–121)
Creatinine, Urine mg/day-CATEUR: 0.72 g/(24.h) (ref 0.50–2.15)
DOPAMINE, 24 HR URINE: 139 ug/(24.h) (ref 52–480)
Norepinephrine, 24 hr Ur: 33 mcg/24 h (ref 15–100)
Volume, Urine-VMAUR: 2300 mL

## 2018-05-08 ENCOUNTER — Encounter: Payer: Self-pay | Admitting: Family Medicine

## 2018-05-26 ENCOUNTER — Encounter: Payer: Self-pay | Admitting: Family Medicine

## 2018-05-26 ENCOUNTER — Ambulatory Visit: Payer: Self-pay | Admitting: Family Medicine

## 2018-05-26 ENCOUNTER — Ambulatory Visit (INDEPENDENT_AMBULATORY_CARE_PROVIDER_SITE_OTHER): Payer: Medicare Other | Admitting: Family Medicine

## 2018-05-26 VITALS — BP 124/68 | HR 73 | Temp 98.5°F | Ht 68.0 in | Wt 195.2 lb

## 2018-05-26 DIAGNOSIS — R05 Cough: Secondary | ICD-10-CM | POA: Diagnosis not present

## 2018-05-26 DIAGNOSIS — R059 Cough, unspecified: Secondary | ICD-10-CM

## 2018-05-26 DIAGNOSIS — K449 Diaphragmatic hernia without obstruction or gangrene: Secondary | ICD-10-CM | POA: Diagnosis not present

## 2018-05-26 NOTE — Progress Notes (Signed)
Subjective:    Patient ID: Toni Berger, female    DOB: [birth year], 72 y.o.   MRN: [id number]  HPI   Patient presents to clinic due to episode out coughing and upper ABD/lower chest pain on Tuesday night (2 days ago). States she woke up coughing, cough was harsh and she also c/o wheezing during the coughing episode. States the coughing also hurt her left ribs -- has hx of rib fracture on left side in 2017.  States she has had no more coughing or wheeze since that one episode on Tuesday night.  She did have Chest CT in August 2019, results unremarkable: IMPRESSION: Chronic healed rib fractures on the left.  Small sliding-type hiatal hernia.  15 mm hypodense lesion within the left adrenal gland. Statistically this likely represents an adenoma. Follow-up examination in 12 months is recommended to assess for stability.  Aortic Atherosclerosis (ICD10-I70.0).  Patient states she did buy some tums to use for heartburn as needed, but has only taken 2 tums since buying the bottle a week ago.  Recent lab work reviewed: CBC Latest Ref Rng & Units 04/15/2018 07/15/2017 04/09/2017  WBC 4.0 - 10.5 K/uL 7.5 8.1 7.7  Hemoglobin 12.0 - 15.0 g/dL 13.1 12.7 12.7  Hematocrit 36.0 - 46.0 % 39.4 39.4 39.0  Platelets 150.0 - 400.0 K/uL 318.0 286.0 290.0   CMP Latest Ref Rng & Units 04/15/2018 11/19/2017 10/15/2017  Glucose 70 - 99 mg/dL 88 - 100(H)  BUN 6 - 23 mg/dL 21 - 22  Creatinine 0.40 - 1.20 mg/dL 0.87 - 0.80  Sodium 135 - 145 mEq/L 137 - 138  Potassium 3.5 - 5.1 mEq/L 5.1 - 4.1  Chloride 96 - 112 mEq/L 103 - 105  CO2 19 - 32 mEq/L 28 - 28  Calcium 8.4 - 10.5 mg/dL 9.9 - 9.4  Total Protein 6.0 - 8.3 g/dL 7.6 7.3 7.3  Total Bilirubin 0.2 - 1.2 mg/dL 0.5 0.6 0.4  Alkaline Phos 39 - 117 U/L 113 103 114  AST 0 - 37 U/L 17 17 18   ALT 0 - 35 U/L 18 16 17    TSH 04/15/18: 2.61     Patient Active Problem List   Diagnosis Date Noted  . Adrenal adenoma, left 04/23/2018  . Hiatal hernia  04/23/2018  . Aortic atherosclerosis ([place]) 04/23/2018  . Shivering 04/19/2018  . Wheezing 10/15/2017  . Cramps of lower extremity 07/15/2017  . Fatigue 07/15/2017  . Decreased pedal pulses 07/30/2016  . Burning pain 06/05/2016  . 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 Ankle edema 03/23/2016  . Hyperlipidemia 01/09/2016  . Closed rib fracture 01/09/2016  . 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 Loss of consciousness ([place]) 12/15/2015  . Rotator cuff impingement syndrome 12/15/2015  . Fall in home 10/29/2015  . Cerebrovascular accident, old 10/29/2015  . HYPERTENSION, BENIGN 11/12/2010  . OSTEOARTHRITIS 08/13/2010  . 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 Anxiety 02/09/2008  . RESTLESS LEG SYNDROME 02/09/2008  . ALLERGIC RHINITIS 02/09/2008   Social History   Tobacco Use  . Smoking status: Former Research scientist (life sciences)  . Smokeless tobacco: Never Used  Substance Use Topics  . Alcohol use: No   Review of Systems  **coughing and wheezing has resolved, occurred x1 episode.   Constitutional: Negative for chills, fatigue and fever.  HENT: Negative for congestion, ear pain, sinus pain and sore throat.   Eyes: Negative.   Respiratory: Negative for cough, shortness of breath and wheezing.   Cardiovascular: Negative for chest pain, palpitations and leg swelling.  Gastrointestinal: Negative for abdominal pain, diarrhea, nausea and vomiting.  Genitourinary: Negative for dysuria, frequency and urgency.  Musculoskeletal: Negative for arthralgias and myalgias.  Skin: Negative for color change, pallor and rash.  Neurological: Negative for syncope, light-headedness and headaches.  Psychiatric/Behavioral: The patient is not nervous/anxious.       Objective:   Physical Exam  Constitutional: She is oriented to person, place, and time. She appears well-developed and well-nourished. No distress.  HENT:  Head: Normocephalic and atraumatic.  Right Ear: External ear normal.  Left Ear: External ear normal.    Mouth/Throat: Oropharynx is clear and moist.  Eyes: Pupils are equal, round, and reactive to light. EOM are normal. No scleral icterus.  Neck: No tracheal deviation present.  Cardiovascular: Normal rate, regular rhythm and normal heart sounds.  Pulmonary/Chest: Effort normal and breath sounds normal. No respiratory distress. She has no wheezes. She has no rales.  Abdominal: Soft. She exhibits no distension.  Musculoskeletal: She exhibits no edema.  Neurological: She is alert and oriented to person, place, and time.  Skin: Skin is warm and dry. She is not diaphoretic. No pallor.  Psychiatric: She has a normal mood and affect. Her behavior is normal.  Nursing note and vitals reviewed.  Vitals:   05/26/18 1127  BP: 124/68  Pulse: 73  Temp: 98.5 F (36.9 C)  SpO2: 95%      Assessment & Plan:   A total of 25  minutes were spent face-to-face with the patient during this encounter and over half of that time was spent on counseling and coordination of care. The patient was counseled on possible reasons for cough & reassurance her lungs sound clear and most recent scan and labs are stable.   Cough -- Cough has resolved. Lung sounds clear. Offered to do CXR in clinic for reassurance, but patient declines. We reviewed her CT chest results and this helped reassure patient.   Hiatal Hernia -- Seen on chest CT, discussed options for heartburn control Pepcid, zantac or prilosec are good over the counter options that can be taken as needed for heartburn (they last longer than a tums).  Keep regularly scheduled follow up. Return to clinic sooner if symptoms persist or worsen.

## 2018-05-26 NOTE — Patient Instructions (Signed)
Pepcid, zantac or prilosec are good over the counter options that can be taken as needed for heartburn (they last longer than a tums)   Heartburn Heartburn is a type of pain or discomfort that can happen in the throat or chest. It is often described as a burning pain. It may also cause a bad taste in the mouth. Heartburn may feel worse when you lie down or bend over. It may be caused by stomach contents that move back up (reflux) into the tube that connects the mouth with the stomach (esophagus). Follow these instructions at home: Take these actions to lessen your discomfort and to help avoid problems. Diet  Follow a diet as told by your doctor. You may need to avoid foods and drinks such as: ? Coffee and tea (with or without caffeine). ? Drinks that contain alcohol. ? Energy drinks and sports drinks. ? Carbonated drinks or sodas. ? Chocolate and cocoa. ? Peppermint and mint flavorings. ? Garlic and onions. ? Horseradish. ? Spicy and acidic foods, such as peppers, chili powder, curry powder, vinegar, hot sauces, and BBQ sauce. ? Citrus fruit juices and citrus fruits, such as oranges, lemons, and limes. ? Tomato-based foods, such as red sauce, chili, salsa, and pizza with red sauce. ? Fried and fatty foods, such as donuts, french fries, potato chips, and high-fat dressings. ? High-fat meats, such as hot dogs, rib eye steak, sausage, ham, and bacon. ? High-fat dairy items, such as whole milk, butter, and cream cheese.  Eat small meals often. Avoid eating large meals.  Avoid drinking large amounts of liquid with your meals.  Avoid eating meals during the 2-3 hours before bedtime.  Avoid lying down right after you eat.  Do not exercise right after you eat. General instructions  Pay attention to any changes in your symptoms.  Take over-the-counter and prescription medicines only as told by your doctor. Do not take aspirin, ibuprofen, or other NSAIDs unless your doctor says it is  okay.  Do not use any tobacco products, including cigarettes, chewing tobacco, and e-cigarettes. If you need help quitting, ask your doctor.  Wear loose clothes. Do not wear anything tight around your waist.  Raise (elevate) the head of your bed about 6 inches (15 cm).  Try to lower your stress. If you need help doing this, ask your doctor.  If you are overweight, lose an amount of weight that is healthy for you. Ask your doctor about a safe weight loss goal.  Keep all follow-up visits as told by your doctor. This is important. Contact a doctor if:  You have new symptoms.  You lose weight and you do not know why it is happening.  You have trouble swallowing, or it hurts to swallow.  You have wheezing or a cough that keeps happening.  Your symptoms do not get better with treatment.  You have heartburn often for more than two weeks. Get help right away if:  You have pain in your arms, neck, jaw, teeth, or back.  You feel sweaty, dizzy, or light-headed.  You have chest pain or shortness of breath.  You throw up (vomit) and your throw up looks like blood or coffee grounds.  Your poop (stool) is bloody or black. This information is not intended to replace advice given to you by your health care provider. Make sure you discuss any questions you have with your health care provider. Document Released: 05/13/2011 Document Revised: 02/06/2016 Document Reviewed: 12/26/2014 Elsevier Interactive Patient Education  2018  [employer].

## 2018-06-09 DIAGNOSIS — H2511 Age-related nuclear cataract, right eye: Secondary | ICD-10-CM | POA: Diagnosis not present

## 2018-06-22 ENCOUNTER — Other Ambulatory Visit: Payer: Self-pay | Admitting: Family Medicine

## 2018-06-23 NOTE — Telephone Encounter (Signed)
Last OV 05/26/2018   Last refilled 04/25/2018 disp 90 with 1 refill   Sent to PCP for approval

## 2018-06-24 NOTE — Telephone Encounter (Signed)
Sent to pharmacy.  Controlled substance database reviewed. 

## 2018-06-28 ENCOUNTER — Encounter: Payer: Self-pay | Admitting: Family Medicine

## 2018-06-28 ENCOUNTER — Other Ambulatory Visit: Payer: Self-pay | Admitting: Family Medicine

## 2018-07-07 ENCOUNTER — Encounter: Payer: Self-pay | Admitting: Podiatry

## 2018-07-07 ENCOUNTER — Ambulatory Visit (INDEPENDENT_AMBULATORY_CARE_PROVIDER_SITE_OTHER): Payer: Medicare Other | Admitting: Podiatry

## 2018-07-07 ENCOUNTER — Ambulatory Visit: Payer: Self-pay | Admitting: Family Medicine

## 2018-07-07 DIAGNOSIS — D689 Coagulation defect, unspecified: Secondary | ICD-10-CM

## 2018-07-07 DIAGNOSIS — B351 Tinea unguium: Secondary | ICD-10-CM

## 2018-07-07 DIAGNOSIS — M79676 Pain in unspecified toe(s): Secondary | ICD-10-CM | POA: Diagnosis not present

## 2018-07-07 DIAGNOSIS — Q828 Other specified congenital malformations of skin: Secondary | ICD-10-CM

## 2018-07-07 DIAGNOSIS — M79609 Pain in unspecified limb: Principal | ICD-10-CM

## 2018-07-07 NOTE — Progress Notes (Addendum)
Complaint:  Visit Type: Patient returns to my office for continued preventative foot care services. Complaint: Patient states" my nails have grown long and thick and become painful to walk and wear shoes"  The patient presents for preventative foot care services. No changes to ROS.  She says she has painful callus left foot. Her right toenail has healed with no evidence of infection.  Patient is taking plavix. Podiatric Exam: Vascular: dorsalis pedis and posterior tibial pulses are palpable bilateral. Capillary return is immediate. Temperature gradient is WNL. Skin turgor WNL  Sensorium: Normal Semmes Weinstein monofilament test. Normal tactile sensation bilaterally. Nail Exam: Pt has thick disfigured discolored nails with subungual debris noted bilateral entire nail hallux through fifth toenails Ulcer Exam: There is no evidence of ulcer or pre-ulcerative changes or infection. Orthopedic Exam: Muscle tone and strength are WNL. No limitations in general ROM. No crepitus or effusions noted. Foot type and digits show no abnormalities. HAV  B/L. Skin:  Porokeratosis sub 2 left. No infection or ulcers  Diagnosis:  Onychomycosis, , Pain in right toe, pain in left toes, Porokeratosis Treatment & Plan Procedures and Treatment: Consent by patient was obtained for treatment procedures. The patient understood the discussion of treatment and procedures well. All questions were answered thoroughly reviewed. Debridement of mycotic and hypertrophic toenails, 1 through 5 bilateral and clearing of subungual debris. No ulceration, no infection noted. Debride porokeratosis.  ABN signed for 2019. Return Visit-Office Procedure: Patient instructed to return to the office for a follow up visit 10  weeks for continued evaluation and treatment.    Gardiner Barefoot DPM

## 2018-07-08 NOTE — Telephone Encounter (Signed)
Patient refused appointment with NP she wants to see PCP next month at her appointment.

## 2018-07-11 ENCOUNTER — Encounter: Payer: Self-pay | Admitting: Family Medicine

## 2018-07-11 ENCOUNTER — Ambulatory Visit (INDEPENDENT_AMBULATORY_CARE_PROVIDER_SITE_OTHER): Payer: Medicare Other

## 2018-07-11 ENCOUNTER — Ambulatory Visit (INDEPENDENT_AMBULATORY_CARE_PROVIDER_SITE_OTHER): Payer: Medicare Other | Admitting: Family Medicine

## 2018-07-11 VITALS — BP 124/66 | HR 65 | Temp 98.3°F | Ht 68.0 in | Wt 198.2 lb

## 2018-07-11 DIAGNOSIS — Z634 Disappearance and death of family member: Secondary | ICD-10-CM

## 2018-07-11 DIAGNOSIS — M542 Cervicalgia: Secondary | ICD-10-CM | POA: Diagnosis not present

## 2018-07-11 DIAGNOSIS — Z8673 Personal history of transient ischemic attack (TIA), and cerebral infarction without residual deficits: Secondary | ICD-10-CM

## 2018-07-11 DIAGNOSIS — F4329 Adjustment disorder with other symptoms: Secondary | ICD-10-CM

## 2018-07-11 DIAGNOSIS — R0781 Pleurodynia: Secondary | ICD-10-CM

## 2018-07-11 DIAGNOSIS — E538 Deficiency of other specified B group vitamins: Secondary | ICD-10-CM | POA: Diagnosis not present

## 2018-07-11 DIAGNOSIS — E559 Vitamin D deficiency, unspecified: Secondary | ICD-10-CM

## 2018-07-11 DIAGNOSIS — M4802 Spinal stenosis, cervical region: Secondary | ICD-10-CM | POA: Diagnosis not present

## 2018-07-11 DIAGNOSIS — M79601 Pain in right arm: Secondary | ICD-10-CM | POA: Diagnosis not present

## 2018-07-11 DIAGNOSIS — R5383 Other fatigue: Secondary | ICD-10-CM

## 2018-07-11 DIAGNOSIS — F4321 Adjustment disorder with depressed mood: Secondary | ICD-10-CM

## 2018-07-11 LAB — CBC WITH DIFFERENTIAL/PLATELET
BASOS ABS: 0 10*3/uL (ref 0.0–0.1)
Basophils Relative: 0.4 % (ref 0.0–3.0)
Eosinophils Absolute: 0.1 10*3/uL (ref 0.0–0.7)
Eosinophils Relative: 1.9 % (ref 0.0–5.0)
HEMATOCRIT: 41.2 % (ref 36.0–46.0)
HEMOGLOBIN: 13.6 g/dL (ref 12.0–15.0)
LYMPHS PCT: 24.8 % (ref 12.0–46.0)
Lymphs Abs: 1.8 10*3/uL (ref 0.7–4.0)
MCHC: 32.9 g/dL (ref 30.0–36.0)
MCV: 88.3 fl (ref 78.0–100.0)
MONOS PCT: 10.1 % (ref 3.0–12.0)
Monocytes Absolute: 0.7 10*3/uL (ref 0.1–1.0)
NEUTROS PCT: 62.8 % (ref 43.0–77.0)
Neutro Abs: 4.6 10*3/uL (ref 1.4–7.7)
Platelets: 324 10*3/uL (ref 150.0–400.0)
RBC: 4.67 Mil/uL (ref 3.87–5.11)
RDW: 13.9 % (ref 11.5–15.5)
WBC: 7.3 10*3/uL (ref 4.0–10.5)

## 2018-07-11 LAB — THYROID PANEL WITH TSH
FREE THYROXINE INDEX: 2 (ref 1.4–3.8)
T3 Uptake: 26 % (ref 22–35)
T4, Total: 7.6 ug/dL (ref 5.1–11.9)
TSH: 2.77 m[IU]/L (ref 0.40–4.50)

## 2018-07-11 LAB — B12 AND FOLATE PANEL
Folate: >23.9 ng/mL (ref >5.9)
VITAMIN B 12: 241 pg/mL (ref 211–911)

## 2018-07-11 LAB — VITAMIN D 25 HYDROXY (VIT D DEFICIENCY, FRACTURES): VITD: 22.12 ng/mL — AB (ref 30.00–100.00)

## 2018-07-11 NOTE — Progress Notes (Signed)
Subjective:    Patient ID: Toni Berger, female    DOB: 07/28/1946, 72 y.o.   MRN: [id number]  HPI   Patient presents to clinic with multiple complaints.  First complaint is pain on her left side/left ribs under left breast in the same area where she fell and hit and broke ribs in 2017.  Patient states this area is tender to touch, and she is unable to get comfortable at night.  States she will use a heating pad on area with some relief, but still cannot get comfortable.  Denies shortness of breath or palpitations.  Denies feeling dizzy or fainting.  Patient also reports pain on right side of neck especially when leaning head down to look on her right armpit while shaving.  Patient states the pain will come as sharp and will go away quickly.  Patient states she will rub the area and pain usually goes away on its own.  Patient is concerned that this could be related to her past CVA and would like her carotid arteries checked.  Patient also complains of pain in right upper arm states she notices especially when she goes to raise arm up will have pain in the bicep area/humerus bone area of arm.  Patient believes that this pain could also be related to her past CVA due to some right-sided weakness she has had in the arm ever since.  Patient states she never did any physical therapy after her stroke.  Patient also complains of having no energy.  States she will vacuum her house, that have to sit down or rest because she just cannot go any further.  Patient lost her husband little over a year ago, ever since she has been lonely.  [place]  Neighbors are far from her and she states they never talk to her.  When asked if she tries to talk to them, states she waved one time and the return there had more clear direction, so she feels they are weird people.  Patient states a lot of her family has not reached out to her ever since her husband's death as well, and this has been  tough.  Patient recently took different exams to be a member of the [employer] & has been called to do some volunteering with blood drives, states she will try to do this but is unsure if she can do it with her feeling so fatigued. Patient states she is not depressed.  Imaging of chest done in August 2019 reviewed -  Chest CT scan 04/21/2018  FINDINGS: Cardiovascular: Mild atherosclerotic calcifications of the thoracic aorta are noted. No aneurysmal dilatation is seen. Coronary calcifications are seen. No cardiac enlargement is noted.  Mediastinum/Nodes: Small sliding-type hiatal hernia is noted. The esophagus is otherwise within normal limits. Thoracic inlet is unremarkable. No sizable hilar or mediastinal adenopathy is noted.  Lungs/Pleura: Lungs are well aerated bilaterally. No focal infiltrate or sizable effusion is seen. No parenchymal nodules are noted.  Upper Abdomen: 15 mm hypodense lesion is noted within the left adrenal gland. Statistically this likely represents an adenoma. It has decreased overall attenuation. The gallbladder has been surgically removed.  Musculoskeletal: Degenerative changes of thoracic spine are noted. Multiple healed rib fractures are noted on the left stable from previous exams. No acute bony abnormality is noted. T8 vertebral hemangioma is noted.  IMPRESSION: Chronic healed rib fractures on the left.  Small sliding-type hiatal hernia.  15 mm hypodense lesion within  the left adrenal gland. Statistically this likely represents an adenoma. Follow-up examination in 12 months is recommended to assess for stability.  Recent lab work done in August 2019 also reviewed, all results are unremarkable for abnormality.  Patient Active Problem List   Diagnosis Date Noted  . Adrenal adenoma, left 04/23/2018  . Hiatal hernia 04/23/2018  . Aortic atherosclerosis ([place]) 04/23/2018  . Shivering 04/19/2018  . Wheezing 10/15/2017  . Cramps of lower  extremity 07/15/2017  . Fatigue 07/15/2017  . Decreased pedal pulses 07/30/2016  . Burning pain 06/05/2016  . Complicated grief 16/09/930  . Ankle edema 03/23/2016  . Hyperlipidemia 01/09/2016  . Closed rib fracture 01/09/2016  . Remote lacunar infarction ([place]) 12/15/2015  . Loss of consciousness ([place]) 12/15/2015  . Rotator cuff impingement syndrome 12/15/2015  . Fall in home 10/29/2015  . Cerebrovascular accident, old 10/29/2015  . HYPERTENSION, BENIGN 11/12/2010  . OSTEOARTHRITIS 08/13/2010  . 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 Anxiety 02/09/2008  . RESTLESS LEG SYNDROME 02/09/2008  . ALLERGIC RHINITIS 02/09/2008   Social History   Tobacco Use  . Smoking status: Former Research scientist (life sciences)  . Smokeless tobacco: Never Used  Substance Use Topics  . Alcohol use: No   Review of Systems  Constitutional: +fatigue HENT: Negative for congestion, ear pain, sinus pain and sore throat.   Eyes: Negative.   Respiratory: Negative for cough, shortness of breath and wheezing.   Cardiovascular: Negative for chest pain, palpitations and leg swelling.  Gastrointestinal: Negative for abdominal pain, diarrhea, nausea and vomiting.  Genitourinary: Negative for dysuria, frequency and urgency.  Musculoskeletal: +left rib pain, right neck and right arm pain Skin: Negative for color change, pallor and rash.  Neurological: Negative for syncope, light-headedness and headaches.  Psychiatric/Behavioral: lonely      Objective:   Physical Exam  Constitutional: She is oriented to person, place, and time. She appears well-nourished. No distress.  HENT:  Head: Normocephalic and atraumatic.  Eyes: Pupils are equal, round, and reactive to light. EOM are normal. No scleral icterus.  Neck: Neck supple. No JVD present. No tracheal deviation present.  Cardiovascular: Normal rate and regular rhythm.  Pulmonary/Chest: Effort normal and breath sounds normal. No respiratory distress. She has no wheezes. She has no  rales.  Musculoskeletal: She exhibits no edema or deformity.       Cervical back: She exhibits tenderness.       Back:       Right upper arm: She exhibits tenderness.       Arms: Gait normal  Lymphadenopathy:    She has no cervical adenopathy.  Neurological: She is alert and oriented to person, place, and time.  Skin: Skin is warm and dry. Capillary refill takes less than 2 seconds. No rash noted. No erythema. No pallor.  Psychiatric:  Seems sad when talking about missing her husband and being alone at home without many family or friends.   Nursing note and vitals reviewed.     Vitals:   07/11/18 1001  BP: 124/66  Pulse: 65  Temp: 98.3 F (36.8 C)  SpO2: 98%   Assessment & Plan:   Fatigue - unclear reason for fatigue, I suspect it could be directly related to her complicated grief and depression.  Patient however insists she is not depressed.  We will do lab work including CBC, CMP, vitamin D, B12/folate and thyroid panel to further evaluate.  I suspect a vitamin D or vitamin B12 deficiency could be partially responsible for her lower energy.  Neck pain/right  arm pain -patient agreeable to x-ray of the neck.  She declines any x-rays of her arm stating she does not need this.  Suggested we do physical therapy referral for patient to work on her neck and arm as well as helping her to get exercise.  Patient declines stating she does not have the energy to go to physical therapy.  Left rib pain-we will get new x-rays of chest and ribs.  Suggested patient may be consider seeing any chronic pain specialist or taking a medication such as a Cymbalta that can help chronic pain, but patient declines any of the suggestions.  CVA old- due to past history of CVA and patient's concerns that her right-sided neck pain could be related to carotid artery we will do carotid Doppler.  Complicated grief - Long discussion about different things she can do to help improve loneliness.  Patient declines  referral to therapist.  Patient encouraged to go out and volunteer at the blood tries with the [employer], this can be very good for her she can meet new people and also help others.  Also encourage patient to look into different medications that can help mood improved such as Cymbalta or Zoloft, the patient states she will do some research.   A total of 40  minutes were spent face-to-face with the patient during this encounter and over half of that time was spent on counseling and coordination of care. The patient was counseled on medications that could help both pain and mood.  Patient will keep follow-up as already planned in November 2019.  Patient advised she can return to clinic sooner if any issues arise

## 2018-07-12 ENCOUNTER — Encounter: Payer: Self-pay | Admitting: Family Medicine

## 2018-07-12 MED ORDER — B-12 1000 MCG PO TABS: 1.0000 | ORAL_TABLET | Freq: Every day | ORAL | 0 refills | Status: DC

## 2018-07-12 MED ORDER — VITAMIN D3 50 MCG (2000 UT) PO CAPS: 2000.0000 [IU] | ORAL_CAPSULE | Freq: Every day | ORAL | 1 refills | Status: DC

## 2018-07-12 NOTE — Addendum Note (Signed)
Addended by: Philis Nettle on: 07/12/2018 12:19 PM   Modules accepted: Orders

## 2018-07-15 ENCOUNTER — Ambulatory Visit
Admission: RE | Admit: 2018-07-15 | Discharge: 2018-07-15 | Disposition: A | Discharge status: Home / Self Care | Payer: Medicare Other | Source: Ambulatory Visit | Attending: Family Medicine | Admitting: Family Medicine

## 2018-07-15 DIAGNOSIS — I6523 Occlusion and stenosis of bilateral carotid arteries: Secondary | ICD-10-CM | POA: Diagnosis not present

## 2018-07-15 DIAGNOSIS — I63233 Cerebral infarction due to unspecified occlusion or stenosis of bilateral carotid arteries: Secondary | ICD-10-CM | POA: Diagnosis not present

## 2018-07-15 DIAGNOSIS — Z8673 Personal history of transient ischemic attack (TIA), and cerebral infarction without residual deficits: Secondary | ICD-10-CM | POA: Diagnosis not present

## 2018-07-18 ENCOUNTER — Encounter: Payer: Self-pay | Admitting: Family Medicine

## 2018-07-18 ENCOUNTER — Telehealth: Payer: Self-pay | Admitting: Family Medicine

## 2018-07-18 NOTE — Telephone Encounter (Signed)
Called Pt with the names of the 2 Rx  Lexapro and zoloft . Pt stated does Dr. Caryl Bis knows about these Rx and did you get Dr. Caryl Bis approval first. Pt also stated that she is going to contact Dr. Caryl Bis about these 2 Rx to see what he thinks about them.

## 2018-07-18 NOTE — Telephone Encounter (Signed)
There are many choices, but a good one that tends to be well tolerated by many patients is Lexapro.  Another option would be zoloft.

## 2018-07-18 NOTE — Telephone Encounter (Signed)
Copied from [name] [id number]. Topic: Quick Communication - See Telephone Encounter >> Jul 18, 2018  3:10 PM Blase Mess A wrote: CRM for notification. See Telephone encounter for: 07/18/18.  Patient is calling because she wanted know the name of medication that Lauren Guse rmentioned for sadness? She wanted to look up the names. Please advise [phone]

## 2018-08-10 ENCOUNTER — Encounter: Payer: Self-pay | Admitting: Family Medicine

## 2018-08-10 ENCOUNTER — Ambulatory Visit (INDEPENDENT_AMBULATORY_CARE_PROVIDER_SITE_OTHER): Payer: Medicare Other | Admitting: Family Medicine

## 2018-08-10 VITALS — BP 130/70 | HR 76 | Temp 98.2°F | Ht 68.0 in | Wt 199.0 lb

## 2018-08-10 DIAGNOSIS — R0781 Pleurodynia: Secondary | ICD-10-CM

## 2018-08-10 DIAGNOSIS — L749 Eccrine sweat disorder, unspecified: Secondary | ICD-10-CM | POA: Diagnosis not present

## 2018-08-10 DIAGNOSIS — R0609 Other forms of dyspnea: Secondary | ICD-10-CM | POA: Diagnosis not present

## 2018-08-10 DIAGNOSIS — F419 Anxiety disorder, unspecified: Secondary | ICD-10-CM

## 2018-08-10 DIAGNOSIS — R0789 Other chest pain: Secondary | ICD-10-CM

## 2018-08-10 DIAGNOSIS — I10 Essential (primary) hypertension: Secondary | ICD-10-CM

## 2018-08-10 DIAGNOSIS — B372 Candidiasis of skin and nail: Secondary | ICD-10-CM | POA: Diagnosis not present

## 2018-08-10 DIAGNOSIS — F329 Major depressive disorder, single episode, unspecified: Secondary | ICD-10-CM

## 2018-08-10 DIAGNOSIS — F32A Depression, unspecified: Secondary | ICD-10-CM

## 2018-08-10 LAB — CBC
HCT: 40.8 % (ref 36.0–46.0)
HEMOGLOBIN: 13.6 g/dL (ref 12.0–15.0)
MCHC: 33.4 g/dL (ref 30.0–36.0)
MCV: 87.3 fl (ref 78.0–100.0)
Platelets: 305 10*3/uL (ref 150.0–400.0)
RBC: 4.68 Mil/uL (ref 3.87–5.11)
RDW: 14 % (ref 11.5–15.5)
WBC: 9.6 10*3/uL (ref 4.0–10.5)

## 2018-08-10 LAB — COMPREHENSIVE METABOLIC PANEL
ALT: 20 U/L (ref 0–35)
AST: 19 U/L (ref 0–37)
Albumin: 4.4 g/dL (ref 3.5–5.2)
Alkaline Phosphatase: 101 U/L (ref 39–117)
BUN: 21 mg/dL (ref 6–23)
CO2: 29 mEq/L (ref 19–32)
Calcium: 9.9 mg/dL (ref 8.4–10.5)
Chloride: 103 mEq/L (ref 96–112)
Creatinine, Ser: 0.97 mg/dL (ref 0.40–1.20)
GFR: 59.91 mL/min — ABNORMAL LOW (ref >60.00)
Glucose, Bld: 86 mg/dL (ref 70–99)
Potassium: 4.7 mEq/L (ref 3.5–5.1)
Sodium: 138 mEq/L (ref 135–145)
Total Bilirubin: 0.4 mg/dL (ref 0.2–1.2)
Total Protein: 7.7 g/dL (ref 6.0–8.3)

## 2018-08-10 LAB — TSH: TSH: 2.59 u[IU]/mL (ref 0.35–4.50)

## 2018-08-10 LAB — BRAIN NATRIURETIC PEPTIDE: PRO B NATRI PEPTIDE: 31 pg/mL (ref 0.0–100.0)

## 2018-08-10 NOTE — Patient Instructions (Signed)
Nice to see you. I would like to refer you to sports medicine for your ribs.  If you are willing to do this please let us know. I would like to refer you to cardiology as well for your shortness of breath. I would like for you to try to lower the temperature at night at home and see if that helps with your sweating.  I would also like for you to check your temperature at home.   We will check some lab work today.

## 2018-08-11 ENCOUNTER — Encounter: Payer: Self-pay | Admitting: Family Medicine

## 2018-08-11 DIAGNOSIS — R0781 Pleurodynia: Secondary | ICD-10-CM | POA: Insufficient documentation

## 2018-08-11 DIAGNOSIS — B372 Candidiasis of skin and nail: Secondary | ICD-10-CM | POA: Insufficient documentation

## 2018-08-11 DIAGNOSIS — R0609 Other forms of dyspnea: Principal | ICD-10-CM | POA: Insufficient documentation

## 2018-08-11 DIAGNOSIS — L749 Eccrine sweat disorder, unspecified: Secondary | ICD-10-CM | POA: Insufficient documentation

## 2018-08-11 MED ORDER — NYSTATIN 100000 UNIT/GM EX CREA: 1.0000 "application " | TOPICAL_CREAM | Freq: Two times a day (BID) | CUTANEOUS | 0 refills | Status: DC

## 2018-08-11 NOTE — Assessment & Plan Note (Addendum)
At goal.  Continue current regimen.  Possibly with orthostasis.  She will monitor.

## 2018-08-11 NOTE — Assessment & Plan Note (Signed)
This is been going on for years.  Potentially could be an autonomic issue related to her prior stroke.  Could be related to the temperature she keeps it in the house.  I discussed checking her temperature when she has the sweating to ensure she is not having a fever.  I discussed lowering the temperature of her house to see if that would be beneficial.  She will follow-up in 1 month.

## 2018-08-11 NOTE — Assessment & Plan Note (Signed)
I suspect the rash in the crease under her left breast is related to a mild candidal infection.  She has been using a cream at home which she does not know the name.  She will contact us with the name of this.  We will then determine if it is appropriate treatment.

## 2018-08-11 NOTE — Assessment & Plan Note (Signed)
She continues to have issues with this.  I discussed starting Lexapro or Zoloft.  She has declined this though will consider it.  At this time she will continue Klonopin.  She will monitor.

## 2018-08-11 NOTE — Assessment & Plan Note (Signed)
Chronic issue.  Advised referral for further management and evaluation given persistence.  She noted she would think about this.

## 2018-08-11 NOTE — Assessment & Plan Note (Signed)
This potentially could be from deconditioning though could be a cardiac cause.  EKG is reassuring.  Discussed cardiology referral though she is unsure about this.  Will check lab work to determine if there is an underlying cause and if no cause is identified we will advise cardiology referral.

## 2018-08-11 NOTE — Progress Notes (Signed)
[name], MD Phone: [phone]  Toni Berger is a 72 y.o. female who presents today for follow-up.  CC: Anxiety/depression, hypertension, rib pain, sweating, dyspnea on exertion  Anxiety/depression: Patient notes the holidays are hard for her.  She does have some trouble sleeping.  She feels sad and lonely.  She has no one to spend the holidays with.  She does not have many people to interact with.  She did try to volunteer though removed herself from that.  She tried to reach out to some friends.  She has gone to several therapists though they have not been beneficial.  She is currently on clonazepam.  She notes no SI.  Hypertension: Not checking at home.  She is taking amlodipine and lisinopril.  She does occasionally note lightheadedness.  Dyspnea on exertion: Patient notes this has been going on for a number of months.  She will feel fatigued and tired after going out to do anything.  She does note occasional sharp quick discomforts along her left costochondral joints.  She does note at times they radiate down her right arm.  She feels it will take her breath away when it occurs.  No palpitations.  No exertional chest pain.  She is unsure how often that occurs.  Rib pain: Notes her ribs still hurt.  She has undergone CT and x-ray imaging without any significant cause noted.  She had rib fractures though those have healed.  Sweating: Patient notes for years since she had her stroke she will have sweating at night.  She will be wet and end up getting up.  She is not drenched.  She does not have to change clothes.  She notes no fevers.  She keeps the house at 58 F.  She seldom sleeps with a heavy blanket.  Patient also notes a red rash underneath her left breast that comes and goes.  She will put a topical ointment on it that she has at home and it will improve.  There is no itching.  Social History   Tobacco Use  Smoking Status Former Smoker  Smokeless Tobacco Never Used      ROS see history of present illness  Objective  Physical Exam Vitals:   08/10/18 1104  BP: 130/70  Pulse: 76  Temp: 98.2 F (36.8 C)  SpO2: 96%   Laying blood pressure 160/90 pulse 67 Sitting blood pressure 140/90 pulse 73 Standing blood pressure 160/98 pulse 75  BP Readings from Last 3 Encounters:  08/10/18 130/70  07/11/18 124/66  05/26/18 124/68   Wt Readings from Last 3 Encounters:  08/10/18 199 lb (90.3 kg)  07/11/18 198 lb 3.2 oz (89.9 kg)  05/26/18 195 lb 3.2 oz (88.5 kg)    Physical Exam  Constitutional: No distress.  Cardiovascular: Normal rate, regular rhythm and normal heart sounds.  Pulmonary/Chest: Effort normal and breath sounds normal.  Genitourinary:  Genitourinary Comments: Chaperone used, there is a mild red rash in the crease underneath her left breast, it does not involve her breast, there is no tenderness, there is no warmth, left costochondral joints tender to palpation  Musculoskeletal: She exhibits no edema.  Neurological: She is alert.  Skin: Skin is warm and dry. She is not diaphoretic.   EKG: Normal sinus rhythm, rate 61, no ST or T wave changes noted  Assessment/Plan: Please see individual problem list.  HYPERTENSION, BENIGN At goal.  Continue current regimen.  Possibly with orthostasis.  She will monitor.  Anxiety and depression She continues to  have issues with this.  I discussed starting Lexapro or Zoloft.  She has declined this though will consider it.  At this time she will continue Klonopin.  She will monitor.  Rib pain on left side Chronic issue.  Advised referral for further management and evaluation given persistence.  She noted she would think about this.  Exertional dyspnea This potentially could be from deconditioning though could be a cardiac cause.  EKG is reassuring.  Discussed cardiology referral though she is unsure about this.  Will check lab work to determine if there is an underlying cause and if no cause is  identified we will advise cardiology referral.  Candidal intertrigo I suspect the rash in the crease under her left breast is related to a mild candidal infection.  She has been using a cream at home which she does not know the name.  She will contact us with the name of this.  We will then determine if it is appropriate treatment.  Sweating abnormality This is been going on for years.  Potentially could be an autonomic issue related to her prior stroke.  Could be related to the temperature she keeps it in the house.  I discussed checking her temperature when she has the sweating to ensure she is not having a fever.  I discussed lowering the temperature of her house to see if that would be beneficial.  She will follow-up in 1 month.   Orders Placed This Encounter  Procedures  . TSH  . Comp Met (CMET)  . CBC  . B Nat Peptide  . EKG 12-Lead    No orders of the defined types were placed in this encounter.    [name], MD [place]

## 2018-08-12 ENCOUNTER — Encounter: Payer: Self-pay | Admitting: Family Medicine

## 2018-08-16 NOTE — Addendum Note (Signed)
Addended by: Leone Haven on: 08/16/2018 10:52 AM   Modules accepted: Orders

## 2018-08-18 ENCOUNTER — Other Ambulatory Visit: Payer: Self-pay | Admitting: Family Medicine

## 2018-08-19 NOTE — Telephone Encounter (Signed)
Controlled substance database reviewed. Sent to pharmacy.   

## 2018-09-14 ENCOUNTER — Other Ambulatory Visit: Payer: Self-pay | Admitting: Family Medicine

## 2018-09-22 ENCOUNTER — Ambulatory Visit (INDEPENDENT_AMBULATORY_CARE_PROVIDER_SITE_OTHER): Payer: Medicare Other | Admitting: Podiatry

## 2018-09-22 ENCOUNTER — Encounter: Payer: Self-pay | Admitting: Podiatry

## 2018-09-22 DIAGNOSIS — Q828 Other specified congenital malformations of skin: Secondary | ICD-10-CM | POA: Diagnosis not present

## 2018-09-22 DIAGNOSIS — D689 Coagulation defect, unspecified: Secondary | ICD-10-CM

## 2018-09-22 DIAGNOSIS — M79609 Pain in unspecified limb: Secondary | ICD-10-CM | POA: Diagnosis not present

## 2018-09-22 DIAGNOSIS — B351 Tinea unguium: Secondary | ICD-10-CM | POA: Diagnosis not present

## 2018-09-22 NOTE — Progress Notes (Signed)
Complaint:  Visit Type: Patient returns to my office for continued preventative foot care services. Complaint: Patient states" my nails have grown long and thick and become painful to walk and wear shoes"  The patient presents for preventative foot care services. No changes to ROS.  She says she has painful callus left foot. .  Patient is taking plavix. Podiatric Exam: Vascular: dorsalis pedis and posterior tibial pulses are palpable bilateral. Capillary return is immediate. Temperature gradient is WNL. Skin turgor WNL  Sensorium: Normal Semmes Weinstein monofilament test. Normal tactile sensation bilaterally. Nail Exam: Pt has thick disfigured discolored nails with subungual debris noted bilateral entire nail hallux through fifth toenails Ulcer Exam: There is no evidence of ulcer or pre-ulcerative changes or infection. Orthopedic Exam: Muscle tone and strength are WNL. No limitations in general ROM. No crepitus or effusions noted. Foot type and digits show no abnormalities. Bony prominences are unremarkable. Skin:  Porokeratosis sub 2 left. No infection or ulcers  Diagnosis:  Onychomycosis, , Pain in right toe, pain in left toes, Porokeratosis Treatment & Plan Procedures and Treatment: Consent by patient was obtained for treatment procedures. The patient understood the discussion of treatment and procedures well. All questions were answered thoroughly reviewed. Debridement of mycotic and hypertrophic toenails, 1 through 5 bilateral and clearing of subungual debris. No ulceration, no infection noted. Debride porokeratosis.  ABN signed for 2020. Return Visit-Office Procedure: Patient instructed to return to the office for a follow up visit 10  weeks for continued evaluation and treatment.    Gardiner Barefoot DPM

## 2018-09-27 ENCOUNTER — Telehealth: Payer: Self-pay | Admitting: Family Medicine

## 2018-09-27 NOTE — Telephone Encounter (Signed)
Copied from [place] [id number]. Topic: Quick Communication - See Telephone Encounter >> Sep 27, 2018  3:01 PM Rayann Heman wrote: CRM for notification. See Telephone encounter for: 09/27/18. Pt called and stated that she would like to know why she needs to go and see a cardiologist. Pt would like a call back regarding. Please advise

## 2018-09-28 NOTE — Telephone Encounter (Signed)
Patient is checking on the status of the 09/27/18 request.  Please return call.

## 2018-09-29 NOTE — Telephone Encounter (Signed)
Sent to PCP to advise 

## 2018-09-29 NOTE — Telephone Encounter (Signed)
The patient was reporting shortness of breath on exertion at her last office visit.  This is the reason I wanted her to see cardiology.

## 2018-09-29 NOTE — Telephone Encounter (Signed)
Called and spoke with patient. Pt would like to decline referral at this time. Pt stated that she contacted them and all they seem to be able to do for her would be an EKG. Pt stated that they told her that they can't do a stress test and the other option was a test that would increase her heart rate with an injection pt stated that she does NOT want to do that.   Pt stated that we did an EKG at our office and if it was normal she is fine to just cancel this for now and later if she feels the referral is needed she will let us know.

## 2018-10-03 ENCOUNTER — Encounter: Payer: Self-pay | Admitting: Family Medicine

## 2018-10-03 NOTE — Telephone Encounter (Signed)
Sent to PCP to advise. Next OV is scheduled for 10/24/2018

## 2018-10-17 ENCOUNTER — Other Ambulatory Visit: Payer: Self-pay | Admitting: Family Medicine

## 2018-10-17 NOTE — Telephone Encounter (Signed)
Controlled substance database reviewed. Sent to pharmacy.   

## 2018-10-24 ENCOUNTER — Encounter: Payer: Self-pay | Admitting: Family Medicine

## 2018-10-24 ENCOUNTER — Ambulatory Visit (INDEPENDENT_AMBULATORY_CARE_PROVIDER_SITE_OTHER): Payer: Medicare Other | Admitting: Family Medicine

## 2018-10-24 ENCOUNTER — Other Ambulatory Visit: Payer: Self-pay | Admitting: Family Medicine

## 2018-10-24 VITALS — BP 128/80 | HR 68 | Temp 98.1°F | Ht 68.0 in | Wt 200.8 lb

## 2018-10-24 DIAGNOSIS — Z1231 Encounter for screening mammogram for malignant neoplasm of breast: Secondary | ICD-10-CM | POA: Diagnosis not present

## 2018-10-24 DIAGNOSIS — R0609 Other forms of dyspnea: Secondary | ICD-10-CM | POA: Diagnosis not present

## 2018-10-24 DIAGNOSIS — L749 Eccrine sweat disorder, unspecified: Secondary | ICD-10-CM | POA: Diagnosis not present

## 2018-10-24 DIAGNOSIS — M545 Low back pain, unspecified: Secondary | ICD-10-CM

## 2018-10-24 DIAGNOSIS — F329 Major depressive disorder, single episode, unspecified: Secondary | ICD-10-CM | POA: Diagnosis not present

## 2018-10-24 DIAGNOSIS — F419 Anxiety disorder, unspecified: Secondary | ICD-10-CM | POA: Diagnosis not present

## 2018-10-24 DIAGNOSIS — I10 Essential (primary) hypertension: Secondary | ICD-10-CM | POA: Diagnosis not present

## 2018-10-24 DIAGNOSIS — F32A Depression, unspecified: Secondary | ICD-10-CM

## 2018-10-24 DIAGNOSIS — K449 Diaphragmatic hernia without obstruction or gangrene: Secondary | ICD-10-CM | POA: Diagnosis not present

## 2018-10-24 NOTE — Assessment & Plan Note (Signed)
Adequately controlled.  Continue current regimen. 

## 2018-10-24 NOTE — Assessment & Plan Note (Signed)
Patient continues to have some issues with this.  I advised referral to cardiology though she notes she would not undergo a nuclear stress test and would not undergo a catheterization.  I discussed having her see them to determine if a coronary calcium score would be worthwhile though she declined this as well.  I am not sure this would be of clinical utility given that I expect her score to be elevated as she likely has calcification given risk factors and previous cerebrovascular event.  She declined evaluation and opted to monitor.  I discussed the risk of this and that the benefit would be finding an abnormality when something can be done about it.

## 2018-10-24 NOTE — Assessment & Plan Note (Signed)
Rare GERD.  Discussed potentially treating with Pepcid or omeprazole though she declined and opted to continue with Tums.  I did discuss referral to ENT given her sensation of things getting stuck in her throat though she declined this as well and opted to monitor.  I think this is reasonable given the duration since her last issue with this.

## 2018-10-24 NOTE — Patient Instructions (Signed)
Nice to see you. Please monitor your hip and back.  If your pain worsens again please let us know. If you develop worsening shortness of breath please be evaluated. Please consider taking Pepcid or omeprazole over-the-counter if he have reflux.  If you start to have recurrent issues with difficulty swallowing please be evaluated.

## 2018-10-24 NOTE — Assessment & Plan Note (Signed)
Seems to be improved to some degree.  She will continue to monitor and continue with her current regimen.

## 2018-10-24 NOTE — Assessment & Plan Note (Signed)
Resolved. Monitor for recurrence. 

## 2018-10-24 NOTE — Progress Notes (Signed)
[name], MD Phone: [phone]  Toni Berger is a 73 y.o. female who presents today for follow-up.  CC: Right hip pain, anxiety/depression, hypertension, dyspnea on exertion, GERD, sweating  Right hip pain: Patient notes right hip and right lower back discomfort that has actually improved since she messaged [place] previously regarding this.  She notes no injury.  No radiation.  No numbness.  She notes some subjective weakness when she stands up.  She notes no bowel or bladder incontinence.  Anxiety/depression: She notes sometimes this is better.  When she feels overwhelmed she will sit down and think about the good times with her husband.  She takes Klonopin mostly at night.  There is no alcohol use with this or drowsiness.  No SI.  She is dealing with living by herself a little bit better.  Hypertension: Not consistently checking this at home.  Taking amlodipine and lisinopril.  No chest pain or edema.  Dyspnea on exertion: She notes continued issues with this.  Described as huffing and puffing after walking uphill.  She will rest and this will improve.  She does note some fatigue and dyspnea when persistently doing activities around the house.  No chest pain.  She was recommended to see cardiology previously though she declined this as she does not want a stress test or a catheterization.  GERD: She notes occasional burning symptoms.  She notes this depends on what she eats.  No blood in her stool.  She will take Tums with good benefit.  She notes in the distant past she would have issues with difficulty swallowing and feeling as though things were getting stuck in the back of her throat though she has not had this in some time.  Sweating abnormality: She has had no additional sweating.  Social History   Tobacco Use  Smoking Status Former Smoker  Smokeless Tobacco Never Used     ROS see history of present illness  Objective  Physical Exam Vitals:   10/24/18 1413  BP:  128/80  Pulse: 68  Temp: 98.1 F (36.7 C)  SpO2: 95%    BP Readings from Last 3 Encounters:  10/24/18 128/80  08/10/18 130/70  07/11/18 124/66   Wt Readings from Last 3 Encounters:  10/24/18 200 lb 12.8 oz (91.1 kg)  08/10/18 199 lb (90.3 kg)  07/11/18 198 lb 3.2 oz (89.9 kg)    Physical Exam Constitutional:      General: She is not in acute distress.    Appearance: She is not diaphoretic.  Cardiovascular:     Rate and Rhythm: Normal rate and regular rhythm.     Heart sounds: Normal heart sounds.  Pulmonary:     Effort: Pulmonary effort is normal.     Breath sounds: Normal breath sounds.  Abdominal:     General: Bowel sounds are normal. There is no distension.     Palpations: Abdomen is soft.     Tenderness: There is no abdominal tenderness. There is no guarding or rebound.  Musculoskeletal:     Comments: No midline spine tenderness, no midline spine step-off, no muscular back tenderness, slight discomfort over her right greater trochanter, no discomfort on internal or external range of motion right hip  Skin:    General: Skin is warm and dry.  Neurological:     Mental Status: She is alert.     Comments: 5/5 strength bilateral quads, hamstrings, plantar flexion, and dorsiflexion, sensation light touch intact bilateral lower extremities, 2+ patellar reflexes  Assessment/Plan: Please see individual problem list.  Right low back pain Improving.  Associated with right hip pain.  She has a relatively benign exam today.  Discussed x-ray imaging of her hip and back though she deferred and opted to monitor.  If she has worsening symptoms she will contact us.  Anxiety and depression Seems to be improved to some degree.  She will continue to monitor and continue with her current regimen.  Sweating abnormality Resolved.  Monitor for recurrence.  Exertional dyspnea Patient continues to have some issues with this.  I advised referral to cardiology though she notes she  would not undergo a nuclear stress test and would not undergo a catheterization.  I discussed having her see them to determine if a coronary calcium score would be worthwhile though she declined this as well.  I am not sure this would be of clinical utility given that I expect her score to be elevated as she likely has calcification given risk factors and previous cerebrovascular event.  She declined evaluation and opted to monitor.  I discussed the risk of this and that the benefit would be finding an abnormality when something can be done about it.  Hiatal hernia Rare GERD.  Discussed potentially treating with Pepcid or omeprazole though she declined and opted to continue with Tums.  I did discuss referral to ENT given her sensation of things getting stuck in her throat though she declined this as well and opted to monitor.  I think this is reasonable given the duration since her last issue with this.  HYPERTENSION, BENIGN Adequately controlled.  Continue current regimen.   Health Maintenance: Mammogram ordered.  Patient will call to schedule.  Orders Placed This Encounter  Procedures  . MM 3D SCREEN BREAST BILATERAL    Standing Status:   Future    Standing Expiration Date:   12/23/2019    Order Specific Question:   Reason for Exam (SYMPTOM  OR DIAGNOSIS REQUIRED)    Answer:   breast cancer screening    Order Specific Question:   Preferred imaging location?    Answer:   [facility]    No orders of the defined types were placed in this encounter.    [name], MD [place]

## 2018-10-24 NOTE — Assessment & Plan Note (Signed)
Improving.  Associated with right hip pain.  She has a relatively benign exam today.  Discussed x-ray imaging of her hip and back though she deferred and opted to monitor.  If she has worsening symptoms she will contact us.

## 2018-10-25 ENCOUNTER — Ambulatory Visit: Payer: Self-pay | Admitting: Cardiovascular Disease

## 2018-11-22 ENCOUNTER — Encounter: Payer: Self-pay | Admitting: Family Medicine

## 2018-11-22 NOTE — Telephone Encounter (Signed)
Sent to PCP as an FYI  

## 2018-12-08 ENCOUNTER — Ambulatory Visit: Payer: Medicare Other | Admitting: Podiatry

## 2018-12-12 ENCOUNTER — Other Ambulatory Visit: Payer: Self-pay | Admitting: Family Medicine

## 2018-12-23 ENCOUNTER — Encounter: Payer: Self-pay | Admitting: Family Medicine

## 2018-12-26 NOTE — Telephone Encounter (Signed)
Sent to PCP to advise   Last OV 10/24/2018  Last refilled 12/12/2018 disp 90 with 1 refill   Sig: TAKE 1 TABLET BY MOUTH EVERY 8 TO 12 HOURS AS NEEDED FOR ANXIETY (MAX 3 DOSES IN 24 HOURS)  Next appt 02/22/2019

## 2018-12-28 ENCOUNTER — Other Ambulatory Visit: Payer: Self-pay

## 2018-12-28 NOTE — Telephone Encounter (Signed)
Last OV 10/24/2018  Last refilled 12/02/2018 disp 90 with 1 refill   Next OV 02/22/2019  Sent to PCP for approval   Per karen she called pt to have pt come in soon and pt was crying on the phone dealing with high anxiety and requesting for clonazepam to be refilled

## 2018-12-28 NOTE — Telephone Encounter (Signed)
It appears she just had this refilled on 12/14/2018.  She should not need a refill at this time.  Please contact the patient and see if she has medication left.  Please contact the patient's pharmacy as well to see when she last filled this.  She additionally sent a MyChart message previously asking to increase the frequency of this medication.  I will not increase the frequency.  If her anxiety has worsened to the extent she feels like she needs more than we need to add an additional medication to her regimen.

## 2018-12-30 ENCOUNTER — Telehealth: Payer: Self-pay

## 2018-12-30 NOTE — Telephone Encounter (Signed)
We can try Remeron though there is not a specific indication for anxiety with this medication.  I would prefer to use an SSRI.  Please find out why she would not like to use an SSRI.  Additionally she is not on Xanax.  Please clarify if this was a refill request for clonazepam.

## 2018-12-30 NOTE — Telephone Encounter (Signed)
We revived a refill for [facility] from [facility]   Called and spoke with pt. Pt stated that she did not request this refill she did pick up the Rx on 12/14/2018 and does not need refills at this time.   Pt stated that she is willing to consider medication Remeron this is not an SSRI medication.   Pt wrote you on mychart and wait for you to respond to her questions.

## 2018-12-30 NOTE — Telephone Encounter (Signed)
Were you able to speak to the patient regarding my prior message?

## 2019-01-02 NOTE — Telephone Encounter (Signed)
Sent patient a mychart message will call tomorrow if I do not hear back from her.

## 2019-01-03 NOTE — Telephone Encounter (Signed)
Sent to PCP ?

## 2019-01-03 NOTE — Telephone Encounter (Signed)
Pt responded through mychart sent to PCP as an [place]

## 2019-01-12 ENCOUNTER — Ambulatory Visit (INDEPENDENT_AMBULATORY_CARE_PROVIDER_SITE_OTHER): Payer: Medicare Other | Admitting: Podiatry

## 2019-01-12 ENCOUNTER — Encounter: Payer: Self-pay | Admitting: Podiatry

## 2019-01-12 ENCOUNTER — Other Ambulatory Visit: Payer: Self-pay

## 2019-01-12 VITALS — Temp 97.5°F

## 2019-01-12 DIAGNOSIS — Q828 Other specified congenital malformations of skin: Secondary | ICD-10-CM | POA: Diagnosis not present

## 2019-01-12 DIAGNOSIS — M79676 Pain in unspecified toe(s): Secondary | ICD-10-CM

## 2019-01-12 DIAGNOSIS — B351 Tinea unguium: Secondary | ICD-10-CM | POA: Diagnosis not present

## 2019-01-12 DIAGNOSIS — D689 Coagulation defect, unspecified: Secondary | ICD-10-CM | POA: Diagnosis not present

## 2019-01-12 DIAGNOSIS — M79609 Pain in unspecified limb: Principal | ICD-10-CM

## 2019-01-12 NOTE — Progress Notes (Signed)
Complaint:  Visit Type: Patient returns to my office for continued preventative foot care services. Complaint: Patient states" my nails have grown long and thick and become painful to walk and wear shoes"  The patient presents for preventative foot care services. No changes to ROS.  She says she has painful callus left foot. .  Patient is taking plavix. Podiatric Exam: Vascular: dorsalis pedis and posterior tibial pulses are palpable bilateral. Capillary return is immediate. Temperature gradient is WNL. Skin turgor WNL  Sensorium: Normal Semmes Weinstein monofilament test. Normal tactile sensation bilaterally. Nail Exam: Pt has thick disfigured discolored nails with subungual debris noted bilateral entire nail hallux through fifth toenails Ulcer Exam: There is no evidence of ulcer or pre-ulcerative changes or infection. Orthopedic Exam: Muscle tone and strength are WNL. No limitations in general ROM. No crepitus or effusions noted. Foot type and digits show no abnormalities. Bony prominences are unremarkable. Skin:  Porokeratosis sub 2 left. No infection or ulcers  Diagnosis:  Onychomycosis, , Pain in right toe, pain in left toes, Porokeratosis Treatment & Plan Procedures and Treatment: Consent by patient was obtained for treatment procedures. The patient understood the discussion of treatment and procedures well. All questions were answered thoroughly reviewed. Debridement of mycotic and hypertrophic toenails, 1 through 5 bilateral and clearing of subungual debris. No ulceration, no infection noted. Debride porokeratosis.   Debride heel callus. Return Visit-Office Procedure: Patient instructed to return to the office for a follow up visit 10  weeks for continued evaluation and treatment.    Gardiner Barefoot DPM

## 2019-01-18 ENCOUNTER — Encounter: Payer: Self-pay | Admitting: Family Medicine

## 2019-01-18 NOTE — Telephone Encounter (Signed)
Sent to PCP ?

## 2019-01-19 ENCOUNTER — Encounter: Payer: Self-pay | Admitting: Family Medicine

## 2019-01-19 NOTE — Telephone Encounter (Signed)
Please see the patients most recent mychart message.

## 2019-01-26 ENCOUNTER — Telehealth: Payer: Self-pay

## 2019-01-26 NOTE — Telephone Encounter (Signed)
Copied from [place] [id number]. Topic: General - Inquiry >> Jan 25, 2019  3:45 PM Rutherford Nail, [place] wrote: Reason for CRM: Patient calling and would like to know if Dr Caryl Bis is okay with pushing her 02/22/2019 appointment out a little ways? States that she is doing okay and does not have any concerns at this time. States that she does not wish to be on camera for a virtual visit and will not being into the office. States that she is being very precautious and is wearing gloves and a mask anytime she is out and about. CB#: [phone]

## 2019-01-26 NOTE — Telephone Encounter (Signed)
Called and left a message on voicemail for the pt to return a call to reschedule her appointment for 1 month out per dr. Ron Agee.  Nakeem Murnane,cma

## 2019-01-26 NOTE — Telephone Encounter (Signed)
That is fine with me. It can be pushed out another month.

## 2019-02-02 ENCOUNTER — Other Ambulatory Visit: Payer: Self-pay | Admitting: Family Medicine

## 2019-02-03 ENCOUNTER — Telehealth: Payer: Self-pay

## 2019-02-03 NOTE — Telephone Encounter (Signed)
Last OV 2/102020  Last refilled  clonazePAM (KLONOPIN) 1 MG tablet 90 tablet 1 12/12/2018     Next OV 03/24/2019   Pt does not have enough to last her until PCP returns

## 2019-02-03 NOTE — Telephone Encounter (Signed)
Call pt  Inform her that I sent in 7 day supply of klonopin as couldn't reach her Friday afternoon and did want her out of medication  I left a vms  See  My notes below as thought refill was too early. Please clarify with patient how she is taking

## 2019-02-03 NOTE — Telephone Encounter (Signed)
   Call pt  Need to clarify here.   She picked up 90 tabs on 01/12/19. She would have 8 days left.   Does she not?  looked up patient on [place] Controlled Substances Reporting System and saw no activity that raised concern of inappropriate use.

## 2019-02-03 NOTE — Telephone Encounter (Signed)
Copied from [place] [phone]. Topic: Appointment Scheduling - Scheduling Inquiry for Clinic >> Feb 03, 2019 11:27 AM Oneta Rack wrote: Patient has an upcoming appointment on 7/10/20202 and wanted to know if PCP wanted labs done prior to appointment. Patient was extremely emotional stating this will be 3 years since the passing of her spouse and she has just gotten use to socializing and going outside and then COVID happen. Informed patient if there's anything she needs please reach out and we are right here with her.

## 2019-02-07 NOTE — Telephone Encounter (Signed)
We can plan on doing labs at the time of her visit.  Thanks.

## 2019-02-09 ENCOUNTER — Encounter: Payer: Self-pay | Admitting: Family Medicine

## 2019-02-09 ENCOUNTER — Telehealth: Payer: Self-pay | Admitting: Family Medicine

## 2019-02-09 NOTE — Telephone Encounter (Unsigned)
Copied from [place] [phone]. Topic: Quick Communication - Rx Refill/Question >> Feb 09, 2019 11:13 AM Celene Kras A wrote: Medication: clonazePAM (KLONOPIN) 1 MG tablet  Has the patient contacted their pharmacy? Yes.  Pt called stating she is very upset about her medication only having a week supply. Pt made a remark stating "if he is going to keep doing this im not going to be here for long" I am not sure what the comment in reference to. Pt is requesting to get a call back because she is very upset. Please advise.  (Agent: If no, request that the patient contact the pharmacy for the refill.) (Agent: If yes, when and what did the pharmacy advise?)  Preferred Pharmacy (with phone number or street name): [id number] [place], [address] [place] [address] Phone: [phone] Fax: [phone] Not a 24 hour pharmacy; exact hours not known.    Agent: Please be advised that RX refills may take up to 3 business days. We ask that you follow-up with your pharmacy.

## 2019-02-09 NOTE — Telephone Encounter (Signed)
Last OV 10/24/18 ok to fill?

## 2019-02-10 MED ORDER — CLONAZEPAM 1 MG PO TABS: ORAL_TABLET | ORAL | 0 refills | Status: DC

## 2019-02-10 NOTE — Telephone Encounter (Signed)
This was filled by Joycelyn Schmid while I was out.  She filled this for 1 week.  I have sent a message to the patient regarding this in response to her MyChart messages.  I sent a refill into her pharmacy today to be filled on 02/16/2019.

## 2019-02-22 ENCOUNTER — Ambulatory Visit: Payer: Self-pay | Admitting: Family Medicine

## 2019-02-22 ENCOUNTER — Ambulatory Visit: Payer: Medicare Other | Admitting: Family Medicine

## 2019-03-06 ENCOUNTER — Telehealth: Payer: Self-pay | Admitting: Family Medicine

## 2019-03-06 ENCOUNTER — Telehealth: Payer: Self-pay

## 2019-03-06 NOTE — Telephone Encounter (Signed)
Patient did not want the visit at first. State she will endure the pain.  Patient changed her mind will except a phone visit with the provider.

## 2019-03-06 NOTE — Telephone Encounter (Signed)
Returned patient's call to cancel appt.  Pt requested to cancel her appt scheduled for tomorrow (6/23) @ 9:00 am. Pt. said that she felt she should come into the office to see the doctor and not have an appt by phone.  Offered to check with PCP to see if he would be ok with patient coming into office for appt given she did not have any sick or COVID symptoms.  Patient declined and said that she was taking advil for pain and would call back if she needed to schedule an another appt.    Patient began crying while on the phone because of missing her husband who she said died 3 years ago.  I asked patient if she had gone to grief counseling.  Patient said that she has seen 2 different grief counseling and declined my offer to send a request to her PCP in regards to a referral.  Patient said she would be ok after taking a hot shower which usually helps her.    Advised patient to call back if she needed anything or if she wanted to reschedule her appointment.

## 2019-03-06 NOTE — Telephone Encounter (Signed)
Copied from [place] [phone]. Topic: Appointment Scheduling - Scheduling Inquiry for Clinic >> Mar 06, 2019  2:49 PM Lennox Solders wrote: Reason for CRM: pt is calling to cancel her appt tomorrow

## 2019-03-07 ENCOUNTER — Ambulatory Visit: Payer: Medicare Other | Admitting: Family Medicine

## 2019-03-08 ENCOUNTER — Encounter: Payer: Self-pay | Admitting: Family Medicine

## 2019-03-10 ENCOUNTER — Other Ambulatory Visit: Payer: Self-pay

## 2019-03-11 ENCOUNTER — Encounter: Payer: Self-pay | Admitting: Family Medicine

## 2019-03-13 ENCOUNTER — Emergency Department: Payer: Medicare Other

## 2019-03-13 ENCOUNTER — Other Ambulatory Visit: Payer: Self-pay

## 2019-03-13 ENCOUNTER — Emergency Department
Admission: EM | Admit: 2019-03-13 | Discharge: 2019-03-13 | Disposition: A | Discharge status: Home / Self Care | Payer: Medicare Other | Attending: Student in an Organized Health Care Education/Training Program | Admitting: Student in an Organized Health Care Education/Training Program

## 2019-03-13 ENCOUNTER — Encounter: Payer: Self-pay | Admitting: Family Medicine

## 2019-03-13 ENCOUNTER — Encounter: Payer: Self-pay | Admitting: Emergency Medicine

## 2019-03-13 ENCOUNTER — Other Ambulatory Visit: Payer: Self-pay | Admitting: Family Medicine

## 2019-03-13 ENCOUNTER — Ambulatory Visit: Payer: Self-pay

## 2019-03-13 DIAGNOSIS — E785 Hyperlipidemia, unspecified: Secondary | ICD-10-CM | POA: Diagnosis not present

## 2019-03-13 DIAGNOSIS — Z79899 Other long term (current) drug therapy: Secondary | ICD-10-CM | POA: Diagnosis not present

## 2019-03-13 DIAGNOSIS — M25552 Pain in left hip: Secondary | ICD-10-CM | POA: Diagnosis not present

## 2019-03-13 DIAGNOSIS — M7062 Trochanteric bursitis, left hip: Secondary | ICD-10-CM | POA: Diagnosis not present

## 2019-03-13 DIAGNOSIS — I10 Essential (primary) hypertension: Secondary | ICD-10-CM | POA: Diagnosis not present

## 2019-03-13 DIAGNOSIS — Y9389 Activity, other specified: Secondary | ICD-10-CM | POA: Diagnosis not present

## 2019-03-13 MED ORDER — TRAMADOL HCL 50 MG PO TABS: 50.0000 mg | ORAL_TABLET | Freq: Four times a day (QID) | ORAL | 0 refills | Status: DC | PRN

## 2019-03-13 MED ORDER — PREDNISONE 20 MG PO TABS
60.0000 mg | ORAL_TABLET | Freq: Once | ORAL | Status: AC
  Administered 2019-03-13: 60 mg via ORAL
  Filled 2019-03-13: qty 3

## 2019-03-13 MED ORDER — PREDNISONE 50 MG PO TABS: 50.0000 mg | ORAL_TABLET | Freq: Every day | ORAL | 0 refills | Status: DC

## 2019-03-13 NOTE — ED Notes (Signed)

## 2019-03-13 NOTE — Telephone Encounter (Signed)
Pt called crying because of left hip pain that causes her to be to weak to walk.  She describes the pain as excruciating. That does not permit her to go to the office. She is calling to cancel her appointment. Pt states she is taking a lot of aleve to just try to make the pain barible. Pt states that she can't make and appointment today. She refused to allow EMS. Call placed to office. Per donna note will be routed and patient will be called with Dr Ellen Henri advice.  Reason for Disposition . [1] SEVERE pain (e.g., excruciating, unable to do any normal activities) AND [2] not improved after 2 hours of pain medicine  Answer Assessment - Initial Assessment Questions 1. LOCATION and RADIATION: "Where is the pain located?"      Hip left  2. QUALITY: "What does the pain feel like?"  (e.g., sharp, dull, aching, burning)     constant 3. SEVERITY: "How bad is the pain?" "What does it keep you from doing?"   (Scale 1-10; or mild, moderate, severe)   -  MILD (1-3): doesn't interfere with normal activities    -  MODERATE (4-7): interferes with normal activities (e.g., work or school) or awakens from sleep, limping    -  SEVERE (8-10): excruciating pain, unable to do any normal activities, unable to walk     severe 4. ONSET: "When did the pain start?" "Does it come and go, or is it there all the time?"    2 weeks  5. WORK OR EXERCISE: "Has there been any recent work or exercise that involved this part of the body?"      no 6. CAUSE: "What do you think is causing the hip pain?"     unsure 7. AGGRAVATING FACTORS: "What makes the hip pain worse?" (e.g., walking, climbing stairs, running)    Walking and moving 8. OTHER SYMPTOMS: "Do you have any other symptoms?" (e.g., back pain, pain shooting down leg,  fever, rash)     Pain shoots down leg,  Protocols used: HIP PAIN-A-AH

## 2019-03-13 NOTE — ED Triage Notes (Signed)
Pt arrives with complaints of left hip pain that started 1 week prior. Pt denies injury. No shortening or rotation in triage.

## 2019-03-13 NOTE — Telephone Encounter (Signed)
Called and spoke with patient crying on phone she thought office was calling EMS non- emergent, I called EMS non-emergent and ask no sirens thye advised they would not at patient request, remained on phone with patient til she heard someone at door.

## 2019-03-13 NOTE — ED Provider Notes (Signed)
[facility] Emergency Department Provider Note  ____________________________________________  Time seen: Approximately 4:23 PM  I have reviewed the triage vital signs and the nursing notes.   HISTORY  Chief Complaint Hip Pain    HPI Toni Berger is a 73 y.o. female who presents the emergency department complaining of left hip pain after her leg gave out from underneath her.  Patient reports that this happened approximately a week ago.  She did not fall when this occurred.  Since then she has had pain to the lateral hip with occasional pain to the anterior left thigh.  Patient is still able to ambulate but is doing so with a walker due to pain.  No radicular symptoms.  No bowel or bladder dysfunction, saddle anesthesia or paresthesias.  No GI or urinary complaints at this time.  Patient has been taking Advil with no significant relief.  No complaint at this time.         Past Medical History:  Diagnosis Date  . Allergy   . Anxiety   . Arthritis   . Fibrocystic breast   . Hyperlipidemia   . Hypertension   . Lipoma    5/01 Abdominal wall lipoma  . Loss of consciousness ([place]) 12/15/2015  . Migraines   . Osteoporosis, unspecified   . Restless leg syndrome   . Stroke (cerebrum) [facility])     Patient Active Problem List   Diagnosis Date Noted  . Right low back pain 10/24/2018  . Exertional dyspnea 08/11/2018  . Rib pain on left side 08/11/2018  . Candidal intertrigo 08/11/2018  . Sweating abnormality 08/11/2018  . Adrenal adenoma, left 04/23/2018  . Hiatal hernia 04/23/2018  . Aortic atherosclerosis ([place]) 04/23/2018  . Cramps of lower extremity 07/15/2017  . Fatigue 07/15/2017  . Decreased pedal pulses 07/30/2016  . Anxiety and depression 04/29/2016  . Ankle edema 03/23/2016  . Hyperlipidemia 01/09/2016  . Closed rib fracture 01/09/2016  . Remote lacunar infarction ([place]) 12/15/2015  . Rotator cuff impingement syndrome 12/15/2015  . Cerebrovascular  accident, old 10/29/2015  . HYPERTENSION, BENIGN 11/12/2010  . OSTEOARTHRITIS 08/13/2010  . 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 Anxiety 02/09/2008  . RESTLESS LEG SYNDROME 02/09/2008  . ALLERGIC RHINITIS 02/09/2008    Past Surgical History:  Procedure Laterality Date  . APPENDECTOMY  1999  . BREAST CYST ASPIRATION    . CHOLECYSTECTOMY  1999  . DOBUTAMINE STRESS ECHO  06/96   negative  . TONSILLECTOMY AND ADENOIDECTOMY  1974  . TOTAL ABDOMINAL HYSTERECTOMY W/ BILATERAL SALPINGOOPHORECTOMY  1981    Prior to Admission medications   Medication Sig Start Date End Date Taking? Authorizing Provider  amLODipine (NORVASC) 5 MG tablet TAKE 1 TABLET DAILY 09/15/18   Leone Haven, MD  Cholecalciferol (VITAMIN D3) 2000 units capsule Take 1 capsule (2,000 Units total) by mouth daily. 07/12/18   Guse, Jacquelynn Cree, FNP  clonazePAM (KLONOPIN) 1 MG tablet TAKE 1 TABLET BY MOUTH EVERY 8 TO 12 HOURS AS NEEDED FOR ANXIETY (MAX 3 DOSES IN 24 HOURS) 02/16/19   Leone Haven, MD  clopidogrel (PLAVIX) 75 MG tablet TAKE 1 TABLET DAILY 09/15/18   Leone Haven, MD  Cyanocobalamin (B-12) 1000 MCG TABS Take 1 tablet by mouth daily. 07/12/18   Jodelle Green, FNP  ibuprofen (ADVIL) 200 MG tablet Take 200 mg by mouth every 6 (six) hours as needed.      [provider]  lisinopril (PRINIVIL,ZESTRIL) 40 MG tablet TAKE 1 TABLET DAILY 06/29/18  Leone Haven, MD  predniSONE (DELTASONE) 50 MG tablet Take 1 tablet (50 mg total) by mouth daily with breakfast. 03/13/19   Cuthriell, Charline Bills, PA-C  traMADol (ULTRAM) 50 MG tablet Take 1 tablet (50 mg total) by mouth every 6 (six) hours as needed. 03/13/19   Cuthriell, Charline Bills, PA-C    Allergies Acetaminophen, Carbidopa-levodopa, Ciprofloxacin, Metoprolol, Povidone-iodine, Pravastatin, Propoxyphene hcl, Sulfonamide derivatives, and Tetracycline  Family History  Problem Relation Age of Onset  . Heart disease Mother   . Diabetes Mother   .  Aneurysm Mother        AAA  . Heart failure Mother   . Heart failure Father   . Coronary artery disease Father   . Colon cancer Neg Hx   . Breast cancer Neg Hx     Social History Social History   Tobacco Use  . Smoking status: Former Research scientist (life sciences)  . Smokeless tobacco: Never Used  Substance Use Topics  . Alcohol use: No  . Drug use: No     Review of Systems  Constitutional: No fever/chills Eyes: No visual changes. No discharge ENT: No upper respiratory complaints. Cardiovascular: no chest pain. Respiratory: no cough. No SOB. Gastrointestinal: No abdominal pain.  No nausea, no vomiting.  No diarrhea.  No constipation. Genitourinary: Negative for dysuria. No hematuria Musculoskeletal: Positive for left lateral hip pain Skin: Negative for rash, abrasions, lacerations, ecchymosis. Neurological: Negative for headaches, focal weakness or numbness. 10-point ROS otherwise negative.  ____________________________________________   PHYSICAL EXAM:  VITAL SIGNS: ED Triage Vitals  Enc Vitals Group     BP 03/13/19 1602 (!) 156/87     Pulse Rate 03/13/19 1602 73     Resp 03/13/19 1602 19     Temp 03/13/19 1602 98.2 F (36.8 C)     Temp Source 03/13/19 1602 Oral     SpO2 03/13/19 1602 99 %     Weight 03/13/19 1603 197 lb (89.4 kg)     Height 03/13/19 1603 5\' 8"  (1.727 m)     Head Circumference --      Peak Flow --      Pain Score 03/13/19 1608 9     Pain Loc --      Pain Edu? --      Excl. in [place]? --      Constitutional: Alert and oriented. Well appearing and in no acute distress. Eyes: Conjunctivae are normal. PERRL. EOMI. Head: Atraumatic. Neck: No stridor.    Cardiovascular: Normal rate, regular rhythm. Normal S1 and S2.  Good peripheral circulation. Respiratory: Normal respiratory effort without tachypnea or retractions. Lungs CTAB. Good air entry to the bases with no decreased or absent breath sounds. Gastrointestinal: Bowel sounds 4 quadrants. Soft and nontender to  palpation. No guarding or rigidity. No palpable masses. No distention. No CVA tenderness. Musculoskeletal: Full range of motion to all extremities. No gross deformities appreciated.  Visualization of the left hip reveals no gross deformity.  No shortening or rotation of the left lower extremity.  Patient is able to move the hip appropriately.  She is able to ambulate on scene.  Patient is very tender to palpation over the greater trochanter.  No palpable abnormality or deficit.  No other palpable areas of tenderness.  No palpable abnormalities.  Examination of the lumbar spine and knee is unremarkable.  Dorsalis pedis pulse intact distally.  Sensation intact distally. Neurologic:  Normal speech and language. No gross focal neurologic deficits are appreciated.  Skin:  Skin is warm, dry  and intact. No rash noted. Psychiatric: Mood and affect are normal. Speech and behavior are normal. Patient exhibits appropriate insight and judgement.   ____________________________________________   LABS (all labs ordered are listed, but only abnormal results are displayed)  Labs Reviewed - No data to display ____________________________________________  EKG   ____________________________________________  RADIOLOGY I personally viewed and evaluated these images as part of my medical decision making, as well as reviewing the written report by the radiologist.  Dg Hip Unilat With Pelvis 2-3 Views Left  Result Date: 03/13/2019 CLINICAL DATA:  Left-sided hip pain for 1 week, no known injury, initial encounter EXAM: DG HIP (WITH OR WITHOUT PELVIS) 3V LEFT COMPARISON:  None. FINDINGS: Pelvic ring is intact. No acute fracture or dislocation is seen. No soft tissue abnormality is noted. Mild degenerative changes are noted. IMPRESSION: No acute abnormality seen. Electronically Signed   By: Inez Catalina M.D.   On: 03/13/2019 16:48    ____________________________________________    PROCEDURES  Procedure(s)  performed:    Procedures    Medications  predniSONE (DELTASONE) tablet 60 mg (has no administration in time range)     ____________________________________________   INITIAL IMPRESSION / ASSESSMENT AND PLAN / ED COURSE  Pertinent labs & imaging results that were available during my care of the patient were reviewed by me and considered in my medical decision making (see chart for details).  Review of the  [place]RS was performed in accordance of the [place] prior to dispensing any controlled drugs.           Patient's diagnosis is consistent with trochanteric bursitis of the left hip.  Patient presents the emergency department with ongoing left hip pain after her leg "gave out" from underneath her.  This did not cause her to fall.  No other injuries are reported.  No radicular symptoms.  No bowel or bladder dysfunction.  X-ray reveals no acute osseous abnormality.  Given patient's pain location, description of pain, differential included fracture, dislocation, muscle strain, bursitis.  Patient will be treated with [place] course of steroid and very limited pain medication.  Follow-up with orthopedics if symptoms do not improve.. Patient is given ED precautions to return to the ED for any worsening or new symptoms.     ____________________________________________  FINAL CLINICAL IMPRESSION(S) / ED DIAGNOSES  Final diagnoses:  Trochanteric bursitis of left hip      NEW MEDICATIONS STARTED DURING THIS VISIT:  ED Discharge Orders         Ordered    predniSONE (DELTASONE) 50 MG tablet  Daily with breakfast     03/13/19 1746    traMADol (ULTRAM) 50 MG tablet  Every 6 hours PRN     03/13/19 1746              This chart was dictated using voice recognition software/Dragon. Despite best efforts to proofread, errors can occur which can change the meaning. Any change was purely unintentional.    Darletta Moll, PA-C 03/13/19 1748    Merlyn Lot, MD 03/13/19  [phone]

## 2019-03-13 NOTE — Telephone Encounter (Addendum)
Called and spoke to patient.  Patient said that she is in a lot of pain caused by her hip.  Patient said that pain started about 1 1/2 weeks ago when she went to the grocery store to pick up groceries.  Patient c/o left hip pain radiating down her left leg.  Patient said that pain right now is an 8 out of 10 but only because she has taken Advil.  Pt said that pain is usually so unbearable that she cannot sit down and usually yells when she has to sit on the toilet.  Patient said that she is taking Advil every time she wakes up and is unsure exactly how many Advil pills she has take but knows it has been a lot.  Patient said that she has taken so many that she will probably kill herself is she continues to take Advil but the pain is unbearable without taking the Advil.  Patient said that nothing is helping the pain.  Patient said that she just took 2 more 200 mg tabs of Advil.  Patient says that she found her deceased husband's old walker and has been using the walker to get around the house and is unable to stand or move without it.  Pt said that it took her 1 1/2 hours to get out of the bed to get dressed.  Patient denies having any numbness in legs, arms or face, chest pains no shortness of breath.  Advised patient to go to ED.  Patient said that she had no one to take her to ED except for her POA who she has contacted.  Offered to call EMS for patient.  Patient wants to get a recommendation from Dr. Caryl Bis on whether or not to go to ED.  Patient said that she will go to ED if PCP advises her to do so.  Informed patient that notes will be forwarded to PCP for his review and someone from the office will call her back with PCP's recommendation.

## 2019-03-14 ENCOUNTER — Ambulatory Visit: Payer: Medicare Other | Admitting: Family Medicine

## 2019-03-14 ENCOUNTER — Encounter: Payer: Self-pay | Admitting: Family Medicine

## 2019-03-14 ENCOUNTER — Other Ambulatory Visit: Payer: Self-pay | Admitting: Family Medicine

## 2019-03-15 ENCOUNTER — Ambulatory Visit: Payer: Self-pay | Admitting: *Deleted

## 2019-03-15 ENCOUNTER — Telehealth: Payer: Self-pay | Admitting: Family Medicine

## 2019-03-15 ENCOUNTER — Encounter: Payer: Self-pay | Admitting: Family Medicine

## 2019-03-15 NOTE — Telephone Encounter (Signed)
   Reason for Disposition . Caller has NON-URGENT medication question about med that PCP prescribed and triager unable to answer question  Answer Assessment - Initial Assessment Questions 1. SYMPTOMS: "Do you have any symptoms?"     yes 2. SEVERITY: If symptoms are present, ask "Are they mild, moderate or severe?"     Mild to moderate  Protocols used: MEDICATION QUESTION CALL-A-AH  Pt called requesting that she had had a reaction to taking prednisone. She had been given a dose of 60 mg when she was in the ED and stated after taking it, she became fidgety and when she tried to sleep she  felt like her throat was getting sore and then felt like it was closing up. She felt like she had some swelling. Her throat is feeling better today. She sent her pcp 3 MyChart messages and he advised her to make a virtual appointment.  She was advised to talk with her pharmacist to see if there was another medication that she could take.  I advised her that I would send this message to her pcp and have the office call her back tomorrow. She disconnected the call. Routing to LB at [facility] for review and recommendation.

## 2019-03-16 ENCOUNTER — Encounter: Payer: Self-pay | Admitting: Family Medicine

## 2019-03-16 ENCOUNTER — Other Ambulatory Visit: Payer: Self-pay | Admitting: Family Medicine

## 2019-03-16 NOTE — Telephone Encounter (Signed)
Called patient.  Patient said that she was on hold with the pharmacy on the other line.  Told patient that I would give her a call back in about 10 minutes.

## 2019-03-16 NOTE — Telephone Encounter (Signed)
Patient is inquiring again about earlier request for refill on Klonopin.  Patient said that she will be out of med today and requests for meds to be called into pharmacy today.

## 2019-03-16 NOTE — Telephone Encounter (Signed)
Sent to pharmacy 

## 2019-03-20 ENCOUNTER — Encounter: Payer: Self-pay | Admitting: Family Medicine

## 2019-03-23 ENCOUNTER — Ambulatory Visit: Payer: Medicare Other | Admitting: Podiatry

## 2019-03-24 ENCOUNTER — Other Ambulatory Visit: Payer: Self-pay

## 2019-03-24 ENCOUNTER — Encounter: Payer: Self-pay | Admitting: Family Medicine

## 2019-03-24 ENCOUNTER — Ambulatory Visit (INDEPENDENT_AMBULATORY_CARE_PROVIDER_SITE_OTHER): Payer: Medicare Other | Admitting: Family Medicine

## 2019-03-24 ENCOUNTER — Telehealth: Payer: Self-pay | Admitting: Family Medicine

## 2019-03-24 DIAGNOSIS — M7062 Trochanteric bursitis, left hip: Secondary | ICD-10-CM

## 2019-03-24 DIAGNOSIS — Z5181 Encounter for therapeutic drug level monitoring: Secondary | ICD-10-CM

## 2019-03-24 DIAGNOSIS — F419 Anxiety disorder, unspecified: Secondary | ICD-10-CM

## 2019-03-24 DIAGNOSIS — F329 Major depressive disorder, single episode, unspecified: Secondary | ICD-10-CM | POA: Diagnosis not present

## 2019-03-24 NOTE — Assessment & Plan Note (Signed)
Had a long discussion with the patient regarding potential treatment options including physical therapy, anti-inflammatories, and steroid use.  Steroids are not an option for her given her reaction to prednisone.  I offered physical therapy and home health physical therapy referrals though she declined these at this time.  She will continue with her anti-inflammatory use.  She will do home exercises.  We will have her come in to the office for renal function given how long she has been using the Advil for.

## 2019-03-24 NOTE — Telephone Encounter (Signed)
Called and scheduled patient a appointment for labs.  Nina,cma

## 2019-03-24 NOTE — Telephone Encounter (Signed)
Can you call the patient and get her scheduled for lab work sometime next week?  Thanks.

## 2019-03-24 NOTE — Assessment & Plan Note (Signed)
This continues to be an issue.  I discussed seeing another therapist for starting on antidepressant though she could climb things at this time.  I do think things will improve once her hip is improving.  She will monitor on her current regimen at this time.

## 2019-03-24 NOTE — Patient Instructions (Signed)
 Hip Bursitis Rehab Ask your health care provider which exercises are safe for you. Do exercises exactly as told by your health care provider and adjust them as directed. It is normal to feel mild stretching, pulling, tightness, or discomfort as you do these exercises. Stop right away if you feel sudden pain or your pain gets worse. Do not begin these exercises until told by your health care provider. Stretching exercise This exercise warms up your muscles and joints and improves the movement and flexibility of your hip. This exercise also helps to relieve pain and stiffness. Iliotibial band stretch An iliotibial band is a strong band of muscle tissue that runs from the outer side of your hip to the outer side of your thigh and knee. 1. Lie on your side with your left / right leg in the top position. 2. Bend your left / right knee and grab your ankle. Stretch out your bottom arm to help you balance. 3. Slowly bring your knee back so your thigh is behind your body. 4. Slowly lower your knee toward the floor until you feel a gentle stretch on the outside of your left / right thigh. If you do not feel a stretch and your knee will not fall farther, place the heel of your other foot on top of your knee and pull your knee down toward the floor with your foot. 5. Hold this position for __________ seconds. 6. Slowly return to the starting position. Repeat __________ times. Complete this exercise __________ times a day. Strengthening exercises These exercises build strength and endurance in your hip and pelvis. Endurance is the ability to use your muscles for a long time, even after they get tired. Bridge This exercise strengthens the muscles that move your thigh backward (hip extensors). 1. Lie on your back on a firm surface with your knees bent and your feet flat on the floor. 2. Tighten your buttocks muscles and lift your buttocks off the floor until your trunk is level with your thighs. ? Do not  arch your back. ? You should feel the muscles working in your buttocks and the back of your thighs. If you do not feel these muscles, slide your feet 1-2 inches (2.5-5 cm) farther away from your buttocks. ? If this exercise is too easy, try doing it with your arms crossed over your chest. 3. Hold this position for __________ seconds. 4. Slowly lower your hips to the starting position. 5. Let your muscles relax completely after each repetition. Repeat __________ times. Complete this exercise __________ times a day. Squats This exercise strengthens the muscles in front of your thigh and knee (quadriceps). 1. Stand in front of a table, with your feet and knees pointing straight ahead. You may rest your hands on the table for balance but not for support. 2. Slowly bend your knees and lower your hips like you are going to sit in a chair. ? Keep your weight over your heels, not over your toes. ? Keep your lower legs upright so they are parallel with the table legs. ? Do not let your hips go lower than your knees. ? Do not bend lower than told by your health care provider. ? If your hip pain increases, do not bend as low. 3. Hold the squat position for __________ seconds. 4. Slowly push with your legs to return to standing. Do not use your hands to pull yourself to standing. Repeat __________ times. Complete this exercise __________ times a day. Hip hike 1.   Stand sideways on a bottom step. Stand on your left / right leg with your other foot unsupported next to the step. You can hold on to the railing or wall for balance if needed. 2. Keep your knees straight and your torso square. Then lift your left / right hip up toward the ceiling. 3. Hold this position for __________ seconds. 4. Slowly let your left / right hip lower toward the floor, past the starting position. Your foot should get closer to the floor. Do not lean or bend your knees. Repeat __________ times. Complete this exercise __________  times a day. Single leg stand 1. Without shoes, stand near a railing or in a doorway. You may hold on to the railing or door frame as needed for balance. 2. Squeeze your left / right buttock muscles, then lift up your other foot. ? Do not let your left / right hip push out to the side. ? It is helpful to stand in front of a mirror for this exercise so you can watch your hip. 3. Hold this position for __________ seconds. Repeat __________ times. Complete this exercise __________ times a day. This information is not intended to replace advice given to you by your health care provider. Make sure you discuss any questions you have with your health care provider. Document Released: 10/08/2004 Document Revised: 12/26/2018 Document Reviewed: 12/26/2018 Elsevier Patient Education  2020 [employer]  

## 2019-03-24 NOTE — Progress Notes (Signed)
Virtual Visit via telephone Note  This visit type was conducted due to national recommendations for restrictions regarding the COVID-19 pandemic (e.g. social distancing).  This format is felt to be most appropriate for this patient at this time.  All issues noted in this document were discussed and addressed.  No physical exam was performed (except for noted visual exam findings with Video Visits).   I connected with Toni Berger today at 11:30 AM EDT by telephone and verified that I am speaking with the correct person using two identifiers. Location patient: home Location provider: work  Persons participating in the virtual visit: patient, provider  I discussed the limitations, risks, security and privacy concerns of performing an evaluation and management service by telephone and the availability of in person appointments. I also discussed with the patient that there may be a patient responsible charge related to this service. The patient expressed understanding and agreed to proceed.  Interactive audio and video telecommunications were attempted between this provider and patient, however failed, due to patient having technical difficulties OR patient did not have access to video capability.  We continued and completed visit with audio only.  Reason for visit: follow-up  HPI: Left hip pain: seen in the ED for this and diagnosed with trochanteric bursitis.  She did take prednisone though she appears to have had an anaphylactic reaction to this with throat swelling and she discontinued that.  This has been added to her allergy list.  She notes her hip still bothers her mostly at night when she is bending over.  She does use a walker occasionally to walk.  She has been taking 2 over-the-counter Advil every 6 hours since about 03/06/2019.  She notes no fall or injury.  Anxiety/depression: She does note some anxiety and depression.  Most of this is related to the COVID-19 pandemic and not being  able to do much of anything and being on her own and isolated.  The hip issue has added to this.  She notes it is not grief anymore.  She takes clonazepam 2-3 times per day and this is helpful.  She has seen numerous therapists previously with no benefit.  She does not want to take an antidepressant.  She notes no SI.  ROS: See pertinent positives and negatives per HPI.  Past Medical History:  Diagnosis Date  . Allergy   . Anxiety   . Arthritis   . Fibrocystic breast   . Hyperlipidemia   . Hypertension   . Lipoma    5/01 Abdominal wall lipoma  . Loss of consciousness ([place]) 12/15/2015  . Migraines   . Osteoporosis, unspecified   . Restless leg syndrome   . Stroke (cerebrum) [facility])     Past Surgical History:  Procedure Laterality Date  . APPENDECTOMY  1999  . BREAST CYST ASPIRATION    . CHOLECYSTECTOMY  1999  . DOBUTAMINE STRESS ECHO  06/96   negative  . TONSILLECTOMY AND ADENOIDECTOMY  1974  . TOTAL ABDOMINAL HYSTERECTOMY W/ BILATERAL SALPINGOOPHORECTOMY  1981    Family History  Problem Relation Age of Onset  . Heart disease Mother   . Diabetes Mother   . Aneurysm Mother        AAA  . Heart failure Mother   . Heart failure Father   . Coronary artery disease Father   . Colon cancer Neg Hx   . Breast cancer Neg Hx     SOCIAL HX: Former smoker   Current Outpatient Medications:  .  amLODipine (NORVASC) 5 MG tablet, TAKE 1 TABLET DAILY, Disp: 90 tablet, Rfl: 3 .  Cholecalciferol (VITAMIN D3) 2000 units capsule, Take 1 capsule (2,000 Units total) by mouth daily., Disp: 90 capsule, Rfl: 1 .  clonazePAM (KLONOPIN) 1 MG tablet, TAKE 1 TABLET BY MOUTH EVERY 8 TO 12 HOURS AS NEEDED FOR ANXIETY (MAX 3 DOSES IN 24 HOURS), Disp: 90 tablet, Rfl: 0 .  clopidogrel (PLAVIX) 75 MG tablet, TAKE 1 TABLET DAILY, Disp: 90 tablet, Rfl: 3 .  Cyanocobalamin (B-12) 1000 MCG TABS, Take 1 tablet by mouth daily., Disp: 90 tablet, Rfl: 0 .  ibuprofen (ADVIL) 200 MG tablet, Take 200 mg by mouth  every 6 (six) hours as needed.  , Disp: , Rfl:  .  lisinopril (PRINIVIL,ZESTRIL) 40 MG tablet, TAKE 1 TABLET DAILY, Disp: 90 tablet, Rfl: 3 .  traMADol (ULTRAM) 50 MG tablet, Take 1 tablet (50 mg total) by mouth every 6 (six) hours as needed., Disp: 10 tablet, Rfl: 0  EXAM: This was a telehealth telephone visit and thus no physical exam was completed.    ASSESSMENT AND PLAN:  Discussed the following assessment and plan:  Trochanteric bursitis of left hip Had a long discussion with the patient regarding potential treatment options including physical therapy, anti-inflammatories, and steroid use.  Steroids are not an option for her given her reaction to prednisone.  I offered physical therapy and home health physical therapy referrals though she declined these at this time.  She will continue with her anti-inflammatory use.  She will do home exercises.  We will have her come in to the office for renal function given how long she has been using the Advil for.    Anxiety and depression This continues to be an issue.  I discussed seeing another therapist for starting on antidepressant though she could climb things at this time.  I do think things will improve once her hip is improving.  She will monitor on her current regimen at this time.    I discussed the assessment and treatment plan with the patient. The patient was provided an opportunity to ask questions and all were answered. The patient agreed with the plan and demonstrated an understanding of the instructions.   The patient was advised to call back or seek an in-person evaluation if the symptoms worsen or if the condition fails to improve as anticipated.  I provided 30 minutes of non-face-to-face time during this encounter.   [name], MD

## 2019-03-24 NOTE — Telephone Encounter (Signed)
Can call the patient and get her scheduled for labs this coming week? Orders placed.

## 2019-03-27 ENCOUNTER — Inpatient Hospital Stay
Admission: EM | Admit: 2019-03-27 | Discharge: 2019-04-02 | DRG: 378 | Disposition: A | Discharge status: Home / Self Care | Payer: Medicare Other | Attending: Internal Medicine | Admitting: Internal Medicine

## 2019-03-27 ENCOUNTER — Emergency Department: Payer: Medicare Other

## 2019-03-27 ENCOUNTER — Other Ambulatory Visit: Payer: Self-pay

## 2019-03-27 DIAGNOSIS — E785 Hyperlipidemia, unspecified: Secondary | ICD-10-CM | POA: Diagnosis present

## 2019-03-27 DIAGNOSIS — K2210 Ulcer of esophagus without bleeding: Secondary | ICD-10-CM | POA: Diagnosis present

## 2019-03-27 DIAGNOSIS — K5521 Angiodysplasia of colon with hemorrhage: Secondary | ICD-10-CM | POA: Diagnosis not present

## 2019-03-27 DIAGNOSIS — K3189 Other diseases of stomach and duodenum: Secondary | ICD-10-CM | POA: Diagnosis not present

## 2019-03-27 DIAGNOSIS — M7062 Trochanteric bursitis, left hip: Secondary | ICD-10-CM | POA: Diagnosis present

## 2019-03-27 DIAGNOSIS — M1710 Unilateral primary osteoarthritis, unspecified knee: Secondary | ICD-10-CM | POA: Diagnosis present

## 2019-03-27 DIAGNOSIS — Z79899 Other long term (current) drug therapy: Secondary | ICD-10-CM

## 2019-03-27 DIAGNOSIS — G8929 Other chronic pain: Secondary | ICD-10-CM | POA: Diagnosis present

## 2019-03-27 DIAGNOSIS — Z7902 Long term (current) use of antithrombotics/antiplatelets: Secondary | ICD-10-CM | POA: Diagnosis not present

## 2019-03-27 DIAGNOSIS — R5383 Other fatigue: Secondary | ICD-10-CM | POA: Diagnosis not present

## 2019-03-27 DIAGNOSIS — E876 Hypokalemia: Secondary | ICD-10-CM | POA: Diagnosis not present

## 2019-03-27 DIAGNOSIS — K449 Diaphragmatic hernia without obstruction or gangrene: Secondary | ICD-10-CM | POA: Diagnosis present

## 2019-03-27 DIAGNOSIS — M62838 Other muscle spasm: Secondary | ICD-10-CM | POA: Diagnosis present

## 2019-03-27 DIAGNOSIS — M79651 Pain in right thigh: Secondary | ICD-10-CM | POA: Diagnosis present

## 2019-03-27 DIAGNOSIS — K279 Peptic ulcer, site unspecified, unspecified as acute or chronic, without hemorrhage or perforation: Secondary | ICD-10-CM | POA: Diagnosis present

## 2019-03-27 DIAGNOSIS — K31819 Angiodysplasia of stomach and duodenum without bleeding: Secondary | ICD-10-CM | POA: Diagnosis not present

## 2019-03-27 DIAGNOSIS — T189XXA Foreign body of alimentary tract, part unspecified, initial encounter: Secondary | ICD-10-CM

## 2019-03-27 DIAGNOSIS — G2581 Restless legs syndrome: Secondary | ICD-10-CM | POA: Diagnosis present

## 2019-03-27 DIAGNOSIS — M79652 Pain in left thigh: Secondary | ICD-10-CM | POA: Diagnosis present

## 2019-03-27 DIAGNOSIS — Z882 Allergy status to sulfonamides status: Secondary | ICD-10-CM

## 2019-03-27 DIAGNOSIS — T39395A Adverse effect of other nonsteroidal anti-inflammatory drugs [NSAID], initial encounter: Secondary | ICD-10-CM | POA: Diagnosis present

## 2019-03-27 DIAGNOSIS — R079 Chest pain, unspecified: Secondary | ICD-10-CM | POA: Diagnosis not present

## 2019-03-27 DIAGNOSIS — Z03818 Encounter for observation for suspected exposure to other biological agents ruled out: Secondary | ICD-10-CM

## 2019-03-27 DIAGNOSIS — Z888 Allergy status to other drugs, medicaments and biological substances status: Secondary | ICD-10-CM

## 2019-03-27 DIAGNOSIS — Z8673 Personal history of transient ischemic attack (TIA), and cerebral infarction without residual deficits: Secondary | ICD-10-CM

## 2019-03-27 DIAGNOSIS — D649 Anemia, unspecified: Secondary | ICD-10-CM | POA: Diagnosis not present

## 2019-03-27 DIAGNOSIS — K922 Gastrointestinal hemorrhage, unspecified: Secondary | ICD-10-CM

## 2019-03-27 DIAGNOSIS — R0789 Other chest pain: Secondary | ICD-10-CM | POA: Diagnosis present

## 2019-03-27 DIAGNOSIS — I10 Essential (primary) hypertension: Secondary | ICD-10-CM | POA: Diagnosis not present

## 2019-03-27 DIAGNOSIS — D62 Acute posthemorrhagic anemia: Secondary | ICD-10-CM | POA: Diagnosis present

## 2019-03-27 DIAGNOSIS — F419 Anxiety disorder, unspecified: Secondary | ICD-10-CM | POA: Diagnosis not present

## 2019-03-27 DIAGNOSIS — F418 Other specified anxiety disorders: Secondary | ICD-10-CM | POA: Diagnosis not present

## 2019-03-27 DIAGNOSIS — M810 Age-related osteoporosis without current pathological fracture: Secondary | ICD-10-CM | POA: Diagnosis present

## 2019-03-27 DIAGNOSIS — Z66 Do not resuscitate: Secondary | ICD-10-CM | POA: Diagnosis present

## 2019-03-27 DIAGNOSIS — K208 Other esophagitis: Secondary | ICD-10-CM | POA: Diagnosis not present

## 2019-03-27 DIAGNOSIS — T300 Burn of unspecified body region, unspecified degree: Secondary | ICD-10-CM | POA: Diagnosis not present

## 2019-03-27 DIAGNOSIS — Z87891 Personal history of nicotine dependence: Secondary | ICD-10-CM

## 2019-03-27 DIAGNOSIS — Z8249 Family history of ischemic heart disease and other diseases of the circulatory system: Secondary | ICD-10-CM

## 2019-03-27 DIAGNOSIS — K921 Melena: Secondary | ICD-10-CM | POA: Diagnosis not present

## 2019-03-27 DIAGNOSIS — Z20828 Contact with and (suspected) exposure to other viral communicable diseases: Secondary | ICD-10-CM | POA: Diagnosis not present

## 2019-03-27 DIAGNOSIS — R531 Weakness: Secondary | ICD-10-CM | POA: Diagnosis not present

## 2019-03-27 DIAGNOSIS — K31811 Angiodysplasia of stomach and duodenum with bleeding: Principal | ICD-10-CM | POA: Diagnosis present

## 2019-03-27 DIAGNOSIS — Z791 Long term (current) use of non-steroidal anti-inflammatories (NSAID): Secondary | ICD-10-CM | POA: Diagnosis not present

## 2019-03-27 DIAGNOSIS — D509 Iron deficiency anemia, unspecified: Secondary | ICD-10-CM | POA: Diagnosis not present

## 2019-03-27 DIAGNOSIS — K209 Esophagitis, unspecified: Secondary | ICD-10-CM | POA: Diagnosis not present

## 2019-03-27 DIAGNOSIS — Q2730 Arteriovenous malformation, site unspecified: Secondary | ICD-10-CM | POA: Diagnosis not present

## 2019-03-27 DIAGNOSIS — R61 Generalized hyperhidrosis: Secondary | ICD-10-CM | POA: Diagnosis not present

## 2019-03-27 DIAGNOSIS — R198 Other specified symptoms and signs involving the digestive system and abdomen: Secondary | ICD-10-CM | POA: Diagnosis not present

## 2019-03-27 DIAGNOSIS — R42 Dizziness and giddiness: Secondary | ICD-10-CM | POA: Diagnosis not present

## 2019-03-27 HISTORY — DX: Gastrointestinal hemorrhage, unspecified: K92.2

## 2019-03-27 LAB — CBC WITH DIFFERENTIAL/PLATELET
Abs Immature Granulocytes: 0.06 10*3/uL (ref 0.00–0.07)
Basophils Absolute: 0.1 10*3/uL (ref 0.0–0.1)
Basophils Relative: 0 %
Eosinophils Absolute: 0 10*3/uL (ref 0.0–0.5)
Eosinophils Relative: 0 %
HCT: 30.7 % — ABNORMAL LOW (ref 36.0–46.0)
Hemoglobin: 9.8 g/dL — ABNORMAL LOW (ref 12.0–15.0)
Immature Granulocytes: 0 %
Lymphocytes Relative: 12 %
Lymphs Abs: 1.9 10*3/uL (ref 0.7–4.0)
MCH: 28.6 pg (ref 26.0–34.0)
MCHC: 31.9 g/dL (ref 30.0–36.0)
MCV: 89.5 fL (ref 80.0–100.0)
Monocytes Absolute: 0.9 10*3/uL (ref 0.1–1.0)
Monocytes Relative: 6 %
Neutro Abs: 12.6 10*3/uL — ABNORMAL HIGH (ref 1.7–7.7)
Neutrophils Relative %: 82 %
Platelets: 323 10*3/uL (ref 150–400)
RBC: 3.43 MIL/uL — ABNORMAL LOW (ref 3.87–5.11)
RDW: 13.8 % (ref 11.5–15.5)
WBC: 15.6 10*3/uL — ABNORMAL HIGH (ref 4.0–10.5)
nRBC: 0 % (ref 0.0–0.2)

## 2019-03-27 LAB — COMPREHENSIVE METABOLIC PANEL
ALT: 17 U/L (ref 0–44)
AST: 16 U/L (ref 15–41)
Albumin: 3.6 g/dL (ref 3.5–5.0)
Alkaline Phosphatase: 73 U/L (ref 38–126)
Anion gap: 7 (ref 5–15)
BUN: 53 mg/dL — ABNORMAL HIGH (ref 8–23)
CO2: 25 mmol/L (ref 22–32)
Calcium: 9.5 mg/dL (ref 8.9–10.3)
Chloride: 107 mmol/L (ref 98–111)
Creatinine, Ser: 0.64 mg/dL (ref 0.44–1.00)
GFR calc Af Amer: >60 mL/min (ref >60)
GFR calc non Af Amer: >60 mL/min (ref >60)
Glucose, Bld: 87 mg/dL (ref 70–99)
Potassium: 3.7 mmol/L (ref 3.5–5.1)
Sodium: 139 mmol/L (ref 135–145)
Total Bilirubin: 0.4 mg/dL (ref 0.3–1.2)
Total Protein: 6.3 g/dL — ABNORMAL LOW (ref 6.5–8.1)

## 2019-03-27 LAB — LIPASE, BLOOD: Lipase: 42 U/L (ref 11–51)

## 2019-03-27 LAB — PROTIME-INR
INR: 1 (ref 0.8–1.2)
Prothrombin Time: 13 seconds (ref 11.4–15.2)

## 2019-03-27 LAB — HEMOGLOBIN: Hemoglobin: 9 g/dL — ABNORMAL LOW (ref 12.0–15.0)

## 2019-03-27 LAB — SARS CORONAVIRUS 2 BY RT PCR (HOSPITAL ORDER, PERFORMED IN ~~LOC~~ HOSPITAL LAB): SARS Coronavirus 2: NEGATIVE

## 2019-03-27 MED ORDER — ONDANSETRON HCL 4 MG PO TABS: 4.0000 mg | ORAL_TABLET | Freq: Four times a day (QID) | ORAL | Status: DC | PRN

## 2019-03-27 MED ORDER — SODIUM CHLORIDE 0.9 % IV SOLN
80.0000 mg | Freq: Once | INTRAVENOUS | Status: AC
  Administered 2019-03-27: 80 mg via INTRAVENOUS
  Filled 2019-03-27: qty 80

## 2019-03-27 MED ORDER — ONDANSETRON HCL 4 MG/2ML IJ SOLN
4.0000 mg | Freq: Once | INTRAMUSCULAR | Status: DC
  Filled 2019-03-27: qty 2

## 2019-03-27 MED ORDER — VITAMIN B-12 1000 MCG PO TABS
1000.0000 ug | ORAL_TABLET | Freq: Every day | ORAL | Status: DC
  Administered 2019-03-28 – 2019-04-02 (×5): 1000 ug via ORAL
  Filled 2019-03-27 (×5): qty 1

## 2019-03-27 MED ORDER — SODIUM CHLORIDE 0.9 % IV BOLUS: 1000.0000 mL | Freq: Once | INTRAVENOUS | Status: DC

## 2019-03-27 MED ORDER — SODIUM CHLORIDE 0.9 % IV SOLN
INTRAVENOUS | Status: DC
  Administered 2019-03-28 (×2): via INTRAVENOUS

## 2019-03-27 MED ORDER — ACETAMINOPHEN 325 MG PO TABS: 650.0000 mg | ORAL_TABLET | Freq: Four times a day (QID) | ORAL | Status: DC | PRN

## 2019-03-27 MED ORDER — ACETAMINOPHEN 650 MG RE SUPP: 650.0000 mg | Freq: Four times a day (QID) | RECTAL | Status: DC | PRN

## 2019-03-27 MED ORDER — MORPHINE SULFATE (PF) 4 MG/ML IV SOLN
4.0000 mg | Freq: Once | INTRAVENOUS | Status: AC
  Administered 2019-03-27: 17:00:00 2 mg via INTRAVENOUS
  Filled 2019-03-27: qty 1

## 2019-03-27 MED ORDER — SODIUM CHLORIDE 0.9 % IV SOLN
8.0000 mg/h | INTRAVENOUS | Status: AC
  Administered 2019-03-27 – 2019-03-30 (×6): 8 mg/h via INTRAVENOUS
  Filled 2019-03-27 (×6): qty 80

## 2019-03-27 MED ORDER — CLONAZEPAM 1 MG PO TABS
1.0000 mg | ORAL_TABLET | Freq: Two times a day (BID) | ORAL | Status: DC | PRN
  Administered 2019-03-29 – 2019-04-01 (×4): 1 mg via ORAL
  Filled 2019-03-27 (×4): qty 1

## 2019-03-27 MED ORDER — VITAMIN D 25 MCG (1000 UNIT) PO TABS
2000.0000 [IU] | ORAL_TABLET | Freq: Every day | ORAL | Status: DC
  Administered 2019-03-28 – 2019-04-02 (×5): 2000 [IU] via ORAL
  Filled 2019-03-27 (×4): qty 2

## 2019-03-27 MED ORDER — PANTOPRAZOLE SODIUM 40 MG IV SOLR
40.0000 mg | Freq: Two times a day (BID) | INTRAVENOUS | Status: DC
  Administered 2019-03-31 – 2019-04-01 (×4): 40 mg via INTRAVENOUS
  Filled 2019-03-27 (×4): qty 40

## 2019-03-27 MED ORDER — SODIUM CHLORIDE 0.9 % IV SOLN
Freq: Once | INTRAVENOUS | Status: AC
  Administered 2019-03-27: 17:00:00 via INTRAVENOUS

## 2019-03-27 MED ORDER — TRAMADOL HCL 50 MG PO TABS
50.0000 mg | ORAL_TABLET | Freq: Four times a day (QID) | ORAL | Status: DC | PRN
  Administered 2019-03-28 – 2019-04-02 (×12): 50 mg via ORAL
  Filled 2019-03-27 (×12): qty 1

## 2019-03-27 MED ORDER — ONDANSETRON HCL 4 MG/2ML IJ SOLN
4.0000 mg | Freq: Four times a day (QID) | INTRAMUSCULAR | Status: DC | PRN
  Administered 2019-03-28 – 2019-03-31 (×2): 4 mg via INTRAVENOUS
  Filled 2019-03-27 (×2): qty 2

## 2019-03-27 NOTE — Progress Notes (Signed)
Advanced care plan.  Purpose of the Encounter: CODE STATUS  Parties in Attendance: Patient herself  Patient's Decision Capacity: Intact  Subjective/Patient's story: Toni Berger  is a 73 y.o. female with a known history of hypertension, hyperlipidemia, restless leg syndrome, osteoarthritis of the knee who states that she has been having dark-colored stools since yesterday.  She has had multiple episodes of this.  Did not have any abdominal pain.  Patient does take 2 Advil per day.  She states that she did feel dizzy and weak today.  Denies any hematemesis denies any chest pain or shortness of breath.   Objective/Medical story I discussed with the patient regarding her desires for cardiac and pulmonary resuscitation as well as if she had a living will and healthcare power of attorney   Goals of care determination:  Patient states that she wants to be a DNR.  She also has a living will and healthcare power of attorney   CODE STATUS: DNR   Time spent discussing advanced care planning: 16 minutes

## 2019-03-27 NOTE — ED Provider Notes (Addendum)
[facility] Emergency Department Provider Note  ____________________________________________   First MD Initiated Contact with Patient 03/27/19 1450     (approximate)  I have reviewed the triage vital signs and the nursing notes.   HISTORY  Chief Complaint Melena and Weakness    HPI Toni Berger is a 73 y.o. female here with melena.  The patient states that she has been having slightly increased reflux over the last 24 hours.  Earlier today, overnight, she states she woke up and was coughing like she had choked.  She felt like she needed to vomit but could not.  She then broke out in a very cold sweat, felt like she needed to go the restroom.  She then had a large, black, tarry bowel movement.  Since then, she had persistent epigastric pain as well as multiple loose, tarry black bowel movements.  She denies any bright red blood per rectum.  Of note, she has history of chronic hip pain and is been taking ibuprofen regularly for this.  Denies known history of ulcers.  No recent fevers or chills.  No significant abdominal pain.  No other acute complaints.        Past Medical History:  Diagnosis Date   Allergy    Anxiety    Arthritis    Fibrocystic breast    Hyperlipidemia    Hypertension    Lipoma    5/01 Abdominal wall lipoma   Loss of consciousness ([place]) 12/15/2015   Migraines    Osteoporosis, unspecified    Restless leg syndrome    Stroke (cerebrum) [facility])     Patient Active Problem List   Diagnosis Date Noted   Trochanteric bursitis of left hip 03/24/2019   Right low back pain 10/24/2018   Exertional dyspnea 08/11/2018   Rib pain on left side 08/11/2018   Candidal intertrigo 08/11/2018   Sweating abnormality 08/11/2018   Adrenal adenoma, left 04/23/2018   Hiatal hernia 04/23/2018   Aortic atherosclerosis ([place]) 04/23/2018   Cramps of lower extremity 07/15/2017   Fatigue 07/15/2017   Decreased pedal pulses 07/30/2016    Anxiety and depression 04/29/2016   Ankle edema 03/23/2016   Hyperlipidemia 01/09/2016   Closed rib fracture 01/09/2016   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   Rotator cuff impingement syndrome 12/15/2015   Cerebrovascular accident, old 10/29/2015   HYPERTENSION, BENIGN 11/12/2010   OSTEOARTHRITIS 08/13/2010   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 Authorizing Provider  amLODipine (NORVASC) 5 MG tablet TAKE 1 TABLET DAILY Patient taking differently: Take 5 mg by mouth daily.  09/15/18  Yes Leone Haven, MD  clonazePAM (KLONOPIN) 1 MG tablet TAKE 1 TABLET BY MOUTH EVERY 8 TO 12 HOURS AS NEEDED FOR ANXIETY (MAX 3 DOSES IN 24 HOURS) Patient taking differently: Take 1 mg by mouth 2 (two) times daily as needed for anxiety.  03/16/19  Yes Leone Haven, MD  clopidogrel (PLAVIX) 75 MG tablet TAKE 1 TABLET DAILY Patient taking differently: Take 75 mg by mouth daily.  09/15/18  Yes Leone Haven, MD  ibuprofen (ADVIL) 200 MG tablet Take 200 mg by mouth every 6 (six) hours as needed for fever or mild pain.    Yes [provider]  lisinopril (PRINIVIL,ZESTRIL) 40 MG tablet TAKE 1 TABLET DAILY Patient taking differently: Take 40 mg by mouth daily.  06/29/18  Yes Leone Haven, MD  Cholecalciferol (VITAMIN D3) 2000 units capsule Take 1 capsule (2,000 Units total) by mouth daily. 07/12/18   Guse, Jacquelynn Cree, FNP  Cyanocobalamin (B-12) 1000 MCG TABS Take 1 tablet by mouth daily. 07/12/18   Jodelle Green, FNP    traMADol (ULTRAM) 50 MG tablet Take 1 tablet (50 mg total) by mouth every 6 (six) hours as needed. 03/13/19   Cuthriell, Charline Bills, PA-C    Allergies Prednisone, Acetaminophen, Carbidopa-levodopa, Ciprofloxacin, Metoprolol, Povidone-iodine, Pravastatin, Propoxyphene hcl, Sulfonamide derivatives, and Tetracycline  Family History  Problem Relation Age of Onset   Heart disease Mother    Diabetes Mother    Aneurysm Mother        AAA   Heart failure Mother    Heart failure Father    Coronary artery disease Father    Colon cancer Neg Hx    Breast cancer Neg Hx     Social History Social History   Tobacco Use   Smoking status: Former Smoker   Smokeless tobacco: Never Used  Substance Use Topics   Alcohol use: No   Drug use: No    Review of Systems  Review of Systems  Constitutional: Positive for fatigue. Negative for fever.  HENT: Negative for congestion and sore throat.   Eyes: Negative for visual disturbance.  Respiratory: Negative for cough and shortness of breath.   Cardiovascular: Negative for chest pain.  Gastrointestinal: Positive for abdominal pain, blood in stool and nausea. Negative for diarrhea and vomiting.  Genitourinary: Negative for flank pain.  Musculoskeletal: Negative for back pain and neck pain.  Skin: Negative for rash and wound.  Neurological: Positive for weakness.     ____________________________________________  PHYSICAL EXAM:      VITAL SIGNS: ED Triage Vitals  Enc Vitals Group     BP 03/27/19 1428 (!) 164/90     Pulse Rate 03/27/19 1428 82     Resp 03/27/19 1428 20     Temp 03/27/19 1428 98.1 F (36.7 C)     Temp Source 03/27/19 1428 Oral     SpO2 03/27/19 1427 98 %     Weight 03/27/19 1429 170 lb (77.1 kg)     Height 03/27/19 1429 5\' 7"  (1.702 m)     Head Circumference --      Peak Flow --      Pain Score 03/27/19 1429 4     Pain Loc --      Pain Edu? --      Excl. in [place]? --      Physical Exam Vitals signs and  nursing note reviewed.  Constitutional:      General: She is not in acute distress.    Appearance: She is well-developed.  HENT:     Head: Normocephalic and atraumatic.  Eyes:     Conjunctiva/sclera: Conjunctivae normal.  Neck:     Musculoskeletal: Neck supple.  Cardiovascular:     Rate and Rhythm: Normal rate and regular rhythm.     Heart sounds: Normal heart sounds. No murmur. No friction rub.  Pulmonary:     Effort: Pulmonary effort is normal. No respiratory distress.  Breath sounds: Normal breath sounds. No wheezing or rales.  Abdominal:     General: Abdomen is flat. There is no distension.     Palpations: Abdomen is soft.     Tenderness: There is abdominal tenderness (Mild, epigastric).  Genitourinary:    Comments: Gross melena noted, no bright red blood per rectum Skin:    General: Skin is warm.     Capillary Refill: Capillary refill takes less than 2 seconds.  Neurological:     Mental Status: She is alert and oriented to person, place, and time.     Motor: No abnormal muscle tone.       ____________________________________________   LABS (all labs ordered are listed, but only abnormal results are displayed)  Labs Reviewed  CBC WITH DIFFERENTIAL/PLATELET - Abnormal; Notable for the following components:      Result Value   WBC 15.6 (*)    RBC 3.43 (*)    Hemoglobin 9.8 (*)    HCT 30.7 (*)    Neutro Abs 12.6 (*)    All other components within normal limits  COMPREHENSIVE METABOLIC PANEL - Abnormal; Notable for the following components:   BUN 53 (*)    Total Protein 6.3 (*)    All other components within normal limits  PROTIME-INR  LIPASE, BLOOD  TYPE AND SCREEN  TYPE AND SCREEN    ____________________________________________  EKG: None ________________________________________  RADIOLOGY All imaging, including plain films, CT scans, and ultrasounds, independently reviewed by me, and interpretations confirmed via formal radiology reads.  ED MD  interpretation:   Abdominal series: No acute abnormality  Official radiology report(s): No results found.  ____________________________________________  PROCEDURES   Procedure(s) performed (including Critical Care):  .Critical Care Performed by: Duffy Bruce, MD Authorized by: Duffy Bruce, MD   Critical care provider statement:    Critical care time (minutes):  35   Critical care time was exclusive of:  Separately billable procedures and treating other patients and teaching time   Critical care was necessary to treat or prevent imminent or life-threatening deterioration of the following conditions:  Circulatory failure and cardiac failure   Critical care was time spent personally by me on the following activities:  Development of treatment plan with patient or surrogate, discussions with consultants, evaluation of patient's response to treatment, examination of patient, obtaining history from patient or surrogate, ordering and performing treatments and interventions, ordering and review of laboratory studies, ordering and review of radiographic studies, pulse oximetry, re-evaluation of patient's condition and review of old charts   I assumed direction of critical care for this patient from another provider in my specialty: no      ____________________________________________  INITIAL IMPRESSION / MDM / [place] / ED COURSE  As part of my medical decision making, I reviewed the following data within the electronic MEDICAL RECORD NUMBER Notes from prior ED visits and [place] Controlled Substance Database      *Toni Berger was evaluated in Emergency Department on 03/27/2019 for the symptoms described in the history of present illness. She was evaluated in the context of the global COVID-19 pandemic, which necessitated consideration that the patient might be at risk for infection with the SARS-CoV-2 virus that causes COVID-19. Institutional protocols and algorithms that  pertain to the evaluation of patients at risk for COVID-19 are in a state of rapid change based on information released by regulatory bodies including the CDC and federal and state organizations. These policies and algorithms were followed during the patient's care  in the ED.  Some ED evaluations and interventions may be delayed as a result of limited staffing during the pandemic.*      Medical Decision Making: 73 year old female here with acute upper GI bleed.  Hemoglobin down 4 points from baseline.  She is hemodynamically stable, however, with no hematochezia or evidence of brisk bleed.  Patient started on IV PPI bolus and drip.  Will hold Plavix, admit to medicine.  Discussed case with GI Dr. Marius Ditch, who is aware, plan to likely scope once Plavix washes out.  ____________________________________________  FINAL CLINICAL IMPRESSION(S) / ED DIAGNOSES  Final diagnoses:  Upper GI bleed     MEDICATIONS GIVEN DURING THIS VISIT:  Medications  pantoprazole (PROTONIX) 80 mg in sodium chloride 0.9 % 100 mL IVPB (has no administration in time range)  pantoprazole (PROTONIX) 80 mg in sodium chloride 0.9 % 250 mL (0.32 mg/mL) infusion (has no administration in time range)  pantoprazole (PROTONIX) injection 40 mg (has no administration in time range)  0.9 %  sodium chloride infusion (has no administration in time range)  morphine 4 MG/ML injection 4 mg (has no administration in time range)  ondansetron (ZOFRAN) injection 4 mg (has no administration in time range)     ED Discharge Orders    None       Note:  This document was prepared using Dragon voice recognition software and may include unintentional dictation errors.   Duffy Bruce, MD 03/27/19 1636    Duffy Bruce, MD 04/07/19 [phone]

## 2019-03-27 NOTE — ED Triage Notes (Signed)
PT to ED via EMS from home. PT c/o waking up last night with cough, burning throat, diaphoresis, SOB and 6 episodes of dark tarry stools. PT states SOB at this time still. PT AO. Skin warm and dry at this time,

## 2019-03-27 NOTE — H&P (Signed)
Toni Berger at [place] NAME: Toni Berger    MR#:  [id number]  DATE OF BIRTH:  06/25/1946  DATE OF ADMISSION:  03/27/2019  PRIMARY CARE PHYSICIAN: Leone Haven, MD   REQUESTING/REFERRING PHYSICIAN: Duffy Bruce, MD  CHIEF COMPLAINT:   Chief Complaint  Patient presents with  . Melena  . Weakness    HISTORY OF PRESENT ILLNESS: Toni Berger  is a 73 y.o. female with a known history of hypertension, hyperlipidemia, restless leg syndrome, osteoarthritis of the knee who states that she has been having dark-colored stools since yesterday.  She has had multiple episodes of this.  Did not have any abdominal pain.  Patient does take 2 Advil per day.  She states that she did feel dizzy and weak today.  Denies any hematemesis denies any chest pain or shortness of breath.      PAST MEDICAL HISTORY:   Past Medical History:  Diagnosis Date  . Allergy   . Anxiety   . Arthritis   . Fibrocystic breast   . Hyperlipidemia   . Hypertension   . Lipoma    5/01 Abdominal wall lipoma  . Loss of consciousness ([place]) 12/15/2015  . Migraines   . Osteoporosis, unspecified   . Restless leg syndrome   . Stroke (cerebrum) ([place])     PAST SURGICAL HISTORY:  Past Surgical History:  Procedure Laterality Date  . APPENDECTOMY  1999  . BREAST CYST ASPIRATION    . CHOLECYSTECTOMY  1999  . DOBUTAMINE STRESS ECHO  06/96   negative  . TONSILLECTOMY AND ADENOIDECTOMY  1974  . TOTAL ABDOMINAL HYSTERECTOMY W/ BILATERAL SALPINGOOPHORECTOMY  1981    SOCIAL HISTORY:  Social History   Tobacco Use  . Smoking status: Former Research scientist (life sciences)  . Smokeless tobacco: Never Used  Substance Use Topics  . Alcohol use: No    FAMILY HISTORY:  Family History  Problem Relation Age of Onset  . Heart disease Mother   . Diabetes Mother   . Aneurysm Mother        AAA  . Heart failure Mother   . Heart failure Father   . Coronary artery disease Father   . Colon cancer Neg Hx    . Breast cancer Neg Hx     DRUG ALLERGIES:  Allergies  Allergen Reactions  . Prednisone     Throat swelling   . Acetaminophen Diarrhea    Stomach pain  . Carbidopa-Levodopa     REACTION: headache, stomach pain, chest pain and seeing purple colors  . Ciprofloxacin   . Metoprolol     To low heart rate/ BP  . Povidone-Iodine   . Pravastatin Other (See Comments)    Muscle cramps  . Propoxyphene Hcl   . Sulfonamide Derivatives     "stop breathing, eyes roll in back of head and fingers web"  . Tetracycline     REVIEW OF SYSTEMS:   CONSTITUTIONAL: No fever, fatigue or weakness.  EYES: No blurred or double vision.  EARS, NOSE, AND THROAT: No tinnitus or ear pain.  RESPIRATORY: No cough, shortness of breath, wheezing or hemoptysis.  CARDIOVASCULAR: No chest pain, orthopnea, edema.  GASTROINTESTINAL: No nausea, vomiting, diarrhea or abdominal pain.  Positive dark tarry stools GENITOURINARY: No dysuria, hematuria.  ENDOCRINE: No polyuria, nocturia,  HEMATOLOGY: No anemia, easy bruising or bleeding SKIN: No rash or lesion. MUSCULOSKELETAL: No joint pain or arthritis.   NEUROLOGIC: No tingling, numbness, weakness.  PSYCHIATRY: No anxiety or depression.  MEDICATIONS AT HOME:  Prior to Admission medications   Medication Sig Start Date End Date Taking? Authorizing Provider  amLODipine (NORVASC) 5 MG tablet TAKE 1 TABLET DAILY Patient taking differently: Take 5 mg by mouth daily.  09/15/18  Yes Leone Haven, MD  clonazePAM (KLONOPIN) 1 MG tablet TAKE 1 TABLET BY MOUTH EVERY 8 TO 12 HOURS AS NEEDED FOR ANXIETY (MAX 3 DOSES IN 24 HOURS) Patient taking differently: Take 1 mg by mouth 2 (two) times daily as needed for anxiety.  03/16/19  Yes Leone Haven, MD  clopidogrel (PLAVIX) 75 MG tablet TAKE 1 TABLET DAILY Patient taking differently: Take 75 mg by mouth daily.  09/15/18  Yes Leone Haven, MD  ibuprofen (ADVIL) 200 MG tablet Take 200 mg by mouth every 6 (six) hours as  needed for fever or mild pain.    Yes [provider]  lisinopril (PRINIVIL,ZESTRIL) 40 MG tablet TAKE 1 TABLET DAILY Patient taking differently: Take 40 mg by mouth daily.  06/29/18  Yes Leone Haven, MD  Cholecalciferol (VITAMIN D3) 2000 units capsule Take 1 capsule (2,000 Units total) by mouth daily. 07/12/18   Guse, Jacquelynn Cree, FNP  Cyanocobalamin (B-12) 1000 MCG TABS Take 1 tablet by mouth daily. 07/12/18   Jodelle Green, FNP  traMADol (ULTRAM) 50 MG tablet Take 1 tablet (50 mg total) by mouth every 6 (six) hours as needed. 03/13/19   Cuthriell, Charline Bills, PA-C      PHYSICAL EXAMINATION:   VITAL SIGNS: Blood pressure (!) 163/78, pulse 92, temperature 98.1 F (36.7 C), temperature source Oral, resp. rate (!) 22, height 5\' 7"  (1.702 m), weight 77.1 kg, SpO2 100 %.  GENERAL:  73 y.o.-year-old patient lying in the bed with no acute distress.  EYES: Pupils equal, round, reactive to light and accommodation. No scleral icterus. Extraocular muscles intact.  HEENT: Head atraumatic, normocephalic. Oropharynx and nasopharynx clear.  NECK:  Supple, no jugular venous distention. No thyroid enlargement, no tenderness.  LUNGS: Normal breath sounds bilaterally, no wheezing, rales,rhonchi or crepitation. No use of accessory muscles of respiration.  CARDIOVASCULAR: S1, S2 normal. No murmurs, rubs, or gallops.  ABDOMEN: Soft, nontender, nondistended. Bowel sounds present. No organomegaly or mass.  EXTREMITIES: No pedal edema, cyanosis, or clubbing.  NEUROLOGIC: Cranial nerves II through XII are intact. Muscle strength 5/5 in all extremities. Sensation intact. Gait not checked.  PSYCHIATRIC: The patient is alert and oriented x 3.  SKIN: No obvious rash, lesion, or ulcer.   LABORATORY PANEL:   CBC Recent Labs  Lab 03/27/19 1506  WBC 15.6*  HGB 9.8*  HCT 30.7*  PLT 323  MCV 89.5  MCH 28.6  MCHC 31.9  RDW 13.8  LYMPHSABS 1.9  MONOABS 0.9  EOSABS 0.0  BASOSABS 0.1    ------------------------------------------------------------------------------------------------------------------  Chemistries  Recent Labs  Lab 03/27/19 1506  NA 139  K 3.7  CL 107  CO2 25  GLUCOSE 87  BUN 53*  CREATININE 0.64  CALCIUM 9.5  AST 16  ALT 17  ALKPHOS 73  BILITOT 0.4   ------------------------------------------------------------------------------------------------------------------ estimated creatinine clearance is 67 mL/min (by C-G formula based on SCr of 0.64 mg/dL). ------------------------------------------------------------------------------------------------------------------ No results for input(s): TSH, T4TOTAL, T3FREE, THYROIDAB in the last 72 hours.  Invalid input(s): FREET3   Coagulation profile Recent Labs  Lab 03/27/19 1506  INR 1.0   ------------------------------------------------------------------------------------------------------------------- No results for input(s): DDIMER in the last 72 hours. -------------------------------------------------------------------------------------------------------------------  Cardiac Enzymes No results for input(s): CKMB, TROPONINI, MYOGLOBIN in the  last 168 hours.  Invalid input(s): CK ------------------------------------------------------------------------------------------------------------------ Invalid input(s): POCBNP  ---------------------------------------------------------------------------------------------------------------  Urinalysis    Component Value Date/Time   COLORURINE STRAW (A) 09/23/2015 1454   APPEARANCEUR CLEAR (A) 09/23/2015 1454   LABSPEC 1.002 (L) 09/23/2015 1454   PHURINE 6.0 09/23/2015 1454   GLUCOSEU NEGATIVE 09/23/2015 1454   HGBUR 1+ (A) 09/23/2015 1454   HGBUR negative 07/22/2010 1221   BILIRUBINUR NEGATIVE 09/23/2015 1454   KETONESUR TRACE (A) 09/23/2015 1454   PROTEINUR NEGATIVE 09/23/2015 1454   UROBILINOGEN 0.2 07/22/2010 1221   NITRITE NEGATIVE  09/23/2015 1454   LEUKOCYTESUR NEGATIVE 09/23/2015 1454     RADIOLOGY: Dg Abdomen Acute W/chest  Result Date: 03/27/2019 CLINICAL DATA:  Melena EXAM: DG ABDOMEN ACUTE W/ 1V CHEST COMPARISON:  None. FINDINGS: Cardiac shadows within normal limits. Lungs are mildly hyperinflated. Small hiatal hernia is noted. No focal infiltrate is seen. Scattered large and small bowel gas is noted. No obstructive changes are seen. No free air is noted. No acute bony abnormality is seen. IMPRESSION: No acute abnormality in the chest and abdomen. Electronically Signed   By: Inez Catalina M.D.   On: 03/27/2019 16:55    EKG: Orders placed or performed in visit on 08/10/18  . EKG 12-Lead    IMPRESSION AND PLAN: Patient is a 73 year old white female presenting with dark tarry stools  1.  Upper GI bleed due to NSAID use Continue IV Protonix Follow hemoglobin transfuse as needed Have sent message to Dr. Allen Norris of GI they will see in the morning for likely endoscopy Hold Plavix  2. Essential hypertension I will hold her antihypertensives for now  3.  Previous stroke hold Plavix in light of GI bleed  4.  Anxiety continue Klonopin  5.  SCDs for DVT prophylaxis  All the records are reviewed and case discussed with ED provider. Management plans discussed with the patient, family and they are in agreement.  CODE STATUS: Advance Directive Documentation     Most Recent Value  Type of Advance Directive  Healthcare Power of Attorney, Out of facility DNR (pink MOST or yellow form)  Pre-existing out of facility DNR order (yellow form or pink MOST form)  Yellow form placed in chart (order not valid for inpatient use)  "MOST" Form in Place?  -       TOTAL TIME TAKING CARE OF THIS PATIENT: 55 minutes.    Dustin Flock M.D on 03/27/2019 at 6:05 PM  Between 7am to 6pm - Pager - [phone]  After 6pm go to [email or web address] - password [email or web address]  [facility] Office  [phone]  CC: Primary care  physician; Leone Haven, MD

## 2019-03-27 NOTE — ED Notes (Signed)
Pt admitted to 204 via stretcher, report called to RN

## 2019-03-27 NOTE — ED Notes (Signed)
ED TO INPATIENT HANDOFF REPORT  ED Nurse Name and Phone #: Yezenia Fredrick [phone]  S Name/Age/Gender Toni Berger 73 y.o. female Room/Bed: ED16A/ED16A  Code Status   Code Status: Not on file  Home/SNF/Other Home Patient oriented to: self, place, time and situation Is this baseline? Yes   Triage Complete: Triage complete  Chief Complaint blood in stools ems  Triage Note PT to ED via EMS from home. PT c/o waking up last night with cough, burning throat, diaphoresis, SOB and 6 episodes of dark tarry stools. PT states SOB at this time still. PT AO. Skin warm and dry at this time,    Allergies Allergies  Allergen Reactions  . Prednisone     Throat swelling   . Acetaminophen Diarrhea    Stomach pain  . Carbidopa-Levodopa     REACTION: headache, stomach pain, chest pain and seeing purple colors  . Ciprofloxacin   . Metoprolol     To low heart rate/ BP  . Povidone-Iodine   . Pravastatin Other (See Comments)    Muscle cramps  . Propoxyphene Hcl   . Sulfonamide Derivatives     "stop breathing, eyes roll in back of head and fingers web"  . Tetracycline     Level of Care/Admitting Diagnosis ED Disposition    ED Disposition Condition [facility] Area: [place] [[id number]]  Level of Care: Med-Surg [16]  Covid Evaluation: Confirmed COVID Negative  Diagnosis: Upper GI bleed [[id number]]  Admitting Physician: Dustin Flock [[id number]]  Attending Physician: Dustin Flock [[id number]]  Estimated length of stay: past midnight tomorrow  Certification:: I certify this patient will need inpatient services for at least 2 midnights  PT Class (Do Not Modify): Inpatient [101]  PT Acc Code (Do Not Modify): Private [1]       B Medical/Surgery History Past Medical History:  Diagnosis Date  . Allergy   . Anxiety   . Arthritis   . Fibrocystic breast   . Hyperlipidemia   . Hypertension   . Lipoma    5/01 Abdominal wall lipoma  . Loss of consciousness  ([place]) 12/15/2015  . Migraines   . Osteoporosis, unspecified   . Restless leg syndrome   . Stroke (cerebrum) [facility])    Past Surgical History:  Procedure Laterality Date  . APPENDECTOMY  1999  . BREAST CYST ASPIRATION    . CHOLECYSTECTOMY  1999  . DOBUTAMINE STRESS ECHO  06/96   negative  . TONSILLECTOMY AND ADENOIDECTOMY  1974  . TOTAL ABDOMINAL HYSTERECTOMY W/ BILATERAL SALPINGOOPHORECTOMY  1981     A IV Location/Drains/Wounds Patient Lines/Drains/Airways Status   Active Line/Drains/Airways    Name:   Placement date:   Placement time:   Site:   Days:   Peripheral IV 03/27/19 Right Antecubital   03/27/19    1350    Antecubital   less than 1          Intake/Output Last 24 hours No intake or output data in the 24 hours ending 03/27/19 1845  Labs/Imaging Results for orders placed or performed during the hospital encounter of 03/27/19 (from the past 48 hour(s))  CBC with Differential     Status: Abnormal   Collection Time: 03/27/19  3:06 PM  Result Value Ref Range   WBC 15.6 (H) 4.0 - 10.5 K/uL   RBC 3.43 (L) 3.87 - 5.11 MIL/uL   Hemoglobin 9.8 (L) 12.0 - 15.0 g/dL   HCT 30.7 (L) 36.0 - 46.0 %  MCV 89.5 80.0 - 100.0 fL   MCH 28.6 26.0 - 34.0 pg   MCHC 31.9 30.0 - 36.0 g/dL   RDW 13.8 11.5 - 15.5 %   Platelets 323 150 - 400 K/uL   nRBC 0.0 0.0 - 0.2 %   Neutrophils Relative % 82 %   Neutro Abs 12.6 (H) 1.7 - 7.7 K/uL   Lymphocytes Relative 12 %   Lymphs Abs 1.9 0.7 - 4.0 K/uL   Monocytes Relative 6 %   Monocytes Absolute 0.9 0.1 - 1.0 K/uL   Eosinophils Relative 0 %   Eosinophils Absolute 0.0 0.0 - 0.5 K/uL   Basophils Relative 0 %   Basophils Absolute 0.1 0.0 - 0.1 K/uL   Immature Granulocytes 0 %   Abs Immature Granulocytes 0.06 0.00 - 0.07 K/uL    Comment: Performed at [facility], [address] [place], [place] [address]  Comprehensive metabolic panel     Status: Abnormal   Collection Time: 03/27/19  3:06 PM  Result Value Ref Range   Sodium 139  135 - 145 mmol/L   Potassium 3.7 3.5 - 5.1 mmol/L   Chloride 107 98 - 111 mmol/L   CO2 25 22 - 32 mmol/L   Glucose, Bld 87 70 - 99 mg/dL   BUN 53 (H) 8 - 23 mg/dL   Creatinine, Ser 0.64 0.44 - 1.00 mg/dL   Calcium 9.5 8.9 - 10.3 mg/dL   Total Protein 6.3 (L) 6.5 - 8.1 g/dL   Albumin 3.6 3.5 - 5.0 g/dL   AST 16 15 - 41 U/L   ALT 17 0 - 44 U/L   Alkaline Phosphatase 73 38 - 126 U/L   Total Bilirubin 0.4 0.3 - 1.2 mg/dL   GFR calc non Af Amer >60 >60 mL/min   GFR calc Af Amer >60 >60 mL/min   Anion gap 7 5 - 15    Comment: Performed at [facility], [address] [place], [place] [address]  Protime-INR     Status: None   Collection Time: 03/27/19  3:06 PM  Result Value Ref Range   Prothrombin Time 13.0 11.4 - 15.2 seconds   INR 1.0 0.8 - 1.2    Comment: (NOTE) INR goal varies based on device and disease states. Performed at [facility], [address] [place], [place] [address]   Lipase, blood     Status: None   Collection Time: 03/27/19  3:06 PM  Result Value Ref Range   Lipase 42 11 - 51 U/L    Comment: Performed at [facility], [address] [place], [place] [address]  Type and screen [place]     Status: None (Preliminary result)   Collection Time: 03/27/19  3:06 PM  Result Value Ref Range   ABO/RH(D) PENDING    Antibody Screen PENDING    Sample Expiration      03/30/2019,2359 Performed at [facility], [address] [place],  [address]    Dg Abdomen Acute W/chest  Result Date: 03/27/2019 CLINICAL DATA:  Melena EXAM: DG ABDOMEN ACUTE W/ 1V CHEST COMPARISON:  None. FINDINGS: Cardiac shadows within normal limits. Lungs are mildly hyperinflated. Small hiatal hernia is noted. No focal infiltrate is seen. Scattered large and small bowel gas is noted. No obstructive changes are seen. No free air is noted. No acute bony abnormality is seen. IMPRESSION: No acute abnormality in the chest and abdomen.  Electronically Signed   By: Inez Catalina  M.D.   On: 03/27/2019 16:55    Pending Labs Unresulted Labs (From admission, onward)    Start     Ordered   03/27/19 1831  Hemoglobin  Now then every 8 hours,   STAT     03/27/19 1831   03/27/19 1630  Type and screen Ordered by PROVIDER DEFAULT  Once,   STAT     03/27/19 1630   Signed and Held  CBC  Tomorrow morning,   R     Signed and Held   Signed and Held  Basic metabolic panel  Tomorrow morning,   R     Signed and Held          Vitals/Pain Today's Vitals   03/27/19 1427 03/27/19 1428 03/27/19 1429 03/27/19 1700  BP:  (!) 164/90  (!) 163/78  Pulse:  82  92  Resp:  20  (!) 22  Temp:  98.1 F (36.7 C)    TempSrc:  Oral    SpO2: 98% 100%  100%  Weight:   77.1 kg   Height:   5\' 7"  (1.702 m)   PainSc:   4      Isolation Precautions No active isolations  Medications Medications  pantoprazole (PROTONIX) 80 mg in sodium chloride 0.9 % 250 mL (0.32 mg/mL) infusion (8 mg/hr Intravenous New Bag/Given 03/27/19 1756)  pantoprazole (PROTONIX) injection 40 mg (has no administration in time range)  ondansetron (ZOFRAN) injection 4 mg (has no administration in time range)  pantoprazole (PROTONIX) 80 mg in sodium chloride 0.9 % 100 mL IVPB (80 mg Intravenous New Bag/Given 03/27/19 1658)  0.9 %  sodium chloride infusion ( Intravenous New Bag/Given 03/27/19 1637)  morphine 4 MG/ML injection 4 mg (2 mg Intravenous Given 03/27/19 1709)    Mobility walks with device Low fall risk   Focused Assessments Cardiac Assessment Handoff:    No results found for: CKTOTAL, CKMB, CKMBINDEX, TROPONINI No results found for: DDIMER Does the Patient currently have chest pain? No      R Recommendations: See Admitting Provider Note  Report given to:   Additional Notes:

## 2019-03-28 ENCOUNTER — Other Ambulatory Visit: Payer: Medicare Other

## 2019-03-28 ENCOUNTER — Other Ambulatory Visit: Payer: Self-pay

## 2019-03-28 DIAGNOSIS — K922 Gastrointestinal hemorrhage, unspecified: Secondary | ICD-10-CM

## 2019-03-28 LAB — CBC
HCT: 23.9 % — ABNORMAL LOW (ref 36.0–46.0)
Hemoglobin: 7.8 g/dL — ABNORMAL LOW (ref 12.0–15.0)
MCH: 29.3 pg (ref 26.0–34.0)
MCHC: 32.6 g/dL (ref 30.0–36.0)
MCV: 89.8 fL (ref 80.0–100.0)
Platelets: 264 10*3/uL (ref 150–400)
RBC: 2.66 MIL/uL — ABNORMAL LOW (ref 3.87–5.11)
RDW: 14.2 % (ref 11.5–15.5)
WBC: 11.1 10*3/uL — ABNORMAL HIGH (ref 4.0–10.5)
nRBC: 0 % (ref 0.0–0.2)

## 2019-03-28 LAB — BASIC METABOLIC PANEL
Anion gap: 6 (ref 5–15)
BUN: 51 mg/dL — ABNORMAL HIGH (ref 8–23)
CO2: 22 mmol/L (ref 22–32)
Calcium: 8.1 mg/dL — ABNORMAL LOW (ref 8.9–10.3)
Chloride: 113 mmol/L — ABNORMAL HIGH (ref 98–111)
Creatinine, Ser: 0.7 mg/dL (ref 0.44–1.00)
GFR calc Af Amer: >60 mL/min (ref >60)
GFR calc non Af Amer: >60 mL/min (ref >60)
Glucose, Bld: 103 mg/dL — ABNORMAL HIGH (ref 70–99)
Potassium: 3.5 mmol/L (ref 3.5–5.1)
Sodium: 141 mmol/L (ref 135–145)

## 2019-03-28 LAB — CK TOTAL AND CKMB (NOT AT ARMC)
CK, MB: 1.9 ng/mL (ref 0.5–5.0)
Relative Index: INVALID (ref 0.0–2.5)
Total CK: 55 U/L (ref 38–234)

## 2019-03-28 LAB — MAGNESIUM: Magnesium: 2.1 mg/dL (ref 1.7–2.4)

## 2019-03-28 LAB — IRON AND TIBC
Iron: 90 ug/dL (ref 28–170)
Saturation Ratios: 35 % — ABNORMAL HIGH (ref 10.4–31.8)
TIBC: 261 ug/dL (ref 250–450)
UIBC: 171 ug/dL

## 2019-03-28 LAB — TROPONIN I (HIGH SENSITIVITY)
Troponin I (High Sensitivity): 8 ng/L (ref <18)
Troponin I (High Sensitivity): 9 ng/L (ref <18)

## 2019-03-28 LAB — ABO/RH: ABO/RH(D): B POS

## 2019-03-28 LAB — HEMOGLOBIN AND HEMATOCRIT, BLOOD
HCT: 20.3 % — ABNORMAL LOW (ref 36.0–46.0)
Hemoglobin: 6.4 g/dL — ABNORMAL LOW (ref 12.0–15.0)

## 2019-03-28 LAB — FOLATE: Folate: 24 ng/mL (ref >5.9)

## 2019-03-28 LAB — VITAMIN B12: Vitamin B-12: 174 pg/mL — ABNORMAL LOW (ref 180–914)

## 2019-03-28 LAB — PREPARE RBC (CROSSMATCH)

## 2019-03-28 LAB — PHOSPHORUS: Phosphorus: 2.1 mg/dL — ABNORMAL LOW (ref 2.5–4.6)

## 2019-03-28 LAB — FERRITIN: Ferritin: 39 ng/mL (ref 11–307)

## 2019-03-28 MED ORDER — SODIUM CHLORIDE 0.9% IV SOLUTION
Freq: Once | INTRAVENOUS | Status: AC
  Administered 2019-03-28: 18:00:00 via INTRAVENOUS

## 2019-03-28 MED ORDER — SODIUM PHOSPHATES 45 MMOLE/15ML IV SOLN
30.0000 mmol | Freq: Once | INTRAVENOUS | Status: AC
  Administered 2019-03-28: 15:00:00 30 mmol via INTRAVENOUS
  Filled 2019-03-28: qty 10

## 2019-03-28 MED ORDER — MORPHINE SULFATE (PF) 2 MG/ML IV SOLN
2.0000 mg | INTRAVENOUS | Status: DC | PRN
  Administered 2019-03-28 – 2019-04-01 (×7): 2 mg via INTRAVENOUS
  Filled 2019-03-28 (×7): qty 1

## 2019-03-28 MED ORDER — MORPHINE SULFATE (PF) 2 MG/ML IV SOLN
2.0000 mg | Freq: Once | INTRAVENOUS | Status: AC
  Administered 2019-03-28: 2 mg via INTRAVENOUS
  Filled 2019-03-28: qty 1

## 2019-03-28 NOTE — Plan of Care (Addendum)
Hemoglobin to be checked by night shift RN when second blood transfusion gets completed. On a clear liquid diet. No stool sample collected yet as the patient has not had a bowel movement. Hemoglobin levels have been dropping. Per Dr. Marius Ditch EGD to be performed thursday as the patient has been on Plavix.  Problem: Education: Goal: Knowledge of General Education information will improve Description: Including pain rating scale, medication(s)/side effects and non-pharmacologic comfort measures Outcome: Progressing   Problem: Health Behavior/Discharge Planning: Goal: Ability to manage health-related needs will improve Outcome: Progressing   Problem: Clinical Measurements: Goal: Ability to maintain clinical measurements within normal limits will improve Outcome: Progressing Goal: Will remain free from infection Outcome: Progressing Goal: Diagnostic test results will improve Outcome: Progressing Goal: Respiratory complications will improve Outcome: Progressing Goal: Cardiovascular complication will be avoided Outcome: Progressing   Problem: Activity: Goal: Risk for activity intolerance will decrease Outcome: Progressing   Problem: Nutrition: Goal: Adequate nutrition will be maintained Outcome: Progressing   Problem: Coping: Goal: Level of anxiety will decrease Outcome: Progressing   Problem: Elimination: Goal: Will not experience complications related to bowel motility Outcome: Progressing Goal: Will not experience complications related to urinary retention Outcome: Progressing   Problem: Pain Managment: Goal: General experience of comfort will improve Outcome: Progressing   Problem: Safety: Goal: Ability to remain free from injury will improve Outcome: Progressing   Problem: Skin Integrity: Goal: Risk for impaired skin integrity will decrease Outcome: Progressing

## 2019-03-28 NOTE — Progress Notes (Signed)
Advanced care plan. Purpose of the Encounter: CODE STATUS Parties in Attendance:Patient Patient's Decision Capacity:Good Subjective/Patient's story: Toni Berger  is a 73 y.o. female with a known history of hypertension, hyperlipidemia, restless leg syndrome, osteoarthritis of the knee who states that she has been having dark-colored stools since yesterday.  She has had multiple episodes of this.  Did not have any abdominal pain.  Patient does take 2 Advil per day.  She states that she did feel dizzy and weak today.  Denies any hematemesis denies any chest pain or shortness of breath. Objective/Medical story Patient has gastrointestinal bleeding.  Has low hemoglobin needs PRBC transfusions.  Needs gastroenterology evaluation and possible endoscopy.Needs IV protonix drip. Goals of care determination:  Patient does not want CPR, intubation ventilator if the need arises CODE STATUS: DNR Time spent discussing advanced care planning: 16 minutes

## 2019-03-28 NOTE — Progress Notes (Signed)
[facility] NAME: Dakoda Bassette    MR#:  [id number]  DATE OF BIRTH:  06-07-1946  SUBJECTIVE:  CHIEF COMPLAINT:   Chief Complaint  Patient presents with  . Melena  . Weakness  Patient seen and evaluated today Has dark stools Mild abdominal discomfort Also has some muscle spasms in the upper thighs Mild chest discomfort No shortness of breath  REVIEW OF SYSTEMS:    ROS  CONSTITUTIONAL: No documented fever. No fatigue, weakness. No weight gain, no weight loss.  EYES: No blurry or double vision.  ENT: No tinnitus. No postnasal drip. No redness of the oropharynx.  RESPIRATORY: No cough, no wheeze, no hemoptysis. No dyspnea.  CARDIOVASCULAR: mild chest pain. No orthopnea. No palpitations. No syncope.  GASTROINTESTINAL: No nausea, no vomiting or diarrhea. No abdominal pain.  Has melena  GENITOURINARY: No dysuria or hematuria.  ENDOCRINE: No polyuria or nocturia. No heat or cold intolerance.  HEMATOLOGY: No anemia. No bruising. No bleeding.  INTEGUMENTARY: No rashes. No lesions.  MUSCULOSKELETAL: No arthritis. No swelling. No gout.  NEUROLOGIC: No numbness, tingling, or ataxia. No seizure-type activity.  PSYCHIATRIC: No anxiety. No insomnia. No ADD.   DRUG ALLERGIES:   Allergies  Allergen Reactions  . Prednisone     Throat swelling   . Acetaminophen Diarrhea    Stomach pain  . Carbidopa-Levodopa     REACTION: headache, stomach pain, chest pain and seeing purple colors  . Ciprofloxacin   . Metoprolol     To low heart rate/ BP  . Povidone-Iodine   . Pravastatin Other (See Comments)    Muscle cramps  . Propoxyphene Hcl   . Sulfonamide Derivatives     "stop breathing, eyes roll in back of head and fingers web"  . Tetracycline     VITALS:  Blood pressure 137/72, pulse 84, temperature 98.2 F (36.8 C), temperature source Oral, resp. rate 18, height 5\' 7"  (1.702 m), weight 77.1 kg, SpO2 97 %.  PHYSICAL EXAMINATION:    Physical Exam  GENERAL:  73 y.o.-year-old patient lying in the bed with no acute distress.  EYES: Pupils equal, round, reactive to light and accommodation. No scleral icterus. Extraocular muscles intact.  HEENT: Head atraumatic, normocephalic. Oropharynx and nasopharynx clear.  Pallor present. NECK:  Supple, no jugular venous distention. No thyroid enlargement, no tenderness.  LUNGS: Normal breath sounds bilaterally, no wheezing, rales, rhonchi. No use of accessory muscles of respiration.  CARDIOVASCULAR: S1, S2 normal. No murmurs, rubs, or gallops.  ABDOMEN: Soft, nontender, nondistended. Bowel sounds present. No organomegaly or mass.  EXTREMITIES: No cyanosis, clubbing or edema b/l.    NEUROLOGIC: Cranial nerves II through XII are intact. No focal Motor or sensory deficits b/l.   PSYCHIATRIC: The patient is alert and oriented x 3.  SKIN: No obvious rash, lesion, or ulcer.   LABORATORY PANEL:   CBC Recent Labs  Lab 03/28/19 0228 03/28/19 1142  WBC 11.1*  --   HGB 7.8* 6.4*  HCT 23.9* 20.3*  PLT 264  --    ------------------------------------------------------------------------------------------------------------------ Chemistries  Recent Labs  Lab 03/27/19 1506 03/28/19 0228 03/28/19 1142  NA 139 141  --   K 3.7 3.5  --   CL 107 113*  --   CO2 25 22  --   GLUCOSE 87 103*  --   BUN 53* 51*  --   CREATININE 0.64 0.70  --   CALCIUM 9.5 8.1*  --   MG  --   --  2.1  AST 16  --   --   ALT 17  --   --   ALKPHOS 73  --   --   BILITOT 0.4  --   --    ------------------------------------------------------------------------------------------------------------------  Cardiac Enzymes No results for input(s): TROPONINI in the last 168 hours. ------------------------------------------------------------------------------------------------------------------  RADIOLOGY:  Dg Abdomen Acute W/chest  Result Date: 03/27/2019 CLINICAL DATA:  Melena EXAM: DG ABDOMEN ACUTE W/ 1V  CHEST COMPARISON:  None. FINDINGS: Cardiac shadows within normal limits. Lungs are mildly hyperinflated. Small hiatal hernia is noted. No focal infiltrate is seen. Scattered large and small bowel gas is noted. No obstructive changes are seen. No free air is noted. No acute bony abnormality is seen. IMPRESSION: No acute abnormality in the chest and abdomen. Electronically Signed   By: Inez Catalina M.D.   On: 03/27/2019 16:55     ASSESSMENT AND PLAN:   73 year old female patient with history of CVA on Plavix, hypertension, osteoporosis, chronic hip pain, chronic NSAID use presented to the emergency room for melena and anemia  -Acute gastrointestinal bleeding Continue IV Protonix drip Check iron studies Discontinue Plavix EGD in a.m. N.p.o. after midnight COVID-19 test negative Clear liquid diet for now N.p.o. after midnight  -Symptomatic anemia secondary to acute GI bleed Transfuse 2 units PRBC IV Posttransfusion hemoglobin hematocrit  -Atypical chest pain Troponin first set negative EKG normal sinus rhythm no ST segment changes Clinical monitoring  -Thigh pain Appears musculoskeletal Electrolyte check Check CK level   All the records are reviewed and case discussed with Care Management/Social Worker. Management plans discussed with the patient, family and they are in agreement.  CODE STATUS: DNR  DVT Prophylaxis: SCDs  TOTAL TIME TAKING CARE OF THIS PATIENT: 38 minutes.   POSSIBLE D/C IN 2 to 3 DAYS, DEPENDING ON CLINICAL CONDITION.  Saundra Shelling M.D on 03/28/2019 at 2:10 PM  Between 7am to 6pm - Pager - [phone]  After 6pm go to [email or web address] - password [email or web address] [facility]  Office  [phone]  CC: Primary care physician; Leone Haven, MD  Note: This dictation was prepared with Dragon dictation along with smaller phrase technology. Any transcriptional errors that result from this process are unintentional.

## 2019-03-28 NOTE — Progress Notes (Signed)
PT Cancellation Note  Patient Details Name: KEIRSTON SAEPHANH MRN: [id number] DOB: [birth year]   Cancelled Treatment:    Reason Eval/Treat Not Completed: Medical issues which prohibited therapy.  Pt's Hgb dropped from 9.0 yesterday to 7.8 this AM; MD contacted and requested PT hold this date.  Will attempt to see pt at a future date/time as medically appropriate.      Linus Salmons PT, DPT 03/28/19, 10:09 AM

## 2019-03-28 NOTE — Consult Note (Addendum)
Toni Darby, MD [address]  [place]  [place], [place] [address]  Main: [phone]  Fax: [phone] Pager: [phone]   Consultation  Referring Provider:     No ref. provider found Primary Care Physician:  Leone Haven, MD Primary Gastroenterologist: None         Reason for Consultation:     Melena, symptomatic anemia  Date of Admission:  03/27/2019 Date of Consultation:  03/28/2019         HPI:   Toni Berger is a 73 y.o. female with history of stroke on Plavix presented to ER yesterday with shortness of breath, black tarry stools, lightheadedness.  She was found to have severe anemia, 9.8, baseline 13.6 in 07/2018.  Normal MCV, normal platelets.  Patient has been taking Advil every 4-6 hours since 2017 for chronic hip pain.  She also reports bilateral thigh pain.  She does have history of restless leg syndrome but she says that this pain is different.  Her last dose of Plavix was yesterday morning.  She was hemodynamically stable on admission, BUN disproportionately elevated to creatinine, 53/0.64.  Patient denies drinking alcohol, denies smoking tobacco.  She lives alone.  She did not have further episodes of melena since admission.  She reports central chest pain.  She denies abdominal pain, nausea or vomiting.  Patient is n.p.o., on pantoprazole drip.  No known chronic liver disease.  LFTs, serum lipase normal.  Hemoglobin this morning dropped to 7.8, currently on maintenance IV fluids X-ray KUB did not reveal free air  NSAIDs: Advil since 2017 every 4-6 hours for chronic hip pain, trochanteric bursitis  Antiplts/Anticoagulants/Anti thrombotics: Plavix for history of stroke  GI Procedures: None  Past Medical History:  Diagnosis Date  . Allergy   . Anxiety   . Arthritis   . Fibrocystic breast   . Hyperlipidemia   . Hypertension   . Lipoma    5/01 Abdominal wall lipoma  . Loss of consciousness ([facility]) 12/15/2015  . Migraines   . Osteoporosis,  unspecified   . Restless leg syndrome   . Stroke (cerebrum) [facility])     Past Surgical History:  Procedure Laterality Date  . APPENDECTOMY  1999  . BREAST CYST ASPIRATION    . CHOLECYSTECTOMY  1999  . DOBUTAMINE STRESS ECHO  06/96   negative  . TONSILLECTOMY AND ADENOIDECTOMY  1974  . TOTAL ABDOMINAL HYSTERECTOMY W/ BILATERAL SALPINGOOPHORECTOMY  1981   Current Facility-Administered Medications:  .  0.9 %  sodium chloride infusion, , Intravenous, Continuous, Pyreddy, Pavan, MD, Last Rate: 75 mL/hr at 03/28/19 1019 .  acetaminophen (TYLENOL) tablet 650 mg, 650 mg, Oral, Q6H PRN **OR** acetaminophen (TYLENOL) suppository 650 mg, 650 mg, Rectal, Q6H PRN, Dustin Flock, MD .  cholecalciferol (VITAMIN D3) tablet 2,000 Units, 2,000 Units, Oral, Daily, Dustin Flock, MD, 2,000 Units at 03/28/19 0906 .  clonazePAM (KLONOPIN) tablet 1 mg, 1 mg, Oral, BID PRN, Dustin Flock, MD .  morphine 2 MG/ML injection 2 mg, 2 mg, Intravenous, Q4H PRN, Pyreddy, Pavan, MD, 2 mg at 03/28/19 1019 .  ondansetron (ZOFRAN) injection 4 mg, 4 mg, Intravenous, Once, Duffy Bruce, MD .  ondansetron [facility]) tablet 4 mg, 4 mg, Oral, Q6H PRN **OR** ondansetron (ZOFRAN) injection 4 mg, 4 mg, Intravenous, Q6H PRN, Dustin Flock, MD, 4 mg at 03/28/19 0215 .  pantoprazole (PROTONIX) 80 mg in sodium chloride 0.9 % 250 mL (0.32 mg/mL) infusion, 8 mg/hr, Intravenous, Continuous, Dustin Flock, MD, Last Rate:  25 mL/hr at 03/28/19 0400, 8 mg/hr at 03/28/19 0400 .  [START ON 03/31/2019] pantoprazole (PROTONIX) injection 40 mg, 40 mg, Intravenous, Q12H, Dustin Flock, MD .  traMADol (ULTRAM) tablet 50 mg, 50 mg, Oral, Q6H PRN, Dustin Flock, MD, 50 mg at 03/28/19 0831 .  vitamin B-12 (CYANOCOBALAMIN) tablet 1,000 mcg, 1,000 mcg, Oral, Daily, Dustin Flock, MD, 1,000 mcg at 03/28/19 7782    Family History  Problem Relation Age of Onset  . Heart disease Mother   . Diabetes Mother   . Aneurysm Mother        AAA   . Heart failure Mother   . Heart failure Father   . Coronary artery disease Father   . Colon cancer Neg Hx   . Breast cancer Neg Hx      Social History   Tobacco Use  . Smoking status: Former Research scientist (life sciences)  . Smokeless tobacco: Never Used  Substance Use Topics  . Alcohol use: No  . Drug use: No    Allergies as of 03/27/2019 - Review Complete 03/27/2019  Allergen Reaction Noted  . Prednisone  03/16/2019  . Acetaminophen Diarrhea 11/02/2016  . Carbidopa-levodopa  05/15/2009  . Ciprofloxacin  02/09/2008  . Metoprolol  03/27/2011  . Povidone-iodine  02/09/2008  . Pravastatin Other (See Comments) 04/15/2018  . Propoxyphene hcl  02/09/2008  . Sulfonamide derivatives  02/09/2008  . Tetracycline  02/09/2008    Review of Systems:    All systems reviewed and negative except where noted in HPI.   Physical Exam:  Vital signs in last 24 hours: Temp:  [98.1 F (36.7 C)-98.8 F (37.1 C)] 98.1 F (36.7 C) (07/14 0544) Pulse Rate:  [82-92] 84 (07/14 0544) Resp:  [18-22] 18 (07/14 0544) BP: (126-164)/(68-90) 126/72 (07/14 0544) SpO2:  [98 %-100 %] 98 % (07/14 0544) Weight:  [77.1 kg] 77.1 kg (07/13 1429) Last BM Date: 03/27/19 General:   Pleasant, cooperative in NAD Head:  Normocephalic and atraumatic. Eyes:   No icterus.   Conjunctiva pale. PERRLA. Ears:  Normal auditory acuity. Neck:  Supple; no masses or thyroidomegaly Lungs: Respirations even and unlabored. Lungs clear to auscultation bilaterally.   No wheezes, crackles, or rhonchi.  Heart:  Regular rate and rhythm;  Without murmur, clicks, rubs or gallops Abdomen:  Soft, nondistended, nontender. Normal bowel sounds. No appreciable masses or hepatomegaly.  No rebound or guarding.  Rectal:  Not performed. Msk:  Symmetrical without gross deformities Extremities:  Without edema, cyanosis or clubbing. Neurologic:  Alert and oriented x3;  grossly normal neurologically. Skin:  Intact without significant lesions or rashes. Cervical  Nodes:  No significant cervical adenopathy. Psych:  Alert and cooperative. Normal affect.  LAB RESULTS: CBC Latest Ref Rng & Units 03/28/2019 03/27/2019 03/27/2019  WBC 4.0 - 10.5 K/uL 11.1(H) - 15.6(H)  Hemoglobin 12.0 - 15.0 g/dL 7.8(L) 9.0(L) 9.8(L)  Hematocrit 36.0 - 46.0 % 23.9(L) - 30.7(L)  Platelets 150 - 400 K/uL 264 - 323    BMET BMP Latest Ref Rng & Units 03/28/2019 03/27/2019 08/10/2018  Glucose 70 - 99 mg/dL 103(H) 87 86  BUN 8 - 23 mg/dL 51(H) 53(H) 21  Creatinine 0.44 - 1.00 mg/dL 0.70 0.64 0.97  Sodium 135 - 145 mmol/L 141 139 138  Potassium 3.5 - 5.1 mmol/L 3.5 3.7 4.7  Chloride 98 - 111 mmol/L 113(H) 107 103  CO2 22 - 32 mmol/L '22 25 29  ' Calcium 8.9 - 10.3 mg/dL 8.1(L) 9.5 9.9    LFT Hepatic Function Latest Ref Rng &  Units 03/27/2019 08/10/2018 04/15/2018  Total Protein 6.5 - 8.1 g/dL 6.3(L) 7.7 7.6  Albumin 3.5 - 5.0 g/dL 3.6 4.4 4.3  AST 15 - 41 U/L '16 19 17  ' ALT 0 - 44 U/L '17 20 18  ' Alk Phosphatase 38 - 126 U/L 73 101 113  Total Bilirubin 0.3 - 1.2 mg/dL 0.4 0.4 0.5  Bilirubin, Direct 0.0 - 0.3 mg/dL - - -     STUDIES: Dg Abdomen Acute W/chest  Result Date: 03/27/2019 CLINICAL DATA:  Melena EXAM: DG ABDOMEN ACUTE W/ 1V CHEST COMPARISON:  None. FINDINGS: Cardiac shadows within normal limits. Lungs are mildly hyperinflated. Small hiatal hernia is noted. No focal infiltrate is seen. Scattered large and small bowel gas is noted. No obstructive changes are seen. No free air is noted. No acute bony abnormality is seen. IMPRESSION: No acute abnormality in the chest and abdomen. Electronically Signed   By: Inez Catalina M.D.   On: 03/27/2019 16:55      Impression / Plan:   YOLETTE HASTINGS is a 73 y.o. female with history of stroke on Plavix, hypertension, osteoporosis, chronic hip pain on chronic NSAID use admitted with severe symptomatic anemia and melena.  Most likely peptic ulcer disease in the setting of NSAID use and antiplatelet therapy.  Patient is also  complaining of chest pain  Melena, anemia Continue pantoprazole drip Monitor CBC every 6-8 hours and transfuse to maintain hemoglobin >8 given her history of stroke Okay with clear liquids Avoid NSAIDs Check iron studies, B12 and folate levels and replace if low Patient needs to be off Plavix for 3 to 5 days prior to performing EGD.  Last dose of Plavix was 7/13 a.m. EGD can be performed sooner if she has active GI bleed.  Tentative plan for Thursday this week  Chest pain Notified Dr. Estanislado Pandy May have demand ischemia, check EKG, troponins  Bilateral thigh pain Check total CK Normal potassium Check magnesium and phosphorus and replete if needed   Thank you for involving me in the care of this patient.  Will follow along with you    LOS: 1 day   Sherri Sear, MD  03/28/2019, 10:43 AM   Note: This dictation was prepared with Dragon dictation along with smaller phrase technology. Any transcriptional errors that result from this process are unintentional.

## 2019-03-28 NOTE — Progress Notes (Signed)
MD notified: Would you like to keep this patient on a clear liquid diet. DR. Marius Ditch indicated she will do EGD until thursday as the patient needs to be of plavix for 3 days.

## 2019-03-28 NOTE — Progress Notes (Signed)
MD notified: Hemoglobin level continues to drop. hemoglobin last night was 9.0 and this morning it is 7.8. The patient has not had any tarry stools since being admitted to the floor. Do you know if she will be having any procedures today?

## 2019-03-29 DIAGNOSIS — K921 Melena: Secondary | ICD-10-CM

## 2019-03-29 DIAGNOSIS — Z791 Long term (current) use of non-steroidal anti-inflammatories (NSAID): Secondary | ICD-10-CM

## 2019-03-29 LAB — BASIC METABOLIC PANEL
Anion gap: 6 (ref 5–15)
BUN: 30 mg/dL — ABNORMAL HIGH (ref 8–23)
CO2: 22 mmol/L (ref 22–32)
Calcium: 7.3 mg/dL — ABNORMAL LOW (ref 8.9–10.3)
Chloride: 115 mmol/L — ABNORMAL HIGH (ref 98–111)
Creatinine, Ser: 0.58 mg/dL (ref 0.44–1.00)
GFR calc Af Amer: >60 mL/min (ref >60)
GFR calc non Af Amer: >60 mL/min (ref >60)
Glucose, Bld: 94 mg/dL (ref 70–99)
Potassium: 3.1 mmol/L — ABNORMAL LOW (ref 3.5–5.1)
Sodium: 143 mmol/L (ref 135–145)

## 2019-03-29 LAB — CBC
HCT: 27.6 % — ABNORMAL LOW (ref 36.0–46.0)
Hemoglobin: 9.1 g/dL — ABNORMAL LOW (ref 12.0–15.0)
MCH: 29.1 pg (ref 26.0–34.0)
MCHC: 33 g/dL (ref 30.0–36.0)
MCV: 88.2 fL (ref 80.0–100.0)
Platelets: 224 10*3/uL (ref 150–400)
RBC: 3.13 MIL/uL — ABNORMAL LOW (ref 3.87–5.11)
RDW: 14.5 % (ref 11.5–15.5)
WBC: 11.9 10*3/uL — ABNORMAL HIGH (ref 4.0–10.5)
nRBC: 0 % (ref 0.0–0.2)

## 2019-03-29 LAB — BPAM RBC
Blood Product Expiration Date: 202008122359
Blood Product Expiration Date: 202008122359
ISSUE DATE / TIME: 202007141824
ISSUE DATE / TIME: 202007142348
Unit Type and Rh: 7300
Unit Type and Rh: 7300

## 2019-03-29 LAB — TYPE AND SCREEN
ABO/RH(D): B POS
Antibody Screen: NEGATIVE
Unit division: 0
Unit division: 0

## 2019-03-29 LAB — H PYLORI, IGM, IGG, IGA AB
H Pylori IgG: 0.7 Index Value (ref 0.00–0.79)
H. Pylogi, Iga Abs: <9 units (ref 0.0–8.9)
H. Pylogi, Igm Abs: <9 units (ref 0.0–8.9)

## 2019-03-29 LAB — HEMOGLOBIN
Hemoglobin: 9.3 g/dL — ABNORMAL LOW (ref 12.0–15.0)
Hemoglobin: 9.8 g/dL — ABNORMAL LOW (ref 12.0–15.0)

## 2019-03-29 LAB — PHOSPHORUS: Phosphorus: 2.9 mg/dL (ref 2.5–4.6)

## 2019-03-29 MED ORDER — SODIUM CHLORIDE 0.9 % IV SOLN
300.0000 mg | Freq: Once | INTRAVENOUS | Status: AC
  Administered 2019-03-29: 13:00:00 300 mg via INTRAVENOUS
  Filled 2019-03-29: qty 15

## 2019-03-29 MED ORDER — BOOST / RESOURCE BREEZE PO LIQD CUSTOM
1.0000 | Freq: Three times a day (TID) | ORAL | Status: DC
  Administered 2019-03-29 – 2019-03-30 (×3): 1 via ORAL

## 2019-03-29 MED ORDER — CALCIUM CARBONATE ANTACID 500 MG PO CHEW
1000.0000 mg | CHEWABLE_TABLET | Freq: Once | ORAL | Status: AC
  Administered 2019-03-29: 1000 mg via ORAL
  Filled 2019-03-29: qty 5

## 2019-03-29 MED ORDER — POTASSIUM CHLORIDE CRYS ER 20 MEQ PO TBCR
40.0000 meq | EXTENDED_RELEASE_TABLET | Freq: Once | ORAL | Status: AC
  Administered 2019-03-29: 40 meq via ORAL
  Filled 2019-03-29: qty 2

## 2019-03-29 MED ORDER — CALCIUM CARBONATE 1250 (500 CA) MG PO TABS
1000.0000 mg | ORAL_TABLET | Freq: Once | ORAL | Status: DC
  Filled 2019-03-29: qty 2

## 2019-03-29 NOTE — Progress Notes (Addendum)
Toni Darby, MD [address]  [place]  [place], [place] [address]  Main: [phone]  Fax: [phone] Pager: [phone]   Subjective: Patient denies having any episodes of melena since admission.  Her hemoglobin dropped to 6.8 yesterday, received 2 units of PRBCs.  She feels better today, energy levels have improved, sitting in chair.  She is tolerating clear liquid diet.  She reports upper abdominal pain.  Denies nausea or vomiting.  She reports that pain in her legs   Objective: Vital signs in last 24 hours: Vitals:   03/29/19 0018 03/29/19 0321 03/29/19 0444 03/29/19 1513  BP: (!) 143/73 (!) 152/81 (!) 150/82 135/79  Pulse: 80 83 81 72  Resp: 16 16 16 16   Temp: 98.9 F (37.2 C) 98.3 F (36.8 C) 98.1 F (36.7 C) 97.7 F (36.5 C)  TempSrc: Oral Oral Oral Oral  SpO2: 100% 98% 98% 100%  Weight:      Height:       Weight change:   Intake/Output Summary (Last 24 hours) at 03/29/2019 1515 Last data filed at 03/29/2019 1000 Gross per 24 hour  Intake 3239.19 ml  Output 300 ml  Net 2939.19 ml     Exam: Heart:: Regular rate and rhythm, S1S2 present or without murmur or extra heart sounds Lungs: normal and clear to auscultation Abdomen: soft, nontender, normal bowel sounds   Lab Results: CBC Latest Ref Rng & Units 03/29/2019 03/29/2019 03/28/2019  WBC 4.0 - 10.5 K/uL - 11.9(H) -  Hemoglobin 12.0 - 15.0 g/dL 9.3(L) 9.1(L) 6.4(L)  Hematocrit 36.0 - 46.0 % - 27.6(L) 20.3(L)  Platelets 150 - 400 K/uL - 224 -   CMP Latest Ref Rng & Units 03/29/2019 03/28/2019 03/27/2019  Glucose 70 - 99 mg/dL 94 103(H) 87  BUN 8 - 23 mg/dL 30(H) 51(H) 53(H)  Creatinine 0.44 - 1.00 mg/dL 0.58 0.70 0.64  Sodium 135 - 145 mmol/L 143 141 139  Potassium 3.5 - 5.1 mmol/L 3.1(L) 3.5 3.7  Chloride 98 - 111 mmol/L 115(H) 113(H) 107  CO2 22 - 32 mmol/L 22 22 25   Calcium 8.9 - 10.3 mg/dL 7.3(L) 8.1(L) 9.5  Total Protein 6.5 - 8.1 g/dL - - 6.3(L)  Total Bilirubin 0.3 - 1.2 mg/dL - -  0.4  Alkaline Phos 38 - 126 U/L - - 73  AST 15 - 41 U/L - - 16  ALT 0 - 44 U/L - - 17    Micro Results: Recent Results (from the past 240 hour(s))  SARS Coronavirus 2 (CEPHEID - Performed in [facility] hospital lab), Hosp Order     Status: None   Collection Time: 03/27/19  8:03 PM   Specimen: Nasopharyngeal Swab  Result Value Ref Range Status   SARS Coronavirus 2 NEGATIVE NEGATIVE Final    Comment: (NOTE) If result is NEGATIVE SARS-CoV-2 target nucleic acids are NOT DETECTED. The SARS-CoV-2 RNA is generally detectable in upper and lower  respiratory specimens during the acute phase of infection. The lowest  concentration of SARS-CoV-2 viral copies this assay can detect is 250  copies / mL. A negative result does not preclude SARS-CoV-2 infection  and should not be used as the sole basis for treatment or other  patient management decisions.  A negative result may occur with  improper specimen collection / handling, submission of specimen other  than nasopharyngeal swab, presence of viral mutation(s) within the  areas targeted by this assay, and inadequate number of viral copies  (<250 copies / mL).  A negative result must be combined with clinical  observations, patient history, and epidemiological information. If result is POSITIVE SARS-CoV-2 target nucleic acids are DETECTED. The SARS-CoV-2 RNA is generally detectable in upper and lower  respiratory specimens dur ing the acute phase of infection.  Positive  results are indicative of active infection with SARS-CoV-2.  Clinical  correlation with patient history and other diagnostic information is  necessary to determine patient infection status.  Positive results do  not rule out bacterial infection or co-infection with other viruses. If result is PRESUMPTIVE POSTIVE SARS-CoV-2 nucleic acids MAY BE PRESENT.   A presumptive positive result was obtained on the submitted specimen  and confirmed on repeat testing.  While 2019 novel  coronavirus  (SARS-CoV-2) nucleic acids may be present in the submitted sample  additional confirmatory testing may be necessary for epidemiological  and / or clinical management purposes  to differentiate between  SARS-CoV-2 and other Sarbecovirus currently known to infect humans.  If clinically indicated additional testing with an alternate test  methodology [id number]) is advised. The SARS-CoV-2 RNA is generally  detectable in upper and lower respiratory sp ecimens during the acute  phase of infection. The expected result is Negative. Fact Sheet for Patients:  StrictlyIdeas.no Fact Sheet for Healthcare Providers: BankingDealers.co.za This test is not yet approved or cleared by the [place] FDA and has been authorized for detection and/or diagnosis of SARS-CoV-2 by FDA under an Emergency Use Authorization (EUA).  This EUA will remain in effect (meaning this test can be used) for the duration of the COVID-19 declaration under Section 564(b)(1) of the Act, 21 U.S.C. section 360bbb-3(b)(1), unless the authorization is terminated or revoked sooner. Performed at [facility], [address] [place], [place] [address]    Studies/Results: Dg Abdomen Acute W/chest  Result Date: 03/27/2019 CLINICAL DATA:  Melena EXAM: DG ABDOMEN ACUTE W/ 1V CHEST COMPARISON:  None. FINDINGS: Cardiac shadows within normal limits. Lungs are mildly hyperinflated. Small hiatal hernia is noted. No focal infiltrate is seen. Scattered large and small bowel gas is noted. No obstructive changes are seen. No free air is noted. No acute bony abnormality is seen. IMPRESSION: No acute abnormality in the chest and abdomen. Electronically Signed   By: Inez Catalina M.D.   On: 03/27/2019 16:55   Medications:  I have reviewed the patient's current medications. Prior to Admission:  Medications Prior to Admission  Medication Sig Dispense Refill Last Dose  .  amLODipine (NORVASC) 5 MG tablet TAKE 1 TABLET DAILY (Patient taking differently: Take 5 mg by mouth daily. ) 90 tablet 3   . clonazePAM (KLONOPIN) 1 MG tablet TAKE 1 TABLET BY MOUTH EVERY 8 TO 12 HOURS AS NEEDED FOR ANXIETY (MAX 3 DOSES IN 24 HOURS) (Patient taking differently: Take 1 mg by mouth 2 (two) times daily as needed for anxiety. ) 90 tablet 0 Past Week at PRN  . clopidogrel (PLAVIX) 75 MG tablet TAKE 1 TABLET DAILY (Patient taking differently: Take 75 mg by mouth daily. ) 90 tablet 3   . ibuprofen (ADVIL) 200 MG tablet Take 200 mg by mouth every 6 (six) hours as needed for fever or mild pain.    Unknown at PRN  . lisinopril (PRINIVIL,ZESTRIL) 40 MG tablet TAKE 1 TABLET DAILY (Patient taking differently: Take 40 mg by mouth daily. ) 90 tablet 3   . Cholecalciferol (VITAMIN D3) 2000 units capsule Take 1 capsule (2,000 Units total) by mouth daily. 90 capsule 1   . Cyanocobalamin (B-12)  1000 MCG TABS Take 1 tablet by mouth daily. 90 tablet 0   . traMADol (ULTRAM) 50 MG tablet Take 1 tablet (50 mg total) by mouth every 6 (six) hours as needed. 10 tablet 0    Scheduled: . cholecalciferol  2,000 Units Oral Daily  . feeding supplement  1 Container Oral TID BM  . ondansetron (ZOFRAN) IV  4 mg Intravenous Once  . [START ON 03/31/2019] pantoprazole  40 mg Intravenous Q12H  . vitamin B-12  1,000 mcg Oral Daily   Continuous: . pantoprozole (PROTONIX) infusion 8 mg/hr (03/29/19 1316)   ULA:[id number] **OR** acetaminophen, clonazePAM, morphine injection, ondansetron **OR** ondansetron (ZOFRAN) IV, traMADol Anti-infectives (From admission, onward)   None     Scheduled Meds: . cholecalciferol  2,000 Units Oral Daily  . feeding supplement  1 Container Oral TID BM  . ondansetron (ZOFRAN) IV  4 mg Intravenous Once  . [START ON 03/31/2019] pantoprazole  40 mg Intravenous Q12H  . vitamin B-12  1,000 mcg Oral Daily   Continuous Infusions: . pantoprozole (PROTONIX) infusion 8 mg/hr (03/29/19  1316)   PRN Meds:.acetaminophen **OR** acetaminophen, clonazePAM, morphine injection, ondansetron **OR** ondansetron (ZOFRAN) IV, traMADol   Assessment: Active Problems:   Upper GI bleed  Melena history of heavy NSAID use, on Plavix hemoglobin responded appropriately to 2 units of PRBCs.  No active bleeding at this time BUN has significantly improved  Plan: Continue pantoprazole drip Okay with full liquid diet today Plan for EGD tomorrow N.p.o. past midnight Administer IV iron Continue to hold Plavix Can discontinue IV fluids, encourage p.o. hydration   LOS: 2 days   Rohini Vanga 03/29/2019, 3:15 PM

## 2019-03-29 NOTE — Evaluation (Signed)
Physical Therapy Evaluation Patient Details Name: Toni Berger MRN: [id number] DOB: [birth year] Today's Date: 03/29/2019   History of Present Illness  Per MD H&P 03/27/19: Pt is a 73 y.o. female with a known history of hypertension, hyperlipidemia, restless leg syndrome, osteoarthritis of the knee who states that she has been having dark-colored stools since yesterday.  She has had multiple episodes of this.  Did not have any abdominal pain.  Patient does take 2 Advil per day.  She states that she did feel dizzy and weak today.  Denies any hematemesis denies any chest pain or shortness of breath.  Assessment includes: Acute gastrointestinal bleeding, Symptomatic anemia secondary to acute GI bleed, Atypical chest pain, and thigh pain.  Chart review also reveals a recent diagnosis of Trochanteric bursitis of left hip as well as anxiety and depression.    Clinical Impression  Pt presents with deficits in strength, transfers, mobility, gait, balance, and activity tolerance.  Pt required extra time and effort and use of bed rail with sup to/from sit but did not need physical assistance.  Pt was min A with transfers from an elevated surface with cues for sequencing.  Pt was able to take several small, effortful steps from the EOB to the recliner with heavy lean on the RW and decreased LLE stance time.  Pt presents with a significant decline in functional mobility compared to her stated baseline and has no assistance at home.  Pt will benefit from PT services in a SNF setting upon discharge to safely address above deficits for decreased caregiver assistance and eventual return to PLOF.      Follow Up Recommendations SNF;Supervision for mobility/OOB    Equipment Recommendations  Other (comment)(TBD at next venue of care)    Recommendations for Other Services       Precautions / Restrictions Precautions Precautions: Fall Restrictions Weight Bearing Restrictions: No      Mobility  Bed  Mobility Overal bed mobility: Modified Independent             General bed mobility comments: Extra time and effort and use of the bed rail required  Transfers Overall transfer level: Needs assistance Equipment used: Rolling walker (2 wheeled) Transfers: Sit to/from Stand Sit to Stand: Min assist;From elevated surface         General transfer comment: Mod verbal cues for sequencing during transfer training  Ambulation/Gait Ambulation/Gait assistance: Min guard Gait Distance (Feet): 3 Feet Assistive device: Rolling walker (2 wheeled) Gait Pattern/deviations: Step-to pattern;Antalgic;Decreased stance time - left Gait velocity: decreased   General Gait Details: Effortful steps with heavy lean on the RW, slow cadence and decreased LLE stance time.  Stairs            Wheelchair Mobility    Modified Rankin (Stroke Patients Only)       Balance Overall balance assessment: Needs assistance   Sitting balance-Leahy Scale: Good     Standing balance support: Bilateral upper extremity supported Standing balance-Leahy Scale: Fair Standing balance comment: Heavy reliance on the RW in standing                             Pertinent Vitals/Pain Pain Assessment: 0-10 Pain Score: 7  Pain Location: L hip Pain Descriptors / Indicators: Aching;Sore Pain Intervention(s): Monitored during session;Premedicated before session    [place] expects to be discharged to:: Private residence Living Arrangements: Alone Available Help at Discharge: Other (Comment)(No assistance available) Type of  Home: House Home Access: Stairs to enter Entrance Stairs-Rails: None Entrance Stairs-Number of Steps: 2 Home Layout: One level Home Equipment: Carbon - 4 wheels;Walker - 2 wheels;Shower seat      Prior Function Level of Independence: Independent with assistive device(s)         Comments: Pt typically Ind with Amb without an AD but has been using a RW  recently due to L hip pain secondary to bursitis, Ind with ADLs, no fall history     Hand Dominance        Extremity/Trunk Assessment   Upper Extremity Assessment Upper Extremity Assessment: Overall WFL for tasks assessed    Lower Extremity Assessment Lower Extremity Assessment: Generalized weakness       Communication   Communication: No difficulties  Cognition Arousal/Alertness: Awake/alert Behavior During Therapy: WFL for tasks assessed/performed Overall Cognitive Status: Within Functional Limits for tasks assessed                                        General Comments      Exercises Total Joint Exercises Ankle Circles/Pumps: Strengthening;Both;10 reps;15 reps Quad Sets: Strengthening;Both;10 reps;15 reps Gluteal Sets: Strengthening;Both;10 reps;15 reps Long Arc Quad: Strengthening;Both;10 reps;15 reps Knee Flexion: Strengthening;Both;10 reps;15 reps Marching in Standing: AROM;Both;5 reps Other Exercises Other Exercises: HEP education/review for BLE APs, QS, GS, and LAQ x 10 each 5-6x/day   Assessment/Plan    PT Assessment Patient needs continued PT services  PT Problem List Decreased strength;Decreased activity tolerance;Decreased balance;Decreased mobility;Decreased knowledge of use of DME       PT Treatment Interventions DME instruction;Gait training;Stair training;Functional mobility training;Therapeutic activities;Therapeutic exercise;Balance training;Patient/family education    PT Goals (Current goals can be found in the Care Plan section)  Acute Rehab PT Goals Patient Stated Goal: To walk without pain PT Goal Formulation: With patient Time For Goal Achievement: 04/11/19 Potential to Achieve Goals: Good    Frequency Min 2X/week   Barriers to discharge Inaccessible home environment;Decreased caregiver support      Co-evaluation               AM-PAC PT "6 Clicks" Mobility  Outcome Measure Help needed turning from your  back to your side while in a flat bed without using bedrails?: A Little Help needed moving from lying on your back to sitting on the side of a flat bed without using bedrails?: A Little Help needed moving to and from a bed to a chair (including a wheelchair)?: A Little Help needed standing up from a chair using your arms (e.g., wheelchair or bedside chair)?: A Little Help needed to walk in hospital room?: A Lot Help needed climbing 3-5 steps with a railing? : Total 6 Click Score: 15    End of Session Equipment Utilized During Treatment: Gait belt Activity Tolerance: Patient tolerated treatment well Patient left: in chair;with call bell/phone within reach;with chair alarm set Nurse Communication: Mobility status PT Visit Diagnosis: Muscle weakness (generalized) (M62.81);Difficulty in walking, not elsewhere classified (R26.2)    Time: 1030-1109 PT Time Calculation (min) (ACUTE ONLY): 39 min   Charges:   PT Evaluation $PT Eval Low Complexity: 1 Low PT Treatments $Therapeutic Exercise: 8-22 mins        D. Scott Suvi Archuletta PT, DPT 03/29/19, 1:33 PM

## 2019-03-29 NOTE — Progress Notes (Signed)
[facility] [place] NAME: Toni Berger    MR#:  [id number]  DATE OF BIRTH:  22-Mar-1946  SUBJECTIVE:   Patient presented to the hospital due to melanotic stools and weakness and noted to have an upper GI bleed.  Hemoglobin improved posttransfusion is currently stable.  Patient still complaining of significant generalized weakness.  Plan for endoscopy tomorrow.  REVIEW OF SYSTEMS:    Review of Systems  Constitutional: Negative for chills and fever.  HENT: Negative for congestion and tinnitus.   Eyes: Negative for blurred vision and double vision.  Respiratory: Negative for cough, shortness of breath and wheezing.   Cardiovascular: Negative for chest pain, orthopnea and PND.  Gastrointestinal: Negative for abdominal pain, diarrhea, nausea and vomiting.  Genitourinary: Negative for dysuria and hematuria.  Neurological: Positive for weakness (generalized. ). Negative for dizziness, sensory change and focal weakness.  All other systems reviewed and are negative.   Nutrition: Full liquids Tolerating Diet: yes Tolerating PT: Await Eval.   DRUG ALLERGIES:   Allergies  Allergen Reactions  . Prednisone     Throat swelling   . Acetaminophen Diarrhea    Stomach pain  . Carbidopa-Levodopa     REACTION: headache, stomach pain, chest pain and seeing purple colors  . Ciprofloxacin   . Metoprolol     To low heart rate/ BP  . Povidone-Iodine   . Pravastatin Other (See Comments)    Muscle cramps  . Propoxyphene Hcl   . Sulfonamide Derivatives     "stop breathing, eyes roll in back of head and fingers web"  . Tetracycline     VITALS:  Blood pressure 135/79, pulse 72, temperature 97.7 F (36.5 C), temperature source Oral, resp. rate 16, height 5\' 7"  (1.702 m), weight 77.1 kg, SpO2 100 %.  PHYSICAL EXAMINATION:   Physical Exam  GENERAL:  73 y.o.-year-old patient lying in bed in no acute distress.  EYES: Pupils equal, round, reactive  to light and accommodation. No scleral icterus. Extraocular muscles intact.  HEENT: Head atraumatic, normocephalic. Oropharynx and nasopharynx clear.  NECK:  Supple, no jugular venous distention. No thyroid enlargement, no tenderness.  LUNGS: Normal breath sounds bilaterally, no wheezing, rales, rhonchi. No use of accessory muscles of respiration.  CARDIOVASCULAR: S1, S2 normal. No murmurs, rubs, or gallops.  ABDOMEN: Soft, nontender, nondistended. Bowel sounds present. No organomegaly or mass.  EXTREMITIES: No cyanosis, clubbing or edema b/l.    NEUROLOGIC: Cranial nerves II through XII are intact. No focal Motor or sensory deficits b/l. Globally weak.    PSYCHIATRIC: The patient is alert and oriented x 3.  SKIN: No obvious rash, lesion, or ulcer.    LABORATORY PANEL:   CBC Recent Labs  Lab 03/29/19 0545 03/29/19 1033  WBC 11.9*  --   HGB 9.1* 9.3*  HCT 27.6*  --   PLT 224  --    ------------------------------------------------------------------------------------------------------------------  Chemistries  Recent Labs  Lab 03/27/19 1506  03/28/19 1142 03/29/19 0545  NA 139   < >  --  143  K 3.7   < >  --  3.1*  CL 107   < >  --  115*  CO2 25   < >  --  22  GLUCOSE 87   < >  --  94  BUN 53*   < >  --  30*  CREATININE 0.64   < >  --  0.58  CALCIUM 9.5   < >  --  7.3*  MG  --   --  2.1  --   AST 16  --   --   --   ALT 17  --   --   --   ALKPHOS 73  --   --   --   BILITOT 0.4  --   --   --    < > = values in this interval not displayed.   ------------------------------------------------------------------------------------------------------------------  Cardiac Enzymes No results for input(s): TROPONINI in the last 168 hours. ------------------------------------------------------------------------------------------------------------------  RADIOLOGY:  Dg Abdomen Acute W/chest  Result Date: 03/27/2019 CLINICAL DATA:  Melena EXAM: DG ABDOMEN ACUTE W/ 1V CHEST  COMPARISON:  None. FINDINGS: Cardiac shadows within normal limits. Lungs are mildly hyperinflated. Small hiatal hernia is noted. No focal infiltrate is seen. Scattered large and small bowel gas is noted. No obstructive changes are seen. No free air is noted. No acute bony abnormality is seen. IMPRESSION: No acute abnormality in the chest and abdomen. Electronically Signed   By: Inez Catalina M.D.   On: 03/27/2019 16:55     ASSESSMENT AND PLAN:   73 year old female patient with history of CVA on Plavix, hypertension, osteoporosis, chronic hip pain, chronic NSAID use presented to the emergency room for melena and anemia  * Acute gastrointestinal bleeding-suspected to be upper GI bleed given patient's melanotic stools. - Hemoglobin improved posttransfusion and up to 9.3. -Continue IV Protonix drip.  Seen by gastroenterology and plan for endoscopy tomorrow. -Placed on full liquid diet and will make n.p.o. after midnight.  * Symptomatic anemia secondary to acute GI bleed Transfuse 2 units PRBC and Hg. Improved and will cont. To monitor.  -Iron studies consistent with iron deficiency.  Status post iron sucrose yesterday.  * Atypical chest pain Troponin first set negative EKG normal sinus rhythm no ST segment changes - cont. To monitor.   * Hypokalemia - will replace orally and repeat in a.m.  - check Mg. Level.   * Anxiety - cont. Klonopin.     All the records are reviewed and case discussed with Care Management/Social Worker. Management plans discussed with the patient, family and they are in agreement.  CODE STATUS: Full code  DVT Prophylaxis: Ted's & SCD's.   TOTAL TIME TAKING CARE OF THIS PATIENT: 30 minutes.   POSSIBLE D/C IN 1-2 DAYS, DEPENDING ON CLINICAL CONDITION.   Henreitta Leber M.D on 03/29/2019 at 3:57 PM  Between 7am to 6pm - Pager - [phone]  After 6pm go to [email or web address] - [employer] [facility]  Office  [phone]   CC: Primary care physician; Leone Haven, MD

## 2019-03-30 ENCOUNTER — Inpatient Hospital Stay: Payer: Medicare Other | Admitting: Anesthesiology

## 2019-03-30 ENCOUNTER — Encounter: Payer: Self-pay | Admitting: Anesthesiology

## 2019-03-30 ENCOUNTER — Encounter
Admission: EM | Disposition: A | Discharge status: Home / Self Care | Payer: Self-pay | Source: Home / Self Care | Attending: Specialist

## 2019-03-30 DIAGNOSIS — D509 Iron deficiency anemia, unspecified: Secondary | ICD-10-CM

## 2019-03-30 DIAGNOSIS — K449 Diaphragmatic hernia without obstruction or gangrene: Secondary | ICD-10-CM

## 2019-03-30 DIAGNOSIS — D62 Acute posthemorrhagic anemia: Secondary | ICD-10-CM

## 2019-03-30 DIAGNOSIS — K208 Other esophagitis: Secondary | ICD-10-CM

## 2019-03-30 DIAGNOSIS — K3189 Other diseases of stomach and duodenum: Secondary | ICD-10-CM

## 2019-03-30 HISTORY — PX: ESOPHAGOGASTRODUODENOSCOPY: SHX5428

## 2019-03-30 LAB — MAGNESIUM: Magnesium: 2 mg/dL (ref 1.7–2.4)

## 2019-03-30 LAB — CBC
HCT: 26.7 % — ABNORMAL LOW (ref 36.0–46.0)
Hemoglobin: 8.9 g/dL — ABNORMAL LOW (ref 12.0–15.0)
MCH: 29.6 pg (ref 26.0–34.0)
MCHC: 33.3 g/dL (ref 30.0–36.0)
MCV: 88.7 fL (ref 80.0–100.0)
Platelets: 236 10*3/uL (ref 150–400)
RBC: 3.01 MIL/uL — ABNORMAL LOW (ref 3.87–5.11)
RDW: 14.5 % (ref 11.5–15.5)
WBC: 10.5 10*3/uL (ref 4.0–10.5)
nRBC: 0.2 % (ref 0.0–0.2)

## 2019-03-30 LAB — HEMOGLOBIN
Hemoglobin: 8.6 g/dL — ABNORMAL LOW (ref 12.0–15.0)
Hemoglobin: 8.6 g/dL — ABNORMAL LOW (ref 12.0–15.0)

## 2019-03-30 LAB — POTASSIUM: Potassium: 3.4 mmol/L — ABNORMAL LOW (ref 3.5–5.1)

## 2019-03-30 SURGERY — EGD (ESOPHAGOGASTRODUODENOSCOPY)
Anesthesia: General

## 2019-03-30 MED ORDER — PROPOFOL 10 MG/ML IV BOLUS
INTRAVENOUS | Status: DC | PRN
  Administered 2019-03-30: 40 mg via INTRAVENOUS

## 2019-03-30 MED ORDER — PROPOFOL 500 MG/50ML IV EMUL
INTRAVENOUS | Status: DC | PRN
  Administered 2019-03-30: 130 ug/kg/min via INTRAVENOUS

## 2019-03-30 MED ORDER — LIDOCAINE HCL (PF) 2 % IJ SOLN
INTRAMUSCULAR | Status: DC | PRN
  Administered 2019-03-30: 100 mg via INTRADERMAL

## 2019-03-30 MED ORDER — FENTANYL CITRATE (PF) 100 MCG/2ML IJ SOLN
INTRAMUSCULAR | Status: AC
  Filled 2019-03-30: qty 2

## 2019-03-30 MED ORDER — SODIUM CHLORIDE 0.9 % IV SOLN
INTRAVENOUS | Status: DC
  Administered 2019-03-30: 10:00:00 via INTRAVENOUS

## 2019-03-30 MED ORDER — FENTANYL CITRATE (PF) 100 MCG/2ML IJ SOLN
INTRAMUSCULAR | Status: DC | PRN
  Administered 2019-03-30: 50 ug via INTRAVENOUS

## 2019-03-30 NOTE — Anesthesia Post-op Follow-up Note (Signed)
Anesthesia QCDR form completed.        

## 2019-03-30 NOTE — Progress Notes (Signed)
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  Patient denies any abdominal pain nausea or vomiting this morning.  REVIEW OF SYSTEMS:    Review of Systems  Constitutional: Negative for chills and fever.  HENT: Negative for congestion and tinnitus.   Eyes: Negative for blurred vision and double vision.  Respiratory: Negative for cough, shortness of breath and wheezing.   Cardiovascular: Negative for chest pain, orthopnea and PND.  Gastrointestinal: Negative for abdominal pain, diarrhea, nausea and vomiting.  Genitourinary: Negative for dysuria and hematuria.  Neurological: Positive for weakness (generalized. ). Negative for dizziness, sensory change and focal weakness.  All other systems reviewed and are negative.   Nutrition: Full liquids Tolerating Diet: yes Tolerating BJ:YNWG noted.  DRUG ALLERGIES:   Allergies  Allergen Reactions  . Carbidopa-Levodopa Other (See Comments)    REACTION: headache, stomach pain, chest pain and seeing purple colors  . Prednisone     Throat swelling   . Sulfonamide Derivatives     "stop breathing, eyes roll in back of head and fingers web"  . Ciprofloxacin   . Povidone-Iodine   . Propoxyphene Hcl   . Tetracycline   . Acetaminophen Diarrhea    Stomach pain  . Metoprolol Other (See Comments)    To low heart rate/ BP  . Pravastatin Other (See Comments)    Muscle cramps    VITALS:  Blood pressure (!) 143/69, pulse 82, temperature 98.8 F (37.1 C), temperature source Oral, resp. rate 16, height 5\' 7"  (1.702 m), weight 77.1 kg, SpO2 99 %.  PHYSICAL EXAMINATION:   Physical Exam  GENERAL:  73 y.o.-year-old patient lying in bed in no acute distress.  EYES: Pupils equal, round, reactive to light and  accommodation. No scleral icterus. Extraocular muscles intact. Pale Conjunctiva. HEENT: Head atraumatic, normocephalic. Oropharynx and nasopharynx clear.  NECK:  Supple, no jugular venous distention. No thyroid enlargement, no tenderness.  LUNGS: Normal breath sounds bilaterally, no wheezing, rales, rhonchi. No use of accessory muscles of respiration.  CARDIOVASCULAR: S1, S2 normal. No murmurs, rubs, or gallops.  ABDOMEN: Soft, nontender, nondistended. Bowel sounds present. No organomegaly or mass.  EXTREMITIES: No cyanosis, clubbing or edema b/l.    NEUROLOGIC: Cranial nerves II through XII are intact. No focal Motor or sensory deficits b/l.   PSYCHIATRIC: The patient is alert and oriented x 3.  SKIN: No obvious rash, lesion, or ulcer.    LABORATORY PANEL:   CBC Recent Labs  Lab 03/30/19 0226 03/30/19 1216  WBC 10.5  --   HGB 8.9* 8.6*  HCT 26.7*  --   PLT 236  --    ------------------------------------------------------------------------------------------------------------------  Chemistries  Recent Labs  Lab 03/27/19 1506  03/29/19 0545 03/30/19 0226  NA 139   < > 143  --   K 3.7   < > 3.1* 3.4*  CL 107   < > 115*  --   CO2 25   < > 22  --   GLUCOSE 87   < > 94  --   BUN 53*   < > 30*  --   CREATININE 0.64   < > 0.58  --   CALCIUM 9.5   < > 7.3*  --   MG  --    < >  --  2.0  AST 16  --   --   --   ALT 17  --   --   --   ALKPHOS 73  --   --   --   BILITOT 0.4  --   --   --    < > = values in this interval not displayed.   ------------------------------------------------------------------------------------------------------------------  Cardiac Enzymes No results for input(s): TROPONINI in the last 168 hours. ------------------------------------------------------------------------------------------------------------------  RADIOLOGY:  No results found.   ASSESSMENT AND PLAN:   73 year old female patient with history of CVA on Plavix, hypertension,  osteoporosis, chronic hip pain, chronic NSAID use presented to the emergency room for melena and anemia  * Acute gastrointestinal bleeding- suspected to be upper GI bleed given patient's melanotic stools. -Status post endoscopy showing AVMs which were cauterized by GI today.  Hemoglobin remained stable and will transfuse if is less than 7. -Continue Protonix.  Watch for any further bleeding post endoscopy today.  Discussed with gastroenterology. - Continue IV Protonix.  * Symptomatic anemia secondary to acute GI bleed Transfused 2 units PRBC and Hg. Improved and will cont. To monitor.  -Iron studies consistent with iron deficiency.  Status post iron sucrose. - follow Hg.   * Atypical chest pain Troponin first set negative EKG normal sinus rhythm no ST segment changes - cont. To monitor.   * Hypokalemia - improved with supplementation. Mg. Level was normal.   * Anxiety - cont. Klonopin.     All the records are reviewed and case discussed with Care Management/Social Worker. Management plans discussed with the patient, family and they are in agreement.  CODE STATUS: Full code  DVT Prophylaxis: Ted's & SCD's.   TOTAL TIME TAKING CARE OF THIS PATIENT: 30 minutes.   POSSIBLE D/C IN 1-2 DAYS, DEPENDING ON CLINICAL CONDITION.   Henreitta Leber M.D on 03/30/2019 at 1:55 PM  Between 7am to 6pm - Pager - [phone]  After 6pm go to [email or web address] - [employer] [facility]  Office  [phone]  CC: Primary care physician; Leone Haven, MD

## 2019-03-30 NOTE — Care Management Important Message (Signed)
Important Message  Patient Details  Name: Toni Berger MRN: [id number] Date of Birth: 09-27-1945   Medicare Important Message Given:  Yes     Dannette Barbara 03/30/2019, 12:49 PM

## 2019-03-30 NOTE — Anesthesia Postprocedure Evaluation (Signed)
Anesthesia Post Note  Patient: Toni Berger  Procedure(s) Performed: ESOPHAGOGASTRODUODENOSCOPY (EGD) (N/A )  Patient location during evaluation: Endoscopy Anesthesia Type: General Level of consciousness: awake and alert Pain management: pain level controlled Vital Signs Assessment: post-procedure vital signs reviewed and stable Respiratory status: spontaneous breathing, nonlabored ventilation, respiratory function stable and patient connected to nasal cannula oxygen Cardiovascular status: blood pressure returned to baseline and stable Postop Assessment: no apparent nausea or vomiting Anesthetic complications: no     Last Vitals:  Vitals:   03/30/19 1135 03/30/19 1213  BP: 120/64 (!) 143/69  Pulse: 86 82  Resp: 19 16  Temp:  37.1 C  SpO2: 95% 99%    Last Pain:  Vitals:   03/30/19 1213  TempSrc: Oral  PainSc:                  Shauntee Karp S

## 2019-03-30 NOTE — Transfer of Care (Signed)
Immediate Anesthesia Transfer of Care Note  Patient: Toni Berger  Procedure(s) Performed: ESOPHAGOGASTRODUODENOSCOPY (EGD) (N/A )  Patient Location: PACU  Anesthesia Type:General  Level of Consciousness: awake, alert  and oriented  Airway & Oxygen Therapy: Patient Spontanous Breathing and Patient connected to nasal cannula oxygen  Post-op Assessment: Report given to RN and Post -op Vital signs reviewed and stable  Post vital signs: Reviewed and stable  Last Vitals:  Vitals Value Taken Time  BP 120/64 03/30/19 1135  Temp    Pulse 87 03/30/19 1136  Resp 19 03/30/19 1136  SpO2 94 % 03/30/19 1136  Vitals shown include unvalidated device data.  Last Pain:  Vitals:   03/30/19 1002  TempSrc: Oral  PainSc:       Patients Stated Pain Goal: 0 (14/44/58 4835)  Complications: No apparent anesthesia complications

## 2019-03-30 NOTE — Op Note (Addendum)
[facility] Gastroenterology Patient Name: Toni Berger Procedure Date: 03/30/2019 11:00 AM MRN: [id number] Account #: [id number] Date of Birth: Mar 17, 1946 Admit Type: Inpatient Age: 73 Room: [facility] ENDO ROOM 3 Gender: Female Note Status: Finalized Procedure:            Upper GI endoscopy Indications:          Acute post hemorrhagic anemia, Iron deficiency anemia                        due to suspected upper gastrointestinal bleeding, Melena Providers:            Lin Landsman MD, MD Referring MD:         Angela Adam. Caryl Bis (Referring MD) Medicines:            Monitored Anesthesia Care Complications:        No immediate complications. Estimated blood loss: None. Procedure:            Pre-Anesthesia Assessment:                       - Prior to the procedure, a History and Physical was                        performed, and patient medications and allergies were                        reviewed. The patient is competent. The risks and                        benefits of the procedure and the sedation options and                        risks were discussed with the patient. All questions                        were answered and informed consent was obtained.                        Patient identification and proposed procedure were                        verified by the physician, the nurse, the                        anesthesiologist, the anesthetist and the technician in                        the pre-procedure area in the procedure room in the                        endoscopy suite. Mental Status Examination: alert and                        oriented. Airway Examination: normal oropharyngeal                        airway and neck mobility. Respiratory Examination:                        clear to auscultation. CV Examination: normal.  Prophylactic Antibiotics: The patient does not require                        prophylactic antibiotics. Prior  Anticoagulants: The                        patient has taken Plavix (clopidogrel), last dose was 3                        days prior to procedure. ASA Grade Assessment: III - A                        patient with severe systemic disease. After reviewing                        the risks and benefits, the patient was deemed in                        satisfactory condition to undergo the procedure. The                        anesthesia plan was to use monitored anesthesia care                        (MAC). Immediately prior to administration of                        medications, the patient was re-assessed for adequacy                        to receive sedatives. The heart rate, respiratory rate,                        oxygen saturations, blood pressure, adequacy of                        pulmonary ventilation, and response to care were                        monitored throughout the procedure. The physical status                        of the patient was re-assessed after the procedure.                       After obtaining informed consent, the endoscope was                        passed under direct vision. Throughout the procedure,                        the patient's blood pressure, pulse, and oxygen                        saturations were monitored continuously. The Endoscope                        was introduced through the mouth, and advanced to the  second part of duodenum. The upper GI endoscopy was                        accomplished without difficulty. The patient tolerated                        the procedure well. Findings:      Two 5 mm angioectasias with bleeding were found in the duodenal sweep.       Vaporization for hemostasis using bipolar probe was successful.       Estimated blood loss was minimal.      A few dispersed, small non-bleeding erosions were found in the gastric       body and in the gastric antrum. There were no stigmata of recent        bleeding.      A 1 cm hiatal hernia was present.      LA Grade A (one or more mucosal breaks less than 5 mm, not extending       between tops of 2 mucosal folds) esophagitis with no bleeding was found       in the lower third of the esophagus. 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 Treated                        with bipolar cautery.                       - Non-bleeding erosive gastropathy.                       - 1 cm hiatal hernia.                       - LA Grade A erosive esophagitis.                       - No specimens collected. Recommendation:       - Return patient to hospital ward for ongoing care.                       - Cardiac diet today.                       - Continue present medications.                       - Return to GI clinic in 2 weeks.                       - Continue to hold plavix                       - Strictly avoid NSAIDs                       - Use Prilosec (omeprazole) 40 mg PO BID for 3 months. Procedure Code(s):    --- Professional ---                       [phone], Esophagogastroduodenoscopy, flexible, transoral;  with control of bleeding, any method Diagnosis Code(s):    --- Professional ---                       Y30.160, Angiodysplasia of stomach and duodenum with                        bleeding                       K31.89, Other diseases of stomach and duodenum                       K44.9, Diaphragmatic hernia without obstruction or                        gangrene                       K20.8, Other esophagitis                       D62, Acute posthemorrhagic anemia                       D50.9, Iron deficiency anemia, unspecified                       K92.1, Melena (includes Hematochezia) CPT copyright 2019 American Medical Association. All rights reserved. The codes documented in this report are preliminary and upon coder review may  be revised to meet current compliance requirements. Dr. Ulyess Mort Lin Landsman MD, MD 03/30/2019 11:34:10 AM This report has been signed electronically. Number of Addenda: 0 Note Initiated On: 03/30/2019 11:00 AM Estimated Blood Loss: Estimated blood loss: none.      [facility]

## 2019-03-30 NOTE — Progress Notes (Signed)
Patient lives alone and is worried how she will return home. Patient has reached out to a friend who is also her POA to pick her up from the hospital once she has been discharged. Patient stated she has no family to assist with errands or other duties at home. Patient is very anxious and emotional about not being able to pick up any needed medications after discharge. Patient also stated she does not want to go to a Rehab facility or have home health. I asked why she would refuse home health and she stated she didn't trust any outside personnel with the [facility]. I explained to the patient that proper precautions are taken with home health personnel and encouraged her to reconsider to have home health assist her.   Fuller Mandril, RN

## 2019-03-30 NOTE — Anesthesia Procedure Notes (Signed)
Date/Time: 03/30/2019 11:25 AM Performed by: Nelda Marseille, CRNA Pre-anesthesia Checklist: Patient identified, Emergency Drugs available, Suction available, Patient being monitored and Timeout performed Oxygen Delivery Method: Nasal cannula

## 2019-03-30 NOTE — Anesthesia Preprocedure Evaluation (Signed)
Anesthesia Evaluation  Patient identified by MRN, date of birth, ID band Patient awake    Reviewed: Allergy & Precautions, NPO status , Patient's Chart, lab work & pertinent test results, reviewed documented beta blocker date and time   Airway Mallampati: II  TM Distance: >3 FB     Dental  (+) Chipped   Pulmonary former smoker,           Cardiovascular hypertension, Pt. on medications      Neuro/Psych  Headaches, PSYCHIATRIC DISORDERS Anxiety Depression  Neuromuscular disease CVA    GI/Hepatic hiatal hernia,   Endo/Other    Renal/GU      Musculoskeletal  (+) Arthritis ,   Abdominal   Peds  Hematology   Anesthesia Other Findings Hb 8.9. 2 u of blood given. EF 55-60 2 yr ago. EKG now ok. SEVERE ALLERGY TO PREDNISONE.  Reproductive/Obstetrics                             Anesthesia Physical Anesthesia Plan  ASA: III  Anesthesia Plan: General   Post-op Pain Management:    Induction: Intravenous  PONV Risk Score and Plan:   Airway Management Planned:   Additional Equipment:   Intra-op Plan:   Post-operative Plan:   Informed Consent: I have reviewed the patients History and Physical, chart, labs and discussed the procedure including the risks, benefits and alternatives for the proposed anesthesia with the patient or authorized representative who has indicated his/her understanding and acceptance.       Plan Discussed with: CRNA  Anesthesia Plan Comments:         Anesthesia Quick Evaluation

## 2019-03-30 NOTE — Progress Notes (Signed)
Patient has been NPO since MN.

## 2019-03-31 ENCOUNTER — Encounter
Admission: EM | Disposition: A | Discharge status: Home / Self Care | Payer: Self-pay | Source: Home / Self Care | Attending: Specialist

## 2019-03-31 ENCOUNTER — Encounter: Payer: Self-pay | Admitting: Gastroenterology

## 2019-03-31 DIAGNOSIS — K5521 Angiodysplasia of colon with hemorrhage: Secondary | ICD-10-CM

## 2019-03-31 DIAGNOSIS — K922 Gastrointestinal hemorrhage, unspecified: Secondary | ICD-10-CM | POA: Diagnosis not present

## 2019-03-31 HISTORY — PX: GIVENS CAPSULE STUDY: SHX5432

## 2019-03-31 LAB — CBC
HCT: 25.5 % — ABNORMAL LOW (ref 36.0–46.0)
Hemoglobin: 8.2 g/dL — ABNORMAL LOW (ref 12.0–15.0)
MCH: 29.4 pg (ref 26.0–34.0)
MCHC: 32.2 g/dL (ref 30.0–36.0)
MCV: 91.4 fL (ref 80.0–100.0)
Platelets: 261 10*3/uL (ref 150–400)
RBC: 2.79 MIL/uL — ABNORMAL LOW (ref 3.87–5.11)
RDW: 14.6 % (ref 11.5–15.5)
WBC: 12.1 10*3/uL — ABNORMAL HIGH (ref 4.0–10.5)
nRBC: 0 % (ref 0.0–0.2)

## 2019-03-31 LAB — BASIC METABOLIC PANEL
Anion gap: 8 (ref 5–15)
BUN: 19 mg/dL (ref 8–23)
CO2: 24 mmol/L (ref 22–32)
Calcium: 8 mg/dL — ABNORMAL LOW (ref 8.9–10.3)
Chloride: 108 mmol/L (ref 98–111)
Creatinine, Ser: 0.62 mg/dL (ref 0.44–1.00)
GFR calc Af Amer: >60 mL/min (ref >60)
GFR calc non Af Amer: >60 mL/min (ref >60)
Glucose, Bld: 104 mg/dL — ABNORMAL HIGH (ref 70–99)
Potassium: 3.1 mmol/L — ABNORMAL LOW (ref 3.5–5.1)
Sodium: 140 mmol/L (ref 135–145)

## 2019-03-31 LAB — HEMOGLOBIN
Hemoglobin: 8.5 g/dL — ABNORMAL LOW (ref 12.0–15.0)
Hemoglobin: 9 g/dL — ABNORMAL LOW (ref 12.0–15.0)

## 2019-03-31 SURGERY — IMAGING PROCEDURE, GI TRACT, INTRALUMINAL, VIA CAPSULE

## 2019-03-31 MED ORDER — ACETAMINOPHEN 325 MG PO TABS
650.0000 mg | ORAL_TABLET | Freq: Four times a day (QID) | ORAL | Status: DC | PRN
  Filled 2019-03-31: qty 2

## 2019-03-31 MED ORDER — POTASSIUM CHLORIDE CRYS ER 20 MEQ PO TBCR
40.0000 meq | EXTENDED_RELEASE_TABLET | Freq: Once | ORAL | Status: AC
  Administered 2019-03-31: 20:00:00 40 meq via ORAL
  Filled 2019-03-31: qty 2

## 2019-03-31 MED ORDER — MAGNESIUM CITRATE PO SOLN
1.0000 | Freq: Once | ORAL | Status: AC
  Administered 2019-03-31: 1 via ORAL
  Filled 2019-03-31: qty 296

## 2019-03-31 MED ORDER — ACETAMINOPHEN 650 MG RE SUPP: 650.0000 mg | Freq: Four times a day (QID) | RECTAL | Status: DC | PRN

## 2019-03-31 NOTE — Progress Notes (Signed)
PT Cancellation Note  Patient Details Name: Toni Berger MRN: [id number] DOB: 1946/03/17   Cancelled Treatment:    Reason Eval/Treat Not Completed: Patient at procedure or test/unavailable.  Pt currently out of room.  Will re-attempt when time allows.  Roxanne Gates, PT, DPT  Roxanne Gates 03/31/2019, 11:40 AM

## 2019-03-31 NOTE — Progress Notes (Signed)
Toni Darby, MD [address]  [place]  [place], [place] [address]  Main: [phone]  Fax: [phone] Pager: [phone]   Subjective: Patient reports having one episode of black tarry stool yesterday around 8 PM.  Her hemoglobin dropped to 8.2 today from 9.8, 2 days ago.  She was ready to get discharged home this morning.   Objective: Vital signs in last 24 hours: Vitals:   03/30/19 1213 03/30/19 1957 03/31/19 0533 03/31/19 1353  BP: (!) 143/69 (!) 159/85 (!) 157/77 140/72  Pulse: 82 (!) 101 88 78  Resp: 16 20 16    Temp: 98.8 F (37.1 C) 98.3 F (36.8 C) 99.2 F (37.3 C) 98.8 F (37.1 C)  TempSrc: Oral Oral Oral Oral  SpO2: 99% 100% 95% 99%  Weight:      Height:       Weight change:   Intake/Output Summary (Last 24 hours) at 03/31/2019 1542 Last data filed at 03/31/2019 0533 Gross per 24 hour  Intake 0 ml  Output 1300 ml  Net -1300 ml     Exam: Heart:: Regular rate and rhythm, S1S2 present or without murmur or extra heart sounds Lungs: normal and clear to auscultation Abdomen: soft, nontender, normal bowel sounds   Lab Results: CBC Latest Ref Rng & Units 03/31/2019 03/31/2019 03/30/2019  WBC 4.0 - 10.5 K/uL - 12.1(H) -  Hemoglobin 12.0 - 15.0 g/dL 8.5(L) 8.2(L) 8.6(L)  Hematocrit 36.0 - 46.0 % - 25.5(L) -  Platelets 150 - 400 K/uL - 261 -   CMP Latest Ref Rng & Units 03/31/2019 03/30/2019 03/29/2019  Glucose 70 - 99 mg/dL 104(H) - 94  BUN 8 - 23 mg/dL 19 - 30(H)  Creatinine 0.44 - 1.00 mg/dL 0.62 - 0.58  Sodium 135 - 145 mmol/L 140 - 143  Potassium 3.5 - 5.1 mmol/L 3.1(L) 3.4(L) 3.1(L)  Chloride 98 - 111 mmol/L 108 - 115(H)  CO2 22 - 32 mmol/L 24 - 22  Calcium 8.9 - 10.3 mg/dL 8.0(L) - 7.3(L)  Total Protein 6.5 - 8.1 g/dL - - -  Total Bilirubin 0.3 - 1.2 mg/dL - - -  Alkaline Phos 38 - 126 U/L - - -  AST 15 - 41 U/L - - -  ALT 0 - 44 U/L - - -    Micro Results: Recent Results (from the past 240 hour(s))  SARS Coronavirus 2 (CEPHEID -  Performed in [place] hospital lab), Hosp Order     Status: None   Collection Time: 03/27/19  8:03 PM   Specimen: Nasopharyngeal Swab  Result Value Ref Range Status   SARS Coronavirus 2 NEGATIVE NEGATIVE Final    Comment: (NOTE) If result is NEGATIVE SARS-CoV-2 target nucleic acids are NOT DETECTED. The SARS-CoV-2 RNA is generally detectable in upper and lower  respiratory specimens during the acute phase of infection. The lowest  concentration of SARS-CoV-2 viral copies this assay can detect is 250  copies / mL. A negative result does not preclude SARS-CoV-2 infection  and should not be used as the sole basis for treatment or other  patient management decisions.  A negative result may occur with  improper specimen collection / handling, submission of specimen other  than nasopharyngeal swab, presence of viral mutation(s) within the  areas targeted by this assay, and inadequate number of viral copies  (<250 copies / mL). A negative result must be combined with clinical  observations, patient history, and epidemiological information. If result is POSITIVE SARS-CoV-2 target nucleic acids  are DETECTED. The SARS-CoV-2 RNA is generally detectable in upper and lower  respiratory specimens dur ing the acute phase of infection.  Positive  results are indicative of active infection with SARS-CoV-2.  Clinical  correlation with patient history and other diagnostic information is  necessary to determine patient infection status.  Positive results do  not rule out bacterial infection or co-infection with other viruses. If result is PRESUMPTIVE POSTIVE SARS-CoV-2 nucleic acids MAY BE PRESENT.   A presumptive positive result was obtained on the submitted specimen  and confirmed on repeat testing.  While 2019 novel coronavirus  (SARS-CoV-2) nucleic acids may be present in the submitted sample  additional confirmatory testing may be necessary for epidemiological  and / or clinical management  purposes  to differentiate between  SARS-CoV-2 and other Sarbecovirus currently known to infect humans.  If clinically indicated additional testing with an alternate test  methodology [phone]) is advised. The SARS-CoV-2 RNA is generally  detectable in upper and lower respiratory sp ecimens during the acute  phase of infection. The expected result is Negative. Fact Sheet for Patients:  StrictlyIdeas.no Fact Sheet for Healthcare Providers: BankingDealers.co.za This test is not yet approved or cleared by the [place] FDA and has been authorized for detection and/or diagnosis of SARS-CoV-2 by FDA under an Emergency Use Authorization (EUA).  This EUA will remain in effect (meaning this test can be used) for the duration of the COVID-19 declaration under Section 564(b)(1) of the Act, 21 U.S.C. section 360bbb-3(b)(1), unless the authorization is terminated or revoked sooner. Performed at [facility], [address] [place], [place] [address]    Studies/Results: No results found. Medications:  I have reviewed the patient's current medications. Prior to Admission:  Medications Prior to Admission  Medication Sig Dispense Refill Last Dose  . amLODipine (NORVASC) 5 MG tablet TAKE 1 TABLET DAILY (Patient taking differently: Take 5 mg by mouth daily. ) 90 tablet 3 03/27/2019  . clonazePAM (KLONOPIN) 1 MG tablet TAKE 1 TABLET BY MOUTH EVERY 8 TO 12 HOURS AS NEEDED FOR ANXIETY (MAX 3 DOSES IN 24 HOURS) (Patient taking differently: Take 1 mg by mouth 2 (two) times daily as needed for anxiety. ) 90 tablet 0 Past Week at PRN  . clopidogrel (PLAVIX) 75 MG tablet TAKE 1 TABLET DAILY (Patient taking differently: Take 75 mg by mouth daily. ) 90 tablet 3 03/27/2019  . ibuprofen (ADVIL) 200 MG tablet Take 200 mg by mouth every 6 (six) hours as needed for fever or mild pain.    Past Week at PRN  . lisinopril (PRINIVIL,ZESTRIL) 40 MG tablet TAKE 1  TABLET DAILY (Patient taking differently: Take 40 mg by mouth daily. ) 90 tablet 3 03/27/2019  . Cholecalciferol (VITAMIN D3) 2000 units capsule Take 1 capsule (2,000 Units total) by mouth daily. 90 capsule 1 03/27/2019  . Cyanocobalamin (B-12) 1000 MCG TABS Take 1 tablet by mouth daily. 90 tablet 0   . traMADol (ULTRAM) 50 MG tablet Take 1 tablet (50 mg total) by mouth every 6 (six) hours as needed. 10 tablet 0    Scheduled: . cholecalciferol  2,000 Units Oral Daily  . feeding supplement  1 Container Oral TID BM  . ondansetron (ZOFRAN) IV  4 mg Intravenous Once  . pantoprazole  40 mg Intravenous Q12H  . potassium chloride  40 mEq Oral Once  . vitamin B-12  1,000 mcg Oral Daily   Continuous:  HYI:FOYDXAJOINOMV **OR** acetaminophen, clonazePAM, morphine injection, ondansetron **OR** ondansetron (ZOFRAN) IV, traMADol  Anti-infectives (From admission, onward)   None     Scheduled Meds: . cholecalciferol  2,000 Units Oral Daily  . feeding supplement  1 Container Oral TID BM  . ondansetron (ZOFRAN) IV  4 mg Intravenous Once  . pantoprazole  40 mg Intravenous Q12H  . potassium chloride  40 mEq Oral Once  . 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  However, patient's hemoglobin further dropped today concerning for ongoing GI bleed and possibility of further small bowel AVMs  Plan: Recommend video capsule endoscopy today, placed Full liquid diet Monitor CBC closely If the capsule study is negative, can be discharged home tomorrow with GI follow-up as outpatient Continue to hold Plavix and strictly avoid regular use of NSAIDs  Dr. Bonna Gains to cover for the weekend   LOS: 4 days    Rohini Vanga 03/31/2019, 3:42 PM

## 2019-03-31 NOTE — Progress Notes (Signed)
[facility] [place] NAME: Toni Berger    MR#:  [id number]  DATE OF BIRTH:  23-Apr-1946  SUBJECTIVE:   Patient's hemoglobin had drifted down this morning and seen by gastroenterology and plan for capsule endoscopy today.  Patient denies any worsening melena or bleeding.  She is hemodynamically stable.  REVIEW OF SYSTEMS:    Review of Systems  Constitutional: Negative for chills and fever.  HENT: Negative for congestion and tinnitus.   Eyes: Negative for blurred vision and double vision.  Respiratory: Negative for cough, shortness of breath and wheezing.   Cardiovascular: Negative for chest pain, orthopnea and PND.  Gastrointestinal: Negative for abdominal pain, diarrhea, nausea and vomiting.  Genitourinary: Negative for dysuria and hematuria.  Neurological: Positive for weakness (generalized. ). Negative for dizziness, sensory change and focal weakness.  All other systems reviewed and are negative.   Nutrition: NPO for capsule endoscopy Tolerating Diet: No Tolerating YQ:MVHQ noted.  DRUG ALLERGIES:   Allergies  Allergen Reactions  . Carbidopa-Levodopa Other (See Comments)    REACTION: headache, stomach pain, chest pain and seeing purple colors  . Prednisone     Throat swelling   . Sulfonamide Derivatives     "stop breathing, eyes roll in back of head and fingers web"  . Ciprofloxacin   . Povidone-Iodine   . Propoxyphene Hcl   . Tetracycline   . Acetaminophen Diarrhea    Stomach pain  . Metoprolol Other (See Comments)    To low heart rate/ BP  . Pravastatin Other (See Comments)    Muscle cramps    VITALS:  Blood pressure (!) 157/77, pulse 88, temperature 99.2 F (37.3 C), temperature source Oral, resp. rate 16, height 5\' 7"  (1.702 m), weight 77.1 kg, SpO2 95 %.  PHYSICAL EXAMINATION:   Physical Exam  GENERAL:  73 y.o.-year-old patient sitting up in bed brushing her teeth in NAD.  EYES: Pupils equal, round,  reactive to light and accommodation. No scleral icterus. Extraocular muscles intact. Pale Conjunctiva. HEENT: Head atraumatic, normocephalic. Oropharynx and nasopharynx clear.  NECK:  Supple, no jugular venous distention. No thyroid enlargement, no tenderness.  LUNGS: Normal breath sounds bilaterally, no wheezing, rales, rhonchi. No use of accessory muscles of respiration.  CARDIOVASCULAR: S1, S2 normal. No murmurs, rubs, or gallops.  ABDOMEN: Soft, nontender, nondistended. Bowel sounds present. No organomegaly or mass.  EXTREMITIES: No cyanosis, clubbing or edema b/l.    NEUROLOGIC: Cranial nerves II through XII are intact. No focal Motor or sensory deficits b/l.   PSYCHIATRIC: The patient is alert and oriented x 3.  SKIN: No obvious rash, lesion, or ulcer.    LABORATORY PANEL:   CBC Recent Labs  Lab 03/31/19 0216 03/31/19 1336  WBC 12.1*  --   HGB 8.2* 8.5*  HCT 25.5*  --   PLT 261  --    ------------------------------------------------------------------------------------------------------------------  Chemistries  Recent Labs  Lab 03/27/19 1506  03/30/19 0226 03/31/19 0216  NA 139   < >  --  140  K 3.7   < > 3.4* 3.1*  CL 107   < >  --  108  CO2 25   < >  --  24  GLUCOSE 87   < >  --  104*  BUN 53*   < >  --  19  CREATININE 0.64   < >  --  0.62  CALCIUM 9.5   < >  --  8.0*  MG  --    < >  2.0  --   AST 16  --   --   --   ALT 17  --   --   --   ALKPHOS 73  --   --   --   BILITOT 0.4  --   --   --    < > = values in this interval not displayed.   ------------------------------------------------------------------------------------------------------------------  Cardiac Enzymes No results for input(s): TROPONINI in the last 168 hours. ------------------------------------------------------------------------------------------------------------------  RADIOLOGY:  No results found.   ASSESSMENT AND PLAN:   73 year old female patient with history of CVA on Plavix,  hypertension, osteoporosis, chronic hip pain, chronic NSAID use presented to the emergency room for melena and anemia  * Acute gastrointestinal bleeding- suspected to be upper GI bleed given patient's melanotic stools. -Status post endoscopy showing AVMs which were cauterized by GI yesterday.  - Hg. Drifted down overnight and as per GI plan for capsule endoscopy today.  - cont. Protonix. - follow Hg.   * Symptomatic anemia secondary to acute GI bleed Transfused 2 units PRBC and Hg. Improved but has drifted down.  No acute need for transfusion presently.    -Iron studies consistent with iron deficiency.  Status post iron sucrose. - follow Hg.   * Atypical chest pain Troponin first set negative EKG normal sinus rhythm no ST segment changes - cont. To monitor.   * Hypokalemia - will replace orally and repeat in a.m  - Mg was normal yesterday.   * Anxiety - cont. Klonopin.     All the records are reviewed and case discussed with Care Management/Social Worker. Management plans discussed with the patient, family and they are in agreement.  CODE STATUS: Full code  DVT Prophylaxis: Ted's & SCD's.   TOTAL TIME TAKING CARE OF THIS PATIENT: 30 minutes.   POSSIBLE D/C IN 1-2 DAYS, DEPENDING ON CLINICAL CONDITION.   Henreitta Leber M.D on 03/31/2019 at 1:49 PM  Between 7am to 6pm - Pager - [phone]  After 6pm go to [email or web address] - [employer] [facility]  Office  [phone]  CC: Primary care physician; Leone Haven, MD

## 2019-04-01 ENCOUNTER — Inpatient Hospital Stay: Payer: Medicare Other

## 2019-04-01 DIAGNOSIS — D649 Anemia, unspecified: Secondary | ICD-10-CM

## 2019-04-01 DIAGNOSIS — Q2730 Arteriovenous malformation, site unspecified: Secondary | ICD-10-CM

## 2019-04-01 LAB — HEMOGLOBIN AND HEMATOCRIT, BLOOD
HCT: 29.7 % — ABNORMAL LOW (ref 36.0–46.0)
Hemoglobin: 9.7 g/dL — ABNORMAL LOW (ref 12.0–15.0)

## 2019-04-01 LAB — CBC
HCT: 24.1 % — ABNORMAL LOW (ref 36.0–46.0)
Hemoglobin: 7.7 g/dL — ABNORMAL LOW (ref 12.0–15.0)
MCH: 29.3 pg (ref 26.0–34.0)
MCHC: 32 g/dL (ref 30.0–36.0)
MCV: 91.6 fL (ref 80.0–100.0)
Platelets: 283 10*3/uL (ref 150–400)
RBC: 2.63 MIL/uL — ABNORMAL LOW (ref 3.87–5.11)
RDW: 15.1 % (ref 11.5–15.5)
WBC: 12 10*3/uL — ABNORMAL HIGH (ref 4.0–10.5)
nRBC: 0 % (ref 0.0–0.2)

## 2019-04-01 LAB — MAGNESIUM: Magnesium: 2.5 mg/dL — ABNORMAL HIGH (ref 1.7–2.4)

## 2019-04-01 LAB — PREPARE RBC (CROSSMATCH)

## 2019-04-01 LAB — POTASSIUM: Potassium: 3.2 mmol/L — ABNORMAL LOW (ref 3.5–5.1)

## 2019-04-01 MED ORDER — SODIUM CHLORIDE 0.9% IV SOLUTION: Freq: Once | INTRAVENOUS | Status: DC

## 2019-04-01 MED ORDER — LISINOPRIL 20 MG PO TABS
40.0000 mg | ORAL_TABLET | Freq: Every day | ORAL | Status: DC
  Administered 2019-04-01 – 2019-04-02 (×2): 40 mg via ORAL
  Filled 2019-04-01 (×2): qty 2

## 2019-04-01 MED ORDER — POTASSIUM CHLORIDE CRYS ER 20 MEQ PO TBCR
40.0000 meq | EXTENDED_RELEASE_TABLET | ORAL | Status: AC
  Administered 2019-04-01 (×2): 40 meq via ORAL
  Filled 2019-04-01 (×2): qty 2

## 2019-04-01 NOTE — Progress Notes (Signed)
Pt states would be agreable to rehab just to get her strength bag. It would also depend what facility it is. Would pass on to next shift

## 2019-04-01 NOTE — Plan of Care (Signed)

## 2019-04-01 NOTE — Progress Notes (Signed)
PT Cancellation Note  Patient Details Name: Toni Berger MRN: [id number] DOB: 11-23-1945   Cancelled Treatment:    Reason Eval/Treat Not Completed: Medical issues which prohibited therapy   Pt receiving blood transfusion on arrival.  Stated I would return later and she refused stating "I can't do it today."  Will reschedule as appropriate.   Chesley Noon 04/01/2019, 1:23 PM

## 2019-04-01 NOTE — Progress Notes (Addendum)
Shift summary:  - Patient state she is not ready to discharge to home today; would like to stay to "get stronger".  - Patient ambulates to bathroom and back, with a walker, completely unassisted.  - Transfused 1 unit PRBC today.  - Patient refused PT today, per PT note.  - Patient transporting off/back to unit for XRays.

## 2019-04-01 NOTE — Progress Notes (Signed)
Pharmacy Electrolyte Monitoring Consult:  Pharmacy consulted to assist in monitoring and replacing electrolytes in this 73 y.o. female admitted on 03/27/2019 with Melena and Weakness   Labs:  Sodium (mmol/L)  Date Value  03/31/2019 140   Potassium (mmol/L)  Date Value  04/01/2019 3.2 (L)   Magnesium (mg/dL)  Date Value  04/01/2019 2.5 (H)   Phosphorus (mg/dL)  Date Value  03/29/2019 2.9   Calcium (mg/dL)  Date Value  03/31/2019 8.0 (L)   Albumin (g/dL)  Date Value  03/27/2019 3.6    Assessment/Plan: Patient received potassium 23mEq PO x 1 on 7/17.   Will order potassium 106mEq PO Q4hr x 2 doses.   Will replace to maintain electrolytes within normal limits.   Pharmacy will continue to monitor and adjust per consult.   Lasharn Bufkin L 04/01/2019 10:09 AM

## 2019-04-01 NOTE — Progress Notes (Signed)
[facility] NAME: Toni Berger    MR#:  [id number]  DATE OF BIRTH:  October 01, 1945  SUBJECTIVE:   Patient's hemoglobin had drifted down this morning  Patient denies any worsening melena or bleeding.  She is hemodynamically stable. -some loose stools  REVIEW OF SYSTEMS:    Review of Systems  Constitutional: Negative for chills and fever.  HENT: Negative for congestion and tinnitus.   Eyes: Negative for blurred vision and double vision.  Respiratory: Negative for cough, shortness of breath and wheezing.   Cardiovascular: Negative for chest pain, orthopnea and PND.  Gastrointestinal: Positive for diarrhea. Negative for abdominal pain, nausea and vomiting.  Genitourinary: Negative for dysuria and hematuria.  Neurological: Positive for weakness (generalized. ). Negative for dizziness, sensory change and focal weakness.  All other systems reviewed and are negative.   Nutrition: full liquid diet Tolerating YM:EBRA noted--rehab  DRUG ALLERGIES:   Allergies  Allergen Reactions  . Carbidopa-Levodopa Other (See Comments)    REACTION: headache, stomach pain, chest pain and seeing purple colors  . Prednisone     Throat swelling   . Sulfonamide Derivatives     "stop breathing, eyes roll in back of head and fingers web"  . Ciprofloxacin   . Povidone-Iodine   . Propoxyphene Hcl   . Tetracycline   . Acetaminophen Diarrhea    Stomach pain  . Metoprolol Other (See Comments)    To low heart rate/ BP  . Pravastatin Other (See Comments)    Muscle cramps    VITALS:  Blood pressure 134/80, pulse 88, temperature (!) 97.3 F (36.3 C), temperature source Oral, resp. rate 20, height 5\' 7"  (1.702 m), weight 77.1 kg, SpO2 96 %.  PHYSICAL EXAMINATION:   Physical Exam  GENERAL:  73 y.o.-year-old patient sitting up in bed brushing her teeth in NAD.  EYES: Pupils equal, round, reactive to light and accommodation. No scleral icterus.  Extraocular muscles intact. Pale Conjunctiva. HEENT: Head atraumatic, normocephalic. Oropharynx and nasopharynx clear.  NECK:  Supple, no jugular venous distention. No thyroid enlargement, no tenderness.  LUNGS: Normal breath sounds bilaterally, no wheezing, rales, rhonchi. No use of accessory muscles of respiration.  CARDIOVASCULAR: S1, S2 normal. No murmurs, rubs, or gallops.  ABDOMEN: Soft, nontender, nondistended. Bowel sounds present. No organomegaly or mass.  EXTREMITIES: No cyanosis, clubbing or edema b/l.    NEUROLOGIC: Cranial nerves II through XII are intact. No focal Motor or sensory deficits b/l.   PSYCHIATRIC: The patient is alert and oriented x 3.  SKIN: No obvious rash, lesion, or ulcer.    LABORATORY PANEL:   CBC Recent Labs  Lab 04/01/19 0251  WBC 12.0*  HGB 7.7*  HCT 24.1*  PLT 283   ------------------------------------------------------------------------------------------------------------------  Chemistries  Recent Labs  Lab 03/27/19 1506  03/30/19 0226 03/31/19 0216 04/01/19 0251  NA 139   < >  --  140  --   K 3.7   < > 3.4* 3.1* 3.2*  CL 107   < >  --  108  --   CO2 25   < >  --  24  --   GLUCOSE 87   < >  --  104*  --   BUN 53*   < >  --  19  --   CREATININE 0.64   < >  --  0.62  --   CALCIUM 9.5   < >  --  8.0*  --  MG  --    < > 2.0  --   --   AST 16  --   --   --   --   ALT 17  --   --   --   --   ALKPHOS 73  --   --   --   --   BILITOT 0.4  --   --   --   --    < > = values in this interval not displayed.   ------------------------------------------------------------------------------------------------------------------  Cardiac Enzymes No results for input(s): TROPONINI in the last 168 hours. ------------------------------------------------------------------------------------------------------------------  RADIOLOGY:  No results found.   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  - Hg. Drifted down overnight to 7.7--will give 1 unit BT. Pt understands the risks and benefit of BT - capsule endoscopy results pending - cont. Protonix.  * Symptomatic anemia secondary to acute GI bleed Transfused 2 units PRBC and Hg. Improved but has drifted down.  No acute need for transfusion presently.    -Iron studies consistent with iron deficiency.  Status post iron sucrose.  * Atypical chest pain Troponin first set negative EKG normal sinus rhythm no ST segment changes   * Hypokalemia - will replace orally and repeat in a.m  - Mg was normal yesterday.   * Anxiety - cont. Klonopin.   *Generalized weakness -patient is worried about going home and living by herself given her G.I. bleed and significant weakness with requiring three units of blood transfusion. -Physical therapy recommendations appreciated. Will discuss social Industrial/product designer for discharge planning.   All the records are reviewed and case discussed with Care Management/Social Worker. Management plans discussed with the patient  CODE STATUS: Full code  DVT Prophylaxis: Ted's & SCD's.   TOTAL TIME TAKING CARE OF THIS PATIENT: 30 minutes.   POSSIBLE D/C IN 1-2 DAYS, DEPENDING ON CLINICAL CONDITION.   Fritzi Mandes M.D on 04/01/2019 at 8:23 AM  Between 7am to 6pm - Pager - [phone] After 6pm go to [email or web address] - Technical brewer [facility]  Office  [phone]  CC: Primary care physician; Leone Haven, MD

## 2019-04-01 NOTE — Progress Notes (Addendum)
Vonda Antigua, MD [address], [place], [place], [place], [address] [address], [place], [place], [place], [address] Phone: [phone]  Fax: [phone]   Subjective:  No signs of active GI bleeding.  No abdominal pain.  No nausea or vomiting.  Tolerating oral diet.  Patient reports of brown stool today  Objective: Exam: Vital signs in last 24 hours: Vitals:   04/01/19 1110 04/01/19 1121 04/01/19 1136 04/01/19 1319  BP:  136/76 140/71 (!) 151/79  Pulse: 72  71 80  Resp: 14  15 16   Temp: 98.7 F (37.1 C)  98.9 F (37.2 C) 99.1 F (37.3 C)  TempSrc: Oral  Oral Oral  SpO2: 97%  100% 98%  Weight:      Height:       Weight change:   Intake/Output Summary (Last 24 hours) at 04/01/2019 1549 Last data filed at 04/01/2019 1320 Gross per 24 hour  Intake 834 ml  Output -  Net 834 ml    General: No acute distress, AAO x3 Abd: Soft, NT/ND, No HSM Skin: Warm, no rashes Neck: Supple, Trachea midline   Lab Results: Lab Results  Component Value Date   WBC 12.0 (H) 04/01/2019   HGB 7.7 (L) 04/01/2019   HCT 24.1 (L) 04/01/2019   MCV 91.6 04/01/2019   PLT 283 04/01/2019   Micro Results: Recent Results (from the past 240 hour(s))  SARS Coronavirus 2 (CEPHEID - Performed in [place] hospital lab), Hosp Order     Status: None   Collection Time: 03/27/19  8:03 PM   Specimen: Nasopharyngeal Swab  Result Value Ref Range Status   SARS Coronavirus 2 NEGATIVE NEGATIVE Final    Comment: (NOTE) If result is NEGATIVE SARS-CoV-2 target nucleic acids are NOT DETECTED. The SARS-CoV-2 RNA is generally detectable in upper and lower  respiratory specimens during the acute phase of infection. The lowest  concentration of SARS-CoV-2 viral copies this assay can detect is 250  copies / mL. A negative result does not preclude SARS-CoV-2 infection  and should not be used as the sole basis for treatment or other  patient management decisions.  A negative result may occur with   improper specimen collection / handling, submission of specimen other  than nasopharyngeal swab, presence of viral mutation(s) within the  areas targeted by this assay, and inadequate number of viral copies  (<250 copies / mL). A negative result must be combined with clinical  observations, patient history, and epidemiological information. If result is POSITIVE SARS-CoV-2 target nucleic acids are DETECTED. The SARS-CoV-2 RNA is generally detectable in upper and lower  respiratory specimens dur ing the acute phase of infection.  Positive  results are indicative of active infection with SARS-CoV-2.  Clinical  correlation with patient history and other diagnostic information is  necessary to determine patient infection status.  Positive results do  not rule out bacterial infection or co-infection with other viruses. If result is PRESUMPTIVE POSTIVE SARS-CoV-2 nucleic acids MAY BE PRESENT.   A presumptive positive result was obtained on the submitted specimen  and confirmed on repeat testing.  While 2019 novel coronavirus  (SARS-CoV-2) nucleic acids may be present in the submitted sample  additional confirmatory testing may be necessary for epidemiological  and / or clinical management purposes  to differentiate between  SARS-CoV-2 and other Sarbecovirus currently known to infect humans.  If clinically indicated additional testing with an alternate test  methodology [phone]) is advised. The SARS-CoV-2 RNA is generally  detectable in upper  and lower respiratory sp ecimens during the acute  phase of infection. The expected result is Negative. Fact Sheet for Patients:  StrictlyIdeas.no Fact Sheet for Healthcare Providers: BankingDealers.co.za This test is not yet approved or cleared by the [place] FDA and has been authorized for detection and/or diagnosis of SARS-CoV-2 by FDA under an Emergency Use Authorization (EUA).  This EUA will  remain in effect (meaning this test can be used) for the duration of the COVID-19 declaration under Section 564(b)(1) of the Act, 21 U.S.C. section 360bbb-3(b)(1), unless the authorization is terminated or revoked sooner. Performed at [facility], [address] [place], [place] [address]    Studies/Results: Dg Abd 2 Views  Result Date: 04/01/2019 CLINICAL DATA:  Evaluate capsule endoscopy EXAM: ABDOMEN - 2 VIEW COMPARISON:  03/27/2019 FINDINGS: The capsule projects in the right lower quadrant just to the right of midline possibly transverse colon or small bowel. Small and large bowel are nondilated. Fluid levels are noted on the erect film. No free air. Cholecystectomy clips. Spondylitic changes in the lumbar spine. IMPRESSION: 1. Endoscopy capsule projects in the right lower quadrant as above. 2. Nonobstructive bowel gas pattern. Electronically Signed   By: Lucrezia Europe M.D.   On: 04/01/2019 14:00   Medications:  Scheduled Meds: . sodium chloride   Intravenous Once  . cholecalciferol  2,000 Units Oral Daily  . feeding supplement  1 Container Oral TID BM  . lisinopril  40 mg Oral Daily  . pantoprazole  40 mg Intravenous Q12H  . vitamin B-12  1,000 mcg Oral Daily   Continuous Infusions: PRN Meds:.acetaminophen **OR** acetaminophen, clonazePAM, ondansetron **OR** ondansetron (ZOFRAN) IV, traMADol   Assessment: Anemia  Plan: Patient presented with anemia and was evaluated by Dr. Marius Ditch and underwent an upper endoscopy that showed bleeding AVMs that were treated (see report) and then a small bowel capsule study due to ongoing anemia ordered by Dr. Marius Ditch  Patient has not had any further active GI bleeding  Small bowel capsule report sent for scanning and did not show any new lesions, or active bleeding  However, small bowel capsule did not reach the cecum at the end of the recording.  Abdominal x-ray today reported capsule to be in the right lower quadrant and radiologist  states it could be in the colon or small bowel.  Given that patient has not had any further active GI bleeding, her anemia and symptoms on presentation were likely due to the bleeding AVMs seen and treated by Dr. Marius Ditch on upper endoscopy  Would recommend serial CBCs and transfuse PRN  Continue PPI for 30 days  However, if patient develops active GI bleeding or worsening anemia, patient may need colonoscopy as an inpatient, with possible repeat upper endoscopy with capsule placement.  Otherwise, patient should follow-up in clinic with Dr. Marius Ditch within 1 to 2 weeks of discharge  Repeat abdominal x-ray in 1 to 2 days to ensure capsule has passed   Small bowel capsule report:  Procedure information and findings: Landmarks: First Duodenal Image 05:54:37  The capsule did not reach the cecum by the end of the recording.  It spent an extended time in the stomach.  Abdominal X-ray obtained today shows Capsule to be in the right lower quadrant with radiologist reading it to be in the colon or small bowel.   Gastric erythema noted and this area was examined during the EGD.   Black material in the stomach is likely from the cauterization performed during the patient's upper  endoscopy.   No sources of anemia or active GI bleeding noted.   Summary and recommendations:  Patient's anemia is likely from the bleeding AVMs noted during her Upper Endoscopy by Dr. Marius Ditch. These were treated with cauterization.   The small bowel capsule study did not evaluate the entire small bowel, but no sources of  active bleeding were noted until the extent of the exam.   Pt has not had any further melena or hematochezia, and no signs of active GI bleeding since her procedure. If no further active GI bleeding occurs and hemoglobin remains stable, pt should follow up with Dr. Marius Ditch within 1-2 weeks of discharge to discuss outpatient colonoscopy and/or repeat capsule endoscopy.   However, If pt continues develops  worsening anemia or active GI bleeding, she would need a colonoscopy as an inpatient with a possible repeat upper endoscopy for re-evaluation of treated bleeding AVMs and possible placement of small bowel capsule endoscopically.  Continue Serial CBCs and transfuse PRN  Avoid NSAIDs  Continue PPI twice daily for 30 days  LOS: 5 days   Vonda Antigua, MD 04/01/2019, 3:49 PM

## 2019-04-02 ENCOUNTER — Other Ambulatory Visit: Payer: Self-pay | Admitting: Gastroenterology

## 2019-04-02 ENCOUNTER — Encounter: Payer: Self-pay | Admitting: Family Medicine

## 2019-04-02 DIAGNOSIS — T189XXD Foreign body of alimentary tract, part unspecified, subsequent encounter: Secondary | ICD-10-CM

## 2019-04-02 LAB — PHOSPHORUS: Phosphorus: 2.3 mg/dL — ABNORMAL LOW (ref 2.5–4.6)

## 2019-04-02 LAB — BASIC METABOLIC PANEL
Anion gap: 6 (ref 5–15)
BUN: 24 mg/dL — ABNORMAL HIGH (ref 8–23)
CO2: 25 mmol/L (ref 22–32)
Calcium: 8.2 mg/dL — ABNORMAL LOW (ref 8.9–10.3)
Chloride: 108 mmol/L (ref 98–111)
Creatinine, Ser: 0.85 mg/dL (ref 0.44–1.00)
GFR calc Af Amer: >60 mL/min (ref >60)
GFR calc non Af Amer: >60 mL/min (ref >60)
Glucose, Bld: 100 mg/dL — ABNORMAL HIGH (ref 70–99)
Potassium: 4.6 mmol/L (ref 3.5–5.1)
Sodium: 139 mmol/L (ref 135–145)

## 2019-04-02 LAB — TYPE AND SCREEN
ABO/RH(D): B POS
Antibody Screen: NEGATIVE
Unit division: 0

## 2019-04-02 LAB — BPAM RBC
Blood Product Expiration Date: 202008132359
ISSUE DATE / TIME: 202007181105
Unit Type and Rh: 7300

## 2019-04-02 LAB — HEMOGLOBIN: Hemoglobin: 9.3 g/dL — ABNORMAL LOW (ref 12.0–15.0)

## 2019-04-02 LAB — MAGNESIUM: Magnesium: 2.4 mg/dL (ref 1.7–2.4)

## 2019-04-02 MED ORDER — TRAMADOL HCL 50 MG PO TABS: 50.0000 mg | ORAL_TABLET | Freq: Two times a day (BID) | ORAL | 0 refills | Status: DC | PRN

## 2019-04-02 MED ORDER — PANTOPRAZOLE SODIUM 40 MG PO TBEC: 40.0000 mg | DELAYED_RELEASE_TABLET | Freq: Two times a day (BID) | ORAL | 1 refills | Status: DC

## 2019-04-02 MED ORDER — AMLODIPINE BESYLATE 5 MG PO TABS
5.0000 mg | ORAL_TABLET | Freq: Every day | ORAL | Status: DC
  Filled 2019-04-02: qty 1

## 2019-04-02 MED ORDER — PANTOPRAZOLE SODIUM 40 MG PO TBEC: 40.0000 mg | DELAYED_RELEASE_TABLET | Freq: Two times a day (BID) | ORAL | Status: DC

## 2019-04-02 NOTE — Discharge Instructions (Signed)
Please follow up with Dr Marius Ditch  In 7-10 days. Your PLAVIX is on hold for now--discuss with Dr Marius Ditch if ok to resume it when you see her at f/u

## 2019-04-02 NOTE — Discharge Summary (Signed)
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 Allergy   . Anxiety   . Arthritis   . Fibrocystic breast   . Hyperlipidemia   . Hypertension   . Lipoma    5/01 Abdominal wall lipoma  . Loss of consciousness ([facility]) 12/15/2015  . Migraines   . Osteoporosis, unspecified   . Restless leg syndrome   . 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 [place] overnight to 7.7--will give 1 unit BT--9.7--9.3 - capsule endoscopy no active source of bleeding noted per report - cont. Protonix. -Holding plavix for now till further f/u with GI as out pt -avoid NSAIDS--pt aware  * Symptomatic anemia secondary to acute GI bleed Transfused 3 units PRBC and Hg. Improved but has drifted down   No acute need for transfusion presently.    -Iron studies consistent with iron deficiency.  Status post iron sucrose. -hgb 9.3  * Atypical chest pain Troponin first set negative EKG normal sinus rhythm no ST segment changes   * Hypokalemia-repleted - Mg was normal    * Anxiety - cont. Klonopin.   *Generalized weakness -patient is worried about going home and living by herself given her G.I. bleed and significant weakness with  requiring three units of blood transfusion. -Physical therapy walked patient w/o any difficulty. No PT needs at home  Management plans discussed with the patient OK from GI standpoint to go home. D/c home today  CODE STATUS: Full code  CONSULTS OBTAINED:  Treatment Team:  Lin Landsman, MD Virgel Manifold, MD  DRUG ALLERGIES:   Allergies  Allergen Reactions  . Carbidopa-Levodopa Other (See Comments)    REACTION: headache, stomach pain, chest pain and seeing purple colors  . Prednisone     Throat swelling   . Sulfonamide Derivatives     "stop breathing, eyes roll in back of head and fingers web"  . Ciprofloxacin   . Povidone-Iodine   . Propoxyphene Hcl   . Tetracycline   . Acetaminophen Diarrhea    Stomach pain  . Metoprolol Other (See Comments)    To low heart rate/ BP  . Pravastatin Other (See Comments)    Muscle cramps    DISCHARGE MEDICATIONS:   Allergies as of 04/02/2019      Reactions   Carbidopa-levodopa Other (See Comments)   REACTION: headache, stomach pain, chest pain and seeing purple colors   Prednisone    Throat swelling   Sulfonamide Derivatives    "stop breathing, eyes roll in back of head and fingers web"   Ciprofloxacin    Povidone-iodine    Propoxyphene Hcl    Tetracycline    Acetaminophen Diarrhea   Stomach pain   Metoprolol Other (See  Comments)   To low heart rate/ BP   Pravastatin Other (See Comments)   Muscle cramps      Medication List    STOP taking these medications   Advil 200 MG tablet Generic drug: ibuprofen   clopidogrel 75 MG tablet Commonly known as: PLAVIX     TAKE these medications   amLODipine 5 MG tablet Commonly known as: NORVASC TAKE 1 TABLET DAILY   B-12 1000 MCG Tabs Take 1 tablet by mouth daily.   clonazePAM 1 MG tablet Commonly known as: KLONOPIN TAKE 1 TABLET BY MOUTH EVERY 8 TO 12 HOURS AS NEEDED FOR ANXIETY (MAX 3 DOSES IN 24 HOURS) What changed: See the new instructions.    lisinopril 40 MG tablet Commonly known as: ZESTRIL TAKE 1 TABLET DAILY   pantoprazole 40 MG tablet Commonly known as: PROTONIX Take 1 tablet (40 mg total) by mouth 2 (two) times daily before a meal.   traMADol 50 MG tablet Commonly known as: Ultram Take 1 tablet (50 mg total) by mouth every 12 (twelve) hours as needed. What changed: when to take this   Vitamin D3 50 MCG (2000 UT) capsule Take 1 capsule (2,000 Units total) by mouth daily.       If you experience worsening of your admission symptoms, develop shortness of breath, life threatening emergency, suicidal or homicidal thoughts you must seek medical attention immediately by calling 911 or calling your MD immediately  if symptoms less severe.  You Must read complete instructions/literature along with all the possible adverse reactions/side effects for all the Medicines you take and that have been prescribed to you. Take any new Medicines after you have completely understood and accept all the possible adverse reactions/side effects.   Please note  You were cared for by a hospitalist during your hospital stay. If you have any questions about your discharge medications or the care you received while you were in the hospital after you are discharged, you can call the unit and asked to speak with the hospitalist on call if the hospitalist that took care of you is not available. Once you are discharged, your primary care physician will handle any further medical issues. Please note that NO REFILLS for any discharge medications will be authorized once you are discharged, as it is imperative that you return to your primary care physician (or establish a relationship with a primary care physician if you do not have one) for your aftercare needs so that they can reassess your need for medications and monitor your lab values. Today   SUBJECTIVE   No new complaints  VITAL SIGNS:  Blood pressure (!) 164/91, pulse 86, temperature 98.9 F  (37.2 C), temperature source Oral, resp. rate 18, height 5\' 7"  (1.702 m), weight 77.1 kg, SpO2 95 %.  I/O:    Intake/Output Summary (Last 24 hours) at 04/02/2019 0946 Last data filed at 04/01/2019 1845 Gross per 24 hour  Intake 594 ml  Output -  Net 594 ml    PHYSICAL EXAMINATION:  GENERAL:  73 y.o.-year-old patient lying in the bed with no acute distress.  EYES: Pupils equal, round, reactive to light and accommodation. No scleral icterus. Extraocular muscles intact.  HEENT: Head atraumatic, normocephalic. Oropharynx and nasopharynx clear.  NECK:  Supple, no jugular venous distention. No thyroid enlargement, no tenderness.  LUNGS: Normal breath sounds bilaterally, no wheezing, rales,rhonchi or crepitation. No use of accessory muscles of respiration.  CARDIOVASCULAR: S1, S2 normal. No murmurs, rubs, or gallops.  ABDOMEN: Soft,  non-tender, non-distended. Bowel sounds present. No organomegaly or mass.  EXTREMITIES: No pedal edema, cyanosis, or clubbing.  NEUROLOGIC: Cranial nerves II through XII are intact. Muscle strength 5/5 in all extremities. Sensation intact. Gait not checked.  PSYCHIATRIC: The patient is alert and oriented x 3.  SKIN: No obvious rash, lesion, or ulcer.   DATA REVIEW:   CBC  Recent Labs  Lab 04/01/19 0251 04/01/19 1542 04/02/19 0437  WBC 12.0*  --   --   HGB 7.7* 9.7* 9.3*  HCT 24.1* 29.7*  --   PLT 283  --   --     Chemistries  Recent Labs  Lab 03/27/19 1506  04/02/19 0437  NA 139   < > 139  K 3.7   < > 4.6  CL 107   < > 108  CO2 25   < > 25  GLUCOSE 87   < > 100*  BUN 53*   < > 24*  CREATININE 0.64   < > 0.85  CALCIUM 9.5   < > 8.2*  MG  --    < > 2.4  AST 16  --   --   ALT 17  --   --   ALKPHOS 73  --   --   BILITOT 0.4  --   --    < > = values in this interval not displayed.    Microbiology Results   Recent Results (from the past 240 hour(s))  SARS Coronavirus 2 (CEPHEID - Performed in [facility] hospital lab), Hosp Order      Status: None   Collection Time: 03/27/19  8:03 PM   Specimen: Nasopharyngeal Swab  Result Value Ref Range Status   SARS Coronavirus 2 NEGATIVE NEGATIVE Final    Comment: (NOTE) If result is NEGATIVE SARS-CoV-2 target nucleic acids are NOT DETECTED. The SARS-CoV-2 RNA is generally detectable in upper and lower  respiratory specimens during the acute phase of infection. The lowest  concentration of SARS-CoV-2 viral copies this assay can detect is 250  copies / mL. A negative result does not preclude SARS-CoV-2 infection  and should not be used as the sole basis for treatment or other  patient management decisions.  A negative result may occur with  improper specimen collection / handling, submission of specimen other  than nasopharyngeal swab, presence of viral mutation(s) within the  areas targeted by this assay, and inadequate number of viral copies  (<250 copies / mL). A negative result must be combined with clinical  observations, patient history, and epidemiological information. If result is POSITIVE SARS-CoV-2 target nucleic acids are DETECTED. The SARS-CoV-2 RNA is generally detectable in upper and lower  respiratory specimens dur ing the acute phase of infection.  Positive  results are indicative of active infection with SARS-CoV-2.  Clinical  correlation with patient history and other diagnostic information is  necessary to determine patient infection status.  Positive results do  not rule out bacterial infection or co-infection with other viruses. If result is PRESUMPTIVE POSTIVE SARS-CoV-2 nucleic acids MAY BE PRESENT.   A presumptive positive result was obtained on the submitted specimen  and confirmed on repeat testing.  While 2019 novel coronavirus  (SARS-CoV-2) nucleic acids may be present in the submitted sample  additional confirmatory testing may be necessary for epidemiological  and / or clinical management purposes  to differentiate between  SARS-CoV-2 and other  Sarbecovirus currently known to infect humans.  If clinically indicated additional testing with an alternate test  methodology [phone]) is advised. The SARS-CoV-2 RNA is generally  detectable in upper and lower respiratory sp ecimens during the acute  phase of infection. The expected result is Negative. Fact Sheet for Patients:  StrictlyIdeas.no Fact Sheet for Healthcare Providers: BankingDealers.co.za This test is not yet approved or cleared by the [place] FDA and has been authorized for detection and/or diagnosis of SARS-CoV-2 by FDA under an Emergency Use Authorization (EUA).  This EUA will remain in effect (meaning this test can be used) for the duration of the COVID-19 declaration under Section 564(b)(1) of the Act, 21 U.S.C. section 360bbb-3(b)(1), unless the authorization is terminated or revoked sooner. Performed at [facility], [address] [place], [place] [address]     RADIOLOGY:  Dg Abd 2 Views  Result Date: 04/01/2019 CLINICAL DATA:  Evaluate capsule endoscopy EXAM: ABDOMEN - 2 VIEW COMPARISON:  03/27/2019 FINDINGS: The capsule projects in the right lower quadrant just to the right of midline possibly transverse colon or small bowel. Small and large bowel are nondilated. Fluid levels are noted on the erect film. No free air. Cholecystectomy clips. Spondylitic changes in the lumbar spine. IMPRESSION: 1. Endoscopy capsule projects in the right lower quadrant as above. 2. Nonobstructive bowel gas pattern. Electronically Signed   By: Lucrezia Europe M.D.   On: 04/01/2019 14:00     CODE STATUS:     Code Status Orders  (From admission, onward)         Start     Ordered   03/27/19 2023  Do not attempt resuscitation (DNR)  Continuous    Question Answer Comment  In the event of cardiac or respiratory ARREST Do not call a "code blue"   In the event of cardiac or respiratory ARREST Do not perform Intubation, CPR,  defibrillation or ACLS   In the event of cardiac or respiratory ARREST Use medication by any route, position, wound care, and other measures to relive pain and suffering. May use oxygen, suction and manual treatment of airway obstruction as needed for comfort.      03/27/19 2022        Code Status History    This patient has a current code status but no historical code status.   Advance Care Planning Activity    Advance Directive Documentation     Most Recent Value  Type of Advance Directive  Healthcare Power of Attorney  Pre-existing out of facility DNR order (yellow form or pink MOST form)  Yellow form placed in chart (order not valid for inpatient use)  "MOST" Form in Place?  -      TOTAL TIME TAKING CARE OF THIS PATIENT: *40* minutes.    Fritzi Mandes M.D on 04/02/2019 at 9:46 AM  Between 7am to 6pm - Pager - [phone] After 6pm go to [email or web address] - password [email or web address] [facility]  Office  [phone]  CC: Primary care physician; Leone Haven, MD

## 2019-04-02 NOTE — Progress Notes (Signed)
Pharmacy Electrolyte Monitoring Consult:  Pharmacy consulted to assist in monitoring and replacing electrolytes in this 73 y.o. female admitted on 03/27/2019 with Melena and Weakness   Labs:  Sodium (mmol/L)  Date Value  04/02/2019 139   Potassium (mmol/L)  Date Value  04/02/2019 4.6   Magnesium (mg/dL)  Date Value  04/02/2019 2.4   Phosphorus (mg/dL)  Date Value  04/02/2019 2.3 (L)   Calcium (mg/dL)  Date Value  04/02/2019 8.2 (L)   Albumin (g/dL)  Date Value  03/27/2019 3.6    Assessment/Plan: Potassium has corrected. Phosphorus is minimally low, will continue to monitor. No further replacement warranted.   Will obtain BMP/Phos with am labs.   Will replace to maintain electrolytes within normal limits.   Pharmacy will continue to monitor and adjust per consult.   Simpson,Michael L 04/02/2019 7:49 AM

## 2019-04-02 NOTE — Progress Notes (Signed)
Physical Therapy Treatment Patient Details Name: Toni Berger MRN: [id number] DOB: 25-Jul-1946 Today's Date: 04/02/2019    History of Present Illness Per MD H&P 03/27/19: Pt is a 73 y.o. female with a known history of hypertension, hyperlipidemia, restless leg syndrome, osteoarthritis of the knee who states that she has been having dark-colored stools since yesterday.  She has had multiple episodes of this.  Did not have any abdominal pain.  Patient does take 2 Advil per day.  She states that she did feel dizzy and weak today.  Denies any hematemesis denies any chest pain or shortness of breath.  Assessment includes: Acute gastrointestinal bleeding, Symptomatic anemia secondary to acute GI bleed, Atypical chest pain, and thigh pain.  Chart review also reveals a recent diagnosis of Trochanteric bursitis of left hip as well as anxiety and depression.    PT Comments    Request for session by MD this am for discharge planning.    Pt standing at sink upon arrival grooming with good balance and safety.  Pt very defensive upon entering room asking I was who came to see her yesterday.  Stated I was. Pt very upset stating she did not refuse therapy yesterday.  I reviewed interaction with pt and her stating "I can't do it today." and that she was currently  receiving blood at the time so when she refused and I saw the blood transfusion I did not press her to participate.  She was resistive to listening to explanation. She continued to disagree and argue stating I was lying and I stated that we would have to agree to disagree on the interaction and would need to move forward with session today.  She remained defensive and argumentative throughout session but did ambulate around nursing unit x 1 with no device.  She refused to use RW pushing it away "I don't want that."  She had several small imbalances but was able to recover without assist and stated she would not use one at home.  She may benefit from it's use  in the community for stability until she regains her strength and endurance.  Pt continued to be upset and complaining over her self selected gait speed stating she was walking too fast.  Encouraged her to slow down as I was just keeping up with her pace and not pushing her faster.  Attempted to discuss discharge planning and safety but it was not an opportune learning time based on her frustration and agitation level.  She returned to her room. Discussed with MD mobility status.   Will adjust discharge recommendations and her overall strength and mobility have improved significantly since admission and HHPT is appropriate at this time.   Follow Up Recommendations  Home health PT;Supervision - Intermittent     Equipment Recommendations  Rolling walker with 5" wheels    Recommendations for Other Services       Precautions / Restrictions Precautions Precautions: Fall Restrictions Weight Bearing Restrictions: No    Mobility  Bed Mobility Overal bed mobility: Modified Independent                Transfers Overall transfer level: Modified independent   Transfers: Sit to/from Stand Sit to Stand: Modified independent (Device/Increase time)            Ambulation/Gait Ambulation/Gait assistance: Supervision Gait Distance (Feet): 160 Feet Assistive device: None Gait Pattern/deviations: Step-through pattern;Staggering left;Staggering right   Gait velocity interpretation: 1.31 - 2.62 ft/sec, indicative of limited community ambulator  Stairs             Wheelchair Mobility    Modified Rankin (Stroke Patients Only)       Balance Overall balance assessment: Modified Independent                                          Cognition Arousal/Alertness: Awake/alert Behavior During Therapy: Agitated Overall Cognitive Status: Within Functional Limits for tasks assessed                                        Exercises       General Comments        Pertinent Vitals/Pain Pain Assessment: No/denies pain    Home Living                      Prior Function            PT Goals (current goals can now be found in the care plan section) Progress towards PT goals: Progressing toward goals    Frequency           PT Plan Discharge plan needs to be updated    Co-evaluation              AM-PAC PT "6 Clicks" Mobility   Outcome Measure  Help needed turning from your back to your side while in a flat bed without using bedrails?: None Help needed moving from lying on your back to sitting on the side of a flat bed without using bedrails?: None Help needed moving to and from a bed to a chair (including a wheelchair)?: None Help needed standing up from a chair using your arms (e.g., wheelchair or bedside chair)?: None Help needed to walk in hospital room?: None Help needed climbing 3-5 steps with a railing? : A Little 6 Click Score: 23    End of Session Equipment Utilized During Treatment: Gait belt   Patient left: in bed;with call bell/phone within reach         Time: 0852-0900 PT Time Calculation (min) (ACUTE ONLY): 8 min  Charges:  $Gait Training: 8-22 mins                     Chesley Noon, PTA 04/02/19, 9:15 AM

## 2019-04-02 NOTE — Progress Notes (Signed)
Toni Berger to be D/C'd Home per MD order.  Discussed prescriptions and follow up appointments with the patient. Prescriptions given to patient, medication list explained in detail. Pt verbalized understanding.  Allergies as of 04/02/2019      Reactions   Carbidopa-levodopa Other (See Comments)   REACTION: headache, stomach pain, chest pain and seeing purple colors   Prednisone    Throat swelling   Sulfonamide Derivatives    "stop breathing, eyes roll in back of head and fingers web"   Ciprofloxacin    Povidone-iodine    Propoxyphene Hcl    Tetracycline    Acetaminophen Diarrhea   Stomach pain   Metoprolol Other (See Comments)   To low heart rate/ BP   Pravastatin Other (See Comments)   Muscle cramps      Medication List    STOP taking these medications   Advil 200 MG tablet Generic drug: ibuprofen   clopidogrel 75 MG tablet Commonly known as: PLAVIX     TAKE these medications   amLODipine 5 MG tablet Commonly known as: NORVASC TAKE 1 TABLET DAILY   B-12 1000 MCG Tabs Take 1 tablet by mouth daily.   clonazePAM 1 MG tablet Commonly known as: KLONOPIN TAKE 1 TABLET BY MOUTH EVERY 8 TO 12 HOURS AS NEEDED FOR ANXIETY (MAX 3 DOSES IN 24 HOURS) What changed: See the new instructions.   lisinopril 40 MG tablet Commonly known as: ZESTRIL TAKE 1 TABLET DAILY   pantoprazole 40 MG tablet Commonly known as: PROTONIX Take 1 tablet (40 mg total) by mouth 2 (two) times daily before a meal.   traMADol 50 MG tablet Commonly known as: Ultram Take 1 tablet (50 mg total) by mouth every 12 (twelve) hours as needed. What changed: when to take this   Vitamin D3 50 MCG (2000 UT) capsule Take 1 capsule (2,000 Units total) by mouth daily.       Vitals:   04/02/19 0404 04/02/19 0956  BP: (!) 164/91 (!) 159/78  Pulse: 86 99  Resp: 18   Temp: 98.9 F (37.2 C)   SpO2: 95% 97%    Skin clean, dry and intact without evidence of skin break down, no evidence of skin tears  noted. IV catheter discontinued intact. Site without signs and symptoms of complications. Dressing and pressure applied. Pt denies pain at this time. No complaints noted.  An After Visit Summary was printed and given to the patient. Patient escorted via [facility], and D/C home via private auto.  Fuller Mandril, RN

## 2019-04-03 ENCOUNTER — Telehealth: Payer: Self-pay

## 2019-04-03 ENCOUNTER — Telehealth: Payer: Self-pay | Admitting: Gastroenterology

## 2019-04-03 ENCOUNTER — Encounter: Payer: Self-pay | Admitting: Gastroenterology

## 2019-04-03 ENCOUNTER — Telehealth: Payer: Self-pay | Admitting: Family Medicine

## 2019-04-03 NOTE — Telephone Encounter (Signed)
Appt with Dr. Marius Ditch for 1 wk hospital f/u. ok to overbook. f/u melena   Per Dr. Bonna Gains through [facility].

## 2019-04-03 NOTE — Telephone Encounter (Signed)
Transition Care Management Follow-up Telephone Call  How have you been since you were released from the hospital? Patient says she has felt ok , she feels likes she was treated badly in the hospital. Toni Berger she received blood transfusion and did not believe she needed it. Patient says she gets really SOB with exertion.  Patient wanted to go to rehab and she was refused. Patient says she will never go back to the hospital.   Do you understand why you were in the hospital? yes   Do you understand the discharge instrcutions? yes  Items Reviewed:  Medications reviewed: yes  Allergies reviewed: yes  Dietary changes reviewed: yes  Referrals reviewed: yes   Functional Questionnaire:   Activities of Daily Living (ADLs):   She states they are independent in the following: bathing and hygiene, feeding, continence, grooming, toileting and dressing States they require assistance with the following: ambulation only with walker.   Any transportation issues/concerns?: no   Any patient concerns? Yes, patient needs script for tramadol and patient would like provider to look at  Records and see if she was having anything heart related.   Confirmed importance and date/time of follow-up visits scheduled: YES   Confirmed with patient if condition begins to worsen call PCP or go to the ER.  Patient was given the Call-a-Nurse line [phone]: Yes.

## 2019-04-03 NOTE — Telephone Encounter (Signed)
Called pt to schedule 1 week f/u apt from [facility] per Dr. Mcneil Sober note pt declined and wants to call me back

## 2019-04-03 NOTE — Telephone Encounter (Signed)
Noted. Will discuss during visit tomorrow.

## 2019-04-03 NOTE — Telephone Encounter (Signed)
I left message for pt to have abdominal x-ray in the next 1-2 days to make sure she has passed the small bowel capsule and the office will be contacting her to make an appt with Dr. Marius Ditch.

## 2019-04-03 NOTE — Telephone Encounter (Signed)
-----   Message from Virgel Manifold, MD sent at 04/02/2019  3:42 PM EDT ----- Please call this pt and let her know we have ordered an abdominal x-ray to make sure the small bowel capsule has passed.  She should get this done in the next 1 to 2 days.  I have also asked Tati to call her to make an appointment with Dr. Marius Ditch

## 2019-04-03 NOTE — Telephone Encounter (Signed)
-----   Message from Virgel Manifold, MD sent at 04/02/2019  9:57 AM EDT -----  Please set up clinic appointment with Dr. Marius Ditch within this week/ 1 week for hospital follow up. Ok to [place]

## 2019-04-04 ENCOUNTER — Encounter: Payer: Self-pay | Admitting: Family Medicine

## 2019-04-04 ENCOUNTER — Ambulatory Visit (INDEPENDENT_AMBULATORY_CARE_PROVIDER_SITE_OTHER): Payer: Medicare Other | Admitting: Family Medicine

## 2019-04-04 ENCOUNTER — Other Ambulatory Visit: Payer: Self-pay

## 2019-04-04 VITALS — Ht 68.0 in | Wt 170.0 lb

## 2019-04-04 DIAGNOSIS — K922 Gastrointestinal hemorrhage, unspecified: Secondary | ICD-10-CM | POA: Diagnosis not present

## 2019-04-04 DIAGNOSIS — R531 Weakness: Secondary | ICD-10-CM

## 2019-04-04 DIAGNOSIS — R0602 Shortness of breath: Secondary | ICD-10-CM

## 2019-04-04 DIAGNOSIS — K921 Melena: Secondary | ICD-10-CM | POA: Diagnosis not present

## 2019-04-04 NOTE — Progress Notes (Signed)
Virtual Visit via telephone Note  This visit type was conducted due to national recommendations for restrictions regarding the COVID-19 pandemic (e.g. social distancing).  This format is felt to be most appropriate for this patient at this time.  All issues noted in this document were discussed and addressed.  No physical exam was performed (except for noted visual exam findings with Video Visits).   I connected with Toni Berger today at 10:00 AM EDT by telephone and verified that I am speaking with the correct person using two identifiers. Location patient: home Location provider: work Persons participating in the virtual visit: patient, provider  I discussed the limitations, risks, security and privacy concerns of performing an evaluation and management service by telephone and the availability of in person appointments. I also discussed with the patient that there may be a patient responsible charge related to this service. The patient expressed understanding and agreed to proceed.  Interactive audio and video telecommunications were attempted between this provider and patient, however failed, due to patient having technical difficulties OR patient did not have access to video capability.  We continued and completed visit with audio only.  Reason for visit: follow-up hospitalization  HPI: GI bleed: Patient was hospitalized for a GI bleed.  She underwent EGD which revealed an AVM.  Her anemia had trended down to 6.4 and she was transfused 3 units.  She was having dyspnea on exertion and she does still note some dyspnea with exercise.  She had melena prior to going to the hospital and had decreased energy and strength and felt weak.  She is unable to tell me whether or not her dyspnea is worse than when she left the hospital.  She has not had any abdominal pain.  She notes her stools have been mildly black and there has been minimal bright red blood per rectum just from rawness of her rectum  since discharge.  She reports a bad experience with a capsule endoscopy and had significant nausea and vomiting and black diarrhea after taking this.  She notes they gave her another unit of blood after that and she was overall dissatisfied with that experience.  She reports she wanted to go to rehab though based on PT evaluation she did not meet criteria.  She notes she was advised that she needed a follow-up x-ray as the capsule was not expelled though she is refusing to complete this or see the same GI physician she saw in the hospital due to this experience.  She notes due to this experience she is not sure if she would even go back to the hospital if she was found to have a lower hemoglobin on recheck.  She denies any SI.   ROS: See pertinent positives and negatives per HPI.  Past Medical History:  Diagnosis Date  . Allergy   . Anxiety   . Arthritis   . Fibrocystic breast   . Hyperlipidemia   . Hypertension   . Lipoma    5/01 Abdominal wall lipoma  . Loss of consciousness ([place]) 12/15/2015  . Migraines   . Osteoporosis, unspecified   . Restless leg syndrome   . Stroke (cerebrum) [facility])     Past Surgical History:  Procedure Laterality Date  . APPENDECTOMY  1999  . BREAST CYST ASPIRATION    . CHOLECYSTECTOMY  1999  . DOBUTAMINE STRESS ECHO  06/96   negative  . ESOPHAGOGASTRODUODENOSCOPY N/A 03/30/2019   Procedure: ESOPHAGOGASTRODUODENOSCOPY (EGD);  Surgeon: Lin Landsman, MD;  Location:  [facility] ENDOSCOPY;  Service: Gastroenterology;  Laterality: N/A;  . GIVENS CAPSULE STUDY N/A 03/31/2019   Procedure: GIVENS CAPSULE STUDY;  Surgeon: Lin Landsman, MD;  Location: [facility] ENDOSCOPY;  Service: Gastroenterology;  Laterality: N/A;  . TONSILLECTOMY AND ADENOIDECTOMY  1974  . TOTAL ABDOMINAL HYSTERECTOMY W/ BILATERAL SALPINGOOPHORECTOMY  1981    Family History  Problem Relation Age of Onset  . Heart disease Mother   . Diabetes Mother   . Aneurysm Mother        AAA  . Heart  failure Mother   . Heart failure Father   . Coronary artery disease Father   . Colon cancer Neg Hx   . Breast cancer Neg Hx     SOCIAL HX: Former smoker.   Current Outpatient Medications:  .  amLODipine (NORVASC) 5 MG tablet, TAKE 1 TABLET DAILY (Patient taking differently: Take 5 mg by mouth daily. ), Disp: 90 tablet, Rfl: 3 .  clonazePAM (KLONOPIN) 1 MG tablet, TAKE 1 TABLET BY MOUTH EVERY 8 TO 12 HOURS AS NEEDED FOR ANXIETY (MAX 3 DOSES IN 24 HOURS) (Patient taking differently: Take 1 mg by mouth 2 (two) times daily as needed for anxiety. ), Disp: 90 tablet, Rfl: 0 .  lisinopril (PRINIVIL,ZESTRIL) 40 MG tablet, TAKE 1 TABLET DAILY (Patient taking differently: Take 40 mg by mouth daily. ), Disp: 90 tablet, Rfl: 3 .  pantoprazole (PROTONIX) 40 MG tablet, Take 1 tablet (40 mg total) by mouth 2 (two) times daily before a meal., Disp: 60 tablet, Rfl: 1 .  Cholecalciferol (VITAMIN D3) 2000 units capsule, Take 1 capsule (2,000 Units total) by mouth daily., Disp: 90 capsule, Rfl: 1 .  Cyanocobalamin (B-12) 1000 MCG TABS, Take 1 tablet by mouth daily., Disp: 90 tablet, Rfl: 0 .  traMADol (ULTRAM) 50 MG tablet, Take 1 tablet (50 mg total) by mouth every 12 (twelve) hours as needed., Disp: 14 tablet, Rfl: 0  EXAM: This was a telehealth telephone visit and thus no physical exam was completed.  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  She notes formed mildly black stools and continued dyspnea on exertion.  She is incredibly hesitant to have lab work rechecked as she does not feel as though she can drive herself and she is hesitant to have home health come in to draw her blood work.  After an extensive conversation with her regarding the risk of potential death if she were to be currently bleeding and the fact that she needed repeat lab work to help determine this she was willing to have home health come in to draw her lab work.   This referral was placed.  I advised that if she were to have any increased dyspnea or if she were to have recurrent melena or increased bright red blood per rectum she would need to call 911 for emergency evaluation.  I had an extensive conversation with her regarding the need for her to see GI for follow-up though she declines doing this until her lab work has returned.  I spent the majority of this visit counseling the patient on her upper GI bleed and potential adverse outcomes without further evaluation and follow-up.    I discussed the assessment and treatment plan with the patient. The patient was provided an opportunity to ask questions and all were answered. The patient agreed with the plan and demonstrated an understanding of the instructions.   The patient was advised  to call back or seek an in-person evaluation if the symptoms worsen or if the condition fails to improve as anticipated.  I provided 36 minutes of non-face-to-face time during this encounter.   [name], MD

## 2019-04-05 ENCOUNTER — Telehealth: Payer: Self-pay

## 2019-04-05 NOTE — Telephone Encounter (Signed)
Copied from [place] [id number]. Topic: General - Other >> Apr 05, 2019  9:28 AM Virl Axe D wrote: Reason for CRM: Pt stated she wanted Dr. Caryl Bis to know that she has gotten some energy back after her appointment with him on yesterday 04/04/19 and she has had two normal bowel movements that were not black. She would like to know if Dr. Caryl Bis still intends to have someone some out to her residence to have her hemoglobin checked. Requesting callback.

## 2019-04-05 NOTE — Telephone Encounter (Signed)
I do want her hemoglobin rechecked. I am glad she is feeling better though we will need to have this checked to ensure it is improving.

## 2019-04-06 ENCOUNTER — Encounter: Payer: Self-pay | Admitting: Gastroenterology

## 2019-04-06 ENCOUNTER — Telehealth: Payer: Self-pay | Admitting: Gastroenterology

## 2019-04-06 MED ORDER — TRAMADOL HCL 50 MG PO TABS: 50.0000 mg | ORAL_TABLET | Freq: Two times a day (BID) | ORAL | 0 refills | Status: DC | PRN

## 2019-04-06 NOTE — Telephone Encounter (Signed)
Called pt to schedule f/u apt with Dr. Marius Ditch for hospital f/u she declined apt at this time and states she will call us back

## 2019-04-06 NOTE — Telephone Encounter (Signed)
-----   Message from Virgel Manifold, MD sent at 04/06/2019  9:31 AM EDT ----- Can we try to contact this pt again to make an appt with Dr. Marius Ditch.

## 2019-04-08 ENCOUNTER — Encounter: Payer: Self-pay | Admitting: Family Medicine

## 2019-04-09 DIAGNOSIS — I10 Essential (primary) hypertension: Secondary | ICD-10-CM | POA: Diagnosis not present

## 2019-04-09 DIAGNOSIS — F419 Anxiety disorder, unspecified: Secondary | ICD-10-CM | POA: Diagnosis not present

## 2019-04-09 DIAGNOSIS — Z87891 Personal history of nicotine dependence: Secondary | ICD-10-CM | POA: Diagnosis not present

## 2019-04-09 DIAGNOSIS — G43909 Migraine, unspecified, not intractable, without status migrainosus: Secondary | ICD-10-CM | POA: Diagnosis not present

## 2019-04-09 DIAGNOSIS — M170 Bilateral primary osteoarthritis of knee: Secondary | ICD-10-CM | POA: Diagnosis not present

## 2019-04-09 DIAGNOSIS — Z8673 Personal history of transient ischemic attack (TIA), and cerebral infarction without residual deficits: Secondary | ICD-10-CM | POA: Diagnosis not present

## 2019-04-09 DIAGNOSIS — M7072 Other bursitis of hip, left hip: Secondary | ICD-10-CM | POA: Diagnosis not present

## 2019-04-09 DIAGNOSIS — Z90710 Acquired absence of both cervix and uterus: Secondary | ICD-10-CM | POA: Diagnosis not present

## 2019-04-09 DIAGNOSIS — Z86018 Personal history of other benign neoplasm: Secondary | ICD-10-CM | POA: Diagnosis not present

## 2019-04-09 DIAGNOSIS — G2581 Restless legs syndrome: Secondary | ICD-10-CM | POA: Diagnosis not present

## 2019-04-09 DIAGNOSIS — M810 Age-related osteoporosis without current pathological fracture: Secondary | ICD-10-CM | POA: Diagnosis not present

## 2019-04-09 DIAGNOSIS — K922 Gastrointestinal hemorrhage, unspecified: Secondary | ICD-10-CM | POA: Diagnosis not present

## 2019-04-09 DIAGNOSIS — Z90722 Acquired absence of ovaries, bilateral: Secondary | ICD-10-CM | POA: Diagnosis not present

## 2019-04-09 LAB — CBC AND DIFFERENTIAL
HCT: 36 (ref 36–46)
Hemoglobin: 11.6 — AB (ref 12.0–16.0)
Platelets: 509 — AB (ref 150–399)
WBC: 7.8
WBC: 7.8

## 2019-04-09 NOTE — Assessment & Plan Note (Addendum)
Likely related to an AVM.  She notes formed mildly black stools and continued dyspnea on exertion.  She is incredibly hesitant to have lab work rechecked as she does not feel as though she can drive herself and she is hesitant to have home health come in to draw her blood work.  After an extensive conversation with her regarding the risk of potential death if she were to be currently bleeding and the fact that she needed repeat lab work to help determine this she was willing to have home health come in to draw her lab work.  This referral was placed.  I advised that if she were to have any increased dyspnea or if she were to have recurrent melena or increased bright red blood per rectum she would need to call 911 for emergency evaluation.  I had an extensive conversation with her regarding the need for her to see GI for follow-up though she declines doing this until her lab work has returned.  I spent the majority of this visit counseling the patient on her upper GI bleed and potential adverse outcomes without further evaluation and follow-up.

## 2019-04-10 ENCOUNTER — Telehealth: Payer: Self-pay | Admitting: Family Medicine

## 2019-04-10 DIAGNOSIS — Z8673 Personal history of transient ischemic attack (TIA), and cerebral infarction without residual deficits: Secondary | ICD-10-CM

## 2019-04-10 DIAGNOSIS — M1990 Unspecified osteoarthritis, unspecified site: Secondary | ICD-10-CM

## 2019-04-10 NOTE — Telephone Encounter (Signed)
Home Health Verbal Orders - Caller/Agency: Amanda/ [facility] Callback Number: [phone] [id number]  Requesting OT/PT/Skilled Nursing/Social Work/Speech Therapy: Home Health nursing Frequency: 1wk for 6x  Estill Bamberg is also requesting a [facility] for [facility]. Please advise.

## 2019-04-10 NOTE — Telephone Encounter (Signed)
Called and spoke to [place] at [facility].  Gave verbal orders for skilled nursing as requested for 1wk for 6w.  Estill Bamberg also is requesting prescription for a cane to be sent to [place] for pt.  Estill Bamberg said that pt's balance is impaired from pain caused from bursitis.  Estill Bamberg said that pt is using a golf club to walk up and down steps in her home and to walk down a hill to get to her mail box and pt using furniture to steady herself to move around in the home.  Estill Bamberg also wanted to inform Dr. Caryl Bis that pt had a blood draw yesterday for CBC he requested.

## 2019-04-11 NOTE — Telephone Encounter (Signed)
Noted. I am happy to provide a prescription for a cane. I will forward to Juliann Pulse to see if she can place a DME order for this as I have not been able to find the appropriate order for it to print out.

## 2019-04-12 NOTE — Telephone Encounter (Signed)
DME printed , signed and given to Gae Bon.

## 2019-04-12 NOTE — Telephone Encounter (Signed)
Faxed the DMI for cane and received a confirmation.  Nina,cma

## 2019-04-13 ENCOUNTER — Other Ambulatory Visit: Payer: Self-pay | Admitting: Family Medicine

## 2019-04-14 ENCOUNTER — Ambulatory Visit: Payer: Self-pay | Admitting: Family Medicine

## 2019-04-14 ENCOUNTER — Ambulatory Visit: Payer: Self-pay

## 2019-04-14 NOTE — Telephone Encounter (Signed)
Pt. Reports her bursitis in her left hip "is unbearable. I took 2 Tramadol and it didn't help." Pain is 10/10.Walking with her husbands walker.Wants to know if taking Advil with it will help, or what should she try. Please advise.  Answer Assessment - Initial Assessment Questions 1. LOCATION and RADIATION: "Where is the pain located?"      Left hip pain 2. QUALITY: "What does the pain feel like?"  (e.g., sharp, dull, aching, burning)     Sharp pain 3. SEVERITY: "How bad is the pain?" "What does it keep you from doing?"   (Scale 1-10; or mild, moderate, severe)   -  MILD (1-3): doesn't interfere with normal activities    -  MODERATE (4-7): interferes with normal activities (e.g., work or school) or awakens from sleep, limping    -  SEVERE (8-10): excruciating pain, unable to do any normal activities, unable to walk     10 4. ONSET: "When did the pain start?" "Does it come and go, or is it there all the time?"     12:30 today 5. WORK OR EXERCISE: "Has there been any recent work or exercise that involved this part of the body?"      No 6. CAUSE: "What do you think is causing the hip pain?"      Bursitis 7. AGGRAVATING FACTORS: "What makes the hip pain worse?" (e.g., walking, climbing stairs, running)     Walking makes it worse 8. OTHER SYMPTOMS: "Do you have any other symptoms?" (e.g., back pain, pain shooting down leg,  fever, rash)     No  Protocols used: HIP PAIN-A-AH

## 2019-04-14 NOTE — Telephone Encounter (Signed)
Pt called stating that she had called early today and left message for call back with the office.  She states that she has severe left hip pain and needs advice as to what she can do for the pain.  She states she has been walking through the house trying to exercise the hip. She has been Dx with Bursitis. She is prescribed Tramadol and had taken 2 tabs in the past few hours without relief. Patient was cautions not to exceed the dosing on the RX  It reads 1 tab every 12 hours.  She was advised to not take aleve because of GI bleed Hx. Per protocol patient was advised to go to urgent care or ER for pain control. Pt refused stating that she has nobody to drive and see can not in this much pain. NT offered Saturday clinic appointment. She was told it was a virtual visit. She refused stating that it would not help her pain. She was urged to call back before 7pm if she changed her mind. NT advised her to rest with heat and ice alternating every 20 minutes. She states that she turned her Ice maker off and had no ice.  Reason for Disposition . [1] SEVERE pain (e.g., excruciating, unable to do any normal activities) AND [2] not improved after 2 hours of pain medicine  Answer Assessment - Initial Assessment Questions 1. LOCATION and RADIATION: "Where is the pain located?"      Left hip 2. QUALITY: "What does the pain feel like?"  (e.g., sharp, dull, aching, burning)     sharp 3. SEVERITY: "How bad is the pain?" "What does it keep you from doing?"   (Scale 1-10; or mild, moderate, severe)   -  MILD (1-3): doesn't interfere with normal activities    -  MODERATE (4-7): interferes with normal activities (e.g., work or school) or awakens from sleep, limping    -  SEVERE (8-10): excruciating pain, unable to do any normal activities, unable to walk     10 4. ONSET: "When did the pain start?" "Does it come and go, or is it there all the time?"    today 5. WORK OR EXERCISE: "Has there been any recent work or exercise  that involved this part of the body?"     no 6. CAUSE: "What do you think is causing the hip pain?"     Burcitis 7. AGGRAVATING FACTORS: "What makes the hip pain worse?" (e.g., walking, climbing stairs, running)     walking 8. OTHER SYMPTOMS: "Do you have any other symptoms?" (e.g., back pain, pain shooting down leg,  fever, rash)     none  Protocols used: HIP PAIN-A-AH

## 2019-04-16 ENCOUNTER — Telehealth: Payer: Self-pay | Admitting: Family Medicine

## 2019-04-16 NOTE — Telephone Encounter (Addendum)
Please let the patient know that her hemoglobin is now 11.6. This is near the normal range and much improved from her prior values during hospitalization.  Her platelets were slightly elevated.  I would like to recheck these again.  They are likely elevated in reaction to her GI bleed and her bone marrow increasing production of blood cells.  We will need to see if we can get this completed through home health.  Please see my other phone note as well. Thanks.

## 2019-04-16 NOTE — Telephone Encounter (Signed)
Please contact the patient and let her know that I received a message from GI.  They noted that she needed to have the abdominal x-ray to ensure that the capsule study that they did passed completely through her body.  I would advise that the patient has this x-ray completed.  She would need to have this completed at radiology at the hospital.  I would strongly recommend that she complete this.  Thanks.

## 2019-04-16 NOTE — Telephone Encounter (Signed)
-----   Message from Virgel Manifold, MD sent at 04/14/2019  1:06 PM EDT ----- Dr. Caryl Bis,  I am wondering if you can help me with this pt. I saw her as an inpatient and she has refused follow up in clinic when we have contacted her. Even she chooses to not come to clinic, she needs an Abdominal X-ray to ensure the capsule study we did has passed her body. We have messaged her about this too and she has not had this done.  I have already ordered it. It appears she has been communicating with your office. Would you be able to have your office reach out to her and request that she atleast get the x-ray done. Thank you so much!

## 2019-04-17 ENCOUNTER — Encounter: Payer: Self-pay | Admitting: Family Medicine

## 2019-04-17 ENCOUNTER — Telehealth: Payer: Self-pay

## 2019-04-17 NOTE — Telephone Encounter (Signed)
See other phone note

## 2019-04-17 NOTE — Telephone Encounter (Signed)
The panapazole cause her to have headaches and so she stopped taking it.  Lumir Demetriou,cma

## 2019-04-17 NOTE — Telephone Encounter (Signed)
Given her recent GI bleed she needs to avoid ibuprofen and aleve due to risk of bleeding. She can take tramadol 50 mg every 6 hours and she could take tylenol over the counter with dosing of 1000 mg every 8 hours as needed. She really needs to see orthopedics for this to determine the best treatment regimen as the above regimen with just treat the patient and not necessarily the underlying cause. Thanks.

## 2019-04-17 NOTE — Telephone Encounter (Signed)
Copied from [place] [id number]. Topic: General - Other >> Apr 17, 2019 11:18 AM Yvette Rack wrote: Reason for CRM: Pt stated she saw where her platelet count went to 500 and she would like to know if she should start back taking the Plavix. Pt requests call back. Cb# [phone]

## 2019-04-17 NOTE — Telephone Encounter (Signed)
Patient states she has stopped some meds and she is only taking amlodipine, clonazapam, flexeril and she has stopped the tramadol but shen she was taking it she was doing every 12 hours as needed.  She stopped the panapazole

## 2019-04-17 NOTE — Telephone Encounter (Signed)
Patient stated she would never go back to hospital  And she was not doing the xray again she is having regular bowel movement and her stool is not black she is not having any issues at all. Patient states they were lying to her about the capsule and it was all an experiment, she states she is not going to the orthopedic because she cannot take them pushing and tugging and pressing on her hip when she is in pain, she is not going to a rehab facility with covid going on and she completely refuses to have hip surgery when she lives by herself.  Toni Berger,cma

## 2019-04-17 NOTE — Telephone Encounter (Signed)
Noted. She really does need to have the x-ray completed to help determine if she passed the capsule. If she has a retained capsule there is a chance that she could end up with side effects of it being retained such as blockage or perforation (hole in the lining) of her bowels.  Please also see if she would be willing to do physical therapy through home health for her hip.  That may be beneficial for her hip pain.

## 2019-04-17 NOTE — Telephone Encounter (Signed)
Pt calling to check status. Pt seems very concerned

## 2019-04-17 NOTE — Telephone Encounter (Signed)
MyChart message sent to patient.  Please contact her to ensure that she received this.

## 2019-04-18 ENCOUNTER — Other Ambulatory Visit: Payer: Self-pay | Admitting: Family Medicine

## 2019-04-18 ENCOUNTER — Telehealth: Payer: Self-pay

## 2019-04-18 NOTE — Telephone Encounter (Signed)
See other mychart message.

## 2019-04-18 NOTE — Telephone Encounter (Signed)
Copied from [place] [id number]. Topic: General - Other >> Apr 17, 2019 11:18 AM Yvette Rack wrote: Reason for CRM: Pt stated she saw where her platelet count went to 500 and she would like to know if she should start back taking the Plavix. Pt requests call back. Cb# [phone] >> Apr 18, 2019 12:35 PM Celene Kras A wrote: Pt called stating she is needing to ask questions about the blood draw that is supposed to be done tomorrow. Please advise.

## 2019-04-18 NOTE — Telephone Encounter (Signed)
Please call [facility] and see if they can draw a CBC on the patient when they go out tomorrow to her house. Thanks.

## 2019-04-19 ENCOUNTER — Telehealth: Payer: Self-pay

## 2019-04-19 DIAGNOSIS — M7072 Other bursitis of hip, left hip: Secondary | ICD-10-CM | POA: Diagnosis not present

## 2019-04-19 DIAGNOSIS — D649 Anemia, unspecified: Secondary | ICD-10-CM | POA: Diagnosis not present

## 2019-04-19 DIAGNOSIS — F419 Anxiety disorder, unspecified: Secondary | ICD-10-CM | POA: Diagnosis not present

## 2019-04-19 DIAGNOSIS — K922 Gastrointestinal hemorrhage, unspecified: Secondary | ICD-10-CM | POA: Diagnosis not present

## 2019-04-19 DIAGNOSIS — G43909 Migraine, unspecified, not intractable, without status migrainosus: Secondary | ICD-10-CM | POA: Diagnosis not present

## 2019-04-19 DIAGNOSIS — M810 Age-related osteoporosis without current pathological fracture: Secondary | ICD-10-CM | POA: Diagnosis not present

## 2019-04-19 DIAGNOSIS — I10 Essential (primary) hypertension: Secondary | ICD-10-CM | POA: Diagnosis not present

## 2019-04-19 LAB — CBC AND DIFFERENTIAL
HCT: 37 (ref 36–46)
HCT: 37 (ref 36–46)
Hemoglobin: 12 (ref 12.0–16.0)
Hemoglobin: 12 (ref 12.0–16.0)
Platelets: 367 (ref 150–399)
WBC: 8.5
WBC: 8.5

## 2019-04-19 NOTE — Telephone Encounter (Signed)
Pt called for an update on the refill requests. Pt requests call back.

## 2019-04-19 NOTE — Telephone Encounter (Signed)
Pt stated she saw where her platelet count went to 500 and she would like to know if she should start back taking the Plavix.

## 2019-04-19 NOTE — Telephone Encounter (Signed)
My chart message already sent to patient regarding this.  Please call [facility] and see if they can draw a CBC on the patient when they go out tomorrow to her house. Thanks.

## 2019-04-20 ENCOUNTER — Telehealth: Payer: Self-pay | Admitting: Gastroenterology

## 2019-04-20 ENCOUNTER — Encounter: Payer: Self-pay | Admitting: Family Medicine

## 2019-04-20 ENCOUNTER — Telehealth: Payer: Self-pay | Admitting: Family Medicine

## 2019-04-20 NOTE — Telephone Encounter (Addendum)
Mychart message sent to patient.

## 2019-04-20 NOTE — Telephone Encounter (Signed)
No

## 2019-04-20 NOTE — Telephone Encounter (Signed)
Patient does not want PT and refuses X-ray stating she has no pain and normal bowel movements.

## 2019-04-20 NOTE — Telephone Encounter (Signed)
Pt has been contacted by our clinic multiple times to schedule Abdominal X-ray (ordered), to ensure small bowel capsule has left the small bowel, and for clinic follow up of anemia. Pt has not made these appointments.   I reached out to patient's PCP Dr. Caryl Bis in this regard and he has discussed these things with the pt in clinic. See his notes. She refuses Abdominal X-ray. He will also discuss need for colonoscopy with her and explain risks and benefits of resuming Plavix with her. He states her Hgb has improved to 12. He will encourage her to follow up with [place] as well.  We would be happy to see her in our clinic once she agrees to follow up. But for now she is refusing seeing [place] in clinic at a risk to her own health and management despite our attempts to reach out to her and PCPs discussions with her.

## 2019-04-20 NOTE — Telephone Encounter (Signed)
Patient called to the front desk of [facility] stating she was mistreated @ [facility] and will not go back for treatment. She would have been better off dead(per her statement).She complained of the nurse that covered  to get extra hours  from CT or ICU department. Stating he would not leave his number on the board & then once he did would not answer. She is very unhappy with him!!! She also states she had  some form of run in with the physical therapist.  She was given a pill to take that caused her to go to the bathroom with explosive diarrhea with no one to assist her,but they had to clean her several times. She had 2 units of blood & was given iron to where her stool was black. I continued to ask her several times how I could help her? I then placed her on speaker phone & she ask had I done so? I responded yes. She wanted to know who was listening I explained it was my co-worker Tati so we could try to help her. I explained since this took place in the hospital our office was unable to help her, put Patient Visitor Relations could.I took her # & stated I would get the information so they could help her research what happened & why. I called & got the number [phone] then called her with this information explaining the their hours where from 8:30 to 5:00. She wanted to know if they would call her. I explained they would to leave her name,DOB,phone number with a brief explanation for the call. She mentioned Dr Bonna Gains & Dr Marius Ditch and is aware they are [facility] physician's. She refuses to return for treatment at [facility]!!! She has requested that our office not call her or harass her primary care. I explained This could have been for legal reasons. There may have been more to the conversation but I'm unable to recall all in details.

## 2019-04-20 NOTE — Telephone Encounter (Signed)
Patient says he has received results.

## 2019-04-20 NOTE — Telephone Encounter (Signed)
Clonazepam   Refilled: 03/16/2019 Tramadol   Refilled: 04/06/2019  Last OV: 04/04/2019 Next OV: 06/27/2019

## 2019-04-20 NOTE — Telephone Encounter (Signed)
She already had the blood draw done. Please see if she has any additional questions.

## 2019-04-21 ENCOUNTER — Other Ambulatory Visit: Payer: Self-pay | Admitting: Family Medicine

## 2019-04-21 ENCOUNTER — Telehealth: Payer: Self-pay | Admitting: Family Medicine

## 2019-04-21 MED ORDER — CLONAZEPAM 1 MG PO TABS: ORAL_TABLET | ORAL | 0 refills | Status: DC

## 2019-04-21 MED ORDER — TRAMADOL HCL 50 MG PO TABS: 50.0000 mg | ORAL_TABLET | Freq: Two times a day (BID) | ORAL | 0 refills | Status: DC | PRN

## 2019-04-21 NOTE — Telephone Encounter (Signed)
Relation to pt: self  Call back number:  [phone]  Pharmacy: [id number] - [place], [place] - [address] [phone] (Phone) [phone] (Fax)     Reason for call:  Patient states she would like to speak with a nurse today regarding the status of traMADol (ULTRAM) 50 MG tablet refill request, please advise

## 2019-04-21 NOTE — Telephone Encounter (Signed)
error:[id number] ° °

## 2019-04-25 NOTE — Telephone Encounter (Signed)
Okay to refill? LOV 04/04/19

## 2019-04-26 DIAGNOSIS — F419 Anxiety disorder, unspecified: Secondary | ICD-10-CM | POA: Diagnosis not present

## 2019-04-26 DIAGNOSIS — M7072 Other bursitis of hip, left hip: Secondary | ICD-10-CM | POA: Diagnosis not present

## 2019-04-26 DIAGNOSIS — G43909 Migraine, unspecified, not intractable, without status migrainosus: Secondary | ICD-10-CM | POA: Diagnosis not present

## 2019-04-26 DIAGNOSIS — K922 Gastrointestinal hemorrhage, unspecified: Secondary | ICD-10-CM | POA: Diagnosis not present

## 2019-04-26 DIAGNOSIS — M810 Age-related osteoporosis without current pathological fracture: Secondary | ICD-10-CM | POA: Diagnosis not present

## 2019-04-26 DIAGNOSIS — I10 Essential (primary) hypertension: Secondary | ICD-10-CM | POA: Diagnosis not present

## 2019-04-27 ENCOUNTER — Encounter: Payer: Self-pay | Admitting: Family Medicine

## 2019-04-27 NOTE — Telephone Encounter (Signed)
Mychart message sent to patient. Please contact her Friday to ensure that she received and understood the message. Thanks.

## 2019-05-03 ENCOUNTER — Other Ambulatory Visit: Payer: Self-pay | Admitting: Family Medicine

## 2019-05-03 ENCOUNTER — Telehealth: Payer: Self-pay | Admitting: *Deleted

## 2019-05-03 DIAGNOSIS — I10 Essential (primary) hypertension: Secondary | ICD-10-CM | POA: Diagnosis not present

## 2019-05-03 DIAGNOSIS — K922 Gastrointestinal hemorrhage, unspecified: Secondary | ICD-10-CM | POA: Diagnosis not present

## 2019-05-03 DIAGNOSIS — M810 Age-related osteoporosis without current pathological fracture: Secondary | ICD-10-CM | POA: Diagnosis not present

## 2019-05-03 DIAGNOSIS — M7072 Other bursitis of hip, left hip: Secondary | ICD-10-CM | POA: Diagnosis not present

## 2019-05-03 DIAGNOSIS — G43909 Migraine, unspecified, not intractable, without status migrainosus: Secondary | ICD-10-CM | POA: Diagnosis not present

## 2019-05-03 DIAGNOSIS — F419 Anxiety disorder, unspecified: Secondary | ICD-10-CM | POA: Diagnosis not present

## 2019-05-03 MED ORDER — TRAMADOL HCL 50 MG PO TABS: 50.0000 mg | ORAL_TABLET | Freq: Two times a day (BID) | ORAL | 0 refills | Status: DC | PRN

## 2019-05-03 NOTE — Telephone Encounter (Signed)
Copied from [place] [phone]. Topic: Quick Communication - Rx Refill/Question >> May 03, 2019  8:42 AM Rainey Pines A wrote: Medication:traMADol (ULTRAM) 50 MG tablet   Has the patient contacted their pharmacy? Yes (Agent: If no, request that the patient contact the pharmacy for the refill.) (Agent: If yes, when and what did the pharmacy advise?)Contact PCP  Preferred Pharmacy (with phone number or street name):[id number] - [place], [place] - [address] [phone] (Phone) [phone] (Fax)    Agent: Please be advised that RX refills may take up to 3 business days. We ask that you follow-up with your pharmacy.

## 2019-05-03 NOTE — Telephone Encounter (Signed)
Copied from [place] [phone]. Topic: General - Inquiry >> May 03, 2019 12:28 PM Virl Axe D wrote: Reason for CRM: Pt would like to know if Dr. Caryl Bis will extend order for [facility] to continue coming out to her home due to her being back on Plavix. Please advise

## 2019-05-04 ENCOUNTER — Other Ambulatory Visit: Payer: Self-pay | Admitting: Family Medicine

## 2019-05-05 NOTE — Telephone Encounter (Signed)
Pt would like to know if Dr. Caryl Bis will extend order for [facility] to continue coming out to her home due to her being back on Plavix. Please advise

## 2019-05-07 NOTE — Telephone Encounter (Signed)
Please check with [facility] to see if they feel that the patient would benefit from continued visits from their perspective. Things have stabilized at this point for her and she may not meet criteria for further visits.

## 2019-05-09 DIAGNOSIS — G2581 Restless legs syndrome: Secondary | ICD-10-CM | POA: Diagnosis not present

## 2019-05-09 DIAGNOSIS — M170 Bilateral primary osteoarthritis of knee: Secondary | ICD-10-CM | POA: Diagnosis not present

## 2019-05-09 DIAGNOSIS — G43909 Migraine, unspecified, not intractable, without status migrainosus: Secondary | ICD-10-CM | POA: Diagnosis not present

## 2019-05-09 DIAGNOSIS — K922 Gastrointestinal hemorrhage, unspecified: Secondary | ICD-10-CM | POA: Diagnosis not present

## 2019-05-09 DIAGNOSIS — Z90722 Acquired absence of ovaries, bilateral: Secondary | ICD-10-CM | POA: Diagnosis not present

## 2019-05-09 DIAGNOSIS — Z86018 Personal history of other benign neoplasm: Secondary | ICD-10-CM | POA: Diagnosis not present

## 2019-05-09 DIAGNOSIS — Z8673 Personal history of transient ischemic attack (TIA), and cerebral infarction without residual deficits: Secondary | ICD-10-CM | POA: Diagnosis not present

## 2019-05-09 DIAGNOSIS — Z90710 Acquired absence of both cervix and uterus: Secondary | ICD-10-CM | POA: Diagnosis not present

## 2019-05-09 DIAGNOSIS — M7072 Other bursitis of hip, left hip: Secondary | ICD-10-CM | POA: Diagnosis not present

## 2019-05-09 DIAGNOSIS — F419 Anxiety disorder, unspecified: Secondary | ICD-10-CM | POA: Diagnosis not present

## 2019-05-09 DIAGNOSIS — Z87891 Personal history of nicotine dependence: Secondary | ICD-10-CM | POA: Diagnosis not present

## 2019-05-09 DIAGNOSIS — I10 Essential (primary) hypertension: Secondary | ICD-10-CM | POA: Diagnosis not present

## 2019-05-09 DIAGNOSIS — M810 Age-related osteoporosis without current pathological fracture: Secondary | ICD-10-CM | POA: Diagnosis not present

## 2019-05-10 DIAGNOSIS — K922 Gastrointestinal hemorrhage, unspecified: Secondary | ICD-10-CM | POA: Diagnosis not present

## 2019-05-10 DIAGNOSIS — M810 Age-related osteoporosis without current pathological fracture: Secondary | ICD-10-CM | POA: Diagnosis not present

## 2019-05-10 DIAGNOSIS — F419 Anxiety disorder, unspecified: Secondary | ICD-10-CM | POA: Diagnosis not present

## 2019-05-10 DIAGNOSIS — M7072 Other bursitis of hip, left hip: Secondary | ICD-10-CM | POA: Diagnosis not present

## 2019-05-10 DIAGNOSIS — G43909 Migraine, unspecified, not intractable, without status migrainosus: Secondary | ICD-10-CM | POA: Diagnosis not present

## 2019-05-10 DIAGNOSIS — I10 Essential (primary) hypertension: Secondary | ICD-10-CM | POA: Diagnosis not present

## 2019-05-12 NOTE — Telephone Encounter (Signed)
I called and spoke with Merry Proud the RN and he stated that no further visits from them is needed, he stated that the LPN that goes out stated that the patient just wanted her to come to her home, they have done all the teachings they have to do and the patient keeps requesting labs drawn and they have informed her that labs do not need to be drawn constantly for Plavix. But at this time home visit are not needed anymore for the patient.  Nina,cma

## 2019-05-14 NOTE — Telephone Encounter (Signed)
Noted. Please let the patient know that home visits are no longer needed or recommended by the home health company. Given that her blood counts have improved I do not think that there is a role for home health for the patient at this time. We can arrange for any further follow-up lab work through our office.

## 2019-05-15 ENCOUNTER — Telehealth: Payer: Self-pay

## 2019-05-15 NOTE — Telephone Encounter (Signed)
Copied from [place] [phone]. Topic: General - Inquiry >> May 15, 2019  3:59 PM Virl Axe D wrote: Reason for CRM: Pt stated she would like to know if Dr. Caryl Bis has received labs for her since she was on Plavix and if not, does he need them? Also stated she was told that she would have to have another intake done by Home Health due to starting Plavix. Requesting callback.

## 2019-05-16 ENCOUNTER — Other Ambulatory Visit: Payer: Self-pay | Admitting: Family Medicine

## 2019-05-16 ENCOUNTER — Other Ambulatory Visit: Payer: Self-pay

## 2019-05-16 ENCOUNTER — Telehealth: Payer: Self-pay

## 2019-05-16 ENCOUNTER — Encounter: Payer: Self-pay | Admitting: Family Medicine

## 2019-05-16 MED ORDER — CLONAZEPAM 1 MG PO TABS: ORAL_TABLET | ORAL | 0 refills | Status: DC

## 2019-05-16 MED ORDER — TRAMADOL HCL 50 MG PO TABS: 50.0000 mg | ORAL_TABLET | Freq: Two times a day (BID) | ORAL | 0 refills | Status: DC | PRN

## 2019-05-16 NOTE — Telephone Encounter (Signed)
Tramadol sent to pharmacy. She is due for that. She is not due for the clonazepam until 05/22/19. I sent the refill of clonazepam to be filled on that day.

## 2019-05-16 NOTE — Telephone Encounter (Signed)
I called and spoke with the patient and informed her that the tramadol was sent to pharmacy and the clonazepam will be sent on 05/22/2019.  Nina,cma

## 2019-05-16 NOTE — Telephone Encounter (Signed)
Reason for CRM: Pt called and is requesting to know why she cannot get her tramadol refilled. Pt is requesting a call back and not a mychart message. Please advise.

## 2019-05-16 NOTE — Telephone Encounter (Signed)
Copied from [place] [phone]. Topic: General - Other >> May 16, 2019 10:37 AM Celene Kras A wrote: Reason for CRM: Pt called and is requesting to know why she cannot get her tramadol refilled. Pt is requesting a call back and not a mychart message. Please advise. >> May 16, 2019 11:52 AM Yvette Rack wrote: Pt called again stating that it has been 26 days since the Rx for tramadol was filled so she can not understand why it is being denied. Pt requests call back.

## 2019-05-18 DIAGNOSIS — M7072 Other bursitis of hip, left hip: Secondary | ICD-10-CM | POA: Diagnosis not present

## 2019-05-18 DIAGNOSIS — M810 Age-related osteoporosis without current pathological fracture: Secondary | ICD-10-CM | POA: Diagnosis not present

## 2019-05-18 DIAGNOSIS — F419 Anxiety disorder, unspecified: Secondary | ICD-10-CM | POA: Diagnosis not present

## 2019-05-18 DIAGNOSIS — G43909 Migraine, unspecified, not intractable, without status migrainosus: Secondary | ICD-10-CM | POA: Diagnosis not present

## 2019-05-18 DIAGNOSIS — K922 Gastrointestinal hemorrhage, unspecified: Secondary | ICD-10-CM | POA: Diagnosis not present

## 2019-05-18 DIAGNOSIS — I10 Essential (primary) hypertension: Secondary | ICD-10-CM | POA: Diagnosis not present

## 2019-05-25 ENCOUNTER — Other Ambulatory Visit: Payer: Self-pay

## 2019-05-25 MED ORDER — CLONAZEPAM 1 MG PO TABS: ORAL_TABLET | ORAL | 0 refills | Status: DC

## 2019-05-28 ENCOUNTER — Encounter: Payer: Self-pay | Admitting: Family Medicine

## 2019-05-28 ENCOUNTER — Other Ambulatory Visit: Payer: Self-pay | Admitting: Family Medicine

## 2019-05-29 MED ORDER — TRAMADOL HCL 50 MG PO TABS: 50.0000 mg | ORAL_TABLET | Freq: Two times a day (BID) | ORAL | 0 refills | Status: DC | PRN

## 2019-06-01 ENCOUNTER — Telehealth: Payer: Self-pay

## 2019-06-01 NOTE — Telephone Encounter (Signed)
Copied from [place] [phone]. Topic: Quick Communication - See Telephone Encounter >> Jun 01, 2019  9:28 AM Loma Boston wrote: CRM for notification. See Telephone encounter for: 06/01/19. [facility]) pt was with [facility] was released but they have a 30/60/90 FU [facility] now states that she is much weaker upon check up and is requesting PT again contact mike at cell [phone]

## 2019-06-06 NOTE — Telephone Encounter (Signed)
Noted.  Please give them a verbal order for this.  Please contact the patient to see how she is doing.  Thanks.

## 2019-06-06 NOTE — Telephone Encounter (Signed)
I called and gave a Verbal PT to main office @ [phone] for this patient.  Verbal accepted.  Nina,cma

## 2019-06-06 NOTE — Telephone Encounter (Signed)
I called and checked on patient and she states she has a new symptom, every time she stands she wavers back and forth and she has to hold on to something, also she states she is having right arm pain again, I informed her that I gave a verbal to [facility] and they  should be reaching out to her soon and she understood.  Twan Harkin,cma

## 2019-06-06 NOTE — Telephone Encounter (Signed)
Noted.  Please offer her a visit for evaluation of that unsteadiness.  Thanks.

## 2019-06-06 NOTE — Telephone Encounter (Signed)
See Telephone encounter for: 06/01/19. [facility] at [facility]) [facility] was with [facility] was released but they have a 30/60/90 FU [facility] now states that she is much weaker upon check up and is requesting PT again contact mike at cell [phone]

## 2019-06-08 ENCOUNTER — Other Ambulatory Visit: Payer: Self-pay

## 2019-06-08 NOTE — Telephone Encounter (Signed)
Noted. Thanks.

## 2019-06-08 NOTE — Telephone Encounter (Signed)
I scheduled the patient to be evaluated for her unsteadiness.  Nina,cma

## 2019-06-10 DIAGNOSIS — Z8673 Personal history of transient ischemic attack (TIA), and cerebral infarction without residual deficits: Secondary | ICD-10-CM | POA: Diagnosis not present

## 2019-06-10 DIAGNOSIS — Z87891 Personal history of nicotine dependence: Secondary | ICD-10-CM | POA: Diagnosis not present

## 2019-06-10 DIAGNOSIS — G43909 Migraine, unspecified, not intractable, without status migrainosus: Secondary | ICD-10-CM | POA: Diagnosis not present

## 2019-06-10 DIAGNOSIS — Z90710 Acquired absence of both cervix and uterus: Secondary | ICD-10-CM | POA: Diagnosis not present

## 2019-06-10 DIAGNOSIS — E785 Hyperlipidemia, unspecified: Secondary | ICD-10-CM | POA: Diagnosis not present

## 2019-06-10 DIAGNOSIS — I10 Essential (primary) hypertension: Secondary | ICD-10-CM | POA: Diagnosis not present

## 2019-06-10 DIAGNOSIS — M7072 Other bursitis of hip, left hip: Secondary | ICD-10-CM | POA: Diagnosis not present

## 2019-06-10 DIAGNOSIS — Z90722 Acquired absence of ovaries, bilateral: Secondary | ICD-10-CM | POA: Diagnosis not present

## 2019-06-10 DIAGNOSIS — Z86018 Personal history of other benign neoplasm: Secondary | ICD-10-CM | POA: Diagnosis not present

## 2019-06-10 DIAGNOSIS — M810 Age-related osteoporosis without current pathological fracture: Secondary | ICD-10-CM | POA: Diagnosis not present

## 2019-06-10 DIAGNOSIS — M170 Bilateral primary osteoarthritis of knee: Secondary | ICD-10-CM | POA: Diagnosis not present

## 2019-06-10 DIAGNOSIS — G2581 Restless legs syndrome: Secondary | ICD-10-CM | POA: Diagnosis not present

## 2019-06-10 DIAGNOSIS — F419 Anxiety disorder, unspecified: Secondary | ICD-10-CM | POA: Diagnosis not present

## 2019-06-11 ENCOUNTER — Other Ambulatory Visit: Payer: Self-pay | Admitting: Family Medicine

## 2019-06-11 ENCOUNTER — Encounter: Payer: Self-pay | Admitting: Family Medicine

## 2019-06-12 ENCOUNTER — Ambulatory Visit (INDEPENDENT_AMBULATORY_CARE_PROVIDER_SITE_OTHER): Payer: Medicare Other | Admitting: Family Medicine

## 2019-06-12 ENCOUNTER — Telehealth: Payer: Self-pay | Admitting: Family Medicine

## 2019-06-12 ENCOUNTER — Encounter: Payer: Self-pay | Admitting: Family Medicine

## 2019-06-12 ENCOUNTER — Other Ambulatory Visit: Payer: Self-pay

## 2019-06-12 ENCOUNTER — Ambulatory Visit (INDEPENDENT_AMBULATORY_CARE_PROVIDER_SITE_OTHER): Payer: Medicare Other

## 2019-06-12 VITALS — BP 120/80 | HR 78 | Temp 97.8°F | Ht 68.0 in | Wt 197.2 lb

## 2019-06-12 DIAGNOSIS — M7541 Impingement syndrome of right shoulder: Secondary | ICD-10-CM | POA: Diagnosis not present

## 2019-06-12 DIAGNOSIS — R7309 Other abnormal glucose: Secondary | ICD-10-CM

## 2019-06-12 DIAGNOSIS — D3502 Benign neoplasm of left adrenal gland: Secondary | ICD-10-CM | POA: Diagnosis not present

## 2019-06-12 DIAGNOSIS — M25511 Pain in right shoulder: Secondary | ICD-10-CM

## 2019-06-12 DIAGNOSIS — M25552 Pain in left hip: Secondary | ICD-10-CM | POA: Insufficient documentation

## 2019-06-12 DIAGNOSIS — R21 Rash and other nonspecific skin eruption: Secondary | ICD-10-CM | POA: Insufficient documentation

## 2019-06-12 DIAGNOSIS — T189XXD Foreign body of alimentary tract, part unspecified, subsequent encounter: Secondary | ICD-10-CM | POA: Diagnosis not present

## 2019-06-12 DIAGNOSIS — R2681 Unsteadiness on feet: Secondary | ICD-10-CM | POA: Diagnosis not present

## 2019-06-12 DIAGNOSIS — G8929 Other chronic pain: Secondary | ICD-10-CM

## 2019-06-12 DIAGNOSIS — R0609 Other forms of dyspnea: Secondary | ICD-10-CM

## 2019-06-12 DIAGNOSIS — R202 Paresthesia of skin: Secondary | ICD-10-CM

## 2019-06-12 DIAGNOSIS — G629 Polyneuropathy, unspecified: Secondary | ICD-10-CM

## 2019-06-12 DIAGNOSIS — M25512 Pain in left shoulder: Secondary | ICD-10-CM | POA: Diagnosis not present

## 2019-06-12 DIAGNOSIS — K922 Gastrointestinal hemorrhage, unspecified: Secondary | ICD-10-CM

## 2019-06-12 DIAGNOSIS — K5641 Fecal impaction: Secondary | ICD-10-CM | POA: Diagnosis not present

## 2019-06-12 LAB — CBC WITH DIFFERENTIAL/PLATELET
Basophils Absolute: 0.1 10*3/uL (ref 0.0–0.1)
Basophils Relative: 1 % (ref 0.0–3.0)
Eosinophils Absolute: 0.2 10*3/uL (ref 0.0–0.7)
Eosinophils Relative: 2.2 % (ref 0.0–5.0)
HCT: 36.8 % (ref 36.0–46.0)
Hemoglobin: 12.1 g/dL (ref 12.0–15.0)
Lymphocytes Relative: 21.1 % (ref 12.0–46.0)
Lymphs Abs: 1.7 10*3/uL (ref 0.7–4.0)
MCHC: 32.9 g/dL (ref 30.0–36.0)
MCV: 84.7 fl (ref 78.0–100.0)
Monocytes Absolute: 0.8 10*3/uL (ref 0.1–1.0)
Monocytes Relative: 10.3 % (ref 3.0–12.0)
Neutro Abs: 5.1 10*3/uL (ref 1.4–7.7)
Neutrophils Relative %: 65.4 % (ref 43.0–77.0)
Platelets: 326 10*3/uL (ref 150.0–400.0)
RBC: 4.35 Mil/uL (ref 3.87–5.11)
RDW: 15.1 % (ref 11.5–15.5)
WBC: 7.8 10*3/uL (ref 4.0–10.5)

## 2019-06-12 LAB — COMPREHENSIVE METABOLIC PANEL
ALT: 16 U/L (ref 0–35)
AST: 18 U/L (ref 0–37)
Albumin: 4.1 g/dL (ref 3.5–5.2)
Alkaline Phosphatase: 95 U/L (ref 39–117)
BUN: 23 mg/dL (ref 6–23)
CO2: 27 mEq/L (ref 19–32)
Calcium: 9.6 mg/dL (ref 8.4–10.5)
Chloride: 103 mEq/L (ref 96–112)
Creatinine, Ser: 1.04 mg/dL (ref 0.40–1.20)
GFR: 51.89 mL/min — ABNORMAL LOW (ref >60.00)
Glucose, Bld: 78 mg/dL (ref 70–99)
Potassium: 4.2 mEq/L (ref 3.5–5.1)
Sodium: 138 mEq/L (ref 135–145)
Total Bilirubin: 0.4 mg/dL (ref 0.2–1.2)
Total Protein: 7.3 g/dL (ref 6.0–8.3)

## 2019-06-12 LAB — CK: Total CK: 51 U/L (ref 7–177)

## 2019-06-12 LAB — HEMOGLOBIN A1C: Hgb A1c MFr Bld: 5.8 % (ref 4.6–6.5)

## 2019-06-12 LAB — SEDIMENTATION RATE: Sed Rate: 41 mm/hr — ABNORMAL HIGH (ref 0–30)

## 2019-06-12 LAB — BRAIN NATRIURETIC PEPTIDE: Pro B Natriuretic peptide (BNP): 47 pg/mL (ref 0.0–100.0)

## 2019-06-12 LAB — TSH: TSH: 2.21 u[IU]/mL (ref 0.35–4.50)

## 2019-06-12 LAB — VITAMIN B12: Vitamin B-12: 233 pg/mL (ref 211–911)

## 2019-06-12 MED ORDER — TRAMADOL HCL 50 MG PO TABS: 50.0000 mg | ORAL_TABLET | Freq: Two times a day (BID) | ORAL | 0 refills | Status: DC | PRN

## 2019-06-12 NOTE — Assessment & Plan Note (Signed)
Repeat imaging ordered.

## 2019-06-12 NOTE — Assessment & Plan Note (Addendum)
Patient reports unsteadiness on her feet.  She has a benign neurological exam.  I wonder if this could be related to her orthopedic issue in her hip or deconditioning or that the likely neuropathy in her feet.  We will complete lab work to evaluate for any electrolyte abnormalities.  She will monitor.

## 2019-06-12 NOTE — Assessment & Plan Note (Signed)
I suspect her bilateral shoulder discomfort is related to rotator cuff impingement or tendinitis.  I advised that treatment typically includes a steroid injection and physical therapy.  We are unable to do steroids given her allergy to prednisone.  She declines physical therapy.  She will try exercises at home.  If she has excessive pain with the exercises she will let us know.

## 2019-06-12 NOTE — Assessment & Plan Note (Signed)
Undetermined cause of dyspnea.  This could represent deconditioning.  EKG is reassuring.  We will check lab work to evaluate for anemia and other causes.  Discussed that she will likely need to see cardiology.

## 2019-06-12 NOTE — Assessment & Plan Note (Signed)
No evidence of recurrence.  Will obtain a abdominal plain film to evaluate for retention of the capsule.  Discussed preference for 2 view film though the patient declines going to the hospital to have her imaging completed.  We will complete this in the office.

## 2019-06-12 NOTE — Progress Notes (Addendum)
[name], MD Phone: [phone]  Toni Berger is a 73 y.o. female who presents today for follow-up.  Shoulder pain: Bilateral shoulder pain has been going on for quite some time.  Hurts when she lifts her shoulders up as well as internal and external rotation.  She takes tramadol for this.  Unsteadiness: She notes this has been going on for about 1.5 months.  She feels like she sways more when she is tired.  She does not feel like it is related to her chronic hip pain.  No vertiginous symptoms.  No falls.  No numbness.  No focal weakness.  Occasional tingling in her feet.  Rash: Anterior lower legs.  She did have some swelling.  She notes the rash has not resolved.  No itching.  Dyspnea on exertion: This has been going on since she came home from the hospital.  She denies chest pain.  She notes if she does something at all strenuous she will be Hersh of breath and have to rest.  No orthopnea or PND.  No melena.  She was evaluated by physical therapy with home health and she does not want to proceed with that.  She notes when they came out her legs hurt more than previously being evaluated.  Prior capsule endoscopy: Patient did not have follow-up imaging to ensure passage of the capsule.  Adrenal adenoma: Patient is due for repeat imaging.  Prior lab evaluation was unremarkable.  Social History   Tobacco Use  Smoking Status Former Smoker  Smokeless Tobacco Never Used     ROS see history of present illness  Objective  Physical Exam Vitals:   06/12/19 1111  BP: 120/80  Pulse: 78  Temp: 97.8 F (36.6 C)  SpO2: 97%    BP Readings from Last 3 Encounters:  06/12/19 120/80  04/02/19 (!) 159/78  03/13/19 (!) 156/87   Wt Readings from Last 3 Encounters:  06/12/19 197 lb 3.2 oz (89.4 kg)  04/04/19 170 lb (77.1 kg)  03/30/19 170 lb (77.1 kg)    Physical Exam Constitutional:      General: She is not in acute distress.    Appearance: She is not diaphoretic.  HENT:      Head: Normocephalic and atraumatic.     Mouth/Throat:     Mouth: Mucous membranes are moist.     Pharynx: Oropharynx is clear.  Eyes:     Conjunctiva/sclera: Conjunctivae normal.     Pupils: Pupils are equal, round, and reactive to light.  Cardiovascular:     Rate and Rhythm: Normal rate and regular rhythm.     Heart sounds: Normal heart sounds.  Pulmonary:     Effort: Pulmonary effort is normal.     Breath sounds: Normal breath sounds.  Musculoskeletal:     Right lower leg: No edema.     Left lower leg: No edema.     Comments: Bilateral shoulders are nontender, they are symmetric, full range of motion actively and passively bilateral shoulders, patient has discomfort on active and passive range of motion with abduction, internal and external rotation of the right shoulder, she has active range of motion discomfort in the left shoulder through all ranges of motion, she has passive discomfort in the left shoulder on abduction, positive empty can bilaterally, negative speeds, negative drop arm testing  Skin:    General: Skin is warm and dry.     Comments: Scattered mild erythematous rash over her bilateral shins that blanches  Neurological:  Mental Status: She is alert.     Comments: CN 2-12 intact, 5/5 strength in bilateral biceps, triceps, grip, quads, hamstrings, plantar and dorsiflexion, sensation to light touch intact in bilateral UE and LE, normal gait, negative Romberg, no pronator drift, normal finger-to-nose    EKG: Normal sinus rhythm, rate 60, no ST or T wave changes  Assessment/Plan: Please see individual problem list.  Upper GI bleed No evidence of recurrence.  Will obtain a abdominal plain film to evaluate for retention of the capsule.  Discussed preference for 2 view film though the patient declines going to the hospital to have her imaging completed.  We will complete this in the office.  Adrenal adenoma, left Repeat imaging ordered.  Rotator cuff impingement  syndrome I suspect her bilateral shoulder discomfort is related to rotator cuff impingement or tendinitis.  I advised that treatment typically includes a steroid injection and physical therapy.  We are unable to do steroids given her allergy to prednisone.  She declines physical therapy.  She will try exercises at home.  If she has excessive pain with the exercises she will let us know.  DOE (dyspnea on exertion) Undetermined cause of dyspnea.  This could represent deconditioning.  EKG is reassuring.  We will check lab work to evaluate for anemia and other causes.  Discussed that she will likely need to see cardiology.  Unsteadiness on feet Patient reports unsteadiness on her feet.  She has a benign neurological exam.  I wonder if this could be related to her orthopedic issue in her hip or deconditioning or that the likely neuropathy in her feet.  We will complete lab work to evaluate for any electrolyte abnormalities.  She will monitor.  Rash This could represent venous stasis dermatitis given her mild prior swelling in her legs.  Will check platelets and other labs.  Neuropathy Tingling in her feet could be related to neuropathy.  We will check a B12 and an A1c.    Orders Placed This Encounter  Procedures  . DG Abd 1 View    Standing Status:   Future    Number of Occurrences:   1    Standing Expiration Date:   08/11/2020    Order Specific Question:   Reason for Exam (SYMPTOM  OR DIAGNOSIS REQUIRED)    Answer:   follow-up evaluation for capsule endoscopy    Order Specific Question:   Preferred imaging location?    Answer:   [facility] Specific Question:   Radiology Contrast Protocol - do NOT remove file path    Answer:   \\[email or web address]\DXFluoroContrastProtocols.pdf  . CT ABDOMEN WO CONTRAST    Standing Status:   Future    Standing Expiration Date:   09/10/2020    Order Specific Question:   Preferred imaging location?    Answer:   [facility]  [place]    Order Specific Question:   Is Oral Contrast requested for this exam?    Answer:   Yes, Per Radiology protocol    Order Specific Question:   Radiology Contrast Protocol - do NOT remove file path    Answer:   \\[email or web address]\[email or web address]\Radiant\CTProtocols.pdf  . Comp Met (CMET)  . B12  . CBC w/Diff  . TSH  . B Nat Peptide  . CK (Creatine Kinase)  . Sedimentation rate  . HgB A1c  . EKG 12-Lead    No orders of the defined types were placed in this encounter.    [name], MD [facility] -  [employer]

## 2019-06-12 NOTE — Assessment & Plan Note (Signed)
This could represent venous stasis dermatitis given her mild prior swelling in her legs.  Will check platelets and other labs.

## 2019-06-12 NOTE — Telephone Encounter (Signed)
Caller name: Tiffany  Relation to pt: PT from [facility]  Call back number: [phone]    Reason for call:  Verbal orders for PT 1x 1 2x 6 1x 2

## 2019-06-12 NOTE — Telephone Encounter (Signed)
Verbal orders can be given though the patient noted today during her visit that she would likely not be proceeding with PT.

## 2019-06-12 NOTE — Assessment & Plan Note (Signed)
Tingling in her feet could be related to neuropathy.  We will check a B12 and an A1c.

## 2019-06-12 NOTE — Telephone Encounter (Signed)
Tiffany has been informed.

## 2019-06-12 NOTE — Patient Instructions (Signed)
Nice to see you. We are going to get lab work today and contact you with the results. We will get an x-ray. We are going to get a CT scan scheduled to evaluate your adrenal gland nodule. Please complete the exercises for your shoulders.   Shoulder Impingement Syndrome Rehab Ask your health care provider which exercises are safe for you. Do exercises exactly as told by your health care provider and adjust them as directed. It is normal to feel mild stretching, pulling, tightness, or discomfort as you do these exercises. Stop right away if you feel sudden pain or your pain gets worse. Do not begin these exercises until told by your health care provider. Stretching and range-of-motion exercise This exercise warms up your muscles and joints and improves the movement and flexibility of your shoulder. This exercise also helps to relieve pain and stiffness. Passive horizontal adduction In passive adduction, you use your other hand to move the injured arm toward your body. The injured arm does not move on its own. In this movement, your arm is moved across your body in the horizontal plane (horizontal adduction). 1. Sit or stand and pull your left / right elbow across your chest, toward your other shoulder. Stop when you feel a gentle stretch in the back of your shoulder and upper arm. ? Keep your arm at shoulder height. ? Keep your arm as close to your body as you comfortably can. 2. Hold for __________ seconds. 3. Slowly return to the starting position. Repeat __________ times. Complete this exercise __________ times a day. Strengthening exercises These exercises build strength and endurance in your shoulder. Endurance is the ability to use your muscles for a long time, even after they get tired. External rotation, isometric This is an exercise in which you press the back of your wrist against a door frame without moving your shoulder joint (isometric). 1. Stand or sit in a doorway, facing the door  frame. 2. Bend your left / right elbow and place the back of your wrist against the door frame. Only the back of your wrist should be touching the frame. Keep your upper arm at your side. 3. Gently press your wrist against the door frame, as if you are trying to push your arm away from your abdomen (external rotation). Press as hard as you are able without pain. ? Avoid shrugging your shoulder while you press your wrist against the door frame. Keep your shoulder blade tucked down toward the middle of your back. 4. Hold for __________ seconds. 5. Slowly release the tension, and relax your muscles completely before you repeat the exercise. Repeat __________ times. Complete this exercise __________ times a day. Internal rotation, isometric This is an exercise in which you press your palm against a door frame without moving your shoulder joint (isometric). 1. Stand or sit in a doorway, facing the door frame. 2. Bend your left / right elbow and place the palm of your hand against the door frame. Only your palm should be touching the frame. Keep your upper arm at your side. 3. Gently press your hand against the door frame, as if you are trying to push your arm toward your abdomen (internal rotation). Press as hard as you are able without pain. ? Avoid shrugging your shoulder while you press your hand against the door frame. Keep your shoulder blade tucked down toward the middle of your back. 4. Hold for __________ seconds. 5. Slowly release the tension, and relax your muscles completely before  you repeat the exercise. Repeat __________ times. Complete this exercise __________ times a day. Scapular protraction, supine  1. Lie on your back on a firm surface (supine position). Hold a __________ weight in your left / right hand. 2. Raise your left / right arm straight into the air so your hand is directly above your shoulder joint. 3. Push the weight into the air so your shoulder (scapula) lifts off the  surface that you are lying on. The scapula will push up or forward (protraction). Do not move your head, neck, or back. 4. Hold for __________ seconds. 5. Slowly return to the starting position. Let your muscles relax completely before you repeat this exercise. Repeat __________ times. Complete this exercise __________ times a day. Scapular retraction  1. Sit in a stable chair without armrests, or stand up. 2. Secure an exercise band to a stable object in front of you so the band is at shoulder height. 3. Hold one end of the exercise band in each hand. Your palms should face down. 4. Squeeze your shoulder blades together (retraction) and move your elbows slightly behind you. Do not shrug your shoulders upward while you do this. 5. Hold for __________ seconds. 6. Slowly return to the starting position. Repeat __________ times. Complete this exercise __________ times a day. Shoulder extension  1. Sit in a stable chair without armrests, or stand up. 2. Secure an exercise band to a stable object in front of you so the band is above shoulder height. 3. Hold one end of the exercise band in each hand. 4. Straighten your elbows and lift your hands up to shoulder height. 5. Squeeze your shoulder blades together and pull your hands down to the sides of your thighs (extension). Stop when your hands are straight down by your sides. Do not let your hands go behind your body. 6. Hold for __________ seconds. 7. Slowly return to the starting position. Repeat __________ times. Complete this exercise __________ times a day. This information is not intended to replace advice given to you by your health care provider. Make sure you discuss any questions you have with your health care provider. Document Released: 08/31/2005 Document Revised: 12/23/2018 Document Reviewed: 09/26/2018 Elsevier Patient Education  2020 [employer].

## 2019-06-13 ENCOUNTER — Telehealth: Payer: Self-pay

## 2019-06-13 ENCOUNTER — Other Ambulatory Visit: Payer: Self-pay | Admitting: Family Medicine

## 2019-06-13 NOTE — Telephone Encounter (Signed)
Copied from [place] [id number]. Topic: Compliment - Staff >> Jun 13, 2019  3:18 PM Reyne Dumas L wrote: Date of encounter: 06/12/2019 Details of compliment: Dr Caryl Bis and nurse Who would the patient like to thank/see rewarded? Thank you passed along to both Dr. Caryl Bis and nurse for the amount of time they took with her at office visit making her feel safe and heard. On a scale of 1-10, how was your experience? 10  Route to Engineer, building services.

## 2019-06-13 NOTE — Telephone Encounter (Signed)
Noted and appreciated

## 2019-06-14 ENCOUNTER — Telehealth: Payer: Self-pay | Admitting: *Deleted

## 2019-06-14 NOTE — Telephone Encounter (Signed)
Copied from [place] [phone]. Topic: General - Inquiry >> Jun 14, 2019 11:42 AM Toni Berger, NT wrote: Reason for CRM: Colletta [place] called in stating to make PCP aware that patient is refusing PT visits. Start of care visits were done, however she does not want anymore. Please advise and call back is [phone].

## 2019-06-14 NOTE — Telephone Encounter (Signed)
Noted  

## 2019-06-15 ENCOUNTER — Telehealth: Payer: Self-pay

## 2019-06-23 ENCOUNTER — Telehealth: Payer: Self-pay

## 2019-06-23 NOTE — Telephone Encounter (Signed)
Copied from [place] [id number]. Topic: General - Other >> Jun 23, 2019 10:02 AM Rainey Pines A wrote: Patient would like a callback from Toni Berger in regards to cancelling her CT scan

## 2019-06-23 NOTE — Telephone Encounter (Signed)
Patient cancelled the CT scan and will rescheduled it for a later date and time, patient states she has been through a lot and just does not want it right now.  Toni Berger,cma

## 2019-06-25 NOTE — Telephone Encounter (Signed)
Noted.  Please follow-up with her in 1 to 2 weeks to see if she is going to reschedule this.  Thanks.

## 2019-06-26 ENCOUNTER — Other Ambulatory Visit: Payer: Self-pay | Admitting: Family Medicine

## 2019-06-27 ENCOUNTER — Ambulatory Visit: Payer: Medicare Other | Admitting: Family Medicine

## 2019-06-27 ENCOUNTER — Telehealth: Payer: Self-pay

## 2019-06-27 MED ORDER — TRAMADOL HCL 50 MG PO TABS: 50.0000 mg | ORAL_TABLET | Freq: Two times a day (BID) | ORAL | 0 refills | Status: DC | PRN

## 2019-06-27 NOTE — Telephone Encounter (Signed)
I have sent a refill in for 30 days worth.  In general I do not provide refills of tramadol and she will need to continue to call us or the pharmacy for refills monthly.

## 2019-06-27 NOTE — Telephone Encounter (Signed)
Copied from [place] [phone]. Topic: General - Other >> Jun 27, 2019  2:16 PM Leward Quan A wrote: Reason for CRM: Patient called to ask Dr Caryl Bis if he can please send the refill in to the pharmacy for her traMADol (ULTRAM) 50 MG tablet states that she take it every 12 hrs and 30 tabs are not enough so she run out. Also asking Dr to put extra refills on the Rx so she does not have to keep calling the office after contacting the pharmacy. Please advise

## 2019-06-28 NOTE — Telephone Encounter (Signed)
Done. Toni Berger,cma  

## 2019-06-28 NOTE — Telephone Encounter (Signed)
Patient would like to know PCP thoughts regarding the contrast that she has to drink prior to the CT scan. Patient would like to know if contrast will make her nausea or will she experiencing diarrhea .

## 2019-06-28 NOTE — Telephone Encounter (Signed)
Pt called and said her phone rang once and they hung up. Advised pt only message I see is that dr sent in her tramadol for 30 days.  She was glad about that.

## 2019-06-28 NOTE — Telephone Encounter (Signed)
Yes it was me, nina, I called to let her know about the tramadol nad the provider needed me I never heard the phone ring on my end so I hung up to call the patient back, but that is what I wanted her to know.  Nina,cma

## 2019-06-29 ENCOUNTER — Ambulatory Visit: Admission: RE | Admit: 2019-06-29 | Payer: Medicare Other | Source: Ambulatory Visit

## 2019-06-29 NOTE — Telephone Encounter (Signed)
Patient would like to know PCP thoughts regarding the contrast that she has to drink prior to the CT scan. Patient would like to know if contrast will make her nausea or will she experiencing diarrhea .  Toni Berger,cma

## 2019-06-29 NOTE — Telephone Encounter (Signed)
There is the potential that she could have GI side effects related to the contrast.  Most people that drink the contrast do completely fine with it.

## 2019-06-30 NOTE — Telephone Encounter (Signed)
Patient did not answer/ no voicemail.  [place] for [employer] to advise. She called and asked about the contrast for a CT scan if she would have nausea or diarrhea and the provider stated that she could have some GI side effects but most people that drink the contrast do fine with it.  Tatia Petrucci,cma

## 2019-07-19 ENCOUNTER — Telehealth: Payer: Self-pay | Admitting: Family Medicine

## 2019-07-19 NOTE — Telephone Encounter (Signed)
Patient has requested antheer callback from nurse in regards to her hip pain. Patient stated that she will be taking another tramadol at 3:30 today due to her pain and cant seem to get any relief from it.  Patient emphasized that this will be her "3rd" pill she has taken in the 24 hr period. Please advise.

## 2019-07-19 NOTE — Telephone Encounter (Signed)
°  Relation to pt: self  Call back number: [phone]    Reason for call:  Patient states hip pain is not improving and traMADol (ULTRAM) 50 MG tablet is not working, patient seeking clinical advice and would like to speak with "Gae Bon" or PCP nurse, please advise

## 2019-07-19 NOTE — Telephone Encounter (Signed)
Noted. She needs to see an orthopedist for her hip pain. I am at the end of what I can offer her and she needs to see a specialist to help determine the next step in treatment for her hips.

## 2019-07-19 NOTE — Telephone Encounter (Signed)
Patient stated that she will be taking another tramadol at 3:30 today due to her pain and cant seem to get any relief from it.  Patient emphasized that this will be her "3rd" pill she has taken in the 24 hr period. Please advise. Yakir Wenke,cma

## 2019-07-20 ENCOUNTER — Other Ambulatory Visit: Payer: Self-pay | Admitting: Family Medicine

## 2019-07-20 MED ORDER — TRAMADOL HCL 50 MG PO TABS: 50.0000 mg | ORAL_TABLET | Freq: Two times a day (BID) | ORAL | 0 refills | Status: DC | PRN

## 2019-07-20 NOTE — Telephone Encounter (Signed)
Pt is apologizing for anything she said To Dr Chauncey Cruel yesterday, was in so much pain, knows that you are going to be out of town, she is good now but if that happens again when you are gone she may need some more tramadol before then. You will not be there when refill comes due.Feeling much better today!

## 2019-07-20 NOTE — Telephone Encounter (Signed)
Noted. I sent a refill in to be filled on 07/26/19.

## 2019-07-31 ENCOUNTER — Other Ambulatory Visit: Payer: Self-pay

## 2019-07-31 ENCOUNTER — Encounter: Payer: Self-pay | Admitting: Podiatry

## 2019-07-31 ENCOUNTER — Ambulatory Visit (INDEPENDENT_AMBULATORY_CARE_PROVIDER_SITE_OTHER): Payer: Medicare Other | Admitting: Podiatry

## 2019-07-31 DIAGNOSIS — S91111A Laceration without foreign body of right great toe without damage to nail, initial encounter: Secondary | ICD-10-CM | POA: Diagnosis not present

## 2019-07-31 NOTE — Progress Notes (Addendum)
Complaint:  Visit Type: Patient returns to my office for treatment of the tip of her right big toe.  She says she was cutting her nails and cut her skin and sliced her skin at the tip of the right big toe.  She says she bled uncontrollably  and went through several bandages..  Patient is taking plavix.  She presents to  the office today for an evaluation of this big toe right foot and assess her bleeding condition.  Her injury was over 6 hours ago therefore I did not consider suturing this laceration.   Podiatric Exam: Vascular: dorsalis pedis and posterior tibial pulses are palpable bilateral. Capillary return is immediate. Temperature gradient is WNL. Skin turgor WNL  Sensorium: Normal Semmes Weinstein monofilament test. Normal tactile sensation bilaterally. Nail Exam: Pt has thick disfigured discolored nails with subungual debris noted bilateral entire nail hallux through fifth toenails Ulcer Exam: There is no evidence of ulcer or pre-ulcerative changes or infection. Orthopedic Exam: Muscle tone and strength are WNL. No limitations in general ROM. No crepitus or effusions noted. Foot type and digits show no abnormalities. Bony prominences are unremarkable. Skin:  Porokeratosis sub 2 left. No infection or ulcers.  A 2 to 3 mm transverse laceration is present at the distal aspect of the lateral nail border.  The laceration is only through the skin and not deep through the subcutaneous tissue.  Examination reveals the skin is still attached at the injured site.  There is no active bleeding but there is mild blood noted on her bandage.  Diagnosis:  Laceration right hallux.   Treatment & Plan  ROV.  Examination of the transverse laceration reveals skin attached at the site.  The skin was removed using a #15 blade.  The lacerated site was then cauterized using silver nitrate to prevent further bleeding.  The site was then bandaged with a dry sterile dressing. The dressing applied was similar to dermabond  . Patient was told the bleeding should have stopped with the application of the silver nitrate.  I told her she can soak her foot and even shower but she needs to apply another bandage at the site of the injury.  Patient states there is pain at the site of the injury upon leaving which is probably the results of the cauterization of the lacerated site.  RTC prn.     Gardiner Barefoot DPM

## 2019-08-19 ENCOUNTER — Other Ambulatory Visit: Payer: Self-pay | Admitting: Family Medicine

## 2019-08-20 ENCOUNTER — Other Ambulatory Visit: Payer: Self-pay | Admitting: Family Medicine

## 2019-08-21 NOTE — Telephone Encounter (Signed)
Refilled: 07/20/2019 Last OV: 06/12/2019 Next OV: 09/13/2019

## 2019-08-23 ENCOUNTER — Telehealth: Payer: Self-pay

## 2019-08-23 NOTE — Telephone Encounter (Signed)
Copied from [place] [id number]. Topic: General - Other >> Aug 23, 2019  2:46 PM Toni Berger E wrote: Reason for CRM: Pt  has an appt on 12.30.20 and wants to change it to a virtual appt. Pt would like the nurse to call her to go over the virtual appt and see if it will work on her laptop before the appt date

## 2019-08-24 NOTE — Telephone Encounter (Signed)
I helped the patient by showing her how to do a  virtual on Doxy and it was a success.  She is ready for her appointment on 12/30 with the provider.  Nina,cma

## 2019-09-04 ENCOUNTER — Telehealth: Payer: Self-pay

## 2019-09-04 ENCOUNTER — Ambulatory Visit: Payer: Medicare Other | Admitting: Podiatry

## 2019-09-04 DIAGNOSIS — M79604 Pain in right leg: Secondary | ICD-10-CM

## 2019-09-04 NOTE — Telephone Encounter (Signed)
Pt called back the first and she was advised per Dr. Caryl Bis that she really should be evaluated at the ED because this could be a DVT and they can be very dangerous and could kill her if not treated right away. Pt still refused the ED and just wanted to have an appt scheduled for an [place] but that she could not go today because she does not drive in the dark. I advised pt once again that she really should not wait until tomorrow but if she insisted on waiting that if she developed chest pain or SOBr that she needs to call 911 immediately. Pt gave a verbal understanding.   I spoke with Dr. Caryl Bis verbally again and he stated that he would order the [place] for tomorrow but that if her symptoms worsened or she developed chest pain or shortness of breath she needed to get to the ED right away.   Called pt back and to let her know that Lenna Sciara would be reaching out to her today with an appt for tomorrow. Also let pt know again that she needs to seek medical attention if her symptoms worsen or she develops chest pain or SOBr. Pt gave a verbal understanding.

## 2019-09-04 NOTE — Telephone Encounter (Signed)
Pt called and stated that she is having right leg redness, pain and swelling. She stated that the redness has been there for several weeks but the pain and selling just started about 1 week ago. Pt stated that she was not having any SOBr, and no chest pain. Her pain level is a 9. She stated that she takes tramadol for other things but it is not helping the pain in the leg. I advised pt that she needs to go over to the ED because this could be a DVT that she has. Pt stated that she was not going to the ED and wanted to know what else could be done. She also stated that she would just wait until her appt on Dec. 30th and I explained to her that she could not do that because if it was a DVT it needs to be evaluated immediately.   Went and spoke with Dr. Caryl Bis verbally and he stated that it could be a DVT and that she needs to be evaluated in person. He stated that he recommends that she go the ED but if she still refuses he will order her an [place] to have done at the hospital or she can go to [facility] cause they can do an [place] there.   Called pt back and left a message to [place] back.

## 2019-09-04 NOTE — Telephone Encounter (Signed)
Spoke with Janett Billow.  She has further documentation to complete.  Patient has refused ED evaluation.  I will order a stat ultrasound.  Patient advised Janett Billow she could not go today to have it done and will try to get it done tomorrow.  The patient is to be advised that she needs to seek medical attention in the ED with worsening pain, chest pain, or shortness of breath.

## 2019-09-05 ENCOUNTER — Telehealth: Payer: Self-pay

## 2019-09-05 ENCOUNTER — Ambulatory Visit
Admission: RE | Admit: 2019-09-05 | Discharge: 2019-09-05 | Disposition: A | Discharge status: Home / Self Care | Payer: Medicare Other | Source: Ambulatory Visit | Attending: Family Medicine | Admitting: Family Medicine

## 2019-09-05 ENCOUNTER — Other Ambulatory Visit: Payer: Self-pay

## 2019-09-05 DIAGNOSIS — M79604 Pain in right leg: Secondary | ICD-10-CM | POA: Diagnosis not present

## 2019-09-05 NOTE — Telephone Encounter (Signed)
Please let the patient know that her ultrasound was negative for DVT.  Please see if she continues to have symptoms.  If she does she does need to be seen in person to evaluate for a potential cause.  Thanks.

## 2019-09-06 NOTE — Telephone Encounter (Signed)
It can be in person as long as she can pass the screening.

## 2019-09-06 NOTE — Telephone Encounter (Signed)
I called and gave the patient her U/S results and she stated she is still having pain but she has a virtual visit with the provider on 09/13/2019, do you want that one to be an inperson visit because you have no more openings or do you want to leave the visit a virtual?  Arriyana Rodell,cma

## 2019-09-09 ENCOUNTER — Encounter: Payer: Self-pay | Admitting: Family Medicine

## 2019-09-11 ENCOUNTER — Ambulatory Visit: Payer: Medicare Other | Admitting: Podiatry

## 2019-09-11 NOTE — Telephone Encounter (Signed)
I spoke with pt her appt is virtual per pt.

## 2019-09-13 ENCOUNTER — Ambulatory Visit: Payer: Medicare Other | Admitting: Family Medicine

## 2019-09-14 ENCOUNTER — Encounter: Payer: Self-pay | Admitting: Family Medicine

## 2019-09-14 ENCOUNTER — Other Ambulatory Visit: Payer: Self-pay

## 2019-09-14 ENCOUNTER — Ambulatory Visit (INDEPENDENT_AMBULATORY_CARE_PROVIDER_SITE_OTHER): Payer: Medicare Other | Admitting: Family Medicine

## 2019-09-14 DIAGNOSIS — E538 Deficiency of other specified B group vitamins: Secondary | ICD-10-CM | POA: Diagnosis not present

## 2019-09-14 DIAGNOSIS — M7989 Other specified soft tissue disorders: Secondary | ICD-10-CM | POA: Insufficient documentation

## 2019-09-14 DIAGNOSIS — N179 Acute kidney failure, unspecified: Secondary | ICD-10-CM | POA: Insufficient documentation

## 2019-09-14 NOTE — Assessment & Plan Note (Signed)
Noted on prior lab work.  She needs to have this rechecked though she wants to defer this for at least a month given the COVID-19 pandemic.  She will be scheduled for labs.

## 2019-09-14 NOTE — Progress Notes (Signed)
Virtual Visit via video Note  This visit type was conducted due to national recommendations for restrictions regarding the COVID-19 pandemic (e.g. social distancing).  This format is felt to be most appropriate for this patient at this time.  All issues noted in this document were discussed and addressed.  No physical exam was performed (except for noted visual exam findings with Video Visits).   I connected with Toni Berger today at 10:00 AM EST by a video enabled telemedicine application and verified that I am speaking with the correct person using two identifiers. Location patient: home Location provider: work Persons participating in the virtual visit: patient, provider  I discussed the limitations, risks, security and privacy concerns of performing an evaluation and management service by telephone and the availability of in person appointments. I also discussed with the patient that there may be a patient responsible charge related to this service. The patient expressed understanding and agreed to proceed.  Reason for visit: same day visit  HPI: Right leg swelling: Patient notes this started several weeks ago.  She notes the swelling has improved significantly at this time.  No chest pain or shortness of breath.  No orthopnea or PND.  No known cause.  She underwent ultrasound which ruled out DVT.  She does note a reaction to the ultrasound gel which caused itching and a sensation of her leg being on fire.  It then became flaky.  Patient had prior lab work that evaluate her thyroid function, kidney function, liver function, and protein levels as well as a BMP.  Kidney function was minimally worse than prior though otherwise no potential causes noted on lab work.  She does take amlodipine.  B12 deficiency: She has been taking oral B12.  She is due for recheck.  AKI: Noted on prior lab work.  She denies taking any NSAIDs.  Due for recheck.   ROS: See pertinent positives and negatives per  HPI.  Past Medical History:  Diagnosis Date  . Allergy   . Anxiety   . Arthritis   . Fibrocystic breast   . Hyperlipidemia   . Hypertension   . Lipoma    5/01 Abdominal wall lipoma  . Loss of consciousness ([place]) 12/15/2015  . Migraines   . Osteoporosis, unspecified   . Restless leg syndrome   . Stroke (cerebrum) [facility])     Past Surgical History:  Procedure Laterality Date  . APPENDECTOMY  1999  . BREAST CYST ASPIRATION    . CHOLECYSTECTOMY  1999  . DOBUTAMINE STRESS ECHO  06/96   negative  . ESOPHAGOGASTRODUODENOSCOPY N/A 03/30/2019   Procedure: ESOPHAGOGASTRODUODENOSCOPY (EGD);  Surgeon: Lin Landsman, MD;  Location: [facility] ENDOSCOPY;  Service: Gastroenterology;  Laterality: N/A;  . GIVENS CAPSULE STUDY N/A 03/31/2019   Procedure: GIVENS CAPSULE STUDY;  Surgeon: Lin Landsman, MD;  Location: [facility] ENDOSCOPY;  Service: Gastroenterology;  Laterality: N/A;  . TONSILLECTOMY AND ADENOIDECTOMY  1974  . TOTAL ABDOMINAL HYSTERECTOMY W/ BILATERAL SALPINGOOPHORECTOMY  1981    Family History  Problem Relation Age of Onset  . Heart disease Mother   . Diabetes Mother   . Aneurysm Mother        AAA  . Heart failure Mother   . Heart failure Father   . Coronary artery disease Father   . Colon cancer Neg Hx   . Breast cancer Neg Hx     SOCIAL HX: Former smoker   Current Outpatient Medications:  .  amLODipine (NORVASC) 5 MG tablet, TAKE  1 TABLET DAILY, Disp: 90 tablet, Rfl: 3 .  clonazePAM (KLONOPIN) 1 MG tablet, TAKE 1 TABLET BY MOUTH EVERY 8 TO 12 HOURS AS NEEDED FOR ANXIETY (MAX 3 DOSES IN 24 HOURS), Disp: 90 tablet, Rfl: 0 .  clopidogrel (PLAVIX) 75 MG tablet, Take 75 mg by mouth daily., Disp: , Rfl:  .  cyanocobalamin 1000 MCG tablet, Take 1,000 mcg by mouth daily., Disp: , Rfl:  .  lisinopril (ZESTRIL) 40 MG tablet, TAKE 1 TABLET DAILY, Disp: 90 tablet, Rfl: 3 .  traMADol (ULTRAM) 50 MG tablet, TAKE 1 TABLET (50 MG TOTAL) BY MOUTH EVERY 12 (TWELVE) HOURS AS NEEDED.,  Disp: 60 tablet, Rfl: 0  EXAM:  VITALS per patient if applicable:  GENERAL: alert, oriented, appears well and in no acute distress  HEENT: atraumatic, conjunttiva clear, no obvious abnormalities on inspection of external nose and ears  NECK: normal movements of the head and neck  LUNGS: on inspection no signs of respiratory distress, breathing rate appears normal, no obvious gross SOB, gasping or wheezing  CV: no obvious cyanosis  MS: moves all visible extremities without noticeable abnormality  PSYCH/NEURO: pleasant and cooperative, no obvious depression or anxiety, speech and thought processing grossly intact  ASSESSMENT AND PLAN:  Discussed the following assessment and plan:  Right leg swelling This is improved significantly.  DVT ruled out.  Discussed possibly being related to venous insufficiency or her amlodipine.  She will monitor for recurrence.  Ultrasound gel added to allergy list.  B12 deficiency Continue oral supplement.  Patient has declined injectable B12.  Recheck with next set of labs.  AKI (acute kidney injury) ([place]) Noted on prior lab work.  She needs to have this rechecked though she wants to defer this for at least a month given the COVID-19 pandemic.  She will be scheduled for labs.   Orders Placed This Encounter  Procedures  . Basic Metabolic Panel (BMET)    Standing Status:   Future    Standing Expiration Date:   09/13/2020  . B12    Standing Status:   Future    Standing Expiration Date:   09/13/2020    No orders of the defined types were placed in this encounter.    I discussed the assessment and treatment plan with the patient. The patient was provided an opportunity to ask questions and all were answered. The patient agreed with the plan and demonstrated an understanding of the instructions.   The patient was advised to call back or seek an in-person evaluation if the symptoms worsen or if the condition fails to improve as  anticipated.   [name], MD

## 2019-09-14 NOTE — Assessment & Plan Note (Signed)
Continue oral supplement.  Patient has declined injectable B12.  Recheck with next set of labs.

## 2019-09-14 NOTE — Assessment & Plan Note (Addendum)
This is improved significantly.  DVT ruled out.  Discussed possibly being related to venous insufficiency or her amlodipine.  She will monitor for recurrence.  Ultrasound gel added to allergy list.

## 2019-09-15 ENCOUNTER — Encounter: Payer: Self-pay | Admitting: Family Medicine

## 2019-09-17 ENCOUNTER — Other Ambulatory Visit: Payer: Self-pay | Admitting: Family Medicine

## 2019-09-18 ENCOUNTER — Telehealth: Payer: Self-pay | Admitting: Family Medicine

## 2019-09-18 NOTE — Telephone Encounter (Signed)
Patient went out to get her medication and wearing her mask and driving made her very anxious, and feeling like she is coupe up and feeling isolated wanted to know if other people feel this way advised that a hobby of some kind might help or reading a book.

## 2019-10-05 ENCOUNTER — Other Ambulatory Visit: Payer: Self-pay | Admitting: Family Medicine

## 2019-10-09 ENCOUNTER — Telehealth: Payer: Self-pay | Admitting: Family Medicine

## 2019-10-09 ENCOUNTER — Ambulatory Visit: Payer: Medicare Other | Admitting: Podiatry

## 2019-10-09 ENCOUNTER — Telehealth: Payer: Self-pay

## 2019-10-09 NOTE — Telephone Encounter (Signed)
Patient stated pharmacy stated rx needs to be placed in epic again transmission has failed.

## 2019-10-09 NOTE — Telephone Encounter (Signed)
Pt had rice and salmon. Pt laid down and went to sleep and she woke up coughing and it burned coming up like she had to throw up. She said it helped to sit up higher with the pillows. Not sure what caused it. She she took Tums. It is better but she wanted Dr. Caryl Bis to know. She said if it wakes her up tonight she will call us or send Dr. Caryl Bis a message.

## 2019-10-09 NOTE — Telephone Encounter (Signed)
Fransisco Beau,  im covering for [employer]. What medication are you talking about? I cant see which one she needs

## 2019-10-10 ENCOUNTER — Other Ambulatory Visit: Payer: Self-pay

## 2019-10-10 ENCOUNTER — Other Ambulatory Visit: Payer: Self-pay | Admitting: Family Medicine

## 2019-10-10 MED ORDER — CLOPIDOGREL BISULFATE 75 MG PO TABS: 75.0000 mg | ORAL_TABLET | Freq: Every day | ORAL | 3 refills | Status: DC

## 2019-10-10 NOTE — Telephone Encounter (Signed)
From patient's chart it appears that the Plavix transmission failed. [facility].

## 2019-10-11 ENCOUNTER — Other Ambulatory Visit: Payer: Self-pay

## 2019-10-11 MED ORDER — CLOPIDOGREL BISULFATE 75 MG PO TABS: 75.0000 mg | ORAL_TABLET | Freq: Every day | ORAL | 3 refills | Status: DC

## 2019-10-11 NOTE — Progress Notes (Signed)
Medication [facility] because it was sent to the wrong pharmacy. I also cancelled the Rx [facility] at [facility] on [address].  Stevie Charter,cma

## 2019-10-11 NOTE — Telephone Encounter (Signed)
Pt needs clopidogrel (PLAVIX) 75 MG tablet [facility] fax number [phone]

## 2019-10-11 NOTE — Telephone Encounter (Signed)
She just had these refilled on 09/18/19.  She should not ned refill yet.  Will need to get refill closer to time due.  Let us know if a problem.

## 2019-10-13 ENCOUNTER — Other Ambulatory Visit: Payer: Self-pay | Admitting: Family Medicine

## 2019-10-13 ENCOUNTER — Telehealth: Payer: Self-pay | Admitting: Family Medicine

## 2019-10-13 NOTE — Telephone Encounter (Signed)
Pt said for all future prescriptions she just [facility] [address]. She said that everything with the mail order isn't working correctly. She also said that [facility] for clonazepam and tramadol she said to please send it back. Aurel Nguyen,cma

## 2019-10-13 NOTE — Telephone Encounter (Signed)
Pt said for all future prescriptions she [facility] [address] She said that everything with the mail order isn't working correctly. She also said that [facility] for clonazepam and tramadol she said to please send it back. I might be unclear on everything she needed if you want to give her a call back.

## 2019-10-14 ENCOUNTER — Other Ambulatory Visit: Payer: Self-pay | Admitting: Family Medicine

## 2019-10-15 NOTE — Telephone Encounter (Signed)
Please make sure this is updated in her chart. Thanks.

## 2019-11-10 ENCOUNTER — Other Ambulatory Visit: Payer: Self-pay | Admitting: Family Medicine

## 2019-11-21 ENCOUNTER — Encounter: Payer: Self-pay | Admitting: Family Medicine

## 2019-11-21 NOTE — Telephone Encounter (Signed)
Pt called to make sure mychart message came through

## 2019-11-22 ENCOUNTER — Encounter: Payer: Self-pay | Admitting: Family Medicine

## 2019-11-22 NOTE — Telephone Encounter (Signed)
See other mychart message.

## 2019-11-23 ENCOUNTER — Encounter: Payer: Self-pay | Admitting: Podiatry

## 2019-11-23 ENCOUNTER — Ambulatory Visit (INDEPENDENT_AMBULATORY_CARE_PROVIDER_SITE_OTHER): Payer: Medicare Other | Admitting: Podiatry

## 2019-11-23 ENCOUNTER — Other Ambulatory Visit: Payer: Self-pay

## 2019-11-23 VITALS — Temp 96.3°F

## 2019-11-23 DIAGNOSIS — B351 Tinea unguium: Secondary | ICD-10-CM | POA: Diagnosis not present

## 2019-11-23 DIAGNOSIS — Q828 Other specified congenital malformations of skin: Secondary | ICD-10-CM

## 2019-11-23 DIAGNOSIS — D689 Coagulation defect, unspecified: Secondary | ICD-10-CM | POA: Diagnosis not present

## 2019-11-23 DIAGNOSIS — M79676 Pain in unspecified toe(s): Secondary | ICD-10-CM

## 2019-11-23 NOTE — Progress Notes (Signed)
This patient returns to my office for at risk foot care.  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 Patient is taking plavix.  This patient is unable to cut nails herself  since the patient cannot reach her nails .These nails are painful walking and wearing shoes.  This patient presents for at risk foot care today.  Patient has painful callus left forfoot.  General Appearance  Alert, conversant and in no acute stress.  Vascular  Dorsalis pedis and posterior tibial  pulses are palpable  bilaterally.  Capillary return is within normal limits  bilaterally. Temperature is within normal limits  bilaterally.  Neurologic  Senn-Weinstein monofilament wire test within normal limits  bilaterally. Muscle power within normal limits bilaterally.  Nails Thick disfigured discolored nails with subungual debris  from hallux to fifth toes bilaterally. No evidence of bacterial infection or drainage bilaterally.  Orthopedic  No limitations of motion  feet .  No crepitus or effusions noted.  No bony pathology or digital deformities noted.  Skin  normotropic skin with no porokeratosis noted bilaterally.  No signs of infections or ulcers noted.   Callus sub 2 left foot.  Onychomycosis  Pain in right toes  Pain in left toes  Consent was obtained for treatment procedures.   Mechanical debridement of nails 1-5  bilaterally performed with a nail nipper.  Filed with dremel without incident. No infection or ulcer.  Debride callus with # 15  blade.   Return office visit  3 months         Told patient to return for periodic foot care and evaluation due to potential at risk complications.   Gardiner Barefoot DPM

## 2019-12-03 ENCOUNTER — Encounter: Payer: Self-pay | Admitting: Family Medicine

## 2019-12-04 ENCOUNTER — Encounter: Payer: Self-pay | Admitting: Family Medicine

## 2019-12-04 ENCOUNTER — Telehealth: Payer: Self-pay | Admitting: Family Medicine

## 2019-12-04 NOTE — Telephone Encounter (Signed)
Pt called back and scheduled an appt for 12/13/19  Pt wanted to know if this was ok or if she needed to be seen sooner

## 2019-12-04 NOTE — Telephone Encounter (Signed)
Pt is following up on myChart message that was sent. I let her know that we haven't got to messages but he will see it. She then wanted to know if she maybe could get a shower chair? Maybe where she could get it? Please advise.

## 2019-12-05 NOTE — Telephone Encounter (Signed)
Pt called in and cancelled appt. I asked why she wanted to cancel and she said she didn't want to get it into it. I asked her if everything was alright she said she just don't want to come out. I said Dr. Caryl Bis could do a telephone visit to discuss what is going on and she declined. She said she hope Dr. Caryl Bis would still be her doctor but she just can't get into right now.

## 2019-12-09 ENCOUNTER — Other Ambulatory Visit: Payer: Self-pay | Admitting: Family Medicine

## 2019-12-11 NOTE — Telephone Encounter (Signed)
Refill request for Klonopin and tramadol, last seen 09-14-19, last filled 11-12-19.  Please advise.

## 2019-12-13 ENCOUNTER — Ambulatory Visit: Payer: Medicare Other | Admitting: Family Medicine

## 2020-01-05 ENCOUNTER — Other Ambulatory Visit: Payer: Self-pay | Admitting: Family Medicine

## 2020-01-05 NOTE — Telephone Encounter (Signed)
Sent to pharmacy to be filled on 4/29 as that is when her refills are due.

## 2020-01-05 NOTE — Telephone Encounter (Signed)
She needs follow-up scheduled for these to be refilled. Once scheduled I will refill with enough to cover until she is seen.

## 2020-01-19 ENCOUNTER — Other Ambulatory Visit: Payer: Self-pay

## 2020-01-19 ENCOUNTER — Ambulatory Visit: Payer: Medicare Other | Admitting: Family Medicine

## 2020-01-19 ENCOUNTER — Ambulatory Visit (INDEPENDENT_AMBULATORY_CARE_PROVIDER_SITE_OTHER): Payer: Medicare Other | Admitting: Family Medicine

## 2020-01-19 ENCOUNTER — Encounter: Payer: Self-pay | Admitting: Family Medicine

## 2020-01-19 VITALS — BP 110/80 | HR 81 | Temp 97.0°F | Ht 68.0 in | Wt 206.8 lb

## 2020-01-19 DIAGNOSIS — R7303 Prediabetes: Secondary | ICD-10-CM | POA: Diagnosis not present

## 2020-01-19 DIAGNOSIS — K2100 Gastro-esophageal reflux disease with esophagitis, without bleeding: Secondary | ICD-10-CM | POA: Diagnosis not present

## 2020-01-19 DIAGNOSIS — E538 Deficiency of other specified B group vitamins: Secondary | ICD-10-CM | POA: Diagnosis not present

## 2020-01-19 DIAGNOSIS — F32A Depression, unspecified: Secondary | ICD-10-CM

## 2020-01-19 DIAGNOSIS — I10 Essential (primary) hypertension: Secondary | ICD-10-CM

## 2020-01-19 DIAGNOSIS — K219 Gastro-esophageal reflux disease without esophagitis: Secondary | ICD-10-CM | POA: Insufficient documentation

## 2020-01-19 DIAGNOSIS — I872 Venous insufficiency (chronic) (peripheral): Secondary | ICD-10-CM

## 2020-01-19 DIAGNOSIS — F419 Anxiety disorder, unspecified: Secondary | ICD-10-CM | POA: Diagnosis not present

## 2020-01-19 DIAGNOSIS — F329 Major depressive disorder, single episode, unspecified: Secondary | ICD-10-CM | POA: Diagnosis not present

## 2020-01-19 DIAGNOSIS — N179 Acute kidney failure, unspecified: Secondary | ICD-10-CM

## 2020-01-19 HISTORY — DX: Venous insufficiency (chronic) (peripheral): I87.2

## 2020-01-19 NOTE — Assessment & Plan Note (Signed)
Advised prescription PPI management and referral to GI though she declines both of these at this time.  She will trial over-the-counter Pepcid.  Discussed the risk of missing a significant lesion in her esophagus or stomach without work-up of this issue.  She understands the risk of this and declines the advised treatment course.

## 2020-01-19 NOTE — Assessment & Plan Note (Signed)
Relatively stable.  Discussed adding in an SSRI to help with her symptoms though she defers this at this time.  She can continue on the clonazepam at this time.

## 2020-01-19 NOTE — Patient Instructions (Signed)
Nice to see you. We will get labs today and contact you with the results. Please try Pepcid over-the-counter for your reflux. If you would like to consider Nexium or Prilosec please let me know. If you change your mind regarding seeing GI, having a colonoscopy, or having a mammogram please let us know.

## 2020-01-19 NOTE — Assessment & Plan Note (Signed)
At goal. Continue current regimen. 

## 2020-01-19 NOTE — Progress Notes (Signed)
[name], MD Phone: [phone]  Toni Berger is a 74 y.o. female who presents today for follow-up.  Anxiety/depression: Patient notes her anxiety is up and down.  Occasionally feels down though notes she does not feel that this would be an uncommon thing with the way the world is now.  No SI.  Taking clonazepam.  It does not make her drowsy.  Does help with her restless legs.  Hypertension: Taking amlodipine and lisinopril.  No chest pain or shortness of breath.  GERD: She takes Tums every time she eats.  She was on Protonix previously though gave her headaches.  No abdominal pain or blood in her stool.  She does report dysphagia at times feeling as though she gets choked up with the eating.  This has been going on for a number of months.  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  She had nonbleeding erosive gastropathy and a hiatal hernia as well as esophagitis.  She declines wanting to go for another EGD.  Venous insufficiency: She continues to have issues with stasis dermatitis.  She notes itching and burning in her lower legs where there is an erythematous rash that has been chronic.  She uses topical lotions on this with some benefit.  Social History   Tobacco Use  Smoking Status Former Smoker  Smokeless Tobacco Never Used     ROS see history of present illness  Objective  Physical Exam Vitals:   01/19/20 1502  BP: 110/80  Pulse: 81  Temp: (!) 97 F (36.1 C)  SpO2: 97%    BP Readings from Last 3 Encounters:  01/19/20 110/80  06/12/19 120/80  04/02/19 (!) 159/78   Wt Readings from Last 3 Encounters:  01/19/20 206 lb 12.8 oz (93.8 kg)  09/14/19 195 lb (88.5 kg)  06/12/19 197 lb 3.2 oz (89.4 kg)    Physical Exam Constitutional:      General: She is not in acute distress.    Appearance: She is not diaphoretic.  Cardiovascular:     Rate and Rhythm: Normal rate and regular rhythm.       Heart sounds: Normal heart sounds.  Pulmonary:     Effort: Pulmonary effort is normal.     Breath sounds: Normal breath sounds.  Abdominal:     General: Bowel sounds are normal. There is no distension.     Palpations: Abdomen is soft.     Tenderness: There is no abdominal tenderness. There is no guarding or rebound.  Musculoskeletal:     Right lower leg: No edema.     Left lower leg: No edema.     Comments: Slight erythema in her bilateral lower legs from her mid shins down, there is no significant warmth or tenderness  Skin:    General: Skin is warm and dry.  Neurological:     Mental Status: She is alert.      Assessment/Plan: Please see individual problem list.  HYPERTENSION, BENIGN At goal.  Continue current regimen.  Anxiety and depression Relatively stable.  Discussed adding in an SSRI to help with her symptoms though she defers this at this time.  She can continue on the clonazepam at this time.  B12 deficiency Check B12.  AKI (acute kidney injury) ([place]) Recheck kidney function.  GERD (gastroesophageal reflux disease) Advised prescription PPI management and referral to GI though she declines both of these at this time.  She will trial  over-the-counter Pepcid.  Discussed the risk of missing a significant lesion in her esophagus or stomach without work-up of this issue.  She understands the risk of this and declines the advised treatment course.  Venous stasis dermatitis Offered topical steroid though she declines.  She can continue her over-the-counter lotion.   Health Maintenance: Patient declines mammogram and colonoscopy.  She understands the risk of missing a significant lesion in her breasts and colon without having these done.  Orders Placed This Encounter  Procedures  . Basic Metabolic Panel (BMET)  . B12  . HgB A1c  . Lipid panel    No orders of the defined types were placed in this encounter.   This visit occurred during the SARS-CoV-2 public  health emergency.  Safety protocols were in place, including screening questions prior to the visit, additional usage of staff PPE, and extensive cleaning of exam room while observing appropriate contact time as indicated for disinfecting solutions.    [name], MD [place]

## 2020-01-19 NOTE — Assessment & Plan Note (Signed)
Check B12 

## 2020-01-19 NOTE — Assessment & Plan Note (Signed)
Offered topical steroid though she declines.  She can continue her over-the-counter lotion.

## 2020-01-19 NOTE — Assessment & Plan Note (Signed)
Recheck kidney function.  

## 2020-01-20 ENCOUNTER — Encounter: Payer: Self-pay | Admitting: Family Medicine

## 2020-01-20 LAB — BASIC METABOLIC PANEL
BUN: 22 mg/dL (ref 7–25)
CO2: 26 mmol/L (ref 20–32)
Calcium: 9.8 mg/dL (ref 8.6–10.4)
Chloride: 102 mmol/L (ref 98–110)
Creat: 0.84 mg/dL (ref 0.60–0.93)
Glucose, Bld: 77 mg/dL (ref 65–99)
Potassium: 4.4 mmol/L (ref 3.5–5.3)
Sodium: 138 mmol/L (ref 135–146)

## 2020-01-20 LAB — VITAMIN B12: Vitamin B-12: 1041 pg/mL (ref 200–1100)

## 2020-01-20 LAB — LIPID PANEL
Cholesterol: 187 mg/dL (ref <200)
HDL: 63 mg/dL (ref >=50)
LDL Cholesterol (Calc): 97 mg/dL (calc)
Non-HDL Cholesterol (Calc): 124 mg/dL (calc) (ref <130)
Total CHOL/HDL Ratio: 3 (calc) (ref <5.0)
Triglycerides: 178 mg/dL — ABNORMAL HIGH (ref <150)

## 2020-01-20 LAB — HEMOGLOBIN A1C
Hgb A1c MFr Bld: 5.6 % of total Hgb (ref <5.7)
Mean Plasma Glucose: 114 (calc)
eAG (mmol/L): 6.3 (calc)

## 2020-01-25 ENCOUNTER — Other Ambulatory Visit: Payer: Self-pay | Admitting: Family Medicine

## 2020-01-25 DIAGNOSIS — Z1231 Encounter for screening mammogram for malignant neoplasm of breast: Secondary | ICD-10-CM

## 2020-02-01 ENCOUNTER — Other Ambulatory Visit: Payer: Self-pay | Admitting: Family Medicine

## 2020-02-01 ENCOUNTER — Encounter: Payer: Self-pay | Admitting: Podiatry

## 2020-02-01 ENCOUNTER — Other Ambulatory Visit: Payer: Self-pay

## 2020-02-01 ENCOUNTER — Ambulatory Visit (INDEPENDENT_AMBULATORY_CARE_PROVIDER_SITE_OTHER): Payer: Medicare Other | Admitting: Podiatry

## 2020-02-01 VITALS — Temp 97.2°F

## 2020-02-01 DIAGNOSIS — Q828 Other specified congenital malformations of skin: Secondary | ICD-10-CM | POA: Diagnosis not present

## 2020-02-01 DIAGNOSIS — B351 Tinea unguium: Secondary | ICD-10-CM

## 2020-02-01 DIAGNOSIS — D689 Coagulation defect, unspecified: Secondary | ICD-10-CM

## 2020-02-01 DIAGNOSIS — M79609 Pain in unspecified limb: Secondary | ICD-10-CM

## 2020-02-01 DIAGNOSIS — M79676 Pain in unspecified toe(s): Secondary | ICD-10-CM

## 2020-02-01 NOTE — Progress Notes (Signed)
This patient returns to my office for at risk foot care.  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  This patient is unable to cut nails herself since the patient cannot reach her nails.These nails are painful walking and wearing shoes.  This patient presents for at risk foot care today.  General Appearance  Alert, conversant and in no acute stress.  Vascular  Dorsalis pedis and posterior tibial  pulses are palpable  bilaterally.  Capillary return is within normal limits  bilaterally. Temperature is within normal limits  bilaterally.  Neurologic  Senn-Weinstein monofilament wire test within normal limits  bilaterally. Muscle power within normal limits bilaterally.  Nails Thick disfigured discolored nails with subungual debris  from hallux to fifth toes bilaterally. No evidence of bacterial infection or drainage bilaterally.  Orthopedic  No limitations of motion  feet .  No crepitus or effusions noted.  No bony pathology or digital deformities noted.  Skin  normotropic skin  noted bilaterally.  No signs of infections or ulcers noted.   Porokeratosis sub 2 left foot.  Onychomycosis  Pain in right toes  Pain in left toes  Porokeratosis  Left forefoot.  Consent was obtained for treatment procedures.   Mechanical debridement of nails 1-5  bilaterally performed with a nail nipper.  Filed with dremel without incident.    Return office visit   3 months                   Told patient to return for periodic foot care and evaluation due to potential at risk complications.   Gardiner Barefoot DPM

## 2020-02-02 ENCOUNTER — Telehealth: Payer: Self-pay | Admitting: Family Medicine

## 2020-02-02 ENCOUNTER — Ambulatory Visit
Admission: RE | Admit: 2020-02-02 | Discharge: 2020-02-02 | Disposition: A | Discharge status: Home / Self Care | Payer: Medicare Other | Source: Ambulatory Visit | Attending: Family Medicine | Admitting: Family Medicine

## 2020-02-02 ENCOUNTER — Encounter: Payer: Self-pay | Admitting: Family Medicine

## 2020-02-02 DIAGNOSIS — Z1231 Encounter for screening mammogram for malignant neoplasm of breast: Secondary | ICD-10-CM | POA: Insufficient documentation

## 2020-02-02 DIAGNOSIS — R928 Other abnormal and inconclusive findings on diagnostic imaging of breast: Secondary | ICD-10-CM

## 2020-02-02 NOTE — Telephone Encounter (Signed)
Pt called in about imaging done today and wanted a call back. I transferred to Gae Bon since she was available.

## 2020-02-02 NOTE — Telephone Encounter (Signed)
Pt called and said that her provider has to do the order that she couldn't do it

## 2020-02-02 NOTE — Telephone Encounter (Signed)
Ordered

## 2020-02-02 NOTE — Telephone Encounter (Signed)
Can you order the  breast diagnostic of the left breast the patient called and tried to schedule.  Tomi Grandpre,cma

## 2020-02-05 ENCOUNTER — Other Ambulatory Visit: Payer: Self-pay | Admitting: Family Medicine

## 2020-02-05 DIAGNOSIS — R921 Mammographic calcification found on diagnostic imaging of breast: Secondary | ICD-10-CM

## 2020-02-05 DIAGNOSIS — R928 Other abnormal and inconclusive findings on diagnostic imaging of breast: Secondary | ICD-10-CM

## 2020-02-14 ENCOUNTER — Other Ambulatory Visit: Payer: Self-pay | Admitting: Family Medicine

## 2020-02-14 ENCOUNTER — Ambulatory Visit
Admission: RE | Admit: 2020-02-14 | Discharge: 2020-02-14 | Disposition: A | Discharge status: Home / Self Care | Payer: Medicare Other | Source: Ambulatory Visit | Attending: Family Medicine | Admitting: Family Medicine

## 2020-02-14 DIAGNOSIS — R928 Other abnormal and inconclusive findings on diagnostic imaging of breast: Secondary | ICD-10-CM

## 2020-02-14 DIAGNOSIS — R921 Mammographic calcification found on diagnostic imaging of breast: Secondary | ICD-10-CM | POA: Diagnosis not present

## 2020-02-21 ENCOUNTER — Telehealth: Payer: Self-pay | Admitting: *Deleted

## 2020-02-21 ENCOUNTER — Encounter: Payer: Self-pay | Admitting: Family Medicine

## 2020-02-21 NOTE — Telephone Encounter (Signed)
Spoke to pt on 6/4 to schedule biopsy.  Pt insists that she needs time to think about having the biopsy and was adamant that we allow her that time, and stated she will contact us directly if she decides to schedule.

## 2020-02-22 NOTE — Telephone Encounter (Signed)
The patient wants the written instructions of how a breast biopsy is performed before she considers having it done. She also stated that she would not come of blood thinner.

## 2020-02-23 NOTE — Telephone Encounter (Signed)
I do not know what the exact steps of the procedure are. She would have to discuss that with the radiologist or the breast center to see if they could provide her with these instructions. She should call the breast center at [phone] to see if they can give her an idea of the steps of the biopsy procedure.

## 2020-02-26 ENCOUNTER — Ambulatory Visit: Payer: Medicare Other | Admitting: Podiatry

## 2020-02-26 ENCOUNTER — Ambulatory Visit (INDEPENDENT_AMBULATORY_CARE_PROVIDER_SITE_OTHER): Payer: Medicare Other | Admitting: Family Medicine

## 2020-02-26 ENCOUNTER — Other Ambulatory Visit: Payer: Self-pay

## 2020-02-26 ENCOUNTER — Telehealth: Payer: Self-pay

## 2020-02-26 ENCOUNTER — Encounter: Payer: Self-pay | Admitting: Family Medicine

## 2020-02-26 VITALS — BP 138/80 | HR 86 | Temp 97.4°F | Ht 68.0 in | Wt 202.6 lb

## 2020-02-26 DIAGNOSIS — R928 Other abnormal and inconclusive findings on diagnostic imaging of breast: Secondary | ICD-10-CM | POA: Diagnosis not present

## 2020-02-26 NOTE — Telephone Encounter (Signed)
Toni Berger called our office on 06/11/20221 at 3:40 and left a message to call back about her biopsy.  We have tried to call her and explain to her the procedure and to get her scheduled and she has told us she will call [facility] when she is ready to schedule.   I called Toni Berger this am at 8:16 and had to leave a message for her to return my call to answer any questions she may have.

## 2020-02-26 NOTE — Assessment & Plan Note (Signed)
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  Discussed that DCIS is relatively common form of breast cancer limited to the ducts in her breast.  Discussed that the biopsy needs to be done to confirm whether or not this is DCIS or another form of breast cancer or some other cause.  Advised that we could not tell her if this was malignant or was not malignant without the biopsy.  I encouraged her to have a biopsy completed.  She is hesitant to go back to the breast center given that per her report they have not been answering her phone calls and have been giving her the run around.  I discussed having her go see a local surgeon who would be able to discuss this with her and discuss her concerns and then consider a biopsy.  She was initially hesitant to do this though by the end of the visit she was willing to at least go see the surgeon.  A referral will be placed to Dr. Bary Castilla.  Message sent to our referral coordinator to get the referral sent over to him.

## 2020-02-26 NOTE — Patient Instructions (Signed)
Nice to see you. We will contact you once I hear back from the surgeon.  Please let us know if you would like to see them to discuss a biopsy.

## 2020-02-26 NOTE — Progress Notes (Signed)
[name], MD Phone: [phone]  Toni Berger is a 74 y.o. female who presents today for f/u.  Abnormal mammogram: Patient underwent screening mammogram which revealed areas of calcification.  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  Biopsy was recommended though the patient is hesitant to do this.  Today she notes she only did the mammogram to try to make me happy.  She notes she has a healthcare directive that indicates that she does not want any surgeries or procedures done.  She notes she really would not want any surgery or radiation or chemotherapy as she does not have anybody at home with her and does not have anybody that could transport her.  She notes she has had a difficult time with the breast center since having the diagnostic mammogram.  She has called multiple times and feels as though she has been given the run around.  She had questions about what the procedure would be like and she could not get answers to that.  She notes that a also hung up on her.  She notes multiple times today that she really just does not know what to do as she does not have anybody at home.  Social History   Tobacco Use  Smoking Status Former Smoker  Smokeless Tobacco Never Used     ROS see history of present illness  Objective  Physical Exam Vitals:   02/26/20 1135  BP: 138/80  Pulse: 86  Temp: (!) 97.4 F (36.3 C)  SpO2: 97%    BP Readings from Last 3 Encounters:  02/26/20 138/80  01/19/20 110/80  06/12/19 120/80   Wt Readings from Last 3 Encounters:  02/26/20 202 lb 9.6 oz (91.9 kg)  01/19/20 206 lb 12.8 oz (93.8 kg)  09/14/19 195 lb (88.5 kg)    Physical Exam Constitutional:      General: She is not in acute distress. Pulmonary:     Effort: Pulmonary effort is normal.  Neurological:     Mental Status: She is alert.      Assessment/Plan: Please see individual problem list.  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  Discussed that DCIS is relatively common form of breast cancer limited to the ducts in her breast.  Discussed that the biopsy needs to be done to confirm whether or not this is DCIS or another form of breast cancer or some other cause.  Advised that we could not tell her if this was malignant or was not malignant without the biopsy.  I encouraged her to have a biopsy completed.  She is hesitant to go back to the breast center given that per her report they have not been answering her phone calls and have been giving her the run around.  I discussed having her go see a local surgeon who would be able to discuss this with her and discuss her concerns and then consider a biopsy.  She was initially hesitant to do this though by the end of the visit she was willing to at least go see the surgeon.  A referral will be placed to Dr. Bary Castilla.  Message sent to our referral coordinator to get the referral sent over to him.   Orders Placed This Encounter  Procedures  . Ambulatory referral to General Surgery    Referral Priority:   Urgent    Referral Type:   Surgical    Referral Reason:  Specialty Services Required    Requested Specialty:   General Surgery    Number of Visits Requested:   1    No orders of the defined types were placed in this encounter.   This visit occurred during the SARS-CoV-2 public health emergency.  Safety protocols were in place, including screening questions prior to the visit, additional usage of staff PPE, and extensive cleaning of exam room while observing appropriate contact time as indicated for disinfecting solutions.   I have spent 32 minutes in the care of this patient regarding discussion of mammogram findings, discussion of patient concerns regarding biopsy and her dealings with the breast center, discussion of the next steps and biopsy procedure.   [name], MD [place]

## 2020-02-29 ENCOUNTER — Other Ambulatory Visit: Payer: Self-pay | Admitting: Family Medicine

## 2020-02-29 DIAGNOSIS — R928 Other abnormal and inconclusive findings on diagnostic imaging of breast: Secondary | ICD-10-CM | POA: Diagnosis not present

## 2020-03-02 ENCOUNTER — Encounter: Payer: Self-pay | Admitting: Family Medicine

## 2020-03-04 NOTE — Telephone Encounter (Signed)
See other mychart message.

## 2020-03-06 ENCOUNTER — Ambulatory Visit: Payer: Medicare Other

## 2020-03-08 ENCOUNTER — Telehealth: Payer: Self-pay

## 2020-03-08 NOTE — Telephone Encounter (Signed)
Called patient and scheduled an office visit on 03/12/20

## 2020-03-12 ENCOUNTER — Ambulatory Visit: Payer: Medicare Other | Admitting: Family Medicine

## 2020-03-27 ENCOUNTER — Other Ambulatory Visit: Payer: Self-pay | Admitting: Family Medicine

## 2020-04-08 ENCOUNTER — Ambulatory Visit: Payer: Medicare Other | Admitting: Podiatry

## 2020-04-22 ENCOUNTER — Encounter: Payer: Self-pay | Admitting: Family Medicine

## 2020-04-22 ENCOUNTER — Telehealth: Payer: Self-pay | Admitting: Family Medicine

## 2020-04-22 ENCOUNTER — Other Ambulatory Visit: Payer: Self-pay

## 2020-04-22 ENCOUNTER — Ambulatory Visit (INDEPENDENT_AMBULATORY_CARE_PROVIDER_SITE_OTHER): Payer: Medicare Other | Admitting: Family Medicine

## 2020-04-22 DIAGNOSIS — M25511 Pain in right shoulder: Secondary | ICD-10-CM | POA: Diagnosis not present

## 2020-04-22 DIAGNOSIS — F419 Anxiety disorder, unspecified: Secondary | ICD-10-CM

## 2020-04-22 DIAGNOSIS — I872 Venous insufficiency (chronic) (peripheral): Secondary | ICD-10-CM | POA: Diagnosis not present

## 2020-04-22 DIAGNOSIS — I10 Essential (primary) hypertension: Secondary | ICD-10-CM | POA: Diagnosis not present

## 2020-04-22 DIAGNOSIS — F329 Major depressive disorder, single episode, unspecified: Secondary | ICD-10-CM

## 2020-04-22 DIAGNOSIS — F32A Depression, unspecified: Secondary | ICD-10-CM

## 2020-04-22 NOTE — Progress Notes (Signed)
[name], MD Phone: [phone]  Toni Berger is a 74 y.o. female who presents today for f/u.  HYPERTENSION  Disease Monitoring  Home BP Monitoring typically <130/80 Chest pain- no    Dyspnea- no Medications  Compliance-  Taking amlodipine, lisinopril  Anxiety/depression: Notes the anxiety is relatively stable.  She does note some depression occasionally.  She has been having a lot of grief recently from her husband passing away 4 years ago.  She notes no SI.  She tries to spread the clonazepam out as much as she is able to and notes it is beneficial.  It does not alter her in any way.  Venous stasis dermatitis: Right lower leg.  She had an ultrasound previously that ruled out DVT.  She has stasis dermatitis findings including rash and itching.  She has not been able to tolerate compression stockings.  She uses lotion on her leg with good benefit for the itching.  She does occasionally take tramadol when her legs hurt.  Right shoulder/bicep pain: This started in the last 3 months or so.  No injury.  Hurts with abduction of the shoulder.  She thought it was when she was sleeping on it.  Occasionally takes a tramadol for this.  Social History   Tobacco Use  Smoking Status Former Smoker  Smokeless Tobacco Never Used     ROS see history of present illness  Objective  Physical Exam Vitals:   04/22/20 1301 04/22/20 1346  BP: 136/82 138/70  Pulse: 75   Temp: 97.9 F (36.6 C)   SpO2: 96%     BP Readings from Last 3 Encounters:  04/22/20 138/70  02/26/20 138/80  01/19/20 110/80   Wt Readings from Last 3 Encounters:  04/22/20 203 lb (92.1 kg)  02/26/20 202 lb 9.6 oz (91.9 kg)  01/19/20 206 lb 12.8 oz (93.8 kg)    Physical Exam Constitutional:      General: She is not in acute distress.    Appearance: She is not diaphoretic.  Cardiovascular:     Rate and Rhythm: Normal rate and regular rhythm.     Heart sounds: Normal heart sounds.  Pulmonary:     Effort:  Pulmonary effort is normal.     Breath sounds: Normal breath sounds.  Musculoskeletal:     Comments: Left lower extremity with no edema, right lower extremity with 1+ pitting edema, venous stasis dermatitis changes from her mid shin down in her right lower extremity, bilateral shoulders with full range of motion, there is discomfort on active and passive internal range of motion, external range of motion, and abduction of the right shoulder, positive empty can on the right, negative empty can on the left, positive speeds on the right, negative speeds on the left  Skin:    General: Skin is warm and dry.  Neurological:     Mental Status: She is alert.      Assessment/Plan: Please see individual problem list.  HYPERTENSION, BENIGN Slightly above goal though could be acceptable for her age.  We will have her monitor on her current medications.  Return in 2 weeks for nurse BP check.  Right shoulder pain Concern for rotator cuff tendinopathy versus biceps tendinitis.  Exercises given.  Discussed physical therapy evaluation though she declines this at this time.  If not improving we could refer to physical therapy.  Anxiety and depression Continues to have some symptoms.  Offered Cymbalta to treat this as well as some of her chronic pain issues.  She wants to research this and then she will let us know if she would like to try this.  She can continue clonazepam as needed.  Venous stasis dermatitis Offered topical steroid though she declines.  She can continue over-the-counter lotion.   No orders of the defined types were placed in this encounter.   No orders of the defined types were placed in this encounter.   This visit occurred during the SARS-CoV-2 public health emergency.  Safety protocols were in place, including screening questions prior to the visit, additional usage of staff PPE, and extensive cleaning of exam room while observing appropriate contact time as indicated for  disinfecting solutions.    [name], MD [place]

## 2020-04-22 NOTE — Assessment & Plan Note (Signed)
Slightly above goal though could be acceptable for her age.  We will have her monitor on her current medications.  Return in 2 weeks for nurse BP check.

## 2020-04-22 NOTE — Patient Instructions (Signed)
Nice to see you. Please do the exercises for your shoulder. Please let us know if you would like to try the Cymbalta for your anxiety and pain. We will have you return in 2 weeks for nurse BP check.   Secondary Shoulder Impingement Syndrome Rehab Ask your health care provider which exercises are safe for you. Do exercises exactly as told by your health care provider and adjust them as directed. It is normal to feel mild stretching, pulling, tightness, or discomfort as you do these exercises. Stop right away if you feel sudden pain or your pain gets worse. Do not begin these exercises until told by your health care provider. Stretching and range-of-motion exercise This exercise warms up your muscles and joints and improves the movement and flexibility of your neck and shoulder. This exercise also helps to relieve pain and stiffness. Cervical side bend  1. Using good posture, sit on a stable chair, or stand up. 2. Without moving your shoulders, slowly tilt your left / right ear toward your left / right shoulder until you feel a stretch in your neck (cervical) muscles on the other side. You should be looking straight ahead. 3. Hold for __________ seconds. 4. Slowly return to the starting position. 5. Repeat the stretch on your left / right side. Repeat __________ times. Complete this exercise __________ times a day. Strengthening exercises These exercises build strength and endurance in your shoulder. Endurance is the ability to use your muscles for a long time, even after they get tired. Scapular protraction, supine  1. Lie on your back on a firm surface (supine position). Hold a __________ weight in your left / right hand. 2. Raise your left / right arm straight into the air so your hand is directly above your shoulder joint. 3. Push the weight into the air so your shoulder (scapula) lifts off the surface that you are lying on. The scapula will push up or forward (protraction). Do not move  your head, neck, or back. 4. Hold for __________ seconds. 5. Slowly return to the starting position. Let your muscles relax completely before you repeat this exercise. Repeat __________ times. Complete this exercise __________ times a day. Scapular retraction  1. Sit in a stable chair without armrests, or stand up. 2. Secure an exercise band to a stable object in front of you so the band is at shoulder height. 3. Hold one end of the exercise band in each hand. Your palms should face down. 4. Squeeze your shoulder blades (scapulae) together and move your elbows slightly behind you (retraction). Do not shrug your shoulders upward while you do this. 5. Hold for __________ seconds. 6. Slowly return to the starting position. Repeat __________ times. Complete this exercise __________ times a day. Shoulder extension with scapular retraction  1. Sit in a stable chair without armrests, or stand up. 2. Secure an exercise band to a stable object in front of you so the band is above shoulder height. 3. Hold one end of the exercise band in each hand. 4. Straighten your elbows and lift your hands up to shoulder height. 5. Squeeze your shoulder blades together (scapular retraction) and pull your hands down to the sides of your thighs (shoulder extension). Stop when your hands are straight down by your sides. Do not let your hands go behind your body. 6. Hold for __________ seconds. 7. Slowly return to the starting position. Repeat __________ times. Complete this exercise __________ times a day. Shoulder abduction 1. Sit in a stable  chair without armrests, or stand up. 2. If directed, hold a __________ weight in your left / right hand. 3. Start with your arms straight down. Turn your left / right hand so your palm faces in, toward your body. 4. Slowly lift your left / right hand out to your side (abduction). Do not lift your hand above shoulder height. ? Keep your arms straight. ? Avoid shrugging your  shoulder while you do this movement. Keep your shoulder blade tucked down toward the middle of your back. 5. Hold for __________ seconds. 6. Slowly lower your arm, and return to the starting position. Repeat __________ times. Complete this exercise __________ times a day. This information is not intended to replace advice given to you by your health care provider. Make sure you discuss any questions you have with your health care provider. Document Revised: 12/23/2018 Document Reviewed: 10/06/2018 Elsevier Patient Education  [place].

## 2020-04-22 NOTE — Assessment & Plan Note (Signed)
Offered topical steroid though she declines.  She can continue over-the-counter lotion.

## 2020-04-22 NOTE — Assessment & Plan Note (Signed)
Continues to have some symptoms.  Offered Cymbalta to treat this as well as some of her chronic pain issues.  She wants to research this and then she will let us know if she would like to try this.  She can continue clonazepam as needed.

## 2020-04-22 NOTE — Telephone Encounter (Signed)
Error

## 2020-04-22 NOTE — Assessment & Plan Note (Signed)
Concern for rotator cuff tendinopathy versus biceps tendinitis.  Exercises given.  Discussed physical therapy evaluation though she declines this at this time.  If not improving we could refer to physical therapy.

## 2020-04-23 ENCOUNTER — Telehealth: Payer: Self-pay | Admitting: Family Medicine

## 2020-04-23 ENCOUNTER — Encounter: Payer: Self-pay | Admitting: Family Medicine

## 2020-04-23 NOTE — Telephone Encounter (Signed)
Pt wanted to discuss Cymbalta and starting the medication and side effects

## 2020-04-24 NOTE — Telephone Encounter (Signed)
Patient sent a email to provider.  Brailyn Killion,cma

## 2020-04-25 ENCOUNTER — Encounter: Payer: Self-pay | Admitting: Family Medicine

## 2020-04-25 ENCOUNTER — Other Ambulatory Visit: Payer: Self-pay | Admitting: Family Medicine

## 2020-05-06 NOTE — Telephone Encounter (Signed)
Patient called in about taking another tarmadol wanted to know if he would increase the amount.

## 2020-05-09 ENCOUNTER — Ambulatory Visit: Payer: Medicare Other | Admitting: Podiatry

## 2020-05-09 ENCOUNTER — Other Ambulatory Visit: Payer: Self-pay

## 2020-05-09 ENCOUNTER — Encounter: Payer: Self-pay | Admitting: Podiatry

## 2020-05-09 ENCOUNTER — Ambulatory Visit: Payer: Medicare Other

## 2020-05-09 ENCOUNTER — Ambulatory Visit (INDEPENDENT_AMBULATORY_CARE_PROVIDER_SITE_OTHER): Payer: Medicare Other | Admitting: Podiatry

## 2020-05-09 DIAGNOSIS — B351 Tinea unguium: Secondary | ICD-10-CM | POA: Diagnosis not present

## 2020-05-09 DIAGNOSIS — Q828 Other specified congenital malformations of skin: Secondary | ICD-10-CM

## 2020-05-09 DIAGNOSIS — M79609 Pain in unspecified limb: Secondary | ICD-10-CM | POA: Diagnosis not present

## 2020-05-09 DIAGNOSIS — D689 Coagulation defect, unspecified: Secondary | ICD-10-CM | POA: Diagnosis not present

## 2020-05-09 NOTE — Progress Notes (Signed)
This patient returns to my office for at risk foot care.  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  This patient is unable to cut nails herself since the patient cannot reach her nails.These nails are painful walking and wearing shoes.  This patient presents for at risk foot care today.  General Appearance  Alert, conversant and in no acute stress.  Vascular  Dorsalis pedis and posterior tibial  pulses are palpable  bilaterally.  Capillary return is within normal limits  bilaterally. Temperature is within normal limits  bilaterally.  Neurologic  Senn-Weinstein monofilament wire test within normal limits  bilaterally. Muscle power within normal limits bilaterally.  Nails Thick disfigured discolored nails with subungual debris  from hallux to fifth toes bilaterally. No evidence of bacterial infection or drainage bilaterally.  Orthopedic  No limitations of motion  feet .  No crepitus or effusions noted.  No bony pathology or digital deformities noted.  Skin  normotropic skin  noted bilaterally.  No signs of infections or ulcers noted.   Porokeratosis sub 2 left foot.  Onychomycosis  Pain in right toes  Pain in left toes  Porokeratosis  Left forefoot.  Consent was obtained for treatment procedures.   Mechanical debridement of nails 1-5  bilaterally performed with a nail nipper.  Filed with dremel without incident.    Return office visit   3 months                   Told patient to return for periodic foot care and evaluation due to potential at risk complications.   Gardiner Barefoot DPM

## 2020-05-23 ENCOUNTER — Other Ambulatory Visit: Payer: Self-pay | Admitting: Family Medicine

## 2020-05-27 ENCOUNTER — Other Ambulatory Visit: Payer: Self-pay | Admitting: Family Medicine

## 2020-06-14 ENCOUNTER — Other Ambulatory Visit: Payer: Self-pay | Admitting: Family Medicine

## 2020-06-15 ENCOUNTER — Other Ambulatory Visit: Payer: Self-pay | Admitting: Family Medicine

## 2020-06-15 ENCOUNTER — Encounter: Payer: Self-pay | Admitting: Family Medicine

## 2020-06-19 ENCOUNTER — Telehealth: Payer: Self-pay | Admitting: Family Medicine

## 2020-06-19 ENCOUNTER — Other Ambulatory Visit: Payer: Self-pay | Admitting: Family Medicine

## 2020-06-19 ENCOUNTER — Encounter: Payer: Self-pay | Admitting: Family Medicine

## 2020-06-19 NOTE — Telephone Encounter (Signed)
Patient call in need refill for clonazePAM (KLONOPIN) 1 MG tablet

## 2020-06-19 NOTE — Telephone Encounter (Signed)
This was previously sent in to her pharmacy to be filled on 10/14 when it is due to be refilled.

## 2020-06-21 ENCOUNTER — Other Ambulatory Visit: Payer: Self-pay | Admitting: Family Medicine

## 2020-06-21 ENCOUNTER — Encounter: Payer: Self-pay | Admitting: Family Medicine

## 2020-06-21 NOTE — Telephone Encounter (Signed)
I called the pharmacy and spoke with Estill Bamberg. She states that they did not receive the clonazepam prescription but they did receive the Tramadol. Estill Bamberg state that the patient could not refill the Tramadol until the 11th due to its instructions.

## 2020-06-24 MED ORDER — CLONAZEPAM 1 MG PO TABS
ORAL_TABLET | ORAL | 0 refills | Status: DC
Start: 2020-06-27 — End: 2020-07-16

## 2020-06-28 ENCOUNTER — Encounter: Payer: Self-pay | Admitting: Family Medicine

## 2020-07-15 ENCOUNTER — Other Ambulatory Visit: Payer: Self-pay | Admitting: Family Medicine

## 2020-07-16 ENCOUNTER — Other Ambulatory Visit: Payer: Self-pay | Admitting: General Surgery

## 2020-07-16 DIAGNOSIS — R920 Mammographic microcalcification found on diagnostic imaging of breast: Secondary | ICD-10-CM

## 2020-07-18 ENCOUNTER — Telehealth: Payer: Self-pay | Admitting: Family Medicine

## 2020-07-18 ENCOUNTER — Other Ambulatory Visit: Payer: Self-pay | Admitting: Family Medicine

## 2020-07-18 NOTE — Telephone Encounter (Addendum)
Patient called back again for her refill for traMADol (ULTRAM) 50 MG tablet she wants to pick it the same time as other medication at the pharmacy.

## 2020-07-18 NOTE — Telephone Encounter (Signed)
Pt will need a refill on traMADol (ULTRAM) 50 MG tablet sent to [facility]  Pt stated she is not due til 11/10 but wanted it sent over now

## 2020-07-19 MED ORDER — TRAMADOL HCL 50 MG PO TABS
50.0000 mg | ORAL_TABLET | Freq: Three times a day (TID) | ORAL | 0 refills | Status: DC | PRN
Start: 2020-07-19 — End: 2020-08-12

## 2020-07-19 NOTE — Telephone Encounter (Signed)
Sent to pharmacy 

## 2020-07-19 NOTE — Addendum Note (Signed)
Addended by: Caryl Bis Bird Swetz G on: 07/19/2020 12:44 PM   Modules accepted: Orders

## 2020-07-23 ENCOUNTER — Telehealth (INDEPENDENT_AMBULATORY_CARE_PROVIDER_SITE_OTHER): Payer: Medicare Other | Admitting: Family Medicine

## 2020-07-23 ENCOUNTER — Telehealth: Payer: Self-pay

## 2020-07-23 ENCOUNTER — Other Ambulatory Visit: Payer: Self-pay | Admitting: Family Medicine

## 2020-07-23 ENCOUNTER — Encounter: Payer: Self-pay | Admitting: Family Medicine

## 2020-07-23 ENCOUNTER — Other Ambulatory Visit: Payer: Self-pay

## 2020-07-23 VITALS — BP 155/83 | HR 74 | Ht 68.0 in | Wt 203.0 lb

## 2020-07-23 DIAGNOSIS — F32A Depression, unspecified: Secondary | ICD-10-CM | POA: Diagnosis not present

## 2020-07-23 DIAGNOSIS — F419 Anxiety disorder, unspecified: Secondary | ICD-10-CM | POA: Diagnosis not present

## 2020-07-23 DIAGNOSIS — I872 Venous insufficiency (chronic) (peripheral): Secondary | ICD-10-CM

## 2020-07-23 DIAGNOSIS — I10 Essential (primary) hypertension: Secondary | ICD-10-CM

## 2020-07-23 MED ORDER — HYDROCHLOROTHIAZIDE 12.5 MG PO TABS: 12.5000 mg | ORAL_TABLET | Freq: Every day | ORAL | 1 refills | Status: DC

## 2020-07-23 NOTE — Telephone Encounter (Signed)
Pt called states that she does not want her meds changed. She states that she looked up the medication and it has too many side effects. FYI

## 2020-07-23 NOTE — Assessment & Plan Note (Signed)
Currently with no depression though does continue to have anxiety issues.  I discussed Cymbalta with her and noted that the benefit would be for her anxiety as well as her pain.  She does not want to start on this type of medication at this time and I asked her to continue to consider it as I think it would be beneficial.  Discussed the risk of chronic clonazepam use including risk of dependence and addiction as well as risk of dementia.  Discussed my preference would be to get her off of clonazepam altogether at some point in the future.

## 2020-07-23 NOTE — Telephone Encounter (Signed)
.  Noted.  Please let the patient know that we are going to have to pick a different medicine than the HCTZ given her sulfa allergy I would like to try her on carvedilol.  This is not a fluid pill and thus she would not have to worry about increased urination with it.  She would need to monitor her heart rate while on this medicine and if it starts to go below 60 consistently she should let us know.

## 2020-07-23 NOTE — Telephone Encounter (Signed)
Spoke with patient and she states she will look up the medication before she decides anything and will call back with decision

## 2020-07-23 NOTE — Telephone Encounter (Signed)
[facility] called and states patient can not take HCTZ; patient is allergy to sulfa drugs.

## 2020-07-23 NOTE — Patient Instructions (Signed)
Nice to see you today. We are changing you to hydrochlorothiazide 12.5 mg once daily.  You will discontinue the amlodipine.  Do not start back on the amlodipine.  If you have issues with the hydrochlorothiazide please call us.

## 2020-07-23 NOTE — Assessment & Plan Note (Signed)
Seems to be uncontrolled though she has not been checking consistently at home.  Discussed checking daily and sending [place] her readings in 2 weeks.  We will change her from amlodipine to hydrochlorothiazide 12.5 mg once daily.  She will continue on lisinopril 40 mg daily.  She will return in 1 month for a nurse BP check and labs.  The patient was very concerned that the hydrochlorothiazide would increase her urination.  I discussed that some people do have that side effect though others do not notice any change.  Discussed that I think this change to the hydrochlorothiazide will be potentially beneficial for her swelling in her legs.  She will contact us if she has any excessive increase in her urination or if she notices other side effects such as lightheadedness.  Discussed the risk of low sodium as well.

## 2020-07-23 NOTE — Telephone Encounter (Signed)
Patient called in stated that she would continue to take her old bloo pressure medication and will check her bp

## 2020-07-23 NOTE — Progress Notes (Signed)
Virtual Visit via video Note  This visit type was conducted due to national recommendations for restrictions regarding the COVID-19 pandemic (e.g. social distancing).  This format is felt to be most appropriate for this patient at this time.  All issues noted in this document were discussed and addressed.  No physical exam was performed (except for noted visual exam findings with Video Visits).   I connected with Toni Berger today at 12:00 PM EST by a video enabled telemedicine application and verified that I am speaking with the correct person using two identifiers. Location patient: home Location provider: work  Persons participating in the virtual visit: patient, provider  I discussed the limitations, risks, security and privacy concerns of performing an evaluation and management service by telephone and the availability of in person appointments. I also discussed with the patient that there may be a patient responsible charge related to this service. The patient expressed understanding and agreed to proceed.   Reason for visit: f/u.  HPI: HYPERTENSION  Disease Monitoring  Home BP Monitoring not checking, though did check today and was elevated to 151/82, 146/81 Chest pain- no    Dyspnea- no Medications  Compliance-  Taking amlodipine, lisinopril.  Edema- see below  Chronic leg swelling: Patient notes this continues to be an issue.  Notes they burn at times.  There is erythema and blistering at times.  Propping the legs up causes indentation so she does not like to do that.  She takes a tramadol 1 in the morning and occasionally 1 at night to help with the discomfort.  It does not make her drowsy.  She does not drink any alcohol with this.  Anxiety: Occasionally has this with her legs.  The clonazepam does seem to help.  She notes no depression.  She is very hesitant to start on something like Cymbalta as she feels it may alter her thinking.     ROS: See pertinent positives and  negatives per HPI.  Past Medical History:  Diagnosis Date  . Allergy   . Anxiety   . Arthritis   . Fibrocystic breast   . Hyperlipidemia   . Hypertension   . Lipoma    5/01 Abdominal wall lipoma  . Loss of consciousness ([place]) 12/15/2015  . Migraines   . Osteoporosis, unspecified   . Restless leg syndrome   . Stroke (cerebrum) [facility])     Past Surgical History:  Procedure Laterality Date  . ABDOMINAL HYSTERECTOMY    . APPENDECTOMY  1999  . BREAST CYST ASPIRATION    . CHOLECYSTECTOMY  1999  . DOBUTAMINE STRESS ECHO  06/96   negative  . ESOPHAGOGASTRODUODENOSCOPY N/A 03/30/2019   Procedure: ESOPHAGOGASTRODUODENOSCOPY (EGD);  Surgeon: Lin Landsman, MD;  Location: [facility] ENDOSCOPY;  Service: Gastroenterology;  Laterality: N/A;  . GIVENS CAPSULE STUDY N/A 03/31/2019   Procedure: GIVENS CAPSULE STUDY;  Surgeon: Lin Landsman, MD;  Location: [facility] ENDOSCOPY;  Service: Gastroenterology;  Laterality: N/A;  . TONSILLECTOMY AND ADENOIDECTOMY  1974  . TOTAL ABDOMINAL HYSTERECTOMY W/ BILATERAL SALPINGOOPHORECTOMY  1981    Family History  Problem Relation Age of Onset  . Heart disease Mother   . Diabetes Mother   . Aneurysm Mother        AAA  . Heart failure Mother   . Heart failure Father   . Coronary artery disease Father   . Colon cancer Neg Hx   . Breast cancer Neg Hx     SOCIAL HX: Former smoker  Current Outpatient Medications:  .  [START ON 07/24/2020] clonazePAM (KLONOPIN) 1 MG tablet, TAKE 1 TABLET BY MOUTH EVERY 8 TO 12 HOURS AS NEEDED FOR ANXIETY (MAX 3 DOSES IN 24 HOURS), Disp: 90 tablet, Rfl: 0 .  clopidogrel (PLAVIX) 75 MG tablet, Take 1 tablet (75 mg total) by mouth daily., Disp: 90 tablet, Rfl: 3 .  cyanocobalamin 1000 MCG tablet, Take 1,000 mcg by mouth daily., Disp: , Rfl:  .  lisinopril (ZESTRIL) 40 MG tablet, TAKE 1 TABLET BY MOUTH EVERY DAY, Disp: 90 tablet, Rfl: 3 .  traMADol (ULTRAM) 50 MG tablet, Take 1 tablet (50 mg total) by mouth every 8 (eight)  hours as needed for severe pain., Disp: 90 tablet, Rfl: 0 .  hydrochlorothiazide (HYDRODIURIL) 12.5 MG tablet, Take 1 tablet (12.5 mg total) by mouth daily., Disp: 30 tablet, Rfl: 1  EXAM:  VITALS per patient if applicable:  GENERAL: alert, oriented, appears well and in no acute distress  HEENT: atraumatic, conjunttiva clear, no obvious abnormalities on inspection of external nose and ears  NECK: normal movements of the head and neck  LUNGS: on inspection no signs of respiratory distress, breathing rate appears normal, no obvious gross SOB, gasping or wheezing  CV: no obvious cyanosis  MS: moves all visible extremities without noticeable abnormality  PSYCH/NEURO: pleasant and cooperative, no obvious depression or anxiety, speech and thought processing grossly intact  ASSESSMENT AND PLAN:  Discussed the following assessment and plan:  Problem List Items Addressed This Visit    Anxiety and depression    Currently with no depression though does continue to have anxiety issues.  I discussed Cymbalta with her and noted that the benefit would be for her anxiety as well as her pain.  She does not want to start on this type of medication at this time and I asked her to continue to consider it as I think it would be beneficial.  Discussed the risk of chronic clonazepam use including risk of dependence and addiction as well as risk of dementia.  Discussed my preference would be to get her off of clonazepam altogether at some point in the future.      HYPERTENSION, BENIGN - Primary    Seems to be uncontrolled though she has not been checking consistently at home.  Discussed checking daily and sending [place] her readings in 2 weeks.  We will change her from amlodipine to hydrochlorothiazide 12.5 mg once daily.  She will continue on lisinopril 40 mg daily.  She will return in 1 month for a nurse BP check and labs.  The patient was very concerned that the hydrochlorothiazide would increase her  urination.  I discussed that some people do have that side effect though others do not notice any change.  Discussed that I think this change to the hydrochlorothiazide will be potentially beneficial for her swelling in her legs.  She will contact us if she has any excessive increase in her urination or if she notices other side effects such as lightheadedness.  Discussed the risk of low sodium as well.      Relevant Medications   hydrochlorothiazide (HYDRODIURIL) 12.5 MG tablet   Other Relevant Orders   Basic Metabolic Panel (BMET)   Venous stasis dermatitis    Patient with chronic venous stasis as well as dermatitis changes.  We will see if taking her off of the amlodipine makes a difference for her swelling.  I discussed the potential for seeing vascular surgery to consider compression stockings though  she declines this at this time.          I discussed the assessment and treatment plan with the patient. The patient was provided an opportunity to ask questions and all were answered. The patient agreed with the plan and demonstrated an understanding of the instructions.   The patient was advised to call back or seek an in-person evaluation if the symptoms worsen or if the condition fails to improve as anticipated.   [name], MD

## 2020-07-23 NOTE — Assessment & Plan Note (Signed)
Patient with chronic venous stasis as well as dermatitis changes.  We will see if taking her off of the amlodipine makes a difference for her swelling.  I discussed the potential for seeing vascular surgery to consider compression stockings though she declines this at this time.

## 2020-07-24 NOTE — Telephone Encounter (Signed)
LMTCB

## 2020-07-24 NOTE — Telephone Encounter (Signed)
We really need to change her amlodipine to a different medication to see if it helps with her swelling. I think the coreg is a good option for her and we could try it. If she has any side effects we could choose a different medication.

## 2020-07-25 ENCOUNTER — Encounter: Payer: Self-pay | Admitting: Family Medicine

## 2020-07-25 ENCOUNTER — Other Ambulatory Visit: Payer: Self-pay

## 2020-07-25 ENCOUNTER — Encounter: Payer: Self-pay | Admitting: Podiatry

## 2020-07-25 ENCOUNTER — Ambulatory Visit (INDEPENDENT_AMBULATORY_CARE_PROVIDER_SITE_OTHER): Payer: Medicare Other | Admitting: Podiatry

## 2020-07-25 DIAGNOSIS — D689 Coagulation defect, unspecified: Secondary | ICD-10-CM | POA: Diagnosis not present

## 2020-07-25 DIAGNOSIS — Q828 Other specified congenital malformations of skin: Secondary | ICD-10-CM | POA: Diagnosis not present

## 2020-07-25 DIAGNOSIS — B351 Tinea unguium: Secondary | ICD-10-CM | POA: Diagnosis not present

## 2020-07-25 DIAGNOSIS — M79609 Pain in unspecified limb: Secondary | ICD-10-CM

## 2020-07-25 NOTE — Progress Notes (Signed)
This patient returns to my office for at risk foot care.  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  This patient is unable to cut nails herself since the patient cannot reach her nails.These nails are painful walking and wearing shoes.  This patient presents for at risk foot care today.  General Appearance  Alert, conversant and in no acute stress.  Vascular  Dorsalis pedis and posterior tibial  pulses are palpable  bilaterally.  Capillary return is within normal limits  bilaterally. Temperature is within normal limits  bilaterally.  Neurologic  Senn-Weinstein monofilament wire test within normal limits  bilaterally. Muscle power within normal limits bilaterally.  Nails Thick disfigured discolored nails with subungual debris  from hallux to fifth toes bilaterally. No evidence of bacterial infection or drainage bilaterally.  Orthopedic  No limitations of motion  feet .  No crepitus or effusions noted.  No bony pathology or digital deformities noted.  Skin  normotropic skin  noted bilaterally.  No signs of infections or ulcers noted.   Porokeratosis sub 2 left foot.  Onychomycosis  Pain in right toes  Pain in left toes  Porokeratosis  Left forefoot.  Consent was obtained for treatment procedures.   Mechanical debridement of nails 1-5  bilaterally performed with a nail nipper.  Filed with dremel without incident.    Return office visit   9 weeks                   Told patient to return for periodic foot care and evaluation due to potential at risk complications.   Gardiner Barefoot DPM

## 2020-07-25 NOTE — Telephone Encounter (Signed)
Patient sent a mychart message and I sent message back to her about trying to coreg; will wait on response.

## 2020-07-29 NOTE — Telephone Encounter (Signed)
Patient replied via mychart and message was sent to Dr. Caryl Bis

## 2020-07-30 ENCOUNTER — Other Ambulatory Visit: Payer: Self-pay | Admitting: Family Medicine

## 2020-08-10 ENCOUNTER — Other Ambulatory Visit: Payer: Self-pay | Admitting: Family Medicine

## 2020-08-12 ENCOUNTER — Ambulatory Visit: Payer: Medicare Other | Admitting: Podiatry

## 2020-08-14 ENCOUNTER — Telehealth: Payer: Self-pay | Admitting: Family Medicine

## 2020-08-14 NOTE — Telephone Encounter (Signed)
Pt called and wanted to know if she could drink orange juice with the medication she is on  Please call pt back or send her a mychart message

## 2020-08-15 NOTE — Telephone Encounter (Signed)
It is fine for her to drink orange juice.

## 2020-08-15 NOTE — Telephone Encounter (Signed)
I called and spoke with the patient and informed her that the provider stated she could drink OJ with her medications she understood.  Toni Berger

## 2020-09-02 ENCOUNTER — Other Ambulatory Visit: Payer: Medicare Other

## 2020-09-06 ENCOUNTER — Telehealth: Payer: Self-pay | Admitting: Family Medicine

## 2020-09-06 NOTE — Telephone Encounter (Signed)
This patient was supposed to have a CT scan to follow-up on an adrenal adenoma last year though it does not look like this was ever completed. I would like to complete this to make sure that it has not changed. I can order it if she is willing to have this imaging done. Thanks.

## 2020-09-10 ENCOUNTER — Other Ambulatory Visit: Payer: Self-pay | Admitting: Family Medicine

## 2020-09-11 NOTE — Telephone Encounter (Signed)
I called and spoke to the patient and she stated she does not want the Ct right now.  If she changes her mind she will let us know.  Zaviyar Rahal,cma

## 2020-09-11 NOTE — Telephone Encounter (Signed)
Noted. I will plan to discuss further at follow up.

## 2020-09-30 ENCOUNTER — Ambulatory Visit: Payer: Medicare Other | Admitting: Podiatry

## 2020-10-08 ENCOUNTER — Other Ambulatory Visit: Payer: Self-pay | Admitting: Family Medicine

## 2020-10-24 ENCOUNTER — Encounter: Payer: Self-pay | Admitting: Podiatry

## 2020-10-24 ENCOUNTER — Other Ambulatory Visit: Payer: Self-pay

## 2020-10-24 ENCOUNTER — Ambulatory Visit (INDEPENDENT_AMBULATORY_CARE_PROVIDER_SITE_OTHER): Payer: Medicare Other | Admitting: Podiatry

## 2020-10-24 DIAGNOSIS — B351 Tinea unguium: Secondary | ICD-10-CM | POA: Diagnosis not present

## 2020-10-24 DIAGNOSIS — M79676 Pain in unspecified toe(s): Secondary | ICD-10-CM

## 2020-10-24 DIAGNOSIS — Q828 Other specified congenital malformations of skin: Secondary | ICD-10-CM | POA: Diagnosis not present

## 2020-10-24 DIAGNOSIS — D689 Coagulation defect, unspecified: Secondary | ICD-10-CM | POA: Diagnosis not present

## 2020-10-24 NOTE — Progress Notes (Addendum)
This patient returns to my office for at risk foot care.  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  This patient is unable to cut nails herself since the patient cannot reach her nails.These nails are painful walking and wearing shoes.  Patient has forefoot callus left foot which is killing her  This patient presents for at risk foot care today.  General Appearance  Alert, conversant and in no acute stress.  Vascular  Dorsalis pedis and posterior tibial  pulses are palpable  bilaterally.  Capillary return is within normal limits  bilaterally. Temperature is within normal limits  bilaterally.  Neurologic  Senn-Weinstein monofilament wire test within normal limits  bilaterally. Muscle power within normal limits bilaterally.  Nails Thick disfigured discolored nails with subungual debris  from hallux to fifth toes bilaterally. No evidence of bacterial infection or drainage bilaterally.  Orthopedic  No limitations of motion  feet .  No crepitus or effusions noted.  No bony pathology or digital deformities noted.  Skin  normotropic skin  noted bilaterally.  No signs of infections or ulcers noted.   Porokeratosis sub 2 left foot.  Onychomycosis  Pain in right toes  Pain in left toes  Porokeratosis  Left forefoot.  Consent was obtained for treatment procedures.   Mechanical debridement of nails 1-5  bilaterally performed with a nail nipper.  Filed with dremel without incident.  Padding dispensed.   Return office visit   9 weeks                   Told patient to return for periodic foot care and evaluation due to potential at risk complications.   Gardiner Barefoot DPM

## 2020-10-31 ENCOUNTER — Ambulatory Visit: Payer: Medicare Other | Admitting: Podiatry

## 2020-11-06 ENCOUNTER — Telehealth (INDEPENDENT_AMBULATORY_CARE_PROVIDER_SITE_OTHER): Payer: Medicare Other | Admitting: Family Medicine

## 2020-11-06 ENCOUNTER — Other Ambulatory Visit: Payer: Self-pay

## 2020-11-06 ENCOUNTER — Encounter: Payer: Self-pay | Admitting: Family Medicine

## 2020-11-06 ENCOUNTER — Other Ambulatory Visit: Payer: Self-pay | Admitting: Family Medicine

## 2020-11-06 DIAGNOSIS — Q828 Other specified congenital malformations of skin: Secondary | ICD-10-CM | POA: Diagnosis not present

## 2020-11-06 DIAGNOSIS — F32A Depression, unspecified: Secondary | ICD-10-CM | POA: Diagnosis not present

## 2020-11-06 DIAGNOSIS — M7062 Trochanteric bursitis, left hip: Secondary | ICD-10-CM | POA: Diagnosis not present

## 2020-11-06 DIAGNOSIS — F419 Anxiety disorder, unspecified: Secondary | ICD-10-CM | POA: Diagnosis not present

## 2020-11-06 DIAGNOSIS — I10 Essential (primary) hypertension: Secondary | ICD-10-CM | POA: Diagnosis not present

## 2020-11-06 NOTE — Progress Notes (Signed)
Virtual Visit via video Note  This visit type was conducted due to national recommendations for restrictions regarding the COVID-19 pandemic (e.g. social distancing).  This format is felt to be most appropriate for this patient at this time.  All issues noted in this document were discussed and addressed.  No physical exam was performed (except for noted visual exam findings with Video Visits).   I connected with Toni Berger today at  2:45 PM EST by a video enabled telemedicine application and verified that I am speaking with the correct person using two identifiers. Location patient: home Location provider: work  Persons participating in the virtual visit: patient, provider  I discussed the limitations, risks, security and privacy concerns of performing an evaluation and management service by telephone and the availability of in person appointments. I also discussed with the patient that there may be a patient responsible charge related to this service. The patient expressed understanding and agreed to proceed.  Reason for visit: f/u.  HPI: HYPERTENSION  Disease Monitoring  Home BP Monitoring typically less than 130/80. Chest pain-no    dyspnea-no Medications  Compliance-taking lisinopril 40 mg once daily, amlodipine 5 mg once daily.   Edema-improved, typically resolves overnight when laying down  Chronic hip pain: Patient notes this is tolerable with the tramadol.  She uses a cane to get around.  No drowsiness with the tramadol.  Does have grab bars in her shower.  Porokeratosis: Saw podiatry for this.  Notes improvement in foot pain after treatment for this.  Anxiety: Patient notes this is stable.  No depression.  She takes clonazepam mostly just twice a day though does take a third dose occasionally.  Has been hesitant to take other medications for this in the past.   ROS: See pertinent positives and negatives per HPI.  Past Medical History:  Diagnosis Date  . Allergy   .  Anxiety   . Arthritis   . Fibrocystic breast   . Hyperlipidemia   . Hypertension   . Lipoma    5/01 Abdominal wall lipoma  . Loss of consciousness ([place]) 12/15/2015  . Migraines   . Osteoporosis, unspecified   . Restless leg syndrome   . Stroke (cerebrum) [facility])     Past Surgical History:  Procedure Laterality Date  . ABDOMINAL HYSTERECTOMY    . APPENDECTOMY  1999  . BREAST CYST ASPIRATION    . CHOLECYSTECTOMY  1999  . DOBUTAMINE STRESS ECHO  06/96   negative  . ESOPHAGOGASTRODUODENOSCOPY N/A 03/30/2019   Procedure: ESOPHAGOGASTRODUODENOSCOPY (EGD);  Surgeon: Lin Landsman, MD;  Location: [facility] ENDOSCOPY;  Service: Gastroenterology;  Laterality: N/A;  . GIVENS CAPSULE STUDY N/A 03/31/2019   Procedure: GIVENS CAPSULE STUDY;  Surgeon: Lin Landsman, MD;  Location: [facility] ENDOSCOPY;  Service: Gastroenterology;  Laterality: N/A;  . TONSILLECTOMY AND ADENOIDECTOMY  1974  . TOTAL ABDOMINAL HYSTERECTOMY W/ BILATERAL SALPINGOOPHORECTOMY  1981    Family History  Problem Relation Age of Onset  . Heart disease Mother   . Diabetes Mother   . Aneurysm Mother        AAA  . Heart failure Mother   . Heart failure Father   . Coronary artery disease Father   . Colon cancer Neg Hx   . Breast cancer Neg Hx     SOCIAL HX: Former smoker   Current Outpatient Medications:  .  amLODipine (NORVASC) 5 MG tablet, Take 5 mg by mouth daily., Disp: , Rfl:  .  clonazePAM (KLONOPIN) 1 MG  tablet, TAKE 1 TABLET BY MOUTH EVERY 8 TO 12 HOURS AS NEEDED FOR ANXIETY (MAX 3 DOSES IN 24 HOURS), Disp: 90 tablet, Rfl: 0 .  clopidogrel (PLAVIX) 75 MG tablet, TAKE 1 TABLET BY MOUTH EVERY DAY, Disp: 90 tablet, Rfl: 3 .  cyanocobalamin 1000 MCG tablet, Take 1,000 mcg by mouth daily., Disp: , Rfl:  .  lisinopril (ZESTRIL) 40 MG tablet, TAKE 1 TABLET BY MOUTH EVERY DAY, Disp: 90 tablet, Rfl: 3 .  traMADol (ULTRAM) 50 MG tablet, TAKE 1 TABLET BY MOUTH EVERY 8 HOURS AS NEEDED FOR SEVERE PAIN., Disp: 90 tablet,  Rfl: 0  EXAM:  VITALS per patient if applicable:  GENERAL: alert, oriented, appears well and in no acute distress  HEENT: atraumatic, conjunttiva clear, no obvious abnormalities on inspection of external nose and ears  NECK: normal movements of the head and neck  LUNGS: on inspection no signs of respiratory distress, breathing rate appears normal, no obvious gross SOB, gasping or wheezing  CV: no obvious cyanosis  MS: moves all visible extremities without noticeable abnormality  PSYCH/NEURO: pleasant and cooperative, no obvious depression or anxiety, speech and thought processing grossly intact  ASSESSMENT AND PLAN:  Discussed the following assessment and plan:  Problem List Items Addressed This Visit    Anxiety and depression    Stable.  I advised that she should limit her use of the clonazepam to twice a day.  Discussed risk of dementia, addiction, and dependence with this type of medication.  Discussed that my goal would be to limit her use of this medication moving forward.      HYPERTENSION, BENIGN    Seems to be generally well controlled.  We will have her blood pressure checked when she comes in for lab work.  She will continue lisinopril 40 mg once daily and amlodipine 5 mg once daily.      Relevant Medications   amLODipine (NORVASC) 5 MG tablet   Porokeratosis    Treated by podiatry.      Trochanteric bursitis of left hip    Chronic ongoing issue.  She can continue tramadol to help with her pain.  She will walk with a cane.  She has been resistant to being evaluated by a specialist for this in the past.          I discussed the assessment and treatment plan with the patient. The patient was provided an opportunity to ask questions and all were answered. The patient agreed with the plan and demonstrated an understanding of the instructions.   The patient was advised to call back or seek an in-person evaluation if the symptoms worsen or if the condition fails to  improve as anticipated.  [name], MD

## 2020-11-06 NOTE — Assessment & Plan Note (Signed)
Seems to be generally well controlled.  We will have her blood pressure checked when she comes in for lab work.  She will continue lisinopril 40 mg once daily and amlodipine 5 mg once daily.

## 2020-11-06 NOTE — Assessment & Plan Note (Signed)
Chronic ongoing issue.  She can continue tramadol to help with her pain.  She will walk with a cane.  She has been resistant to being evaluated by a specialist for this in the past.

## 2020-11-06 NOTE — Assessment & Plan Note (Signed)
Treated by podiatry.

## 2020-11-06 NOTE — Assessment & Plan Note (Signed)
Stable.  I advised that she should limit her use of the clonazepam to twice a day.  Discussed risk of dementia, addiction, and dependence with this type of medication.  Discussed that my goal would be to limit her use of this medication moving forward.

## 2020-12-05 ENCOUNTER — Other Ambulatory Visit: Payer: Self-pay | Admitting: Family Medicine

## 2020-12-20 ENCOUNTER — Ambulatory Visit: Payer: Medicare Other

## 2020-12-26 ENCOUNTER — Ambulatory Visit: Payer: Medicare Other | Admitting: Podiatry

## 2020-12-31 ENCOUNTER — Telehealth: Payer: Self-pay | Admitting: Family Medicine

## 2020-12-31 NOTE — Telephone Encounter (Signed)
NO ANSWER/NO VM. Pt due to schedule Medicare Annual Wellness Visit (AWV)   Please schedule on the [facility] template  No hx of previous AWV. please schedule at anytime.  This should be a 45 minute visit!

## 2021-01-01 ENCOUNTER — Other Ambulatory Visit: Payer: Self-pay | Admitting: Family Medicine

## 2021-01-01 NOTE — Telephone Encounter (Signed)
RX Refill:Tramadol and klonopin Last Seen:11-06-20 Last ordered:12-06-20

## 2021-01-07 ENCOUNTER — Other Ambulatory Visit: Payer: Self-pay | Admitting: Family Medicine

## 2021-01-16 ENCOUNTER — Ambulatory Visit: Payer: Medicare Other | Admitting: Podiatry

## 2021-01-20 ENCOUNTER — Encounter: Payer: Self-pay | Admitting: Family Medicine

## 2021-01-27 ENCOUNTER — Ambulatory Visit: Payer: Medicare Other | Admitting: Podiatry

## 2021-01-27 ENCOUNTER — Telehealth: Payer: Self-pay | Admitting: Family Medicine

## 2021-01-27 NOTE — Telephone Encounter (Signed)
Please contact the patient.  I received a reminder regarding her prior mammogram from the electronic medical record system.  Based on her discussion with Dr. Bary Castilla last year it looks like she was to undergo follow-up imaging for the previous abnormality seen on mammogram last year.  It looks like that never occurred.  Would she be willing to have follow-up mammogram completed?

## 2021-01-31 ENCOUNTER — Other Ambulatory Visit: Payer: Self-pay | Admitting: Family Medicine

## 2021-02-03 NOTE — Telephone Encounter (Signed)
Noted. It looks like she is due for follow-up with me. Please get her scheduled.

## 2021-02-03 NOTE — Telephone Encounter (Signed)
RX Refill:tramadol and klonopin Last Seen:11-06-20 Last ordered:01-09-21

## 2021-02-03 NOTE — Telephone Encounter (Signed)
I called the patient and she declined doing a follow up on the mammogram at this time.  Toni Berger,cma

## 2021-02-04 MED ORDER — CLONAZEPAM 1 MG PO TABS: ORAL_TABLET | ORAL | 0 refills | Status: DC

## 2021-02-04 MED ORDER — TRAMADOL HCL 50 MG PO TABS: ORAL_TABLET | ORAL | 0 refills | Status: DC

## 2021-02-04 NOTE — Addendum Note (Signed)
Addended by: Leone Haven on: 02/04/2021 04:08 PM   Modules accepted: Orders
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
## 2021-02-04 NOTE — Telephone Encounter (Signed)
She needs to be scheduled for follow-up prior to these being refilled.  Please call her to get her scheduled.

## 2021-02-04 NOTE — Telephone Encounter (Addendum)
Noted. Please advise the patient that I sent in a refill for her medications, though these are controlled substances and she will need to be seen in the office every 3 months for refills on these medications as they require closer levels of monitoring in patients on this type of medication. If she is not able to keep to this follow-up protocol then I will not be able to continue to prescribe these medications.

## 2021-02-05 ENCOUNTER — Encounter: Payer: Self-pay | Admitting: Family Medicine

## 2021-02-06 ENCOUNTER — Ambulatory Visit (INDEPENDENT_AMBULATORY_CARE_PROVIDER_SITE_OTHER): Payer: Medicare Other | Admitting: Family Medicine

## 2021-02-06 ENCOUNTER — Encounter: Payer: Self-pay | Admitting: Family Medicine

## 2021-02-06 ENCOUNTER — Other Ambulatory Visit: Payer: Self-pay

## 2021-02-06 ENCOUNTER — Ambulatory Visit: Payer: Medicare Other | Admitting: Podiatry

## 2021-02-06 VITALS — BP 140/80 | HR 79 | Temp 97.7°F | Ht 67.99 in | Wt 202.6 lb

## 2021-02-06 DIAGNOSIS — R928 Other abnormal and inconclusive findings on diagnostic imaging of breast: Secondary | ICD-10-CM

## 2021-02-06 DIAGNOSIS — Z862 Personal history of diseases of the blood and blood-forming organs and certain disorders involving the immune mechanism: Secondary | ICD-10-CM | POA: Diagnosis not present

## 2021-02-06 DIAGNOSIS — F32A Depression, unspecified: Secondary | ICD-10-CM

## 2021-02-06 DIAGNOSIS — I10 Essential (primary) hypertension: Secondary | ICD-10-CM

## 2021-02-06 DIAGNOSIS — M7062 Trochanteric bursitis, left hip: Secondary | ICD-10-CM | POA: Diagnosis not present

## 2021-02-06 DIAGNOSIS — E7849 Other hyperlipidemia: Secondary | ICD-10-CM | POA: Diagnosis not present

## 2021-02-06 DIAGNOSIS — F419 Anxiety disorder, unspecified: Secondary | ICD-10-CM | POA: Diagnosis not present

## 2021-02-06 DIAGNOSIS — G2581 Restless legs syndrome: Secondary | ICD-10-CM | POA: Diagnosis not present

## 2021-02-06 DIAGNOSIS — R7303 Prediabetes: Secondary | ICD-10-CM

## 2021-02-06 LAB — COMPREHENSIVE METABOLIC PANEL
ALT: 18 U/L (ref 0–35)
AST: 17 U/L (ref 0–37)
Albumin: 4.2 g/dL (ref 3.5–5.2)
Alkaline Phosphatase: 104 U/L (ref 39–117)
BUN: 26 mg/dL — ABNORMAL HIGH (ref 6–23)
CO2: 28 mEq/L (ref 19–32)
Calcium: 9.9 mg/dL (ref 8.4–10.5)
Chloride: 102 mEq/L (ref 96–112)
Creatinine, Ser: 0.87 mg/dL (ref 0.40–1.20)
GFR: 65.37 mL/min (ref >60.00)
Glucose, Bld: 81 mg/dL (ref 70–99)
Potassium: 4.1 mEq/L (ref 3.5–5.1)
Sodium: 138 mEq/L (ref 135–145)
Total Bilirubin: 0.7 mg/dL (ref 0.2–1.2)
Total Protein: 7.3 g/dL (ref 6.0–8.3)

## 2021-02-06 LAB — FERRITIN: Ferritin: 40.9 ng/mL (ref 10.0–291.0)

## 2021-02-06 LAB — LIPID PANEL
Cholesterol: 191 mg/dL (ref 0–200)
HDL: 67 mg/dL (ref >39.00)
LDL Cholesterol: 105 mg/dL — ABNORMAL HIGH (ref 0–99)
NonHDL: 124.41
Total CHOL/HDL Ratio: 3
Triglycerides: 95 mg/dL (ref 0.0–149.0)
VLDL: 19 mg/dL (ref 0.0–40.0)

## 2021-02-06 LAB — HEMOGLOBIN A1C: Hgb A1c MFr Bld: 6.1 % (ref 4.6–6.5)

## 2021-02-06 NOTE — Patient Instructions (Signed)
Nice to see. We will check labs.

## 2021-02-06 NOTE — Progress Notes (Signed)
[name], MD Phone: [phone]  Toni Berger is a 75 y.o. female who presents today for f/u.  Anxiety: She notes this is actually fairly well controlled.  She is not needing the clonazepam as much during the day though is using it at night for her restless leg syndrome.  No depression.  She does note she found out her brother passed away yesterday.  She was not particularly close with him and she is dealing with this well.  Hypertension: Not checking her blood pressure much at home.  She is taking amlodipine and lisinopril.  No chest pain or shortness of breath.  No change to her chronic intermittent edema.  Leg pain: This is a chronic issue.  The tramadol helps her significantly and allows her to be functional.  No drowsiness with this medication.  No alcohol intake.  Restless leg syndrome: This bothers her most nights.  She does follow with podiatry for her feet and she notes they advised her that her feet may be contributing.  She takes 1 clonazepam when she lays down and typically has to take another 1 when she wakes up in the middle the night to help with her legs.  Social History   Tobacco Use  Smoking Status Former Smoker  Smokeless Tobacco Never Used    Current Outpatient Medications on File Prior to Visit  Medication Sig Dispense Refill  . amLODipine (NORVASC) 5 MG tablet TAKE 1 TABLET BY MOUTH EVERY DAY 90 tablet 1  . clonazePAM (KLONOPIN) 1 MG tablet TAKE 1 TABLET BY MOUTH EVERY 8 TO 12 HOURS AS NEEDED FOR ANXIETY (MAX 3 DOSES IN 24 HOURS) 90 tablet 0  . clopidogrel (PLAVIX) 75 MG tablet TAKE 1 TABLET BY MOUTH EVERY DAY 90 tablet 3  . cyanocobalamin 1000 MCG tablet Take 1,000 mcg by mouth daily.    Marland Kitchen lisinopril (ZESTRIL) 40 MG tablet TAKE 1 TABLET BY MOUTH EVERY DAY 90 tablet 3  . traMADol (ULTRAM) 50 MG tablet TAKE 1 TABLET BY MOUTH EVERY 8 HOURS AS NEEDED FOR SEVERE PAIN. 90 tablet 0   No current facility-administered medications on file prior to visit.      ROS see history of present illness  Objective  Physical Exam Vitals:   02/06/21 0857 02/06/21 0922  BP: 138/80 140/80  Pulse: 79   Temp: 97.7 F (36.5 C)   SpO2: 95%     BP Readings from Last 3 Encounters:  02/06/21 140/80  07/23/20 (!) 155/83  04/22/20 138/70   Wt Readings from Last 3 Encounters:  02/06/21 202 lb 9.6 oz (91.9 kg)  11/06/20 203 lb (92.1 kg)  07/23/20 203 lb (92.1 kg)    Physical Exam Constitutional:      General: She is not in acute distress.    Appearance: She is not diaphoretic.  Cardiovascular:     Rate and Rhythm: Normal rate and regular rhythm.     Heart sounds: Normal heart sounds.  Pulmonary:     Effort: Pulmonary effort is normal.     Breath sounds: Normal breath sounds.  Skin:    General: Skin is warm and dry.  Neurological:     Mental Status: She is alert.      Assessment/Plan: Please see individual problem list.  Problem List Items Addressed This Visit    RESTLESS LEG SYNDROME    Clonazepam seems to be beneficial for this.  We will check a ferritin level to see if she needs an iron supplement.  HYPERTENSION, BENIGN - Primary    Adequate control for age.  She will continue amlodipine 5 mg once daily and lisinopril 40 mg once daily.  Check BMP.      Relevant Orders   Comp Met (CMET)   Hyperlipidemia    Check lipid panel.      Relevant Orders   Lipid panel   Anxiety and depression    Generally well controlled.  She will continue clonazepam as needed as prescribed.      Trochanteric bursitis of left hip    Chronic ongoing issue.  She can continue tramadol on an as-needed basis to help with her pain.  Monitor for drowsiness.  She will monitor.      Prediabetes    Check A1c.      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  Discussed that she may not feel a lump  related to this.  Discussed the risk that this could spread throughout her body if not treated.  The patient notes she does not want to proceed with any further management or evaluation for this.  She notes she does not have transportation and does not have anybody that would be able to help care for her at home after having treatment or biopsy.  She understands the risk of cancer related to the prior finding.      History of anemia    History of this in the past.  Most recent CBC with normal hemoglobin.  We will check a ferritin level.      Relevant Orders   Ferritin     Return in about 3 months (around 05/09/2021) for Anxiety/restless leg syndrome/chronic pain.  This visit occurred during the SARS-CoV-2 public health emergency.  Safety protocols were in place, including screening questions prior to the visit, additional usage of staff PPE, and extensive cleaning of exam room while observing appropriate contact time as indicated for disinfecting solutions.    [name], MD [place]

## 2021-02-06 NOTE — Assessment & Plan Note (Signed)
Chronic ongoing issue.  She can continue tramadol on an as-needed basis to help with her pain.  Monitor for drowsiness.  She will monitor.

## 2021-02-06 NOTE — Assessment & Plan Note (Signed)
Check lipid panel  

## 2021-02-06 NOTE — Assessment & Plan Note (Signed)
History of this in the past.  Most recent CBC with normal hemoglobin.  We will check a ferritin level.

## 2021-02-06 NOTE — Assessment & Plan Note (Signed)
I had a discussion with the patient regarding follow-up imaging or biopsy given her prior abnormal mammogram and the concern for DCIS.  Discussed that she may not feel a lump related to this.  Discussed the risk that this could spread throughout her body if not treated.  The patient notes she does not want to proceed with any further management or evaluation for this.  She notes she does not have transportation and does not have anybody that would be able to help care for her at home after having treatment or biopsy.  She understands the risk of cancer related to the prior finding.

## 2021-02-06 NOTE — Assessment & Plan Note (Signed)
Generally well controlled.  She will continue clonazepam as needed as prescribed.

## 2021-02-06 NOTE — Assessment & Plan Note (Signed)
Clonazepam seems to be beneficial for this.  We will check a ferritin level to see if she needs an iron supplement.

## 2021-02-06 NOTE — Assessment & Plan Note (Signed)
Check A1c. 

## 2021-02-06 NOTE — Assessment & Plan Note (Signed)
Adequate control for age.  She will continue amlodipine 5 mg once daily and lisinopril 40 mg once daily.  Check BMP.

## 2021-02-07 ENCOUNTER — Encounter: Payer: Self-pay | Admitting: Family Medicine

## 2021-02-07 NOTE — Telephone Encounter (Signed)
This has been updated.

## 2021-02-11 ENCOUNTER — Encounter: Payer: Self-pay | Admitting: Family Medicine

## 2021-02-12 NOTE — Telephone Encounter (Signed)
Reviewed

## 2021-02-13 ENCOUNTER — Ambulatory Visit (INDEPENDENT_AMBULATORY_CARE_PROVIDER_SITE_OTHER): Payer: Medicare Other | Admitting: Podiatry

## 2021-02-13 ENCOUNTER — Encounter: Payer: Self-pay | Admitting: Podiatry

## 2021-02-13 ENCOUNTER — Other Ambulatory Visit: Payer: Self-pay

## 2021-02-13 DIAGNOSIS — M79676 Pain in unspecified toe(s): Secondary | ICD-10-CM

## 2021-02-13 DIAGNOSIS — B351 Tinea unguium: Secondary | ICD-10-CM | POA: Diagnosis not present

## 2021-02-13 DIAGNOSIS — Q828 Other specified congenital malformations of skin: Secondary | ICD-10-CM

## 2021-02-13 NOTE — Progress Notes (Signed)
This patient returns to my office for at risk foot care.  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  This patient is unable to cut nails herself since the patient cannot reach her nails.These nails are painful walking and wearing shoes.  Patient has forefoot callus left foot which she is wearing dispersion padding. This patient presents for at risk foot care today.  General Appearance  Alert, conversant and in no acute stress.  Vascular  Dorsalis pedis and posterior tibial  pulses are palpable  bilaterally.  Capillary return is within normal limits  bilaterally. Temperature is within normal limits  bilaterally.  Neurologic  Senn-Weinstein monofilament wire test within normal limits  bilaterally. Muscle power within normal limits bilaterally.  Nails Thick disfigured discolored nails with subungual debris  Hallux bilaterally.. No evidence of bacterial infection or drainage bilaterally.  Orthopedic  No limitations of motion  feet .  No crepitus or effusions noted.  No bony pathology or digital deformities noted.  Skin  normotropic skin  noted bilaterally.  No signs of infections or ulcers noted.   Porokeratosis sub 2 left foot.  Onychomycosis  Pain in right toes  Pain in left toes  Porokeratosis  Left forefoot.  Consent was obtained for treatment procedures.   Mechanical debridement of nails 1-5  bilaterally performed with a nail nipper.  Filed with dremel without incident.  Debride callus with # 15 blade.    Return office visit   10 weeks                   Told patient to return for periodic foot care and evaluation due to potential at risk complications.   Gardiner Barefoot DPM

## 2021-02-14 ENCOUNTER — Ambulatory Visit: Payer: Medicare Other | Admitting: Family Medicine

## 2021-02-17 ENCOUNTER — Encounter: Payer: Self-pay | Admitting: Family Medicine

## 2021-02-19 ENCOUNTER — Ambulatory Visit: Payer: Medicare Other

## 2021-03-03 ENCOUNTER — Ambulatory Visit (INDEPENDENT_AMBULATORY_CARE_PROVIDER_SITE_OTHER): Payer: Medicare Other | Admitting: Family Medicine

## 2021-03-03 ENCOUNTER — Ambulatory Visit: Payer: Medicare Other | Admitting: Family Medicine

## 2021-03-03 ENCOUNTER — Encounter: Payer: Self-pay | Admitting: Family Medicine

## 2021-03-03 ENCOUNTER — Other Ambulatory Visit: Payer: Self-pay

## 2021-03-03 DIAGNOSIS — D3502 Benign neoplasm of left adrenal gland: Secondary | ICD-10-CM | POA: Diagnosis not present

## 2021-03-03 DIAGNOSIS — M7062 Trochanteric bursitis, left hip: Secondary | ICD-10-CM | POA: Diagnosis not present

## 2021-03-03 DIAGNOSIS — F419 Anxiety disorder, unspecified: Secondary | ICD-10-CM

## 2021-03-03 DIAGNOSIS — I10 Essential (primary) hypertension: Secondary | ICD-10-CM

## 2021-03-03 DIAGNOSIS — F32A Depression, unspecified: Secondary | ICD-10-CM | POA: Diagnosis not present

## 2021-03-03 MED ORDER — SERTRALINE HCL 50 MG PO TABS
ORAL_TABLET | ORAL | 3 refills | Status: DC
Start: 2021-03-03 — End: 2021-03-05

## 2021-03-03 NOTE — Progress Notes (Signed)
[name], MD Phone: [phone]  Toni Berger is a 75 y.o. female who presents today for f/u.  HYPERTENSION Disease Monitoring Home BP Monitoring checking on her home cuffs and has been getting elevated blood pressures.  She brought her cuff in today and it read 157/80 with our in office reading being 130/80. Chest pain-no.    Dyspnea-no. Medications Compliance-taking amlodipine, lisinopril.   Edema-improved  Restless leg/chronic leg pain: Patient notes her legs seem to be doing better overall.  She is able to get up without significant discomfort or difficulty now.  She had an issue where she was having some pains in her head and neck several weeks ago and she notes she took a tramadol and then went to sleep after taking a shower and her legs were better after that.  She has had no further issues with the head or neck pain.  Anxiety: Patient notes her anxiety has been up with the blood pressure machine issues.  She notes some depression.  No SI.  She notes the clonazepam generally helps with the anxiety as well as restless legs.  She is interested in an SSRI to help with this issue.  She wonders if wearing a sleep mask would be helpful for preventing the corner of her pillow poking the outer edge of her right thigh.   Social History   Tobacco Use  Smoking Status Former   Pack years: 0.00  Smokeless Tobacco Never    Current Outpatient Medications on File Prior to Visit  Medication Sig Dispense Refill   amLODipine (NORVASC) 5 MG tablet TAKE 1 TABLET BY MOUTH EVERY DAY 90 tablet 1   clonazePAM (KLONOPIN) 1 MG tablet TAKE 1 TABLET BY MOUTH EVERY 8 TO 12 HOURS AS NEEDED FOR ANXIETY (MAX 3 DOSES IN 24 HOURS) 90 tablet 0   clopidogrel (PLAVIX) 75 MG tablet TAKE 1 TABLET BY MOUTH EVERY DAY 90 tablet 3   cyanocobalamin 1000 MCG tablet Take 1,000 mcg by mouth daily.     lisinopril (ZESTRIL) 40 MG tablet TAKE 1 TABLET BY MOUTH EVERY DAY 90 tablet 3   traMADol (ULTRAM) 50 MG tablet  TAKE 1 TABLET BY MOUTH EVERY 8 HOURS AS NEEDED FOR SEVERE PAIN. 90 tablet 0   No current facility-administered medications on file prior to visit.     ROS see history of present illness  Objective  Physical Exam Vitals:   03/03/21 0933  BP: 130/80  Pulse: 61  Temp: 98.2 F (36.8 C)  SpO2: 99%    BP Readings from Last 3 Encounters:  03/03/21 130/80  02/06/21 140/80  07/23/20 (!) 155/83   Wt Readings from Last 3 Encounters:  03/03/21 201 lb 9.6 oz (91.4 kg)  02/06/21 202 lb 9.6 oz (91.9 kg)  11/06/20 203 lb (92.1 kg)    Physical Exam Constitutional:      General: She is not in acute distress.    Appearance: She is not diaphoretic.  Cardiovascular:     Rate and Rhythm: Normal rate and regular rhythm.     Heart sounds: Normal heart sounds.  Pulmonary:     Effort: Pulmonary effort is normal.     Breath sounds: Normal breath sounds.  Skin:    General: Skin is warm and dry.  Neurological:     Mental Status: She is alert.     Comments: The patient is easily able to rise from a seated position.     Assessment/Plan: Please see individual problem list.  Problem List Items  Addressed This Visit     HYPERTENSION, BENIGN    Well-controlled on the readings today in the office.  This was repeated and she had a similar reading.  She will continue amlodipine and 5 mg once daily and lisinopril 40 mg daily.  Advised not to check her blood pressure at home as this is excessively anxiety provoking.       Anxiety and depression    Worsened recently.  We will start Zoloft 25 mg once daily.  Discussed risk of sleep issues and weight gain.  She can continue clonazepam as prescribed.  Discussed risk of dementia with chronic benzodiazepine use.       Relevant Medications   sertraline (ZOLOFT) 50 MG tablet   Adrenal adenoma, left    Discussed that follow-up imaging was recommended for this previous finding.  I advised that the finding of a follow-up scan would allow [place] to see if  this lesion had grown which could indicate a possible cancerous process versus if it was stable which would indicate a benign process.  Discussed that there would likely be no symptoms if it had enlarged.  She notes she would not have a surgery or a biopsy so she does not see the point in having the scan done.  Given that she would not undergo an intervention for this I agree that follow-up imaging is to be of little use and thus this will be deferred based on her wishes.       Trochanteric bursitis of left hip    Seems to be quite a bit improved.  She can continue tramadol as needed as prescribed.      I discussed that she could wear an eye mask at night to see if that would help with her issue as outlined above.  Return in about 2 months (around 05/03/2021) for Anxiety/depression.  This visit occurred during the SARS-CoV-2 public health emergency.  Safety protocols were in place, including screening questions prior to the visit, additional usage of staff PPE, and extensive cleaning of exam room while observing appropriate contact time as indicated for disinfecting solutions.   I have spent 45 minutes in the care of this patient regarding history taking, documentation, discussion of plan, counseling regarding imaging to follow-up on her left adrenal adenoma, counseling on Zoloft use.   [name], MD [place]

## 2021-03-03 NOTE — Assessment & Plan Note (Signed)
Worsened recently.  We will start Zoloft 25 mg once daily.  Discussed risk of sleep issues and weight gain.  She can continue clonazepam as prescribed.  Discussed risk of dementia with chronic benzodiazepine use.

## 2021-03-03 NOTE — Assessment & Plan Note (Signed)
Well-controlled on the readings today in the office.  This was repeated and she had a similar reading.  She will continue amlodipine and 5 mg once daily and lisinopril 40 mg daily.  Advised not to check her blood pressure at home as this is excessively anxiety provoking.

## 2021-03-03 NOTE — Assessment & Plan Note (Signed)
Seems to be quite a bit improved.  She can continue tramadol as needed as prescribed.

## 2021-03-03 NOTE — Assessment & Plan Note (Signed)
Discussed that follow-up imaging was recommended for this previous finding.  I advised that the finding of a follow-up scan would allow [place] to see if this lesion had grown which could indicate a possible cancerous process versus if it was stable which would indicate a benign process.  Discussed that there would likely be no symptoms if it had enlarged.  She notes she would not have a surgery or a biopsy so she does not see the point in having the scan done.  Given that she would not undergo an intervention for this I agree that follow-up imaging is to be of little use and thus this will be deferred based on her wishes.

## 2021-03-03 NOTE — Patient Instructions (Signed)
Nice to see you. We will start you on Zoloft.  If you notice any of the side effects we discussed please let me know.

## 2021-03-04 ENCOUNTER — Other Ambulatory Visit: Payer: Self-pay | Admitting: Family Medicine

## 2021-03-04 NOTE — Telephone Encounter (Signed)
PT called into the office today to see if it be at all possible for the medicine to be called in today as she is going to return the medicine today at the Rx.

## 2021-03-05 MED ORDER — SERTRALINE HCL 25 MG PO TABS: 25.0000 mg | ORAL_TABLET | Freq: Every day | ORAL | 1 refills | Status: DC

## 2021-03-06 NOTE — Telephone Encounter (Signed)
PT is going to stop taking the sertraline due to the symptoms she is having. PT can not answer mychart msgs atm because of the headache she is having. PT would like to be called instead of msg through mychart if she is needed for anything else.

## 2021-03-06 NOTE — Telephone Encounter (Signed)
Noted  

## 2021-03-07 ENCOUNTER — Encounter: Payer: Self-pay | Admitting: Family Medicine

## 2021-03-10 NOTE — Telephone Encounter (Signed)
Please call the patient and see if the headache resolved with stopping the sertraline. Thanks.

## 2021-03-27 ENCOUNTER — Encounter: Payer: Self-pay | Admitting: Family Medicine

## 2021-03-31 ENCOUNTER — Other Ambulatory Visit: Payer: Self-pay | Admitting: Family Medicine

## 2021-03-31 NOTE — Telephone Encounter (Signed)
RX Refill:klonopin and tramadol Last Seen:03-03-21 Last ordered:03-05-21

## 2021-04-15 DIAGNOSIS — Z20822 Contact with and (suspected) exposure to covid-19: Secondary | ICD-10-CM | POA: Diagnosis not present

## 2021-04-28 ENCOUNTER — Other Ambulatory Visit: Payer: Self-pay | Admitting: Family Medicine

## 2021-05-12 ENCOUNTER — Ambulatory Visit (INDEPENDENT_AMBULATORY_CARE_PROVIDER_SITE_OTHER): Payer: Medicare Other | Admitting: Family Medicine

## 2021-05-12 ENCOUNTER — Encounter: Payer: Self-pay | Admitting: Family Medicine

## 2021-05-12 ENCOUNTER — Telehealth: Payer: Self-pay | Admitting: Family Medicine

## 2021-05-12 ENCOUNTER — Other Ambulatory Visit: Payer: Self-pay

## 2021-05-12 DIAGNOSIS — M79605 Pain in left leg: Secondary | ICD-10-CM | POA: Insufficient documentation

## 2021-05-12 DIAGNOSIS — M79604 Pain in right leg: Secondary | ICD-10-CM | POA: Insufficient documentation

## 2021-05-12 DIAGNOSIS — F32A Depression, unspecified: Secondary | ICD-10-CM | POA: Diagnosis not present

## 2021-05-12 DIAGNOSIS — I872 Venous insufficiency (chronic) (peripheral): Secondary | ICD-10-CM

## 2021-05-12 DIAGNOSIS — F419 Anxiety disorder, unspecified: Secondary | ICD-10-CM

## 2021-05-12 NOTE — Telephone Encounter (Signed)
Patient was in earlier today and she forgot to ask Dr.Sonnenberg if it is ok to still eat eggs.She heard that there is a disease going on with chickens so she is unsure if she should eat them and would like a call back with more information.

## 2021-05-12 NOTE — Assessment & Plan Note (Signed)
Generally stable.  She did not tolerate Zoloft.  We will continue with clonazepam as she does not want to try additional at this time.  That is reasonable as she is relatively stable.  She will monitor her symptoms.

## 2021-05-12 NOTE — Assessment & Plan Note (Signed)
I suspect venous insufficiency is playing some role in the swelling and discomfort.  Discussed elevation of legs though she notes that tends to make her legs hurt more.  Discussed compression stockings though that makes her legs hurt more as well.  I also offered topical corticosteroid for her venous stasis dermatitis skin changes though she declines that and opts to stick with topical steroid.

## 2021-05-12 NOTE — Telephone Encounter (Signed)
Patient was in earlier today and she forgot to ask Dr.Sonnenberg if it is ok to still eat eggs.She heard that there is a disease going on with chickens so she is unsure if she should eat them and would like a call back with more information. Jonanthony Nahar,cma

## 2021-05-12 NOTE — Assessment & Plan Note (Signed)
This is a chronic issue.  Tramadol seems to be beneficial and that she should continue this.

## 2021-05-12 NOTE — Patient Instructions (Signed)
Nice to see you. Please continue with your clonazepam and tramadol.  If you develop drowsiness please let us know.  Please do not drive if you are drowsy.

## 2021-05-12 NOTE — Progress Notes (Signed)
[name], MD Phone: [phone]  Toni Berger is a 75 y.o. female who presents today for follow-up.  Anxiety/depression: Patient notes her anxiety is middling.  She notes occasional depression but not all the time.  She notes no SI.  She did not like Zoloft as it caused a headache that lasted 2 to 3 days.  She discontinued this.  She continues on clonazepam and typically will take 2-3 doses per day though they are concentrated more at night.  There is no drowsiness with the clonazepam.  Chronic leg pain: This is an ongoing issue.  She takes the tramadol 2-3 times a day.  It does not make her drowsy.  She has intermittent issues with leg swelling and redness in her lower legs.  The area of the redness does not itch though there is burning.  She tried topical steroids in the past though she has a lotion at home that works just as well.  Social History   Tobacco Use  Smoking Status Former  Smokeless Tobacco Never    Current Outpatient Medications on File Prior to Visit  Medication Sig Dispense Refill   amLODipine (NORVASC) 5 MG tablet TAKE 1 TABLET BY MOUTH EVERY DAY 90 tablet 1   clonazePAM (KLONOPIN) 1 MG tablet TAKE 1 TABLET BY MOUTH EVERY 8 TO 12 HOURS AS NEEDED FOR ANXIETY (MAX 3 DOSES IN 24 HOURS) 90 tablet 0   clopidogrel (PLAVIX) 75 MG tablet TAKE 1 TABLET BY MOUTH EVERY DAY 90 tablet 3   cyanocobalamin 1000 MCG tablet Take 1,000 mcg by mouth daily.     lisinopril (ZESTRIL) 40 MG tablet TAKE 1 TABLET BY MOUTH EVERY DAY 90 tablet 3   sertraline (ZOLOFT) 25 MG tablet Take 1 tablet (25 mg total) by mouth daily. 90 tablet 1   traMADol (ULTRAM) 50 MG tablet TAKE 1 TABLET BY MOUTH EVERY 8 HOURS AS NEEDED FOR SEVERE PAIN. 90 tablet 0   No current facility-administered medications on file prior to visit.     ROS see history of present illness  Objective  Physical Exam Vitals:   05/12/21 0915  BP: 120/60  Pulse: 80  Temp: 98.6 F (37 C)  SpO2: 97%    BP Readings  from Last 3 Encounters:  05/12/21 120/60  03/03/21 130/80  02/06/21 140/80   Wt Readings from Last 3 Encounters:  05/12/21 203 lb 9.6 oz (92.4 kg)  03/03/21 201 lb 9.6 oz (91.4 kg)  02/06/21 202 lb 9.6 oz (91.9 kg)    Physical Exam Constitutional:      General: She is not in acute distress.    Appearance: She is not diaphoretic.  Cardiovascular:     Rate and Rhythm: Normal rate and regular rhythm.     Heart sounds: Normal heart sounds.  Pulmonary:     Effort: Pulmonary effort is normal.     Breath sounds: Normal breath sounds.  Musculoskeletal:     Comments: Trace edema bilateral lower extremities, venous stasis dermatitis changes in her bilateral lower legs  Skin:    General: Skin is warm and dry.  Neurological:     Mental Status: She is alert.     Assessment/Plan: Please see individual problem list.  Problem List Items Addressed This Visit     Anxiety and depression    Generally stable.  She did not tolerate Zoloft.  We will continue with clonazepam as she does not want to try additional at this time.  That is reasonable as she is relatively  stable.  She will monitor her symptoms.      Bilateral leg pain    This is a chronic issue.  Tramadol seems to be beneficial and that she should continue this.        Venous insufficiency of both lower extremities    I suspect venous insufficiency is playing some role in the swelling and discomfort.  Discussed elevation of legs though she notes that tends to make her legs hurt more.  Discussed compression stockings though that makes her legs hurt more as well.  I also offered topical corticosteroid for her venous stasis dermatitis skin changes though she declines that and opts to stick with topical steroid.       Return in about 3 months (around 08/12/2021).  This visit occurred during the SARS-CoV-2 public health emergency.  Safety protocols were in place, including screening questions prior to the visit, additional usage of  staff PPE, and extensive cleaning of exam room while observing appropriate contact time as indicated for disinfecting solutions.    [name], MD [place]

## 2021-05-13 NOTE — Telephone Encounter (Signed)
Based on review of the CDC website it is safe to eat properly handled uncooked poultry in the [place].  She should avoid direct contact with wild birds and domesticated and wild Sales executive.

## 2021-05-15 NOTE — Telephone Encounter (Signed)
I called the patient and she's stated she found the information needed and she understood.  Knox Cervi,cma

## 2021-05-22 ENCOUNTER — Ambulatory Visit: Payer: Medicare Other | Admitting: Podiatry

## 2021-05-26 ENCOUNTER — Other Ambulatory Visit: Payer: Self-pay | Admitting: Family Medicine

## 2021-06-01 ENCOUNTER — Encounter: Payer: Self-pay | Admitting: Family Medicine

## 2021-06-10 ENCOUNTER — Encounter: Payer: Self-pay | Admitting: Family Medicine

## 2021-06-11 NOTE — Telephone Encounter (Signed)
Patient is calling in to ask a question,informed patient that her form has been added to her chart.Please call her at [phone].

## 2021-06-12 ENCOUNTER — Ambulatory Visit: Payer: Medicare Other | Admitting: Podiatry

## 2021-06-26 ENCOUNTER — Telehealth: Payer: Self-pay | Admitting: Family Medicine

## 2021-06-26 MED ORDER — AMLODIPINE BESYLATE 5 MG PO TABS: 5.0000 mg | ORAL_TABLET | Freq: Every day | ORAL | 1 refills | Status: DC

## 2021-06-26 MED ORDER — TRAMADOL HCL 50 MG PO TABS: ORAL_TABLET | ORAL | 0 refills | Status: DC

## 2021-06-26 MED ORDER — CLONAZEPAM 1 MG PO TABS: ORAL_TABLET | ORAL | 0 refills | Status: DC

## 2021-06-26 MED ORDER — CLOPIDOGREL BISULFATE 75 MG PO TABS: 75.0000 mg | ORAL_TABLET | Freq: Every day | ORAL | 3 refills | Status: DC

## 2021-06-26 MED ORDER — LISINOPRIL 40 MG PO TABS: 40.0000 mg | ORAL_TABLET | Freq: Every day | ORAL | 3 refills | Status: DC

## 2021-06-26 NOTE — Telephone Encounter (Signed)
PDMP reviewed.  Rx sent in for clonazepam and tramadol.

## 2021-06-26 NOTE — Telephone Encounter (Signed)
Patient calling back in and states she is having a bad experience with [facility].   Patient is switching pharmacies to [facility]. Verified this with the Patient and sent in her non-controlled medications to this pharmacy.   Pended for your approval or denial.  Patient states she is out of her controlled medications and will be out before Dr Caryl Bis gets back on Monday.

## 2021-06-26 NOTE — Telephone Encounter (Signed)
Patient is calling to request a refill on her amLODipine (NORVASC) 5 MG tablet,clonazePAM (KLONOPIN) 1 MG tablet and traMADol (ULTRAM) 50 MG tablet.[facility] to [facility] on [address]

## 2021-07-07 ENCOUNTER — Other Ambulatory Visit: Payer: Self-pay

## 2021-07-07 ENCOUNTER — Encounter: Payer: Self-pay | Admitting: Podiatry

## 2021-07-07 ENCOUNTER — Ambulatory Visit (INDEPENDENT_AMBULATORY_CARE_PROVIDER_SITE_OTHER): Payer: Medicare Other | Admitting: Podiatry

## 2021-07-07 DIAGNOSIS — B351 Tinea unguium: Secondary | ICD-10-CM | POA: Diagnosis not present

## 2021-07-07 DIAGNOSIS — Q828 Other specified congenital malformations of skin: Secondary | ICD-10-CM

## 2021-07-07 DIAGNOSIS — D689 Coagulation defect, unspecified: Secondary | ICD-10-CM | POA: Diagnosis not present

## 2021-07-07 DIAGNOSIS — M79676 Pain in unspecified toe(s): Secondary | ICD-10-CM

## 2021-07-07 NOTE — Progress Notes (Signed)
This patient returns to my office for at risk foot care.  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  This patient is unable to cut nails herself since the patient cannot reach her nails.These nails are painful walking and wearing shoes.  Patient has forefoot callus left foot which she is wearing dispersion padding. This patient presents for at risk foot care today.  General Appearance  Alert, conversant and in no acute stress.  Vascular  Dorsalis pedis and posterior tibial  pulses are palpable  bilaterally.  Capillary return is within normal limits  bilaterally. Temperature is within normal limits  bilaterally.  Neurologic  Senn-Weinstein monofilament wire test within normal limits  bilaterally. Muscle power within normal limits bilaterally.  Nails Thick disfigured discolored nails with subungual debris  Hallux bilaterally.. No evidence of bacterial infection or drainage bilaterally.  Orthopedic  No limitations of motion  feet .  No crepitus or effusions noted.  No bony pathology or digital deformities noted.  Skin  normotropic skin  noted bilaterally.  No signs of infections or ulcers noted.   Porokeratosis sub 2 left foot.  Onychomycosis  Pain in right toes  Pain in left toes  Porokeratosis  Left forefoot.  Consent was obtained for treatment procedures.   Mechanical debridement of nails 1-5  bilaterally performed with a nail nipper.  Filed with dremel without incident.  Debride callus with # 15 blade.    Return office visit   10 weeks                   Told patient to return for periodic foot care and evaluation due to potential at risk complications.   Gardiner Barefoot DPM

## 2021-07-21 ENCOUNTER — Other Ambulatory Visit: Payer: Self-pay | Admitting: Internal Medicine

## 2021-07-21 NOTE — Telephone Encounter (Signed)
RX Refill: klonopin and tramadol Last Seen: 05-12-21 Last Ordered: 06-26-21 Next Appt: 08-12-21

## 2021-08-12 ENCOUNTER — Ambulatory Visit: Payer: Medicare Other | Admitting: Family Medicine

## 2021-08-22 ENCOUNTER — Other Ambulatory Visit: Payer: Self-pay | Admitting: Family Medicine

## 2021-08-25 NOTE — Telephone Encounter (Signed)
RX Refill: Tramadol and klonopin Last Seen: 05-12-21 Last Ordered: 07-21-21 Next Appt: 10-27-21

## 2021-08-25 NOTE — Telephone Encounter (Signed)
Patient called about the refill request for her Tramadol and Klonopin.

## 2021-08-30 DIAGNOSIS — Z20828 Contact with and (suspected) exposure to other viral communicable diseases: Secondary | ICD-10-CM | POA: Diagnosis not present

## 2021-09-19 ENCOUNTER — Encounter: Payer: Self-pay | Admitting: Family Medicine

## 2021-09-19 ENCOUNTER — Telehealth: Payer: Self-pay | Admitting: Family Medicine

## 2021-09-19 NOTE — Telephone Encounter (Signed)
Last OV 05/12/21 last fill 08/25/21 okt to fill clonopin and Tramadol?

## 2021-09-22 NOTE — Telephone Encounter (Signed)
I called the patient and informed her that she was asking for the Tramadol refill to soon and she can get it tomorrow and she understood.  Patient wanted to know should she stay on Plavix because they took her off for a few days in the hospital and Dr. Caryl Bis started it back and she stated when she sits down for a second she immediately falls asleep.  Should she come off the Plavix or stay on it.  Ayaan Ringle,cma

## 2021-09-22 NOTE — Telephone Encounter (Signed)
Pt called in stating the insurance was not going to fill medication as prescribed so pt called medicare.gov and they said the medication should be filled as prescribed by doctor and if not to let them know so they can reach out to the pharmacy. The pharmacy was only going to fill one week and then wait to fill the rest.

## 2021-09-23 NOTE — Telephone Encounter (Signed)
Pt called stating to call [facility] and ask them is it a problems to pick the two medications up tomorrow for 90 tablets for one month supply.

## 2021-09-25 NOTE — Telephone Encounter (Signed)
He message to stay on Plavix was sent to the patient on mychart. Aahana Elza,cma

## 2021-10-10 ENCOUNTER — Encounter: Payer: Self-pay | Admitting: Family Medicine

## 2021-10-10 NOTE — Telephone Encounter (Signed)
She needs an appointment to discuss this to help determine appropriate treatment and if any further evaluation is needed.

## 2021-10-10 NOTE — Telephone Encounter (Signed)
Patient called in requesting to speak with Gae Bon, reached Gae Bon unavailable but stated received note and will  speak with  Dr and call patient back

## 2021-10-16 ENCOUNTER — Ambulatory Visit: Payer: Medicare Other | Admitting: Podiatry

## 2021-10-17 ENCOUNTER — Encounter: Payer: Self-pay | Admitting: Family Medicine

## 2021-10-21 ENCOUNTER — Other Ambulatory Visit: Payer: Self-pay | Admitting: Family

## 2021-10-21 ENCOUNTER — Encounter: Payer: Self-pay | Admitting: Family Medicine

## 2021-10-21 ENCOUNTER — Telehealth: Payer: Self-pay | Admitting: Family Medicine

## 2021-10-21 ENCOUNTER — Ambulatory Visit (INDEPENDENT_AMBULATORY_CARE_PROVIDER_SITE_OTHER): Payer: Medicare Other

## 2021-10-21 ENCOUNTER — Ambulatory Visit (INDEPENDENT_AMBULATORY_CARE_PROVIDER_SITE_OTHER): Payer: Medicare Other | Admitting: Family Medicine

## 2021-10-21 ENCOUNTER — Other Ambulatory Visit: Payer: Self-pay

## 2021-10-21 ENCOUNTER — Other Ambulatory Visit: Payer: Self-pay | Admitting: Family Medicine

## 2021-10-21 VITALS — BP 130/70 | HR 91 | Temp 98.7°F | Ht 67.0 in | Wt 205.2 lb

## 2021-10-21 DIAGNOSIS — I10 Essential (primary) hypertension: Secondary | ICD-10-CM

## 2021-10-21 DIAGNOSIS — F419 Anxiety disorder, unspecified: Secondary | ICD-10-CM

## 2021-10-21 DIAGNOSIS — R079 Chest pain, unspecified: Secondary | ICD-10-CM

## 2021-10-21 DIAGNOSIS — K449 Diaphragmatic hernia without obstruction or gangrene: Secondary | ICD-10-CM | POA: Diagnosis not present

## 2021-10-21 DIAGNOSIS — F32A Depression, unspecified: Secondary | ICD-10-CM

## 2021-10-21 DIAGNOSIS — R0602 Shortness of breath: Secondary | ICD-10-CM | POA: Diagnosis not present

## 2021-10-21 DIAGNOSIS — D72829 Elevated white blood cell count, unspecified: Secondary | ICD-10-CM

## 2021-10-21 LAB — CBC
HCT: 37.4 % (ref 36.0–46.0)
Hemoglobin: 12 g/dL (ref 12.0–15.0)
MCHC: 32.2 g/dL (ref 30.0–36.0)
MCV: 84.2 fl (ref 78.0–100.0)
Platelets: 371 10*3/uL (ref 150.0–400.0)
RBC: 4.44 Mil/uL (ref 3.87–5.11)
RDW: 14.6 % (ref 11.5–15.5)
WBC: 19.3 10*3/uL (ref 4.0–10.5)

## 2021-10-21 LAB — COMPREHENSIVE METABOLIC PANEL
ALT: 13 U/L (ref 0–35)
AST: 13 U/L (ref 0–37)
Albumin: 4.1 g/dL (ref 3.5–5.2)
Alkaline Phosphatase: 95 U/L (ref 39–117)
BUN: 27 mg/dL — ABNORMAL HIGH (ref 6–23)
CO2: 29 mEq/L (ref 19–32)
Calcium: 9.6 mg/dL (ref 8.4–10.5)
Chloride: 101 mEq/L (ref 96–112)
Creatinine, Ser: 0.86 mg/dL (ref 0.40–1.20)
GFR: 65.95 mL/min (ref >60.00)
Glucose, Bld: 67 mg/dL — ABNORMAL LOW (ref 70–99)
Potassium: 4.3 mEq/L (ref 3.5–5.1)
Sodium: 135 mEq/L (ref 135–145)
Total Bilirubin: 0.6 mg/dL (ref 0.2–1.2)
Total Protein: 7.2 g/dL (ref 6.0–8.3)

## 2021-10-21 MED ORDER — PANTOPRAZOLE SODIUM 40 MG PO TBEC: 40.0000 mg | DELAYED_RELEASE_TABLET | Freq: Every day | ORAL | 0 refills | Status: DC

## 2021-10-21 NOTE — Assessment & Plan Note (Addendum)
She reports this is stable.  She can continue her clonazepam.  I advised that she needs to space this out by 8 to 12 hours.  She will monitor for drowsiness with this.

## 2021-10-21 NOTE — Telephone Encounter (Signed)
CRITICAL VALUE STICKER  CRITICAL VALUE: Critical White Count 19.3  RECEIVER (on-site recipient of call):Hannahgrace Lalli  DATE & TIME NOTIFIED: 10/21/2021 2:38pm  MESSENGER (representative from lab):Karen  MD NOTIFIED: Dr Nicki Reaper  TIME OF NOTIFICATION: 2:40pm  RESPONSE:

## 2021-10-21 NOTE — Progress Notes (Signed)
[name], MD Phone: [phone]  Toni Berger is a 76 y.o. female who presents today for follow-up.  Anxiety: Generally stable.  She takes clonazepam mostly at night.  She notes stable depression.  No SI.  She said a lot of stress with her house.  Hypertension: Not checking blood pressures.  She is taking amlodipine and lisinopril.  See below for discussion of chest pain and shortness of breath.  Reflux/chest pain/shortness of breath: Patient notes the first episode of this occurred on 10/08/2021.  It occurred while she was laying down.  She has been waking up coughing in her sleep.  She does note some dysphagia when she swallows food.  She notes she gets a discomfort in her chest when this occurs and she breaks out in a sweat.  She has some shortness of breath with this.  When it occurs something feels like it is coming up in her throat.  She does not lay flat with this.  She notes Tums helps.  She had an EGD on 03/30/2019 4 GI bleed and this revealed two 5 mm angioectasias with bleeding and a few dispersed small nonbleeding erosions.  She had a 1 cm hiatal hernia.  She also had esophagitis.  She has noted no blood in her stool.  Social History   Tobacco Use  Smoking Status Former  Smokeless Tobacco Never    Current Outpatient Medications on File Prior to Visit  Medication Sig Dispense Refill   amLODipine (NORVASC) 5 MG tablet Take 1 tablet (5 mg total) by mouth daily. 90 tablet 1   clonazePAM (KLONOPIN) 1 MG tablet TAKE 1 TABLET BY MOUTH EVERY 8 TO 12 HOURS AS NEEDED. MAXIMUM 3 DOSES IN 24 HOURS 90 tablet 0   clopidogrel (PLAVIX) 75 MG tablet Take 1 tablet (75 mg total) by mouth daily. 90 tablet 3   cyanocobalamin 1000 MCG tablet Take 1,000 mcg by mouth daily.     lisinopril (ZESTRIL) 40 MG tablet Take 1 tablet (40 mg total) by mouth daily. 90 tablet 3   traMADol (ULTRAM) 50 MG tablet TAKE 1 TABLET BY MOUTH EVERY 8 HOURS AS NEEDED FOR SEVERE PAIN 90 tablet 0   No current  facility-administered medications on file prior to visit.     ROS see history of present illness  Objective  Physical Exam Vitals:   10/21/21 0916  BP: 130/70  Pulse: 91  Temp: 98.7 F (37.1 C)  SpO2: 97%    BP Readings from Last 3 Encounters:  10/21/21 130/70  05/12/21 120/60  03/03/21 130/80   Wt Readings from Last 3 Encounters:  10/21/21 205 lb 3.2 oz (93.1 kg)  05/12/21 203 lb 9.6 oz (92.4 kg)  03/03/21 201 lb 9.6 oz (91.4 kg)    Physical Exam Constitutional:      General: She is not in acute distress.    Appearance: She is not diaphoretic.  Cardiovascular:     Rate and Rhythm: Normal rate and regular rhythm.     Heart sounds: Normal heart sounds.  Pulmonary:     Effort: Pulmonary effort is normal.     Breath sounds: Normal breath sounds.  Abdominal:     General: Bowel sounds are normal. There is no distension.     Palpations: Abdomen is soft.     Tenderness: There is no abdominal tenderness.  Skin:    General: Skin is warm and dry.  Neurological:     Mental Status: She is alert.   EKG: Sinus rhythm, no  acute ischemic changes, T wave upright in V1 where it was previously inverted otherwise no significant changes  Assessment/Plan: Please see individual problem list.  Problem List Items Addressed This Visit     Anxiety and depression    She reports this is stable.  She can continue her clonazepam.  I advised that she needs to space this out by 8 to 12 hours.  She will monitor for drowsiness with this.      Chest pain - Primary    I discussed this has a wide differential including pulmonary causes versus a cardiac cause versus a GI cause based on her reported symptoms.  There does appear to be some measure of reflux occurring though cardiac cause is certainly a possibility.  The chest discomfort and shortness of breath and diaphoresis.  EKG will be performed today.  We will get a chest x-ray.  We will start the patient on Protonix.  She has a listed  intolerance to this she reports this is headaches in the setting of taking multiple other medications while she was hospitalized.  She will let us know if she develops any side effects with the Protonix.  I advised a cardiology referral and GI referral for evaluation of this issue though she declines both of those as she notes she would not undergo any procedures to evaluate for a cause of her symptoms.  She understands the risk of missing a clinically significant GI lesion or a significant cardiac causes without further evaluation of those things.  If she has recurrence of significant symptoms or worsening of symptoms she will seek medical attention.      Relevant Orders   EKG 12-Lead (Completed)   CBC   Comp Met (CMET)   DG Chest 2 View   HYPERTENSION, BENIGN    Well-controlled.  She will continue lisinopril 40 mg daily and amlodipine 5 mg daily.        Return in about 3 months (around 01/18/2022).  This visit occurred during the SARS-CoV-2 public health emergency.  Safety protocols were in place, including screening questions prior to the visit, additional usage of staff PPE, and extensive cleaning of exam room while observing appropriate contact time as indicated for disinfecting solutions.   I have spent 41 minutes in the care of this patient regarding history taking, documentation, discussion of plan, review of EKG, placing orders, counseling on possible future work-up.  [name], MD [place]

## 2021-10-21 NOTE — Assessment & Plan Note (Signed)
Well-controlled.  She will continue lisinopril 40 mg daily and amlodipine 5 mg daily. 

## 2021-10-21 NOTE — Patient Instructions (Signed)
Nice to see you. We will try you back on Protonix.  If you have any side effects please let us know. We will get a chest x-ray and lab work today. If you have significant recurrence of symptoms or persistence of symptoms or new symptoms you need to seek medical attention immediately. You need to space out your clonazepam by 8 to 12 hours.

## 2021-10-21 NOTE — Assessment & Plan Note (Addendum)
I discussed this has a wide differential including pulmonary causes versus a cardiac cause versus a GI cause based on her reported symptoms.  There does appear to be some measure of reflux occurring though cardiac cause is certainly a possibility.  The chest discomfort and shortness of breath and diaphoresis.  EKG will be performed today.  We will get a chest x-ray.  We will start the patient on Protonix.  She has a listed intolerance to this she reports this is headaches in the setting of taking multiple other medications while she was hospitalized.  She will let us know if she develops any side effects with the Protonix.  I advised a cardiology referral and GI referral for evaluation of this issue though she declines both of those as she notes she would not undergo any procedures to evaluate for a cause of her symptoms.  She understands the risk of missing a clinically significant GI lesion or a significant cardiac causes without further evaluation of those things.  If she has recurrence of significant symptoms or worsening of symptoms she will seek medical attention.

## 2021-10-22 ENCOUNTER — Encounter: Payer: Self-pay | Admitting: Family Medicine

## 2021-10-22 NOTE — Telephone Encounter (Signed)
Yes patient scheduled the lab with me when I spoke with her earlier. Toni Berger,cma

## 2021-10-22 NOTE — Telephone Encounter (Signed)
I called and spoke with the patient and she stated she has not had an infection and she cannot take prednisone she is allergic.  Toni Berger,cma

## 2021-10-22 NOTE — Addendum Note (Signed)
Addended by: Leone Haven on: 10/22/2021 12:21 PM   Modules accepted: Orders

## 2021-10-22 NOTE — Telephone Encounter (Signed)
Noted. She needs to have this rechecked. It looks like she might be scheduled for labs next week. Please confirm this. Thanks.

## 2021-10-22 NOTE — Telephone Encounter (Signed)
Please call the patient.  Please let her know her white blood cell count was elevated at 19.3.  Has she had any recent infections?  Has she been on prednisone recently?  I would suggest rechecking this either later this week or early next week.  Order placed.

## 2021-10-24 ENCOUNTER — Other Ambulatory Visit (INDEPENDENT_AMBULATORY_CARE_PROVIDER_SITE_OTHER): Payer: Medicare Other

## 2021-10-24 ENCOUNTER — Telehealth: Payer: Self-pay | Admitting: Family

## 2021-10-24 ENCOUNTER — Other Ambulatory Visit: Payer: Self-pay

## 2021-10-24 ENCOUNTER — Encounter: Payer: Self-pay | Admitting: Family Medicine

## 2021-10-24 DIAGNOSIS — D72829 Elevated white blood cell count, unspecified: Secondary | ICD-10-CM

## 2021-10-24 LAB — CBC WITH DIFFERENTIAL/PLATELET
Basophils Absolute: 0.1 10*3/uL (ref 0.0–0.1)
Basophils Relative: 0.7 % (ref 0.0–3.0)
Eosinophils Absolute: 0.3 10*3/uL (ref 0.0–0.7)
Eosinophils Relative: 4.3 % (ref 0.0–5.0)
HCT: 36.7 % (ref 36.0–46.0)
Hemoglobin: 11.9 g/dL — ABNORMAL LOW (ref 12.0–15.0)
Lymphocytes Relative: 19.5 % (ref 12.0–46.0)
Lymphs Abs: 1.5 10*3/uL (ref 0.7–4.0)
MCHC: 32.5 g/dL (ref 30.0–36.0)
MCV: 84.3 fl (ref 78.0–100.0)
Monocytes Absolute: 1 10*3/uL (ref 0.1–1.0)
Monocytes Relative: 13.2 % — ABNORMAL HIGH (ref 3.0–12.0)
Neutro Abs: 4.7 10*3/uL (ref 1.4–7.7)
Neutrophils Relative %: 62.3 % (ref 43.0–77.0)
Platelets: 360 10*3/uL (ref 150.0–400.0)
RBC: 4.35 Mil/uL (ref 3.87–5.11)
RDW: 14.7 % (ref 11.5–15.5)
WBC: 7.5 10*3/uL (ref 4.0–10.5)

## 2021-10-27 ENCOUNTER — Ambulatory Visit: Payer: Medicare Other | Admitting: Family Medicine

## 2021-10-28 ENCOUNTER — Encounter: Payer: Self-pay | Admitting: Family Medicine

## 2021-10-28 ENCOUNTER — Other Ambulatory Visit: Payer: Medicare Other

## 2021-10-28 MED ORDER — CLONAZEPAM 1 MG PO TABS: ORAL_TABLET | ORAL | 0 refills | Status: DC

## 2021-10-28 MED ORDER — TRAMADOL HCL 50 MG PO TABS: ORAL_TABLET | ORAL | 0 refills | Status: DC

## 2021-10-28 NOTE — Addendum Note (Signed)
Addended by: Leone Haven on: 10/28/2021 10:43 AM   Modules accepted: Orders

## 2021-10-28 NOTE — Telephone Encounter (Signed)
Pt need a refill on these medications. Pt is about out of medication

## 2021-10-28 NOTE — Telephone Encounter (Signed)
Sent to pharmacy 

## 2021-11-04 ENCOUNTER — Encounter: Payer: Self-pay | Admitting: Family Medicine

## 2021-11-04 MED ORDER — PANTOPRAZOLE SODIUM 40 MG PO TBEC: 40.0000 mg | DELAYED_RELEASE_TABLET | Freq: Two times a day (BID) | ORAL | 0 refills | Status: DC

## 2021-11-06 ENCOUNTER — Encounter: Payer: Self-pay | Admitting: Family Medicine

## 2021-11-06 ENCOUNTER — Other Ambulatory Visit: Payer: Self-pay | Admitting: Family Medicine

## 2021-11-06 ENCOUNTER — Ambulatory Visit: Payer: Medicare Other | Admitting: Podiatry

## 2021-11-10 DIAGNOSIS — Z20822 Contact with and (suspected) exposure to covid-19: Secondary | ICD-10-CM | POA: Diagnosis not present

## 2021-11-11 ENCOUNTER — Emergency Department: Payer: Medicare Other

## 2021-11-11 ENCOUNTER — Other Ambulatory Visit: Payer: Self-pay

## 2021-11-11 ENCOUNTER — Inpatient Hospital Stay
Admission: EM | Admit: 2021-11-11 | Discharge: 2021-11-17 | DRG: 392 | Disposition: A | Discharge status: Home / Self Care | Payer: Medicare Other | Attending: Internal Medicine | Admitting: Internal Medicine

## 2021-11-11 DIAGNOSIS — I129 Hypertensive chronic kidney disease with stage 1 through stage 4 chronic kidney disease, or unspecified chronic kidney disease: Secondary | ICD-10-CM | POA: Diagnosis present

## 2021-11-11 DIAGNOSIS — Z87891 Personal history of nicotine dependence: Secondary | ICD-10-CM

## 2021-11-11 DIAGNOSIS — F419 Anxiety disorder, unspecified: Secondary | ICD-10-CM

## 2021-11-11 DIAGNOSIS — K5730 Diverticulosis of large intestine without perforation or abscess without bleeding: Secondary | ICD-10-CM | POA: Diagnosis present

## 2021-11-11 DIAGNOSIS — N182 Chronic kidney disease, stage 2 (mild): Secondary | ICD-10-CM | POA: Diagnosis present

## 2021-11-11 DIAGNOSIS — Z8673 Personal history of transient ischemic attack (TIA), and cerebral infarction without residual deficits: Secondary | ICD-10-CM

## 2021-11-11 DIAGNOSIS — F32A Depression, unspecified: Secondary | ICD-10-CM | POA: Diagnosis present

## 2021-11-11 DIAGNOSIS — Z79899 Other long term (current) drug therapy: Secondary | ICD-10-CM | POA: Diagnosis not present

## 2021-11-11 DIAGNOSIS — Z7902 Long term (current) use of antithrombotics/antiplatelets: Secondary | ICD-10-CM | POA: Diagnosis not present

## 2021-11-11 DIAGNOSIS — K219 Gastro-esophageal reflux disease without esophagitis: Secondary | ICD-10-CM | POA: Diagnosis not present

## 2021-11-11 DIAGNOSIS — N200 Calculus of kidney: Secondary | ICD-10-CM | POA: Diagnosis not present

## 2021-11-11 DIAGNOSIS — Z8249 Family history of ischemic heart disease and other diseases of the circulatory system: Secondary | ICD-10-CM | POA: Diagnosis not present

## 2021-11-11 DIAGNOSIS — K529 Noninfective gastroenteritis and colitis, unspecified: Principal | ICD-10-CM

## 2021-11-11 DIAGNOSIS — E669 Obesity, unspecified: Secondary | ICD-10-CM | POA: Diagnosis present

## 2021-11-11 DIAGNOSIS — I872 Venous insufficiency (chronic) (peripheral): Secondary | ICD-10-CM | POA: Diagnosis present

## 2021-11-11 DIAGNOSIS — Z6832 Body mass index (BMI) 32.0-32.9, adult: Secondary | ICD-10-CM

## 2021-11-11 DIAGNOSIS — Z20822 Contact with and (suspected) exposure to covid-19: Secondary | ICD-10-CM | POA: Diagnosis present

## 2021-11-11 DIAGNOSIS — E876 Hypokalemia: Secondary | ICD-10-CM | POA: Diagnosis present

## 2021-11-11 DIAGNOSIS — I2510 Atherosclerotic heart disease of native coronary artery without angina pectoris: Secondary | ICD-10-CM | POA: Diagnosis not present

## 2021-11-11 DIAGNOSIS — I10 Essential (primary) hypertension: Secondary | ICD-10-CM | POA: Diagnosis not present

## 2021-11-11 DIAGNOSIS — E785 Hyperlipidemia, unspecified: Secondary | ICD-10-CM | POA: Diagnosis not present

## 2021-11-11 DIAGNOSIS — N83201 Unspecified ovarian cyst, right side: Secondary | ICD-10-CM | POA: Diagnosis present

## 2021-11-11 DIAGNOSIS — Z66 Do not resuscitate: Secondary | ICD-10-CM | POA: Diagnosis present

## 2021-11-11 DIAGNOSIS — R1111 Vomiting without nausea: Secondary | ICD-10-CM | POA: Diagnosis not present

## 2021-11-11 DIAGNOSIS — R531 Weakness: Secondary | ICD-10-CM | POA: Diagnosis present

## 2021-11-11 DIAGNOSIS — I6381 Other cerebral infarction due to occlusion or stenosis of small artery: Secondary | ICD-10-CM | POA: Diagnosis present

## 2021-11-11 DIAGNOSIS — Z833 Family history of diabetes mellitus: Secondary | ICD-10-CM

## 2021-11-11 DIAGNOSIS — K449 Diaphragmatic hernia without obstruction or gangrene: Secondary | ICD-10-CM | POA: Diagnosis present

## 2021-11-11 DIAGNOSIS — R110 Nausea: Secondary | ICD-10-CM | POA: Diagnosis not present

## 2021-11-11 DIAGNOSIS — R112 Nausea with vomiting, unspecified: Secondary | ICD-10-CM | POA: Diagnosis not present

## 2021-11-11 DIAGNOSIS — R0902 Hypoxemia: Secondary | ICD-10-CM | POA: Diagnosis not present

## 2021-11-11 LAB — URINALYSIS, ROUTINE W REFLEX MICROSCOPIC
Bilirubin Urine: NEGATIVE
Glucose, UA: NEGATIVE mg/dL
Hgb urine dipstick: NEGATIVE
Ketones, ur: NEGATIVE mg/dL
Leukocytes,Ua: NEGATIVE
Nitrite: NEGATIVE
Protein, ur: NEGATIVE mg/dL
Specific Gravity, Urine: 1.006 (ref 1.005–1.030)
pH: 7 (ref 5.0–8.0)

## 2021-11-11 LAB — CBC
HCT: 41 % (ref 36.0–46.0)
Hemoglobin: 13.2 g/dL (ref 12.0–15.0)
MCH: 27.4 pg (ref 26.0–34.0)
MCHC: 32.2 g/dL (ref 30.0–36.0)
MCV: 85.2 fL (ref 80.0–100.0)
Platelets: 350 10*3/uL (ref 150–400)
RBC: 4.81 MIL/uL (ref 3.87–5.11)
RDW: 14.9 % (ref 11.5–15.5)
WBC: 18.4 10*3/uL — ABNORMAL HIGH (ref 4.0–10.5)
nRBC: 0 % (ref 0.0–0.2)

## 2021-11-11 LAB — COMPREHENSIVE METABOLIC PANEL
ALT: 17 U/L (ref 0–44)
AST: 21 U/L (ref 15–41)
Albumin: 4 g/dL (ref 3.5–5.0)
Alkaline Phosphatase: 110 U/L (ref 38–126)
Anion gap: 10 (ref 5–15)
BUN: 22 mg/dL (ref 8–23)
CO2: 26 mmol/L (ref 22–32)
Calcium: 9.5 mg/dL (ref 8.9–10.3)
Chloride: 101 mmol/L (ref 98–111)
Creatinine, Ser: 1.1 mg/dL — ABNORMAL HIGH (ref 0.44–1.00)
GFR, Estimated: 52 mL/min — ABNORMAL LOW (ref >60)
Glucose, Bld: 120 mg/dL — ABNORMAL HIGH (ref 70–99)
Potassium: 3.5 mmol/L (ref 3.5–5.1)
Sodium: 137 mmol/L (ref 135–145)
Total Bilirubin: 0.6 mg/dL (ref 0.3–1.2)
Total Protein: 7.9 g/dL (ref 6.5–8.1)

## 2021-11-11 LAB — RESP PANEL BY RT-PCR (FLU A&B, COVID) ARPGX2
Influenza A by PCR: NEGATIVE
Influenza B by PCR: NEGATIVE
SARS Coronavirus 2 by RT PCR: NEGATIVE

## 2021-11-11 LAB — LIPASE, BLOOD: Lipase: 57 U/L — ABNORMAL HIGH (ref 11–51)

## 2021-11-11 MED ORDER — CLOPIDOGREL BISULFATE 75 MG PO TABS
75.0000 mg | ORAL_TABLET | Freq: Every day | ORAL | Status: DC
  Administered 2021-11-12 – 2021-11-17 (×6): 75 mg via ORAL
  Filled 2021-11-11 (×6): qty 1

## 2021-11-11 MED ORDER — CLONAZEPAM 0.5 MG PO TABS
0.5000 mg | ORAL_TABLET | Freq: Three times a day (TID) | ORAL | Status: DC | PRN
  Administered 2021-11-16: 0.5 mg via ORAL
  Filled 2021-11-11: qty 1

## 2021-11-11 MED ORDER — ONDANSETRON HCL 4 MG/2ML IJ SOLN
4.0000 mg | Freq: Once | INTRAMUSCULAR | Status: AC
  Administered 2021-11-11: 4 mg via INTRAVENOUS
  Filled 2021-11-11: qty 2

## 2021-11-11 MED ORDER — IBUPROFEN 400 MG PO TABS
200.0000 mg | ORAL_TABLET | Freq: Four times a day (QID) | ORAL | Status: DC | PRN
Start: 2021-11-11 — End: 2021-11-17

## 2021-11-11 MED ORDER — MORPHINE SULFATE (PF) 2 MG/ML IV SOLN: 0.5000 mg | INTRAVENOUS | Status: DC | PRN

## 2021-11-11 MED ORDER — MORPHINE SULFATE (PF) 4 MG/ML IV SOLN
4.0000 mg | Freq: Once | INTRAVENOUS | Status: AC
  Administered 2021-11-11: 4 mg via INTRAVENOUS
  Filled 2021-11-11: qty 1

## 2021-11-11 MED ORDER — PIPERACILLIN-TAZOBACTAM 3.375 G IVPB
3.3750 g | Freq: Three times a day (TID) | INTRAVENOUS | Status: DC
  Administered 2021-11-11 – 2021-11-16 (×14): 3.375 g via INTRAVENOUS
  Filled 2021-11-11 (×14): qty 50

## 2021-11-11 MED ORDER — VITAMIN B-12 1000 MCG PO TABS
1000.0000 ug | ORAL_TABLET | Freq: Every evening | ORAL | Status: DC
  Administered 2021-11-11 – 2021-11-16 (×6): 1000 ug via ORAL
  Filled 2021-11-11 (×7): qty 1

## 2021-11-11 MED ORDER — PIPERACILLIN-TAZOBACTAM 3.375 G IVPB
3.3750 g | Freq: Three times a day (TID) | INTRAVENOUS | Status: DC
Start: 2021-11-11 — End: 2021-11-11

## 2021-11-11 MED ORDER — PIPERACILLIN-TAZOBACTAM 3.375 G IVPB 30 MIN
3.3750 g | Freq: Once | INTRAVENOUS | Status: AC
  Administered 2021-11-11: 3.375 g via INTRAVENOUS
  Filled 2021-11-11: qty 50

## 2021-11-11 MED ORDER — OXYCODONE HCL 5 MG PO TABS
5.0000 mg | ORAL_TABLET | Freq: Four times a day (QID) | ORAL | Status: DC | PRN
Start: 2021-11-11 — End: 2021-11-16
  Administered 2021-11-11 – 2021-11-16 (×6): 5 mg via ORAL
  Filled 2021-11-11 (×6): qty 1

## 2021-11-11 MED ORDER — AMLODIPINE BESYLATE 5 MG PO TABS
5.0000 mg | ORAL_TABLET | Freq: Every day | ORAL | Status: DC
  Administered 2021-11-11 – 2021-11-16 (×6): 5 mg via ORAL
  Filled 2021-11-11 (×5): qty 1

## 2021-11-11 MED ORDER — PANTOPRAZOLE SODIUM 40 MG PO TBEC
40.0000 mg | DELAYED_RELEASE_TABLET | Freq: Two times a day (BID) | ORAL | Status: DC
Start: 2021-11-11 — End: 2021-11-17
  Administered 2021-11-11 – 2021-11-17 (×12): 40 mg via ORAL
  Filled 2021-11-11 (×12): qty 1

## 2021-11-11 MED ORDER — IOHEXOL 300 MG/ML  SOLN
100.0000 mL | Freq: Once | INTRAMUSCULAR | Status: AC | PRN
  Administered 2021-11-11: 100 mL via INTRAVENOUS

## 2021-11-11 MED ORDER — HYDROMORPHONE HCL 1 MG/ML IJ SOLN
1.0000 mg | INTRAMUSCULAR | Status: DC | PRN
  Administered 2021-11-14 – 2021-11-16 (×13): 1 mg via INTRAVENOUS
  Filled 2021-11-11 (×15): qty 1

## 2021-11-11 MED ORDER — ENOXAPARIN SODIUM 40 MG/0.4ML IJ SOSY
40.0000 mg | PREFILLED_SYRINGE | INTRAMUSCULAR | Status: DC
  Filled 2021-11-11 (×4): qty 0.4

## 2021-11-11 MED ORDER — ONDANSETRON HCL 4 MG/2ML IJ SOLN: 4.0000 mg | Freq: Three times a day (TID) | INTRAMUSCULAR | Status: DC | PRN

## 2021-11-11 MED ORDER — SODIUM CHLORIDE 0.9 % IV SOLN
1000.0000 mL | Freq: Once | INTRAVENOUS | Status: AC
  Administered 2021-11-11: 1000 mL via INTRAVENOUS

## 2021-11-11 MED ORDER — FENTANYL CITRATE PF 50 MCG/ML IJ SOSY
25.0000 ug | PREFILLED_SYRINGE | Freq: Once | INTRAMUSCULAR | Status: AC
  Administered 2021-11-11: 25 ug via INTRAVENOUS
  Filled 2021-11-11: qty 1

## 2021-11-11 MED ORDER — HYDRALAZINE HCL 20 MG/ML IJ SOLN
5.0000 mg | INTRAMUSCULAR | Status: DC | PRN
  Filled 2021-11-11: qty 1

## 2021-11-11 MED ORDER — SODIUM CHLORIDE 0.9 % IV SOLN: INTRAVENOUS | Status: DC

## 2021-11-11 MED ORDER — LISINOPRIL 20 MG PO TABS
40.0000 mg | ORAL_TABLET | Freq: Every day | ORAL | Status: DC
Start: 2021-11-12 — End: 2021-11-17
  Administered 2021-11-12 – 2021-11-17 (×6): 40 mg via ORAL
  Filled 2021-11-11 (×7): qty 2

## 2021-11-11 NOTE — ED Triage Notes (Addendum)
Pt comes into the ED via EMS from home with c/o lower abd pain with N/V since 0830am, hx of GI bleeding, a/ox4  154/80 99%RA HR62 CBG119 Temp 97.7 SR #[id number] 4mg  zofran given IV DNR with chart

## 2021-11-11 NOTE — ED Provider Notes (Signed)
[facility] Provider Note    Event Date/Time   First MD Initiated Contact with Patient 11/11/21 1322     (approximate)   History   Abdominal Pain   HPI  Toni Berger is a 76 y.o. female with a history of anxiety, appendectomy, cholecystectomy who presents with abdominal pain, nausea and vomiting.  Patient reports approximately 830 this morning she developed lower abdominal pain followed shortly thereafter by nausea and vomiting.  She has been feeling queasy since then.  Abdominal pain has improved somewhat.  No fevers or chills.  No sick contacts reported     Physical Exam   Triage Vital Signs: ED Triage Vitals  Enc Vitals Group     BP 11/11/21 1227 (!) 142/59     Pulse Rate 11/11/21 1227 (!) 54     Resp 11/11/21 1227 18     Temp 11/11/21 1227 98.7 F (37.1 C)     Temp Source 11/11/21 1327 Oral     SpO2 11/11/21 1227 95 %     Weight --      Height --      Head Circumference --      Peak Flow --      Pain Score --      Pain Loc --      Pain Edu? --      Excl. in [place]? --     Most recent vital signs: Vitals:   11/11/21 1227 11/11/21 1327  BP: (!) 142/59 (!) 145/74  Pulse: (!) 54 (!) 55  Resp: 18 17  Temp: 98.7 F (37.1 C) 98.5 F (36.9 C)  SpO2: 95% 98%     General: Awake, no distress.  CV:  Good peripheral perfusion.  Resp:  Normal effort.  Abd:  No distention.  Mild tenderness right lower quadrant left lower quadrant, no CVA tenderness Other:     ED Results / Procedures / Treatments   Labs (all labs ordered are listed, but only abnormal results are displayed) Labs Reviewed  LIPASE, BLOOD - Abnormal; Notable for the following components:      Result Value   Lipase 57 (*)    All other components within normal limits  COMPREHENSIVE METABOLIC PANEL - Abnormal; Notable for the following components:   Glucose, Bld 120 (*)    Creatinine, Ser 1.10 (*)    GFR, Estimated 52 (*)    All other components within normal limits  CBC  - Abnormal; Notable for the following components:   WBC 18.4 (*)    All other components within normal limits  URINALYSIS, ROUTINE W REFLEX MICROSCOPIC - Abnormal; Notable for the following components:   Color, Urine YELLOW (*)    APPearance HAZY (*)    All other components within normal limits  RESP PANEL BY RT-PCR (FLU A&B, COVID) [id number]     EKG     RADIOLOGY CT abdomen pelvis reviewed by me, radiology notes colitis    PROCEDURES:  Critical Care performed:   Procedures   MEDICATIONS ORDERED IN ED: Medications  piperacillin-tazobactam (ZOSYN) IVPB 3.375 g (has no administration in time range)  morphine (PF) 4 MG/ML injection 4 mg (has no administration in time range)  ondansetron (ZOFRAN) injection 4 mg (4 mg Intravenous Given 11/11/21 1345)  0.9 %  sodium chloride infusion (1,000 mLs Intravenous New Bag/Given 11/11/21 1331)  iohexol (OMNIPAQUE) 300 MG/ML solution 100 mL (100 mLs Intravenous Contrast Given 11/11/21 1358)     IMPRESSION / MDM / ASSESSMENT AND  PLAN / ED COURSE  I reviewed the triage vital signs and the nursing notes.   Patient presents with lower abdominal pain, nausea and vomiting as described above.  She is tender in the right lower quadrant and left lower quadrant, suspicious for colitis versus diverticulitis.  Viral gastroenteritis is also a possibility.  We will treat with IV morphine, IV Zofran and obtain CT imaging given elevated white blood cell count  CMP is overall reassuring  CT scan demonstrates colitis along the descending colon.  The patient has not had any loose stools or diarrhea  We will start IV antibiotics and admit to the hospitalist service         FINAL CLINICAL IMPRESSION(S) / ED DIAGNOSES   Final diagnoses:  Colitis     Rx / DC Orders   ED Discharge Orders     None        Note:  This document was prepared using Dragon voice recognition software and may include unintentional dictation errors.   Lavonia Drafts, MD 11/11/21 [phone]

## 2021-11-11 NOTE — ED Notes (Signed)
Called lab to draw blood cultures

## 2021-11-11 NOTE — H&P (Signed)
History and Physical    Toni Berger QJJ:[id number] DOB: March 13, 1946 DOA: 11/11/2021  Referring MD/NP/PA:   PCP: Leone Haven, MD   Patient coming from:  The patient is coming from home.  At baseline, pt is independent for most of ADL.        Chief Complaint: abdominal pain  HPI: Toni Berger is a 76 y.o. female with medical history significant of hypertension, hyperlipidemia, stroke, GERD, depression with anxiety, upper GI bleeding, migraine, syncope, CKD-2, who presents with abdominal pain.  Patient states that she developed nausea vomiting abdominal pain since 8:30 this morning.  She has had multiple episodes of nonbilious nonbloody vomiting.  Her abdominal pain is mainly located in lower abdomen, constant, sharp, severe, nonradiating.  Patient does not have fever or chills.  Patient does not have diarrhea.  No hematochezia.  Patient states that she had mild shortness of breath earlier, which has resolved.  Currently no cough, shortness breath, chest pain.  No symptoms of UTI.  Patient has chronic venous stasis dermatitis.  She has bilateral mild lower leg edema.  Her right leg is mildly erythematous, which is normal to her per patient.  Data Reviewed and ED Course: pt was found to have WBC 18.4, lipase 51, negative urinalysis, renal function close to baseline, temperature normal, blood pressure 145/74, heart rate 55, RR 18, oxygen saturation 98% on room air.  CT abdomen/pelvis that showed mild transverse and distal colitis.  Patient is admitted to [place] bed as inpatient.  CT of abdomen/pelvis: 1. Evidence of colitis from the mid transverse colon to the distal descending colon. 2. Colonic diverticulosis. 3. Right adnexal/ovarian cyst measuring up to 3.4 cm in size. Recommend nonemergent follow-up ultrasound. 4. Left renal calculus. 5. Moderate hiatal hernia.    EKG: Not done in ED, will get one.     Review of Systems:   General: no fevers, chills, no body weight gain, has  poor appetite, has fatigue HEENT: no blurry vision, hearing changes or sore throat Respiratory: no dyspnea, coughing, wheezing CV: no chest pain, no palpitations GI:  has nausea, vomiting, abdominal pain, no diarrhea, constipation GU: no dysuria, burning on urination, increased urinary frequency, hematuria  Ext: has leg edema Neuro: no unilateral weakness, numbness, or tingling, no vision change or hearing loss Skin: no rash, no skin tear. MSK: No muscle spasm, no deformity, no limitation of range of movement in spin Heme: No easy bruising.  Travel history: No recent long distant travel.   Allergy:  Allergies  Allergen Reactions   Carbidopa-Levodopa Other (See Comments)    REACTION: headache, stomach pain, chest pain and seeing purple colors   Prednisone     Throat swelling    Sulfonamide Derivatives     "stop breathing, eyes roll in back of head and fingers web"   Aquasonic 100 [Ultrasound Gel] Itching   Ciprofloxacin    Pantoprazole    Povidone-Iodine    Propoxyphene Hcl    Sertraline Hcl     headache   Tetracycline    Acetaminophen Diarrhea    Stomach pain   Metoprolol Other (See Comments)    To low heart rate/ BP   Pravastatin Other (See Comments)    Muscle cramps    Past Medical History:  Diagnosis Date   Allergy    Anxiety    Arthritis    Closed rib fracture 01/09/2016   Fibrocystic breast    Hyperlipidemia    Hypertension    Lipoma    5/01  Abdominal wall lipoma   Loss of consciousness ([place]) 12/15/2015   Migraines    Osteoporosis, unspecified    Restless leg syndrome    Stroke (cerebrum) (HCC)    Upper GI bleed 03/27/2019   Venous stasis dermatitis 01/19/2020    Past Surgical History:  Procedure Laterality Date   ABDOMINAL HYSTERECTOMY     APPENDECTOMY  1999   BREAST CYST ASPIRATION     CHOLECYSTECTOMY  1999   DOBUTAMINE STRESS ECHO  06/96   negative   ESOPHAGOGASTRODUODENOSCOPY N/A 03/30/2019   Procedure: ESOPHAGOGASTRODUODENOSCOPY (EGD);  Surgeon:  Lin Landsman, MD;  Location: [facility] ENDOSCOPY;  Service: Gastroenterology;  Laterality: N/A;   GIVENS CAPSULE STUDY N/A 03/31/2019   Procedure: GIVENS CAPSULE STUDY;  Surgeon: Lin Landsman, MD;  Location: [facility] ENDOSCOPY;  Service: Gastroenterology;  Laterality: N/A;   TONSILLECTOMY AND ADENOIDECTOMY  1974   TOTAL ABDOMINAL HYSTERECTOMY W/ BILATERAL SALPINGOOPHORECTOMY  1981    Social History:  reports that she has quit smoking. She has never used smokeless tobacco. She reports that she does not drink alcohol and does not use drugs.  Family History:  Family History  Problem Relation Age of Onset   Heart disease Mother    Diabetes Mother    Aneurysm Mother        AAA   Heart failure Mother    Heart failure Father    Coronary artery disease Father    Colon cancer Neg Hx    Breast cancer Neg Hx      Prior to Admission medications   Medication Sig Start Date End Date Taking? Authorizing Provider  amLODipine (NORVASC) 5 MG tablet Take 1 tablet (5 mg total) by mouth daily. 06/26/21   Leone Haven, MD  clonazePAM (KLONOPIN) 1 MG tablet TAKE 1 TABLET BY MOUTH EVERY 8 TO 12 HOURS AS NEEDED. MAXIMUM 3 DOSES IN 24 HOURS 10/28/21   Leone Haven, MD  clopidogrel (PLAVIX) 75 MG tablet Take 1 tablet (75 mg total) by mouth daily. 06/26/21   Leone Haven, MD  cyanocobalamin 1000 MCG tablet Take 1,000 mcg by mouth daily.    [provider]  lisinopril (ZESTRIL) 40 MG tablet Take 1 tablet (40 mg total) by mouth daily. 06/26/21   Leone Haven, MD  pantoprazole (PROTONIX) 40 MG tablet Take 1 tablet (40 mg total) by mouth 2 (two) times daily before a meal. 11/04/21   Leone Haven, MD  traMADol (ULTRAM) 50 MG tablet TAKE 1 TABLET BY MOUTH EVERY 8 HOURS AS NEEDED FOR SEVERE PAIN 10/28/21   Leone Haven, MD    Physical Exam: Vitals:   11/11/21 1227 11/11/21 1327  BP: (!) 142/59 (!) 145/74  Pulse: (!) 54 (!) 55  Resp: 18 17  Temp: 98.7 F (37.1 C)  98.5 F (36.9 C)  TempSrc:  Oral  SpO2: 95% 98%   General: Not in acute distress HEENT:       Eyes: PERRL, EOMI, no scleral icterus.       ENT: No discharge from the ears and nose, no pharynx injection, no tonsillar enlargement.        Neck: No JVD, no bruit, no mass felt. Heme: No neck lymph node enlargement. Cardiac: S1/S2, RRR, No murmurs, No gallops or rubs. Respiratory: No rales, wheezing, rhonchi or rubs. GI: Soft, nondistended, nontender, no rebound pain, no organomegaly, BS present. GU: No hematuria Ext: has trace leg edema bilaterally.  Has chronic venous stasis.  Has erythematous change in right  lower leg . 1+DP/PT pulse bilaterally. Musculoskeletal: No joint deformities, No joint redness or warmth, no limitation of ROM in spin. Skin: No rashes.  Neuro: Alert, oriented X3, cranial nerves II-XII grossly intact, moves all extremities normally. Psych: Patient is not psychotic, no suicidal or hemocidal ideation.  Labs on Admission: I have personally reviewed following labs and imaging studies  CBC: Recent Labs  Lab 11/11/21 1227  WBC 18.4*  HGB 13.2  HCT 41.0  MCV 85.2  PLT 397   Basic Metabolic Panel: Recent Labs  Lab 11/11/21 1227  NA 137  K 3.5  CL 101  CO2 26  GLUCOSE 120*  BUN 22  CREATININE 1.10*  CALCIUM 9.5   GFR: CrCl cannot be calculated (Unknown ideal weight.). Liver Function Tests: Recent Labs  Lab 11/11/21 1227  AST 21  ALT 17  ALKPHOS 110  BILITOT 0.6  PROT 7.9  ALBUMIN 4.0   Recent Labs  Lab 11/11/21 1227  LIPASE 57*   No results for input(s): AMMONIA in the last 168 hours. Coagulation Profile: No results for input(s): INR, PROTIME in the last 168 hours. Cardiac Enzymes: No results for input(s): CKTOTAL, CKMB, CKMBINDEX, TROPONINI in the last 168 hours. BNP (last 3 results) No results for input(s): PROBNP in the last 8760 hours. HbA1C: No results for input(s): HGBA1C in the last 72 hours. CBG: No results for input(s):  GLUCAP in the last 168 hours. Lipid Profile: No results for input(s): CHOL, HDL, LDLCALC, TRIG, CHOLHDL, LDLDIRECT in the last 72 hours. Thyroid Function Tests: No results for input(s): TSH, T4TOTAL, FREET4, T3FREE, THYROIDAB in the last 72 hours. Anemia Panel: No results for input(s): VITAMINB12, FOLATE, FERRITIN, TIBC, IRON, RETICCTPCT in the last 72 hours. Urine analysis:    Component Value Date/Time   COLORURINE YELLOW (A) 11/11/2021 1230   APPEARANCEUR HAZY (A) 11/11/2021 1230   LABSPEC 1.006 11/11/2021 1230   PHURINE 7.0 11/11/2021 1230   GLUCOSEU NEGATIVE 11/11/2021 1230   HGBUR NEGATIVE 11/11/2021 1230   HGBUR negative 07/22/2010 1221   BILIRUBINUR NEGATIVE 11/11/2021 1230   KETONESUR NEGATIVE 11/11/2021 1230   PROTEINUR NEGATIVE 11/11/2021 1230   UROBILINOGEN 0.2 07/22/2010 1221   NITRITE NEGATIVE 11/11/2021 1230   LEUKOCYTESUR NEGATIVE 11/11/2021 1230   Sepsis Labs: @LABRCNTIP (procalcitonin:4,lacticidven:4) )No results found for this or any previous visit (from the past 240 hour(s)).   Radiological Exams on Admission: CT ABDOMEN PELVIS W CONTRAST  Result Date: 11/11/2021 CLINICAL DATA:  Abdominal pain EXAM: CT ABDOMEN AND PELVIS WITH CONTRAST TECHNIQUE: Multidetector CT imaging of the abdomen and pelvis was performed using the standard protocol following bolus administration of intravenous contrast. RADIATION DOSE REDUCTION: This exam was performed according to the departmental dose-optimization program which includes automated exposure control, adjustment of the mA and/or kV according to patient size and/or use of iterative reconstruction technique. CONTRAST:  165mL OMNIPAQUE IOHEXOL 300 MG/ML  SOLN COMPARISON:  None. FINDINGS: Lower chest: No acute abnormality. Hepatobiliary: Liver is normal in size and contour with no suspicious mass identified. Gallbladder is surgically absent. Mild biliary ductal prominence, likely compensatory from cholecystectomy. Pancreas:  Unremarkable. No pancreatic ductal dilatation or surrounding inflammatory changes. Spleen: Normal in size without focal abnormality. Adrenals/Urinary Tract: 12 mm left adrenal gland nodule which is indeterminate. Right adrenal gland is normal. A few small renal cortical cysts identified. 3 mm nonobstructing calculus in the upper pole left kidney. No hydronephrosis identified bilaterally. Urinary bladder is normal. Stomach/Bowel: Moderate-sized hiatal hernia. No bowel obstruction, free air or pneumatosis. Colonic diverticulosis. Long  segment of colonic wall thickening and minimal pericolonic fat stranding visualized from the mid transverse colon to the distal descending colon. Moderate to large amount of retained fecal material throughout the colon. No evidence of acute appendicitis. Vascular/Lymphatic: No significant vascular findings are present. No enlarged abdominal or pelvic lymph nodes. Reproductive: Hysterectomy changes. 3.4 x 2.1 x 3.4 cm right adnexal cystic structure. Other: No ascites. Musculoskeletal: No suspicious bony lesions identified. IMPRESSION: 1. Evidence of colitis from the mid transverse colon to the distal descending colon. 2. Colonic diverticulosis. 3. Right adnexal/ovarian cyst measuring up to 3.4 cm in size. Recommend nonemergent follow-up ultrasound. 4. Left renal calculus. 5. Moderate hiatal hernia. Electronically Signed   By: Ofilia Neas M.D.   On: 11/11/2021 14:26      Assessment/Plan Principal Problem:   Acute colitis Active Problems:   Remote lacunar infarction (HCC)   Anxiety and depression   GERD (gastroesophageal reflux disease)   CKD (chronic kidney disease), stage II   HTN (hypertension)   Acute colitis: Patient has nausea vomiting, severe abdominal pain, no diarrhea.  No fever or chills.  Patient has leukocytosis with WBC 18.4.  Does not meet criteria for sepsis.  -Admitted to [place] bed as inpatient -IV Zosyn -Blood culture -As needed Dilaudid,  oxycodone for pain -IVF: 1L of NS, then 100 cc/h  - clear diet tonight  Remote lacunar infarction ([place]) -Plavix  Anxiety and depression -continue  home prn Klonopin  CKD (chronic kidney disease), stage II: Renal function close to baseline.  Creatinine 1.10, BUN 22 -Follow-up with BMP  HTN (hypertension):  -IV hydralazine as needed -Continue home amlodipine, lisinopril  GERD: -Protonix       DVT ppx:  SQ Lovenox  Code Status: DNR (pt has DNR form with her)  Family Communication:   Yes, patient's 2 friends   at bed side.  Disposition Plan:  Anticipate discharge back to previous environment  Consults called: none    Admission status and Level of care: Med-Surg:     as inpt         Severity of Illness:  The appropriate patient status for this patient is INPATIENT. Inpatient status is judged to be reasonable and necessary in order to provide the required intensity of service to ensure the patient's safety. The patient's presenting symptoms, physical exam findings, and initial radiographic and laboratory data in the context of their chronic comorbidities is felt to place them at high risk for further clinical deterioration. Furthermore, it is not anticipated that the patient will be medically stable for discharge from the hospital within 2 midnights of admission.   * I certify that at the point of admission it is my clinical judgment that the patient will require inpatient hospital care spanning beyond 2 midnights from the point of admission due to high intensity of service, high risk for further deterioration and high frequency of surveillance required.*       Date of Service 11/11/2021    Ivor Costa [facility]   If 7PM-7AM, please contact night-coverage [email or web address] 11/11/2021, 4:13 PM

## 2021-11-12 ENCOUNTER — Encounter: Payer: Self-pay | Admitting: Internal Medicine

## 2021-11-12 ENCOUNTER — Telehealth: Payer: Self-pay | Admitting: Family Medicine

## 2021-11-12 LAB — BASIC METABOLIC PANEL
Anion gap: 12 (ref 5–15)
BUN: 23 mg/dL (ref 8–23)
CO2: 25 mmol/L (ref 22–32)
Calcium: 9.2 mg/dL (ref 8.9–10.3)
Chloride: 104 mmol/L (ref 98–111)
Creatinine, Ser: 1.2 mg/dL — ABNORMAL HIGH (ref 0.44–1.00)
GFR, Estimated: 47 mL/min — ABNORMAL LOW (ref >60)
Glucose, Bld: 141 mg/dL — ABNORMAL HIGH (ref 70–99)
Potassium: 4.4 mmol/L (ref 3.5–5.1)
Sodium: 141 mmol/L (ref 135–145)

## 2021-11-12 LAB — GLUCOSE, CAPILLARY: Glucose-Capillary: 124 mg/dL — ABNORMAL HIGH (ref 70–99)

## 2021-11-12 LAB — CBC
HCT: 43.9 % (ref 36.0–46.0)
Hemoglobin: 14.5 g/dL (ref 12.0–15.0)
MCH: 27.3 pg (ref 26.0–34.0)
MCHC: 33 g/dL (ref 30.0–36.0)
MCV: 82.5 fL (ref 80.0–100.0)
Platelets: 375 10*3/uL (ref 150–400)
RBC: 5.32 MIL/uL — ABNORMAL HIGH (ref 3.87–5.11)
RDW: 15 % (ref 11.5–15.5)
WBC: 25.1 10*3/uL — ABNORMAL HIGH (ref 4.0–10.5)
nRBC: 0 % (ref 0.0–0.2)

## 2021-11-12 NOTE — Progress Notes (Addendum)
Progress Note    Toni Berger  FXT:[id number] DOB: December 17, 1945  DOA: 11/11/2021 PCP: Leone Haven, MD      Brief Narrative:    Medical records reviewed and are as summarized below:  Toni Berger is a 76 y.o. female with medical history significant of hypertension, hyperlipidemia, stroke, GERD, depression with anxiety, upper GI bleeding, migraine, syncope, CKD-2, who presented to the hospital with nausea, vomiting and abdominal pain.  She was found to have acute colitis.  She was treated with empiric IV antibiotics, analgesics and IV fluids.    Assessment/Plan:   Principal Problem:   Acute colitis Active Problems:   Remote lacunar infarction (HCC)   Anxiety and depression   GERD (gastroesophageal reflux disease)   CKD (chronic kidney disease), stage II   HTN (hypertension)    Body mass index is 32.11 kg/m.  (Obesity)  Acute colitis, worsening leukocytosis: Continue empiric IV antibiotics and analgesics.  Discontinue IV fluids.  Advance diet from clear to full liquid diet.  Monitor WBC.  Hypertension: Continue antihypertensives  Generalized weakness: Consult PT  History of stroke: Continue Plavix  Other comorbidities include CAD, CKD stage II, anxiety and depression.  Diet Order             Diet full liquid Room service appropriate? Yes; Fluid consistency: Thin  Diet effective now                     Consultants: None  Procedures: None    Medications:    amLODipine  5 mg Oral Q1500   clopidogrel  75 mg Oral Daily   enoxaparin (LOVENOX) injection  40 mg Subcutaneous Q24H   lisinopril  40 mg Oral Daily   pantoprazole  40 mg Oral BID AC   cyanocobalamin  1,000 mcg Oral QPM   Continuous Infusions:  sodium chloride 100 mL/hr at 11/12/21 1349   piperacillin-tazobactam (ZOSYN)  IV 3.375 g (11/12/21 1504)     Anti-infectives (From admission, onward)    Start     Dose/Rate Route Frequency Ordered Stop   11/11/21 2200   piperacillin-tazobactam (ZOSYN) IVPB 3.375 g        3.375 g 12.5 mL/hr over 240 Minutes Intravenous Every 8 hours 11/11/21 1954     11/11/21 1715  piperacillin-tazobactam (ZOSYN) IVPB 3.375 g  Status:  Discontinued        3.375 g 12.5 mL/hr over 240 Minutes Intravenous Every 8 hours 11/11/21 1700 11/11/21 1953   11/11/21 1445  piperacillin-tazobactam (ZOSYN) IVPB 3.375 g        3.375 g 100 mL/hr over 30 Minutes Intravenous  Once 11/11/21 1442 11/11/21 1544              Family Communication/Anticipated D/C date and plan/Code Status   DVT prophylaxis: enoxaparin (LOVENOX) injection 40 mg Start: 11/11/21 2200     Code Status: DNR  Family Communication: None Disposition Plan: Plan to discharge home in 1 to 2 days   Status is: Inpatient Remains inpatient appropriate because: IV antibiotics               Subjective:   Interval events noted.  She complains of generalized weakness.  She is a little nauseous but no vomiting.  She still has abdominal pain.  No diarrhea.  Objective:    Vitals:   11/11/21 2200 11/12/21 0400 11/12/21 0755 11/12/21 1502  BP:  (!) 161/84 (!) 146/81 (!) 180/76  Pulse:  (!) 105 (!) 103  100  Resp:  18 17 18   Temp:  99.9 F (37.7 C) 99.7 F (37.6 C) 99 F (37.2 C)  TempSrc:  Oral Oral Oral  SpO2: 95%  95% 97%  Weight:      Height:       No data found.   Intake/Output Summary (Last 24 hours) at 11/12/2021 1504 Last data filed at 11/12/2021 1444 Gross per 24 hour  Intake 2222.69 ml  Output 1200 ml  Net 1022.69 ml   Filed Weights   11/11/21 1950  Weight: 93 kg    Exam:  GEN: NAD SKIN: Chronic erythema of bilateral legs EYES: EOMI ENT: MMM CV: RRR PULM: CTA B ABD: soft, ND, left lower quadrant tenderness, no rebound tenderness or guarding, +BS CNS: AAO x 3, non focal EXT: No edema or tenderness        Data Reviewed:   I have personally reviewed following labs and imaging studies:  Labs: Labs show the  following:   Basic Metabolic Panel: Recent Labs  Lab 11/11/21 1227 11/12/21 0359  NA 137 141  K 3.5 4.4  CL 101 104  CO2 26 25  GLUCOSE 120* 141*  BUN 22 23  CREATININE 1.10* 1.20*  CALCIUM 9.5 9.2   GFR Estimated Creatinine Clearance: 47.4 mL/min (A) (by C-G formula based on SCr of 1.2 mg/dL (H)). Liver Function Tests: Recent Labs  Lab 11/11/21 1227  AST 21  ALT 17  ALKPHOS 110  BILITOT 0.6  PROT 7.9  ALBUMIN 4.0   Recent Labs  Lab 11/11/21 1227  LIPASE 57*   No results for input(s): AMMONIA in the last 168 hours. Coagulation profile No results for input(s): INR, PROTIME in the last 168 hours.  CBC: Recent Labs  Lab 11/11/21 1227 11/12/21 0359  WBC 18.4* 25.1*  HGB 13.2 14.5  HCT 41.0 43.9  MCV 85.2 82.5  PLT 350 375   Cardiac Enzymes: No results for input(s): CKTOTAL, CKMB, CKMBINDEX, TROPONINI in the last 168 hours. BNP (last 3 results) No results for input(s): PROBNP in the last 8760 hours. CBG: Recent Labs  Lab 11/12/21 0756  GLUCAP 124*   D-Dimer: No results for input(s): DDIMER in the last 72 hours. Hgb A1c: No results for input(s): HGBA1C in the last 72 hours. Lipid Profile: No results for input(s): CHOL, HDL, LDLCALC, TRIG, CHOLHDL, LDLDIRECT in the last 72 hours. Thyroid function studies: No results for input(s): TSH, T4TOTAL, T3FREE, THYROIDAB in the last 72 hours.  Invalid input(s): FREET3 Anemia work up: No results for input(s): VITAMINB12, FOLATE, FERRITIN, TIBC, IRON, RETICCTPCT in the last 72 hours. Sepsis Labs: Recent Labs  Lab 11/11/21 1227 11/12/21 0359  WBC 18.4* 25.1*    Microbiology Recent Results (from the past 240 hour(s))  Resp Panel by RT-PCR (Flu A&B, Covid) Nasopharyngeal Swab     Status: None   Collection Time: 11/11/21  2:43 PM   Specimen: Nasopharyngeal Swab; Nasopharyngeal(NP) swabs in vial transport medium  Result Value Ref Range Status   SARS Coronavirus 2 by RT PCR NEGATIVE NEGATIVE Final     Comment: (NOTE) SARS-CoV-2 target nucleic acids are NOT DETECTED.  The SARS-CoV-2 RNA is generally detectable in upper respiratory specimens during the acute phase of infection. The lowest concentration of SARS-CoV-2 viral copies this assay can detect is 138 copies/mL. A negative result does not preclude SARS-Cov-2 infection and should not be used as the sole basis for treatment or other patient management decisions. A negative result may occur with  improper specimen  collection/handling, submission of specimen other than nasopharyngeal swab, presence of viral mutation(s) within the areas targeted by this assay, and inadequate number of viral copies(<138 copies/mL). A negative result must be combined with clinical observations, patient history, and epidemiological information. The expected result is Negative.  Fact Sheet for Patients:  EntrepreneurPulse.com.au  Fact Sheet for Healthcare Providers:  IncredibleEmployment.be  This test is no t yet approved or cleared by the [place] FDA and  has been authorized for detection and/or diagnosis of SARS-CoV-2 by FDA under an Emergency Use Authorization (EUA). This EUA will remain  in effect (meaning this test can be used) for the duration of the COVID-19 declaration under Section 564(b)(1) of the Act, 21 U.S.C.section 360bbb-3(b)(1), unless the authorization is terminated  or revoked sooner.       Influenza A by PCR NEGATIVE NEGATIVE Final   Influenza B by PCR NEGATIVE NEGATIVE Final    Comment: (NOTE) The Xpert Xpress SARS-CoV-2/FLU/RSV plus assay is intended as an aid in the diagnosis of influenza from Nasopharyngeal swab specimens and should not be used as a sole basis for treatment. Nasal washings and aspirates are unacceptable for Xpert Xpress SARS-CoV-2/FLU/RSV testing.  Fact Sheet for Patients: EntrepreneurPulse.com.au  Fact Sheet for Healthcare  Providers: IncredibleEmployment.be  This test is not yet approved or cleared by the [place] FDA and has been authorized for detection and/or diagnosis of SARS-CoV-2 by FDA under an Emergency Use Authorization (EUA). This EUA will remain in effect (meaning this test can be used) for the duration of the COVID-19 declaration under Section 564(b)(1) of the Act, 21 U.S.C. section 360bbb-3(b)(1), unless the authorization is terminated or revoked.  Performed at [facility], [address] [place], [place] [address]   CULTURE, BLOOD (ROUTINE X 2) w Reflex to ID Panel     Status: None (Preliminary result)   Collection Time: 11/11/21  4:25 PM   Specimen: BLOOD  Result Value Ref Range Status   Specimen Description BLOOD RAC  Final   Special Requests BOTTLES DRAWN AEROBIC AND ANAEROBIC BCAV  Final   Culture   Final    NO GROWTH < 12 HOURS Performed at [facility], [address] [place], [place] [address]    Report Status PENDING  Incomplete  CULTURE, BLOOD (ROUTINE X 2) w Reflex to ID Panel     Status: None (Preliminary result)   Collection Time: 11/11/21  4:30 PM   Specimen: BLOOD  Result Value Ref Range Status   Specimen Description BLOOD RFR  Final   Special Requests BOTTLES DRAWN AEROBIC AND ANAEROBIC BCAV  Final   Culture   Final    NO GROWTH < 12 HOURS Performed at [facility], [address], [place], [place] [address]    Report Status PENDING  Incomplete    Procedures and diagnostic studies:  CT ABDOMEN PELVIS W CONTRAST  Result Date: 11/11/2021 CLINICAL DATA:  Abdominal pain EXAM: CT ABDOMEN AND PELVIS WITH CONTRAST TECHNIQUE: Multidetector CT imaging of the abdomen and pelvis was performed using the standard protocol following bolus administration of intravenous contrast. RADIATION DOSE REDUCTION: This exam was performed according to the departmental dose-optimization program which includes automated exposure control,  adjustment of the mA and/or kV according to patient size and/or use of iterative reconstruction technique. CONTRAST:  156mL OMNIPAQUE IOHEXOL 300 MG/ML  SOLN COMPARISON:  None. FINDINGS: Lower chest: No acute abnormality. Hepatobiliary: Liver is normal in size and contour with no suspicious mass identified. Gallbladder is surgically absent. Mild  biliary ductal prominence, likely compensatory from cholecystectomy. Pancreas: Unremarkable. No pancreatic ductal dilatation or surrounding inflammatory changes. Spleen: Normal in size without focal abnormality. Adrenals/Urinary Tract: 12 mm left adrenal gland nodule which is indeterminate. Right adrenal gland is normal. A few small renal cortical cysts identified. 3 mm nonobstructing calculus in the upper pole left kidney. No hydronephrosis identified bilaterally. Urinary bladder is normal. Stomach/Bowel: Moderate-sized hiatal hernia. No bowel obstruction, free air or pneumatosis. Colonic diverticulosis. Long segment of colonic wall thickening and minimal pericolonic fat stranding visualized from the mid transverse colon to the distal descending colon. Moderate to large amount of retained fecal material throughout the colon. No evidence of acute appendicitis. Vascular/Lymphatic: No significant vascular findings are present. No enlarged abdominal or pelvic lymph nodes. Reproductive: Hysterectomy changes. 3.4 x 2.1 x 3.4 cm right adnexal cystic structure. Other: No ascites. Musculoskeletal: No suspicious bony lesions identified. IMPRESSION: 1. Evidence of colitis from the mid transverse colon to the distal descending colon. 2. Colonic diverticulosis. 3. Right adnexal/ovarian cyst measuring up to 3.4 cm in size. Recommend nonemergent follow-up ultrasound. 4. Left renal calculus. 5. Moderate hiatal hernia. Electronically Signed   By: Ofilia Neas M.D.   On: 11/11/2021 14:26               LOS: 1 day   Ivette Castronova  [facility]   Pager on  [email or web address]. If 7PM-7AM, please contact night-coverage at [email or web address]     11/12/2021, 3:04 PM

## 2021-11-12 NOTE — Evaluation (Signed)
Physical Therapy Evaluation ?Patient Details ?Name: Toni Berger ?MRN: [id number] ?DOB: Dec 07, 1945 ?Today's Date: 11/12/2021 ? ?History of Present Illness ? Pt is a 76 yo female that presented to the ED for nausea, vomiting, abdominal pain. workup showed colitis, diverticulitis. PMH of hypertension, hyperlipidemia, stroke, GERD, depression with anxiety, upper GI bleeding, migraine, syncope, CKD-2. ?  ?Clinical Impression ? Patient alert, not agreeable to PT at this time. Extensive education and conversation about benefits of early mobility, discharge planning, and hospital stay provided. Pt did agree to at least bed level exercises, able to move all limbs against gravity without assistance. Several LE exercises performed. Per patient at baseline she lives alone, modI-I for ADLs/mobility. The patient would benefit from further skilled PT intervention for ongoing assessment of mobility and to return to PLOF. Per RN report pt has needed assistance for transfers to [facility] this hospital stay, as well as taking medications and has reported feeling weak. Due to this, recommendation at this time is SNF pending further assessment of mobility.    ?   ? ?Recommendations for follow up therapy are one component of a multi-disciplinary discharge planning process, led by the attending physician.  Recommendations may be updated based on patient status, additional functional criteria and insurance authorization. ? ?Follow Up Recommendations Skilled nursing-[facility] term [facility]hab (<3 hours/day) ? ?  ?Assistance Recommended at Discharge Intermittent Supervision/Assistance  ?Patient can return home with the following ? A lot of help with walking and/or transfers;A lot of help with bathing/dressing/bathroom;Help with stairs or ramp for entrance ? ?  ?Equipment Recommendations Other (comment) (TBD)  ?Recommendations for Other Services ?    ?  ?Functional Status Assessment Patient has had a recent decline in their functional status and demonstrates  the ability to make significant improvements in function in a reasonable and predictable amount of time.  ? ?  ?Precautions / Restrictions Precautions ?Precautions: Fall ?Restrictions ?Weight Bearing Restrictions: No  ? ?  ? ?Mobility ? Bed Mobility ?  ?  ?  ?  ?  ?  ?  ?General bed mobility comments: pt adamantly refusing bed mobility ?  ? ?Transfers ?  ?  ?  ?  ?  ?  ?  ?  ?  ?  ?  ? ?Ambulation/Gait ?  ?  ?  ?  ?  ?  ?  ?  ? ?Stairs ?  ?  ?  ?  ?  ? ?Wheelchair Mobility ?  ? ?Modified Rankin (Stroke Patients Only) ?  ? ?  ? ?Balance   ?  ?  ?  ?  ?  ?  ?  ?  ?  ?  ?  ?  ?  ?  ?  ?  ?  ?  ?   ? ? ? ?Pertinent Vitals/Pain Pain Assessment ?Pain Assessment: Faces ?Faces Pain Scale: Hurts little more ?Pain Location: abdomen ?Pain Descriptors / Indicators: Discomfort, Grimacing, Guarding ?Pain Intervention(s): Limited activity within patient's tolerance, Monitored during session  ? ? ?Home Living Family/patient expects to be discharged to:: Private residence ?Living Arrangements: Alone ?Available Help at Discharge: Friend(s);Available PRN/intermittently ?Type of Home: House ?Home Access: Stairs to enter ?Entrance Stairs-Rails: None ?Entrance Stairs-Number of Steps: 2 ?  ?Home Layout: One level ?Home Equipment: Conservation officer, nature (2 wheels) ?   ?  ?Prior Function Prior Level of Function : Independent/Modified Independent ?  ?  ?  ?  ?  ?  ?Mobility Comments: uses SPC for stair navigation, RW as needed in  home, pt stated not often ?ADLs Comments: independent/modI. instacart for groceries ?  ? ? ?Hand Dominance  ? Dominant Hand: Right ? ?  ?Extremity/Trunk Assessment  ? Upper Extremity Assessment ?Upper Extremity Assessment: Overall WFL for tasks assessed ?  ? ?Lower Extremity Assessment ?Lower Extremity Assessment: Overall WFL for tasks assessed ?  ? ?   ?Communication  ? Communication: No difficulties  ?Cognition Arousal/Alertness: Awake/alert ?Behavior During Therapy: [facility] for tasks assessed/performed, Anxious ?Overall  Cognitive Status: Within Functional Limits for tasks assessed ?  ?  ?  ?  ?  ?  ?  ?  ?  ?  ?  ?  ?  ?  ?  ?  ?  ?  ?  ? ?  ?General Comments   ? ?  ?Exercises General Exercises - Lower Extremity ?Ankle Circles/Pumps: AROM, Both, 10 reps ?Heel Slides: AROM, Strengthening, Both, 10 reps ?Hip ABduction/ADduction: AROM, Strengthening, Both, 10 reps  ? ?Assessment/Plan  ?  ?PT Assessment Patient needs continued PT services  ?PT Problem List Decreased strength;Decreased mobility;Decreased activity tolerance;Decreased balance;Decreased knowledge of use of DME ? ?   ?  ?PT Treatment Interventions DME instruction;Therapeutic exercise;Gait training;Balance training;Stair training;Neuromuscular re-education;Functional mobility training;Therapeutic activities   ? ?PT Goals (Current goals can be found in the Care Plan section)  ?Acute Rehab PT Goals ?Patient Stated Goal: to go home ?PT Goal Formulation: With patient ?Time For Goal Achievement: 11/26/21 ?Potential to Achieve Goals: Good ? ?  ?Frequency Min 2X/week ?  ? ? ?Co-evaluation   ?  ?  ?  ?  ? ? ?  ?AM-PAC PT "6 Clicks" Mobility  ?Outcome Measure Help needed turning from your back to your side while in a flat bed without using bedrails?: A Little ?Help needed moving from lying on your back to sitting on the side of a flat bed without using bedrails?: A Little ?Help needed moving to and from a bed to a chair (including a wheelchair)?: A Little ?Help needed standing up from a chair using your arms (e.g., wheelchair or bedside chair)?: A Little ?Help needed to walk in hospital room?: A Lot ?Help needed climbing 3-5 steps with a railing? : A Lot ?6 Click Score: 16 ? ?  ?End of Session   ?Activity Tolerance: Patient limited by pain ?Patient left: in bed;with call bell/phone within reach;with bed alarm set ?Nurse Communication: Mobility status ?PT Visit Diagnosis: Other abnormalities of gait and mobility (R26.89);Muscle weakness (generalized) (M62.81);Difficulty in walking,  not elsewhere classified (R26.2) ?  ? ?Time: 3419-3790 ?PT Time Calculation (min) (ACUTE ONLY): 18 min ? ? ?Charges:   PT Evaluation ?$PT Eval Low Complexity: 1 Low ?PT Treatments ?$Therapeutic Exercise: 8-22 mins ?  ?   ? ? ?Lieutenant Diego PT, DPT ?3:55 PM,11/12/21 ? ? ?

## 2021-11-12 NOTE — TOC Initial Note (Signed)
Transition of Care (TOC) - Initial/Assessment Note  ? ? ?Patient Details  ?Name: Toni Berger ?MRN: [id number] ?Date of Birth: July 30, 1946 ? ?Transition of Care (TOC) CM/SW Contact:    ?Beverly Sessions, RN ?Phone Number: ?11/12/2021, 9:25 AM ? ?Clinical Narrative:                 ? ? ?Transition of Care (TOC) Screening Note ? ? ?Patient Details  ?Name: Toni Berger ?Date of Birth: 05-18-1946 ? ? ?Transition of Care (TOC) CM/SW Contact:    ?Beverly Sessions, RN ?Phone Number: ?11/12/2021, 9:25 AM ? ? ? ?Transition of Care Department [facility]) has reviewed patient and no TOC needs have been identified at this time. We will continue to monitor patient advancement through interdisciplinary progression rounds. If new patient transition needs arise, please place a TOC consult. ? ? ?  ?  ? ? ?Patient Goals and CMS Choice ?  ?  ?  ? ?Expected Discharge Plan and Services ?  ?  ?  ?  ?  ?                ?  ?  ?  ?  ?  ?  ?  ?  ?  ?  ? ?Prior Living Arrangements/Services ?  ?  ?  ?       ?  ?  ?  ?  ? ?Activities of Daily Living ?Home Assistive Devices/Equipment: Cane (specify quad or straight) ?ADL Screening (condition at time of admission) ?Patient's cognitive ability adequate to safely complete daily activities?: Yes ?Is the patient deaf or have difficulty hearing?: No ?Does the patient have difficulty seeing, even when wearing glasses/contacts?: No ?Does the patient have difficulty concentrating, remembering, or making decisions?: No ?Patient able to express need for assistance with ADLs?: Yes ?Does the patient have difficulty dressing or bathing?: Yes ?Independently performs ADLs?: No ?Communication: Independent ?Dressing (OT): Dependent ?Is this a change from baseline?: Pre-admission baseline ?Grooming: Independent ?Feeding: Independent ?Bathing: Independent ?Toileting: Independent ?In/Out Bed: Independent ?Walks in Home: Independent with device (comment) ?Does the patient have difficulty walking or climbing stairs?:  Yes ?Weakness of Legs: Both ?Weakness of Arms/Hands: None ? ?Permission Sought/Granted ?  ?  ?   ?   ?   ?   ? ?Emotional Assessment ?  ?  ?  ?  ?  ?  ? ?Admission diagnosis:  Colitis [K52.9] ?Acute colitis [K52.9] ?Patient Active Problem List  ? Diagnosis Date Noted  ? Acute colitis 11/11/2021  ? CKD (chronic kidney disease), stage II 11/11/2021  ? HTN (hypertension) 11/11/2021  ? Chest pain 10/21/2021  ? Bilateral leg pain 05/12/2021  ? History of anemia 02/06/2021  ? Right shoulder pain 04/22/2020  ? Abnormal mammogram 02/02/2020  ? Prediabetes 01/19/2020  ? GERD (gastroesophageal reflux disease) 01/19/2020  ? B12 deficiency 09/14/2019  ? Neuropathy 06/12/2019  ? Unsteadiness on feet 06/12/2019  ? Trochanteric bursitis of left hip 03/24/2019  ? Right low back pain 10/24/2018  ? Rib pain on left side 08/11/2018  ? Adrenal adenoma, left 04/23/2018  ? Hiatal hernia 04/23/2018  ? Aortic atherosclerosis ([place]) 04/23/2018  ? Anxiety and depression 04/29/2016  ? Venous insufficiency of both lower extremities 03/23/2016  ? Hyperlipidemia 01/09/2016  ? Remote lacunar infarction ([place]) 12/15/2015  ? Rotator cuff impingement syndrome 12/15/2015  ? Cerebrovascular accident, old 10/29/2015  ? HYPERTENSION, BENIGN 11/12/2010  ? RESTLESS LEG SYNDROME 02/09/2008  ? ALLERGIC RHINITIS 02/09/2008  ? ?PCP:  Caryl Bis,  Angela Adam, MD ?Pharmacy:   ?[id number] [address] ?[place] ?[place] [address] ?Phone: [phone] Fax: [phone] ? ? ? ? ?Social Determinants of Health (SDOH) Interventions ?  ? ?Readmission Risk Interventions ?No flowsheet data found. ? ? ?

## 2021-11-12 NOTE — Telephone Encounter (Signed)
The patient called to inform the provider that she was admitted to [facility] and she is totally aggravated . She is asking the provider to also review her chart. ?

## 2021-11-12 NOTE — Plan of Care (Signed)

## 2021-11-12 NOTE — Telephone Encounter (Signed)
Noted.  We can see here for follow-up when she is discharged. ?

## 2021-11-13 ENCOUNTER — Ambulatory Visit: Payer: Medicare Other | Admitting: Podiatry

## 2021-11-13 LAB — BASIC METABOLIC PANEL
Anion gap: 10 (ref 5–15)
BUN: 20 mg/dL (ref 8–23)
CO2: 24 mmol/L (ref 22–32)
Calcium: 8.7 mg/dL — ABNORMAL LOW (ref 8.9–10.3)
Chloride: 105 mmol/L (ref 98–111)
Creatinine, Ser: 0.99 mg/dL (ref 0.44–1.00)
GFR, Estimated: 59 mL/min — ABNORMAL LOW (ref >60)
Glucose, Bld: 122 mg/dL — ABNORMAL HIGH (ref 70–99)
Potassium: 3.6 mmol/L (ref 3.5–5.1)
Sodium: 139 mmol/L (ref 135–145)

## 2021-11-13 LAB — GLUCOSE, CAPILLARY: Glucose-Capillary: 110 mg/dL — ABNORMAL HIGH (ref 70–99)

## 2021-11-13 LAB — CBC WITH DIFFERENTIAL/PLATELET
Abs Immature Granulocytes: 0.34 10*3/uL — ABNORMAL HIGH (ref 0.00–0.07)
Basophils Absolute: 0.1 10*3/uL (ref 0.0–0.1)
Basophils Relative: 0 %
Eosinophils Absolute: 0 10*3/uL (ref 0.0–0.5)
Eosinophils Relative: 0 %
HCT: 39.7 % (ref 36.0–46.0)
Hemoglobin: 13.1 g/dL (ref 12.0–15.0)
Immature Granulocytes: 1 %
Lymphocytes Relative: 6 %
Lymphs Abs: 1.8 10*3/uL (ref 0.7–4.0)
MCH: 27.2 pg (ref 26.0–34.0)
MCHC: 33 g/dL (ref 30.0–36.0)
MCV: 82.5 fL (ref 80.0–100.0)
Monocytes Absolute: 2.9 10*3/uL — ABNORMAL HIGH (ref 0.1–1.0)
Monocytes Relative: 9 %
Neutro Abs: 26.3 10*3/uL — ABNORMAL HIGH (ref 1.7–7.7)
Neutrophils Relative %: 84 %
Platelets: 328 10*3/uL (ref 150–400)
RBC: 4.81 MIL/uL (ref 3.87–5.11)
RDW: 15 % (ref 11.5–15.5)
WBC: 31.4 10*3/uL — ABNORMAL HIGH (ref 4.0–10.5)
nRBC: 0 % (ref 0.0–0.2)

## 2021-11-13 NOTE — Evaluation (Signed)
Occupational Therapy Evaluation ?Patient Details ?Name: Toni Berger ?MRN: [id number] ?DOB: [birth year] ?Today's Date: 11/13/2021 ? ? ?History of Present Illness Pt is a 76 yo female that presented to the ED for nausea, vomiting, abdominal pain. workup showed colitis, diverticulitis. PMH of hypertension, hyperlipidemia, stroke, GERD, depression with anxiety, upper GI bleeding, migraine, syncope, CKD-2.  ? ?Clinical Impression ?  ?Pt seen for OT evaluation this date in setting of acute hospitalization d/t colitis. She presents with abdominal pain limiting ADLs. She is INDEP and lives alone at baseline. Today, she requires: SETUP for seated and standing UB ADLs, MIN A to don shoes d/t abdominal pain limiting bending, SUPV for ADL transfers with no AD (refuses walker, resorts to furniture walking, no gross LOB). Pt back to bed end of session with all needs met and in reach. Anticipate she will be safe to go home with [place] f/u.  ?   ? ?Recommendations for follow up therapy are one component of a multi-disciplinary discharge planning process, led by the attending physician.  Recommendations may be updated based on patient status, additional functional criteria and insurance authorization.  ? ?Follow Up Recommendations ? Home health OT  ?  ?Assistance Recommended at Discharge Set up Supervision/Assistance  ?Patient can return home with the following Assistance with cooking/housework;Assist for transportation;Help with stairs or ramp for entrance ? ?  ?Functional Status Assessment ? Patient has had a recent decline in their functional status and demonstrates the ability to make significant improvements in function in a reasonable and predictable amount of time.  ?Equipment Recommendations ? Tub/shower seat  ?  ?Recommendations for Other Services   ? ? ?  ?Precautions / Restrictions Precautions ?Precautions: Fall ?Restrictions ?Weight Bearing Restrictions: No  ? ?  ? ?Mobility Bed Mobility ?  ?Bed Mobility: Supine to Sit, Sit  to Supine ?  ?  ?Supine to sit: Min guard ?Sit to supine: Min guard ?  ?  ?  ? ?Transfers ?Overall transfer level: Needs assistance ?Equipment used: None ?Transfers: Sit to/from Stand ?Sit to Stand: Supervision, Min guard ?  ?  ?  ?  ?  ?General transfer comment: CGA fading to SUPV ?  ? ?  ?Balance Overall balance assessment: Needs assistance ?  ?Sitting balance-Leahy Scale: Good ?  ?  ?Standing balance support: Single extremity supported ?Standing balance-Leahy Scale: Fair ?Standing balance comment: refused walker, cmopleted fxl mobility to sink with furtniture walking ?  ?  ?  ?  ?  ?  ?  ?  ?  ?  ?  ?   ? ?ADL either performed or assessed with clinical judgement  ? ?ADL   ?  ?  ?  ?  ?  ?  ?  ?  ?  ?  ?  ?  ?  ?  ?  ?  ?  ?  ?  ?General ADL Comments: SETUP for seated and standing UB ADLs, MIN A to don shoes d/t abdominal pain limiting bending, SUPV for ADL transfers with no AD (refuses walker, resorts to furniture walking, no gross LOB)  ? ? ? ?Vision Patient Visual Report: No change from baseline ?   ?   ?Perception   ?  ?Praxis   ?  ? ?Pertinent Vitals/Pain Pain Assessment ?Pain Assessment: Faces ?Faces Pain Scale: Hurts even more ?Pain Location: abdomen ?Pain Descriptors / Indicators: Discomfort, Grimacing, Guarding ?Pain Intervention(s): Limited activity within patient's tolerance, Monitored during session, Repositioned  ? ? ? ?Hand Dominance Right ?  ?  Extremity/Trunk Assessment Upper Extremity Assessment ?Upper Extremity Assessment: Overall WFL for tasks assessed ?  ?Lower Extremity Assessment ?Lower Extremity Assessment: Overall WFL for tasks assessed ?  ?  ?  ?Communication Communication ?Communication: No difficulties ?  ?Cognition Arousal/Alertness: Awake/alert ?Behavior During Therapy: [facility] for tasks assessed/performed, Anxious ?Overall Cognitive Status: Within Functional Limits for tasks assessed ?  ?  ?  ?  ?  ?  ?  ?  ?  ?  ?  ?  ?  ?  ?  ?  ?General Comments: particular, sometimes not ammenable to  safety cues. A&o ?  ?  ?General Comments    ? ?  ?Exercises Other Exercises ?Other Exercises: OT ed re: role ?  ?Shoulder Instructions    ? ? ?Home Living Family/patient expects to be discharged to:: Private residence ?Living Arrangements: Alone ?Available Help at Discharge: Friend(s);Available PRN/intermittently ?Type of Home: House ?Home Access: Stairs to enter ?Entrance Stairs-Number of Steps: 2 ?Entrance Stairs-Rails: None ?Home Layout: One level ?  ?  ?  ?  ?  ?  ?  ?Home Equipment: Conservation officer, nature (2 wheels) ?  ?  ?  ? ?  ?Prior Functioning/Environment Prior Level of Function : Independent/Modified Independent ?  ?  ?  ?  ?  ?  ?Mobility Comments: uses SPC for stair navigation, RW as needed in home, pt stated not often ?ADLs Comments: independent/modI. instacart for groceries ?  ? ?  ?  ?OT Problem List: Decreased strength;Decreased activity tolerance ?  ?   ?OT Treatment/Interventions: Self-care/ADL training;Therapeutic exercise;Therapeutic activities;Patient/family education  ?  ?OT Goals(Current goals can be found in the care plan section) Acute Rehab OT Goals ?Patient Stated Goal: go home ?OT Goal Formulation: With patient ?Time For Goal Achievement: 11/27/21 ?Potential to Achieve Goals: Good ?ADL Goals ?Pt Will Perform Lower Body Dressing: with supervision ?Pt Will Transfer to Toilet: with supervision  ?OT Frequency: Min 2X/week ?  ? ?Co-evaluation   ?  ?  ?  ?  ? ?  ?AM-PAC OT "6 Clicks" Daily Activity     ?Outcome Measure Help from another person eating meals?: None ?Help from another person taking care of personal grooming?: None ?Help from another person toileting, which includes using toliet, bedpan, or urinal?: A Little ?Help from another person bathing (including washing, rinsing, drying)?: A Little ?Help from another person to put on and taking off regular upper body clothing?: None ?Help from another person to put on and taking off regular lower body clothing?: A Little ?6 Click Score: 21 ?  ?End  of Session Nurse Communication: Mobility status ? ?Activity Tolerance: Patient tolerated treatment well ?Patient left: in bed;with call bell/phone within reach;with bed alarm set ? ?OT Visit Diagnosis: Unsteadiness on feet (R26.81)  ?              ?Time: 0272-5366 ?OT Time Calculation (min): 27 min ?Charges:  OT General Charges ?$OT Visit: 1 Visit ?OT Evaluation ?$OT Eval Low Complexity: 1 Low ?OT Treatments ?$Self Care/Home Management : 8-22 mins ? ?Gerrianne Scale, Concepcion, OTR/L ?ascom [phone] ?11/13/21, 5:33 PM  ?

## 2021-11-13 NOTE — Progress Notes (Signed)
PT Cancellation Note ? ?Patient Details ?Name: Toni Berger ?MRN: [id number] ?DOB: [birth year] ? ? ?Cancelled Treatment:    Reason Eval/Treat Not Completed: Fatigue/lethargy limiting ability to participate. Patient reports someone from therapy was "just here". ( No note from therapy in epic). Se declines any mobility at this time. States she got up to bsc and brushed her teeth and is just tired now. Will continue to follow.    ? ? ?[name] ?11/13/2021, 2:39 PM ?

## 2021-11-13 NOTE — Progress Notes (Addendum)
Progress Note    Toni Berger  TOI:[id number] DOB: 1946-08-19  DOA: 11/11/2021 PCP: Leone Haven, MD      Brief Narrative:    Medical records reviewed and are as summarized below:  Toni Berger is a 76 y.o. female with medical history significant of hypertension, hyperlipidemia, stroke, GERD, depression with anxiety, upper GI bleeding, migraine, syncope, CKD-2, who presented to the hospital with nausea, vomiting and abdominal pain.  She was found to have acute colitis.  She was treated with empiric IV antibiotics, analgesics and IV fluids.    Assessment/Plan:   Principal Problem:   Acute colitis Active Problems:   Remote lacunar infarction (HCC)   Anxiety and depression   GERD (gastroesophageal reflux disease)   CKD (chronic kidney disease), stage II   HTN (hypertension)    Body mass index is 32.11 kg/m.  (Obesity)  Acute colitis, worsening leukocytosis: WBC is trending upward.  Continue empiric IV Zosyn and analgesics.  Continue full liquid diet.  Hypertension: Continue antihypertensives  Generalized weakness: PT recommends discharge to SNF.  Continue PT.  History of stroke: Continue Plavix  Other comorbidities include CAD, CKD stage II, anxiety and depression.  Diet Order             Diet full liquid Room service appropriate? Yes; Fluid consistency: Thin  Diet effective now                     Consultants: None  Procedures: None    Medications:    amLODipine  5 mg Oral Q1500   clopidogrel  75 mg Oral Daily   enoxaparin (LOVENOX) injection  40 mg Subcutaneous Q24H   lisinopril  40 mg Oral Daily   pantoprazole  40 mg Oral BID AC   cyanocobalamin  1,000 mcg Oral QPM   Continuous Infusions:  piperacillin-tazobactam (ZOSYN)  IV 12.5 mL/hr at 11/13/21 8338     Anti-infectives (From admission, onward)    Start     Dose/Rate Route Frequency Ordered Stop   11/11/21 2200  piperacillin-tazobactam (ZOSYN) IVPB 3.375 g         3.375 g 12.5 mL/hr over 240 Minutes Intravenous Every 8 hours 11/11/21 1954     11/11/21 1715  piperacillin-tazobactam (ZOSYN) IVPB 3.375 g  Status:  Discontinued        3.375 g 12.5 mL/hr over 240 Minutes Intravenous Every 8 hours 11/11/21 1700 11/11/21 1953   11/11/21 1445  piperacillin-tazobactam (ZOSYN) IVPB 3.375 g        3.375 g 100 mL/hr over 30 Minutes Intravenous  Once 11/11/21 1442 11/11/21 1544              Family Communication/Anticipated D/C date and plan/Code Status   DVT prophylaxis: enoxaparin (LOVENOX) injection 40 mg Start: 11/11/21 2200     Code Status: DNR  Family Communication: None Disposition Plan: Plan to discharge home in 1 to 2 days   Status is: Inpatient Remains inpatient appropriate because: IV antibiotics               Subjective:   She complains of abdominal pain increasing last night about 10 in severity.  No vomiting or diarrhea.  She says she does not want IV pain medicine because she is worried about becoming drowsy or groggy from IV opioid analgesic.  Objective:    Vitals:   11/12/21 0755 11/12/21 1502 11/12/21 2055 11/13/21 0811  BP: (!) 146/81 (!) 180/76 (!) 155/87 (!) 161/79  Pulse: (!) 103 100 100 93  Resp: 17 18 20 16   Temp: 99.7 F (37.6 C) 99 F (37.2 C) 99.2 F (37.3 C) 98.9 F (37.2 C)  TempSrc: Oral Oral Oral Oral  SpO2: 95% 97% 95% 96%  Weight:      Height:       No data found.   Intake/Output Summary (Last 24 hours) at 11/13/2021 1422 Last data filed at 11/13/2021 0900 Gross per 24 hour  Intake 1025.49 ml  Output 1150 ml  Net -124.51 ml   Filed Weights   11/11/21 1950  Weight: 93 kg    Exam:  GEN: NAD SKIN: No rash EYES: EOMI ENT: MMM CV: RRR PULM: CTA B ABD: soft, ND, left lower quadrant tenderness without rebound tenderness or guarding, +BS CNS: AAO x 3, non focal EXT: No edema or tenderness         Data Reviewed:   I have personally reviewed following labs and imaging  studies:  Labs: Labs show the following:   Basic Metabolic Panel: Recent Labs  Lab 11/11/21 1227 11/12/21 0359 11/13/21 0346  NA 137 141 139  K 3.5 4.4 3.6  CL 101 104 105  CO2 26 25 24   GLUCOSE 120* 141* 122*  BUN 22 23 20   CREATININE 1.10* 1.20* 0.99  CALCIUM 9.5 9.2 8.7*   GFR Estimated Creatinine Clearance: 57.5 mL/min (by C-G formula based on SCr of 0.99 mg/dL). Liver Function Tests: Recent Labs  Lab 11/11/21 1227  AST 21  ALT 17  ALKPHOS 110  BILITOT 0.6  PROT 7.9  ALBUMIN 4.0   Recent Labs  Lab 11/11/21 1227  LIPASE 57*   No results for input(s): AMMONIA in the last 168 hours. Coagulation profile No results for input(s): INR, PROTIME in the last 168 hours.  CBC: Recent Labs  Lab 11/11/21 1227 11/12/21 0359 11/13/21 0346  WBC 18.4* 25.1* 31.4*  NEUTROABS  --   --  26.3*  HGB 13.2 14.5 13.1  HCT 41.0 43.9 39.7  MCV 85.2 82.5 82.5  PLT 350 375 328   Cardiac Enzymes: No results for input(s): CKTOTAL, CKMB, CKMBINDEX, TROPONINI in the last 168 hours. BNP (last 3 results) No results for input(s): PROBNP in the last 8760 hours. CBG: Recent Labs  Lab 11/12/21 0756 11/13/21 0810  GLUCAP 124* 110*   D-Dimer: No results for input(s): DDIMER in the last 72 hours. Hgb A1c: No results for input(s): HGBA1C in the last 72 hours. Lipid Profile: No results for input(s): CHOL, HDL, LDLCALC, TRIG, CHOLHDL, LDLDIRECT in the last 72 hours. Thyroid function studies: No results for input(s): TSH, T4TOTAL, T3FREE, THYROIDAB in the last 72 hours.  Invalid input(s): FREET3 Anemia work up: No results for input(s): VITAMINB12, FOLATE, FERRITIN, TIBC, IRON, RETICCTPCT in the last 72 hours. Sepsis Labs: Recent Labs  Lab 11/11/21 1227 11/12/21 0359 11/13/21 0346  WBC 18.4* 25.1* 31.4*    Microbiology Recent Results (from the past 240 hour(s))  Resp Panel by RT-PCR (Flu A&B, Covid) Nasopharyngeal Swab     Status: None   Collection Time: 11/11/21  2:43  PM   Specimen: Nasopharyngeal Swab; Nasopharyngeal(NP) swabs in vial transport medium  Result Value Ref Range Status   SARS Coronavirus 2 by RT PCR NEGATIVE NEGATIVE Final    Comment: (NOTE) SARS-CoV-2 target nucleic acids are NOT DETECTED.  The SARS-CoV-2 RNA is generally detectable in upper respiratory specimens during the acute phase of infection. The lowest concentration of SARS-CoV-2 viral copies this assay can detect  is 138 copies/mL. A negative result does not preclude SARS-Cov-2 infection and should not be used as the sole basis for treatment or other patient management decisions. A negative result may occur with  improper specimen collection/handling, submission of specimen other than nasopharyngeal swab, presence of viral mutation(s) within the areas targeted by this assay, and inadequate number of viral copies(<138 copies/mL). A negative result must be combined with clinical observations, patient history, and epidemiological information. The expected result is Negative.  Fact Sheet for Patients:  EntrepreneurPulse.com.au  Fact Sheet for Healthcare Providers:  IncredibleEmployment.be  This test is no t yet approved or cleared by the [place] FDA and  has been authorized for detection and/or diagnosis of SARS-CoV-2 by FDA under an Emergency Use Authorization (EUA). This EUA will remain  in effect (meaning this test can be used) for the duration of the COVID-19 declaration under Section 564(b)(1) of the Act, 21 U.S.C.section 360bbb-3(b)(1), unless the authorization is terminated  or revoked sooner.       Influenza A by PCR NEGATIVE NEGATIVE Final   Influenza B by PCR NEGATIVE NEGATIVE Final    Comment: (NOTE) The Xpert Xpress SARS-CoV-2/FLU/RSV plus assay is intended as an aid in the diagnosis of influenza from Nasopharyngeal swab specimens and should not be used as a sole basis for treatment. Nasal washings and aspirates are  unacceptable for Xpert Xpress SARS-CoV-2/FLU/RSV testing.  Fact Sheet for Patients: EntrepreneurPulse.com.au  Fact Sheet for Healthcare Providers: IncredibleEmployment.be  This test is not yet approved or cleared by the [place] FDA and has been authorized for detection and/or diagnosis of SARS-CoV-2 by FDA under an Emergency Use Authorization (EUA). This EUA will remain in effect (meaning this test can be used) for the duration of the COVID-19 declaration under Section 564(b)(1) of the Act, 21 U.S.C. section 360bbb-3(b)(1), unless the authorization is terminated or revoked.  Performed at [facility], [address] [place], [place] [address]   CULTURE, BLOOD (ROUTINE X 2) w Reflex to ID Panel     Status: None (Preliminary result)   Collection Time: 11/11/21  4:25 PM   Specimen: BLOOD  Result Value Ref Range Status   Specimen Description BLOOD RAC  Final   Special Requests BOTTLES DRAWN AEROBIC AND ANAEROBIC BCAV  Final   Culture   Final    NO GROWTH 2 DAYS Performed at [facility], [address], [place], [place] [address]    Report Status PENDING  Incomplete  CULTURE, BLOOD (ROUTINE X 2) w Reflex to ID Panel     Status: None (Preliminary result)   Collection Time: 11/11/21  4:30 PM   Specimen: BLOOD  Result Value Ref Range Status   Specimen Description BLOOD RFR  Final   Special Requests BOTTLES DRAWN AEROBIC AND ANAEROBIC BCAV  Final   Culture   Final    NO GROWTH 2 DAYS Performed at [facility], [address] [place], [place] [address]    Report Status PENDING  Incomplete    Procedures and diagnostic studies:  No results found.             LOS: 2 days   Toni Berger  [facility]   Pager on [email or web address]. If 7PM-7AM, please contact night-coverage at [email or web address]     11/13/2021, 2:22 PM

## 2021-11-13 NOTE — TOC Initial Note (Signed)
Transition of Care (TOC) - Initial/Assessment Note  ? ? ?Patient Details  ?Name: Toni Berger ?MRN: [id number] ?Date of Birth: 08/14/1946 ? ?Transition of Care (TOC) CM/SW Contact:    ?Beverly Sessions, RN ?Phone Number: ?11/13/2021, 1:56 PM ? ?Clinical Narrative:                 ?Admitted GLO:[id number] ?Admitted from: home alone ?RJJ:[id number] ?Pharmacy:[facility] ?Current home health/prior home health/DME: [facility] and cane ? ?Patient states at baseline she drives [place] distances ?Yesterday patient declined mobility and therapy recommends SNF ? ?Met with patient at bedside today.  She declines SNF.  Discussed at length with patient about home health services. Patient also declines home health services ? ? ? ?Expected Discharge Plan: Home/Self Care ?Barriers to Discharge: Continued Medical Work up ? ? ?Patient Goals and CMS Choice ?  ?  ?  ? ?Expected Discharge Plan and Services ?Expected Discharge Plan: Home/Self Care ?  ?Discharge Planning Services: CM Consult ?  ?Living arrangements for the past 2 months: [place] ?                ?  ?  ?  ?  ?  ?HH Arranged: Refused HH ?  ?  ?  ?  ? ?Prior Living Arrangements/Services ?Living arrangements for the past 2 months: [place] ?Lives with:: Self ?Patient language and need for interpreter reviewed:: Yes ?Do you feel safe going back to the place where you live?: Yes      ?Need for Family Participation in Patient Care: No (Comment) ?Care giver support system in place?: No (comment) ?Current home services: DME ?Criminal Activity/Legal Involvement Pertinent to Current Situation/Hospitalization: No - Comment as needed ? ?Activities of Daily Living ?Home Assistive Devices/Equipment: Cane (specify quad or straight) ?ADL Screening (condition at time of admission) ?Patient's cognitive ability adequate to safely complete daily activities?: Yes ?Is the patient deaf or have difficulty hearing?: No ?Does the patient have difficulty seeing, even when wearing  glasses/contacts?: No ?Does the patient have difficulty concentrating, remembering, or making decisions?: No ?Patient able to express need for assistance with ADLs?: Yes ?Does the patient have difficulty dressing or bathing?: Yes ?Independently performs ADLs?: No ?Communication: Independent ?Dressing (OT): Dependent ?Is this a change from baseline?: Pre-admission baseline ?Grooming: Independent ?Feeding: Independent ?Bathing: Independent ?Toileting: Independent ?In/Out Bed: Independent ?Walks in Home: Independent with device (comment) ?Does the patient have difficulty walking or climbing stairs?: Yes ?Weakness of Legs: Both ?Weakness of Arms/Hands: None ? ?Permission Sought/Granted ?  ?  ?   ?   ?   ?   ? ?Emotional Assessment ?Appearance:: Appears stated age ?  ?  ?Orientation: : Oriented to Self, Oriented to Place, Oriented to  Time, Oriented to Situation ?Alcohol / Substance Use: Not Applicable ?Psych Involvement: No (comment) ? ?Admission diagnosis:  Colitis [K52.9] ?Acute colitis [K52.9] ?Patient Active Problem List  ? Diagnosis Date Noted  ? Acute colitis 11/11/2021  ? CKD (chronic kidney disease), stage II 11/11/2021  ? HTN (hypertension) 11/11/2021  ? Chest pain 10/21/2021  ? Bilateral leg pain 05/12/2021  ? History of anemia 02/06/2021  ? Right shoulder pain 04/22/2020  ? Abnormal mammogram 02/02/2020  ? Prediabetes 01/19/2020  ? GERD (gastroesophageal reflux disease) 01/19/2020  ? B12 deficiency 09/14/2019  ? Neuropathy 06/12/2019  ? Unsteadiness on feet 06/12/2019  ? Trochanteric bursitis of left hip 03/24/2019  ? Right low back pain 10/24/2018  ? Rib pain on left side 08/11/2018  ? Adrenal adenoma,  left 04/23/2018  ? Hiatal hernia 04/23/2018  ? Aortic atherosclerosis ([place]) 04/23/2018  ? Anxiety and depression 04/29/2016  ? Venous insufficiency of both lower extremities 03/23/2016  ? Hyperlipidemia 01/09/2016  ? Remote lacunar infarction ([place]) 12/15/2015  ? Rotator cuff impingement syndrome 12/15/2015  ?  Cerebrovascular accident, old 10/29/2015  ? HYPERTENSION, BENIGN 11/12/2010  ? RESTLESS LEG SYNDROME 02/09/2008  ? ALLERGIC RHINITIS 02/09/2008  ? ?PCP:  Leone Haven, MD ?Pharmacy:   ?[id number] [address], [address] ?[place] ?[place] [address] ?Phone: [phone] Fax: [phone] ? ? ? ? ?Social Determinants of Health (SDOH) Interventions ?  ? ?Readmission Risk Interventions ?No flowsheet data found. ? ? ?

## 2021-11-14 LAB — CBC WITH DIFFERENTIAL/PLATELET
Abs Immature Granulocytes: 0.17 10*3/uL — ABNORMAL HIGH (ref 0.00–0.07)
Basophils Absolute: 0.1 10*3/uL (ref 0.0–0.1)
Basophils Relative: 0 %
Eosinophils Absolute: 0.1 10*3/uL (ref 0.0–0.5)
Eosinophils Relative: 1 %
HCT: 35.3 % — ABNORMAL LOW (ref 36.0–46.0)
Hemoglobin: 11.7 g/dL — ABNORMAL LOW (ref 12.0–15.0)
Immature Granulocytes: 1 %
Lymphocytes Relative: 9 %
Lymphs Abs: 2.2 10*3/uL (ref 0.7–4.0)
MCH: 27.9 pg (ref 26.0–34.0)
MCHC: 33.1 g/dL (ref 30.0–36.0)
MCV: 84 fL (ref 80.0–100.0)
Monocytes Absolute: 1.7 10*3/uL — ABNORMAL HIGH (ref 0.1–1.0)
Monocytes Relative: 7 %
Neutro Abs: 20.2 10*3/uL — ABNORMAL HIGH (ref 1.7–7.7)
Neutrophils Relative %: 82 %
Platelets: 267 10*3/uL (ref 150–400)
RBC: 4.2 MIL/uL (ref 3.87–5.11)
RDW: 15.3 % (ref 11.5–15.5)
WBC: 24.5 10*3/uL — ABNORMAL HIGH (ref 4.0–10.5)
nRBC: 0 % (ref 0.0–0.2)

## 2021-11-14 LAB — GLUCOSE, CAPILLARY: Glucose-Capillary: 93 mg/dL (ref 70–99)

## 2021-11-14 NOTE — Progress Notes (Signed)
Physical Therapy Treatment ?Patient Details ?Name: Toni Berger ?MRN: [id number] ?DOB: 1946-06-29 ?Today's Date: 11/14/2021 ? ? ?History of Present Illness Pt is a 76 yo female that presented to the ED for nausea, vomiting, abdominal pain. workup showed colitis, diverticulitis. PMH of hypertension, hyperlipidemia, stroke, GERD, depression with anxiety, upper GI bleeding, migraine, syncope, CKD-2. ? ?  ?PT Comments  ? ? Pt seen for PT tx with pt agreeable to tx but very reluctant to extensive participation. Pt initially agreeable to ambulating to sink & does so with RW with CGA<>supervision but pushes it out of the way & ambulates without AD.  Pt completes grooming tasks at sink, standing for ~15 min with supervision. Pt with difficulty engaging in tasks & conversing with PT at the same time. Pt is very resistant to going to STR upon d/c as pt reports she'll go home & die if that happens. Pt also requiring significant education re: participation in PT now vs after infection has cleared as pt states "I've only been here on medicine for 2 days" despite PT educating her multiple times throughout session. Pt then agreeable to ambulating outside with brief donned with pt ambulating 1 lap with CGA with multiple, frequent standing breaks 2/2 fatigue & pain. PT educated pt on need to use BSC vs purewick & need to sit in recliner vs lying in bed. Also educated her on need to mobilize with mobility tech over the weekend. While pt is mobilizing better, with increased distances today, she reports she has no family/friends that can assist her at d/c & pt is currently unsafe to go home & perform basic tasks (making bed, carrying in groceries, picking objects up off the floor) & would benefit from STR to maximize independence & decrease fall risk. ?  ?Recommendations for follow up therapy are one component of a multi-disciplinary discharge planning process, led by the attending physician.  Recommendations may be updated based on  patient status, additional functional criteria and insurance authorization. ? ?Follow Up Recommendations ? Skilled nursing-[facility] term [facility]hab (<3 hours/day) ?  ?  ?Assistance Recommended at Discharge Intermittent Supervision/Assistance  ?Patient can return home with the following A little help with walking and/or transfers;A little help with bathing/dressing/bathroom;Assistance with cooking/housework;Help with stairs or ramp for entrance;Assist for transportation;Direct supervision/assist for financial management ?  ?Equipment Recommendations ? Rolling walker (2 wheels);BSC/3in1  ?  ?Recommendations for Other Services   ? ? ?  ?Precautions / Restrictions Precautions ?Precautions: Fall ?Restrictions ?Weight Bearing Restrictions: No  ?  ? ?Mobility ? Bed Mobility ?Overal bed mobility: Modified Independent ?Bed Mobility: Supine to Sit, Sit to Supine ?  ?  ?Supine to sit: HOB elevated, Modified independent (Device/Increase time) ?Sit to supine: Modified independent (Device/Increase time), HOB elevated ?  ?  ?  ? ?Transfers ?Overall transfer level: Needs assistance ?Equipment used: None ?Transfers: Sit to/from Stand ?Sit to Stand: Min guard, Supervision ?  ?  ?  ?  ?  ?  ?  ? ?Ambulation/Gait ?Ambulation/Gait assistance: Min guard, Supervision ?Gait Distance (Feet):  (10 ft + 10 ft + 160 ft) ?Assistive device: Rolling walker (2 wheels), None ?Gait Pattern/deviations: Decreased step length - right, Decreased stride length, Decreased step length - left, Shuffle, Trunk flexed ?Gait velocity: decreased ?  ?  ?General Gait Details: guarded gait, multiple standing breaks 2/2 pain/fatigue ? ? ?Stairs ?  ?  ?  ?  ?  ? ? ?Wheelchair Mobility ?  ? ?Modified Rankin (Stroke Patients Only) ?  ? ? ?  ?  Balance Overall balance assessment: Needs assistance ?Sitting-balance support: Feet supported, No upper extremity supported ?Sitting balance-Leahy Scale: Good ?  ?  ?Standing balance support: During functional activity, No upper extremity  supported ?Standing balance-Leahy Scale: Fair ?  ?  ?  ?  ?  ?  ?  ?  ?  ?  ?  ?  ?  ? ?  ?Cognition Arousal/Alertness: Awake/alert ?Behavior During Therapy: [facility] for tasks assessed/performed, Anxious ?Overall Cognitive Status: Within Functional Limits for tasks assessed ?  ?  ?  ?  ?  ?  ?  ?  ?  ?  ?  ?  ?  ?  ?  ?  ?General Comments: Resistant to education/encouragement, seems to have some memory issues as pt has the same conversation back to back with PT, difficulty performing mobility task & conversing as she asks to speak after she's finished grooming tasks/standing at the sink ?  ?  ? ?  ?Exercises   ? ?  ?General Comments   ?  ?  ? ?Pertinent Vitals/Pain Pain Assessment ?Pain Assessment: 0-10 ?Pain Score: 8  ?Pain Location: abdomen & chronic back pain ?Pain Descriptors / Indicators: Discomfort, Grimacing, Guarding ?Pain Intervention(s): Monitored during session, Premedicated before session  ? ? ?Home Living   ?  ?  ?  ?  ?  ?  ?  ?  ?  ?   ?  ?Prior Function    ?  ?  ?   ? ?PT Goals (current goals can now be found in the care plan section) Acute Rehab PT Goals ?Patient Stated Goal: to go home ?PT Goal Formulation: With patient ?Time For Goal Achievement: 11/26/21 ?Potential to Achieve Goals: Good ?Progress towards PT goals: Progressing toward goals ? ?  ?Frequency ? ? ? Min 2X/week ? ? ? ?  ?PT Plan Current plan remains appropriate  ? ? ?Co-evaluation   ?  ?  ?  ?  ? ?  ?AM-PAC PT "6 Clicks" Mobility   ?Outcome Measure ? Help needed turning from your back to your side while in a flat bed without using bedrails?: None ?Help needed moving from lying on your back to sitting on the side of a flat bed without using bedrails?: None ?Help needed moving to and from a bed to a chair (including a wheelchair)?: A Little ?Help needed standing up from a chair using your arms (e.g., wheelchair or bedside chair)?: A Little ?Help needed to walk in hospital room?: A Little ?Help needed climbing 3-5 steps with a railing? : A  Lot ?6 Click Score: 19 ? ?  ?End of Session Equipment Utilized During Treatment: Gait belt ?Activity Tolerance: Patient tolerated treatment well ?Patient left: in bed;with call bell/phone within reach;with bed alarm set ?Nurse Communication: Mobility status ?PT Visit Diagnosis: Other abnormalities of gait and mobility (R26.89);Muscle weakness (generalized) (M62.81);Difficulty in walking, not elsewhere classified (R26.2) ?  ? ? ?Time: 3086-5784 ?PT Time Calculation (min) (ACUTE ONLY): 59 min ? ?Charges:  $Therapeutic Activity: 53-67 mins          ?          ? ?Lavone Nian, PT, DPT ?11/14/21, 1:07 PM ? ? ? ?Waunita Schooner ?11/14/2021, 1:03 PM ? ?

## 2021-11-14 NOTE — TOC Progression Note (Signed)
Transition of Care (TOC) - Progression Note  ? ? ?Patient Details  ?Name: Toni Berger ?MRN: [id number] ?Date of Birth: 05/02/1946 ? ?Transition of Care (TOC) CM/SW Contact  ?Carina Chaplin E Kalmen Lollar, LCSW ?Phone Number: ?11/14/2021, 12:48 PM ? ?Clinical Narrative:   Spoke to patient who continues to refuse SNF and Home Health. Explained OT rec for tub/shower seat. Patient declines, says she had one in the past and doesn't need one now. ? ? ? ?Expected Discharge Plan: Home/Self Care ?Barriers to Discharge: Continued Medical Work up ? ?Expected Discharge Plan and Services ?Expected Discharge Plan: Home/Self Care ?  ?Discharge Planning Services: CM Consult ?  ?Living arrangements for the past 2 months: [place] ?                ?  ?  ?  ?  ?  ?HH Arranged: Refused HH ?  ?  ?  ?  ? ? ?Social Determinants of Health (SDOH) Interventions ?  ? ?Readmission Risk Interventions ?No flowsheet data found. ? ?

## 2021-11-14 NOTE — Care Management Important Message (Signed)
Important Message ? ?Patient Details  ?Name: Toni Berger ?MRN: [id number] ?Date of Birth: Oct 17, 1945 ? ? ?Medicare Important Message Given:  Yes ? ? ? ? ?Dannette Barbara ?11/14/2021, 11:17 AM ?

## 2021-11-14 NOTE — Progress Notes (Signed)
Progress Note    Toni Berger  LGX:[id number] DOB: [birth year]  DOA: 11/11/2021 PCP: Leone Haven, MD      Brief Narrative:    Medical records reviewed and are as summarized below:  [name] is a 76 y.o. female with medical history significant of hypertension, hyperlipidemia, stroke, GERD, depression with anxiety, upper GI bleeding, migraine, syncope, CKD-2, who presented to the hospital with nausea, vomiting and abdominal pain.  She was found to have acute colitis.  She was treated with empiric IV antibiotics, analgesics and IV fluids.    Assessment/Plan:   Principal Problem:   Acute colitis Active Problems:   Remote lacunar infarction (HCC)   Anxiety and depression   GERD (gastroesophageal reflux disease)   CKD (chronic kidney disease), stage II   HTN (hypertension)    Body mass index is 32.11 kg/m.  (Obesity)  Acute colitis, leukocytosis: WBC is trending down.  Continue IV Zosyn and analgesics as needed for pain.  Advance diet from full liquid diet to regular diet to see how she does.    Hypertension: Continue antihypertensives.  Generalized weakness: PT recommends discharge to SNF.  However, patient refuses to go to SNF.  She prefers to go home.  She emphasized that she does not want to go home tomorrow because she is too weak.  Continue PT.  History of stroke: Continue Plavix  Other comorbidities include CAD, CKD stage II, anxiety and depression.  Diet Order             Diet Heart Room service appropriate? Yes; Fluid consistency: Thin  Diet effective now                     Consultants: None  Procedures: None    Medications:    amLODipine  5 mg Oral Q1500   clopidogrel  75 mg Oral Daily   enoxaparin (LOVENOX) injection  40 mg Subcutaneous Q24H   lisinopril  40 mg Oral Daily   pantoprazole  40 mg Oral BID AC   cyanocobalamin  1,000 mcg Oral QPM   Continuous Infusions:  piperacillin-tazobactam (ZOSYN)  IV 3.375 g  (11/14/21 0508)     Anti-infectives (From admission, onward)    Start     Dose/Rate Route Frequency Ordered Stop   11/11/21 2200  piperacillin-tazobactam (ZOSYN) IVPB 3.375 g        3.375 g 12.5 mL/hr over 240 Minutes Intravenous Every 8 hours 11/11/21 1954     11/11/21 1715  piperacillin-tazobactam (ZOSYN) IVPB 3.375 g  Status:  Discontinued        3.375 g 12.5 mL/hr over 240 Minutes Intravenous Every 8 hours 11/11/21 1700 11/11/21 1953   11/11/21 1445  piperacillin-tazobactam (ZOSYN) IVPB 3.375 g        3.375 g 100 mL/hr over 30 Minutes Intravenous  Once 11/11/21 1442 11/11/21 1544              Family Communication/Anticipated D/C date and plan/Code Status   DVT prophylaxis: enoxaparin (LOVENOX) injection 40 mg Start: 11/11/21 2200     Code Status: DNR  Family Communication: None Disposition Plan: Plan to discharge home in 1 to 2 days   Status is: Inpatient Remains inpatient appropriate because: IV antibiotics               Subjective:   Interval events noted.  She complains of generalized weakness.  Lower abdominal pain is a little better.  She decided to try IV Dilaudid  and it helped her a lot.  Objective:    Vitals:   11/13/21 1539 11/13/21 2004 11/14/21 0313 11/14/21 0800  BP: (!) 163/75 134/74 (!) 146/74 124/67  Pulse: 89 77 80 85  Resp: 18 18 18 18   Temp: 98.3 F (36.8 C) 98.6 F (37 C) 98.1 F (36.7 C) 98.9 F (37.2 C)  TempSrc:  Oral    SpO2: 94% 98% 96% 97%  Weight:      Height:       No data found.   Intake/Output Summary (Last 24 hours) at 11/14/2021 1212 Last data filed at 11/14/2021 0307 Gross per 24 hour  Intake 114.49 ml  Output 500 ml  Net -385.51 ml   Filed Weights   11/11/21 1950  Weight: 93 kg    Exam:  GEN: NAD SKIN: No rash EYES: EOMI ENT: MMM CV: RRR PULM: CTA B ABD: soft, obese, mild left lower quadrant tenderness, no rebound tenderness or guarding, +BS CNS: AAO x 3, non focal EXT: No edema or  tenderness           Data Reviewed:   I have personally reviewed following labs and imaging studies:  Labs: Labs show the following:   Basic Metabolic Panel: Recent Labs  Lab 11/11/21 1227 11/12/21 0359 11/13/21 0346  NA 137 141 139  K 3.5 4.4 3.6  CL 101 104 105  CO2 26 25 24   GLUCOSE 120* 141* 122*  BUN 22 23 20   CREATININE 1.10* 1.20* 0.99  CALCIUM 9.5 9.2 8.7*   GFR Estimated Creatinine Clearance: 57.5 mL/min (by C-G formula based on SCr of 0.99 mg/dL). Liver Function Tests: Recent Labs  Lab 11/11/21 1227  AST 21  ALT 17  ALKPHOS 110  BILITOT 0.6  PROT 7.9  ALBUMIN 4.0   Recent Labs  Lab 11/11/21 1227  LIPASE 57*   No results for input(s): AMMONIA in the last 168 hours. Coagulation profile No results for input(s): INR, PROTIME in the last 168 hours.  CBC: Recent Labs  Lab 11/11/21 1227 11/12/21 0359 11/13/21 0346 11/14/21 0405  WBC 18.4* 25.1* 31.4* 24.5*  NEUTROABS  --   --  26.3* 20.2*  HGB 13.2 14.5 13.1 11.7*  HCT 41.0 43.9 39.7 35.3*  MCV 85.2 82.5 82.5 84.0  PLT 350 375 328 267   Cardiac Enzymes: No results for input(s): CKTOTAL, CKMB, CKMBINDEX, TROPONINI in the last 168 hours. BNP (last 3 results) No results for input(s): PROBNP in the last 8760 hours. CBG: Recent Labs  Lab 11/12/21 0756 11/13/21 0810 11/14/21 0759  GLUCAP 124* 110* 93   D-Dimer: No results for input(s): DDIMER in the last 72 hours. Hgb A1c: No results for input(s): HGBA1C in the last 72 hours. Lipid Profile: No results for input(s): CHOL, HDL, LDLCALC, TRIG, CHOLHDL, LDLDIRECT in the last 72 hours. Thyroid function studies: No results for input(s): TSH, T4TOTAL, T3FREE, THYROIDAB in the last 72 hours.  Invalid input(s): FREET3 Anemia work up: No results for input(s): VITAMINB12, FOLATE, FERRITIN, TIBC, IRON, RETICCTPCT in the last 72 hours. Sepsis Labs: Recent Labs  Lab 11/11/21 1227 11/12/21 0359 11/13/21 0346 11/14/21 0405  WBC 18.4*  25.1* 31.4* 24.5*    Microbiology Recent Results (from the past 240 hour(s))  Resp Panel by RT-PCR (Flu A&B, Covid) Nasopharyngeal Swab     Status: None   Collection Time: 11/11/21  2:43 PM   Specimen: Nasopharyngeal Swab; Nasopharyngeal(NP) swabs in vial transport medium  Result Value Ref Range Status   SARS Coronavirus  2 by RT PCR NEGATIVE NEGATIVE Final    Comment: (NOTE) SARS-CoV-2 target nucleic acids are NOT DETECTED.  The SARS-CoV-2 RNA is generally detectable in upper respiratory specimens during the acute phase of infection. The lowest concentration of SARS-CoV-2 viral copies this assay can detect is 138 copies/mL. A negative result does not preclude SARS-Cov-2 infection and should not be used as the sole basis for treatment or other patient management decisions. A negative result may occur with  improper specimen collection/handling, submission of specimen other than nasopharyngeal swab, presence of viral mutation(s) within the areas targeted by this assay, and inadequate number of viral copies(<138 copies/mL). A negative result must be combined with clinical observations, patient history, and epidemiological information. The expected result is Negative.  Fact Sheet for Patients:  EntrepreneurPulse.com.au  Fact Sheet for Healthcare Providers:  IncredibleEmployment.be  This test is no t yet approved or cleared by the [place] FDA and  has been authorized for detection and/or diagnosis of SARS-CoV-2 by FDA under an Emergency Use Authorization (EUA). This EUA will remain  in effect (meaning this test can be used) for the duration of the COVID-19 declaration under Section 564(b)(1) of the Act, 21 U.S.C.section 360bbb-3(b)(1), unless the authorization is terminated  or revoked sooner.       Influenza A by PCR NEGATIVE NEGATIVE Final   Influenza B by PCR NEGATIVE NEGATIVE Final    Comment: (NOTE) The Xpert Xpress  SARS-CoV-2/FLU/RSV plus assay is intended as an aid in the diagnosis of influenza from Nasopharyngeal swab specimens and should not be used as a sole basis for treatment. Nasal washings and aspirates are unacceptable for Xpert Xpress SARS-CoV-2/FLU/RSV testing.  Fact Sheet for Patients: EntrepreneurPulse.com.au  Fact Sheet for Healthcare Providers: IncredibleEmployment.be  This test is not yet approved or cleared by the [place] FDA and has been authorized for detection and/or diagnosis of SARS-CoV-2 by FDA under an Emergency Use Authorization (EUA). This EUA will remain in effect (meaning this test can be used) for the duration of the COVID-19 declaration under Section 564(b)(1) of the Act, 21 U.S.C. section 360bbb-3(b)(1), unless the authorization is terminated or revoked.  Performed at [facility], [address] [place], [place] [address]   CULTURE, BLOOD (ROUTINE X 2) w Reflex to ID Panel     Status: None (Preliminary result)   Collection Time: 11/11/21  4:25 PM   Specimen: BLOOD  Result Value Ref Range Status   Specimen Description BLOOD RAC  Final   Special Requests BOTTLES DRAWN AEROBIC AND ANAEROBIC BCAV  Final   Culture   Final    NO GROWTH 3 DAYS Performed at [facility], [address], [place], [place] [address]    Report Status PENDING  Incomplete  CULTURE, BLOOD (ROUTINE X 2) w Reflex to ID Panel     Status: None (Preliminary result)   Collection Time: 11/11/21  4:30 PM   Specimen: BLOOD  Result Value Ref Range Status   Specimen Description BLOOD RFR  Final   Special Requests BOTTLES DRAWN AEROBIC AND ANAEROBIC BCAV  Final   Culture   Final    NO GROWTH 3 DAYS Performed at [facility], [address] [place], [place] [address]    Report Status PENDING  Incomplete    Procedures and diagnostic studies:  No results found.             LOS: 3 days   Jasminemarie Sherrard  [facility]   Pager on [email or web address]  7PM-7AM, please contact night-coverage at [email or web address]     11/14/2021, 12:12 PM

## 2021-11-15 LAB — CBC WITH DIFFERENTIAL/PLATELET
Abs Immature Granulocytes: 0.06 10*3/uL (ref 0.00–0.07)
Basophils Absolute: 0.1 10*3/uL (ref 0.0–0.1)
Basophils Relative: 0 %
Eosinophils Absolute: 0.2 10*3/uL (ref 0.0–0.5)
Eosinophils Relative: 1 %
HCT: 33.8 % — ABNORMAL LOW (ref 36.0–46.0)
Hemoglobin: 10.8 g/dL — ABNORMAL LOW (ref 12.0–15.0)
Immature Granulocytes: 0 %
Lymphocytes Relative: 7 %
Lymphs Abs: 1.3 10*3/uL (ref 0.7–4.0)
MCH: 26.8 pg (ref 26.0–34.0)
MCHC: 32 g/dL (ref 30.0–36.0)
MCV: 83.9 fL (ref 80.0–100.0)
Monocytes Absolute: 1.6 10*3/uL — ABNORMAL HIGH (ref 0.1–1.0)
Monocytes Relative: 10 %
Neutro Abs: 13.9 10*3/uL — ABNORMAL HIGH (ref 1.7–7.7)
Neutrophils Relative %: 82 %
Platelets: 289 10*3/uL (ref 150–400)
RBC: 4.03 MIL/uL (ref 3.87–5.11)
RDW: 14.9 % (ref 11.5–15.5)
WBC: 17.2 10*3/uL — ABNORMAL HIGH (ref 4.0–10.5)
nRBC: 0 % (ref 0.0–0.2)

## 2021-11-15 NOTE — Progress Notes (Signed)
Progress Note    Toni Berger  PNT:[id number] DOB: 1946/01/28  DOA: 11/11/2021 PCP: Leone Haven, MD      Brief Narrative:    Medical records reviewed and are as summarized below:  Toni Berger is a 76 y.o. female with medical history significant of hypertension, hyperlipidemia, stroke, GERD, depression with anxiety, upper GI bleeding, migraine, syncope, CKD-2, who presented to the hospital with nausea, vomiting and abdominal pain.  She was found to have acute colitis.  She was treated with empiric IV antibiotics, analgesics and IV fluids.    Assessment/Plan:   Principal Problem:   Acute colitis Active Problems:   Remote lacunar infarction (HCC)   Anxiety and depression   GERD (gastroesophageal reflux disease)   CKD (chronic kidney disease), stage II   HTN (hypertension)    Body mass index is 32.11 kg/m.  (Obesity)  Acute colitis, leukocytosis: WBC continues to trend down.  Continue IV Zosyn.  Continue analgesics as needed for pain.  Monitor CBC.  Hypertension: Continue antihypertensives  Generalized weakness: PT recommends discharge to SNF but patient refuses SNF or home health services.  Continue PT and OT while inpatient.  History of stroke: Continue Plavix  Other comorbidities include CAD, CKD stage II, anxiety and depression.  Diet Order             Diet Heart Room service appropriate? Yes; Fluid consistency: Thin  Diet effective now                     Consultants: None  Procedures: None    Medications:    amLODipine  5 mg Oral Q1500   clopidogrel  75 mg Oral Daily   enoxaparin (LOVENOX) injection  40 mg Subcutaneous Q24H   lisinopril  40 mg Oral Daily   pantoprazole  40 mg Oral BID AC   cyanocobalamin  1,000 mcg Oral QPM   Continuous Infusions:  piperacillin-tazobactam (ZOSYN)  IV 3.375 g (11/15/21 0556)     Anti-infectives (From admission, onward)    Start     Dose/Rate Route Frequency Ordered Stop   11/11/21  2200  piperacillin-tazobactam (ZOSYN) IVPB 3.375 g        3.375 g 12.5 mL/hr over 240 Minutes Intravenous Every 8 hours 11/11/21 1954     11/11/21 1715  piperacillin-tazobactam (ZOSYN) IVPB 3.375 g  Status:  Discontinued        3.375 g 12.5 mL/hr over 240 Minutes Intravenous Every 8 hours 11/11/21 1700 11/11/21 1953   11/11/21 1445  piperacillin-tazobactam (ZOSYN) IVPB 3.375 g        3.375 g 100 mL/hr over 30 Minutes Intravenous  Once 11/11/21 1442 11/11/21 1544              Family Communication/Anticipated D/C date and plan/Code Status   DVT prophylaxis: enoxaparin (LOVENOX) injection 40 mg Start: 11/11/21 2200     Code Status: DNR  Family Communication: None Disposition Plan: Plan to discharge home in 1 to 2 days   Status is: Inpatient Remains inpatient appropriate because: IV antibiotics               Subjective:   Interval events noted.  No vomiting, nausea or diarrhea.  She still has some abdominal pain but she is tolerating her diet.  Objective:    Vitals:   11/14/21 1503 11/14/21 2049 11/15/21 0419 11/15/21 0804  BP: 121/69 122/64 (!) 134/58 (!) 129/58  Pulse: 73 79 84 80  Resp: 18  $'16 20 18  'Q$ Temp: 98.9 F (37.2 C) 98.2 F (36.8 C) 98.4 F (36.9 C) 98.4 F (36.9 C)  TempSrc:   Oral Oral  SpO2: 96% 97% 97% 96%  Weight:      Height:       No data found.   Intake/Output Summary (Last 24 hours) at 11/15/2021 1139 Last data filed at 11/15/2021 0900 Gross per 24 hour  Intake 960 ml  Output 300 ml  Net 660 ml   Filed Weights   11/11/21 1950  Weight: 93 kg    Exam:  GEN: NAD SKIN: No rash EYES: EOMI ENT: MMM CV: RRR PULM: CTA B ABD: soft, ND, mid abdominal and left lower quadrant tenderness, no rebound tenderness or guarding, +BS CNS: AAO x 3, non focal EXT: No edema or tenderness          Data Reviewed:   I have personally reviewed following labs and imaging studies:  Labs: Labs show the following:   Basic  Metabolic Panel: Recent Labs  Lab 11/11/21 1227 11/12/21 0359 11/13/21 0346  NA 137 141 139  K 3.5 4.4 3.6  CL 101 104 105  CO2 '26 25 24  '$ GLUCOSE 120* 141* 122*  BUN '22 23 20  '$ CREATININE 1.10* 1.20* 0.99  CALCIUM 9.5 9.2 8.7*   GFR Estimated Creatinine Clearance: 57.5 mL/min (by C-G formula based on SCr of 0.99 mg/dL). Liver Function Tests: Recent Labs  Lab 11/11/21 1227  AST 21  ALT 17  ALKPHOS 110  BILITOT 0.6  PROT 7.9  ALBUMIN 4.0   Recent Labs  Lab 11/11/21 1227  LIPASE 57*   No results for input(s): AMMONIA in the last 168 hours. Coagulation profile No results for input(s): INR, PROTIME in the last 168 hours.  CBC: Recent Labs  Lab 11/11/21 1227 11/12/21 0359 11/13/21 0346 11/14/21 0405 11/15/21 0402  WBC 18.4* 25.1* 31.4* 24.5* 17.2*  NEUTROABS  --   --  26.3* 20.2* 13.9*  HGB 13.2 14.5 13.1 11.7* 10.8*  HCT 41.0 43.9 39.7 35.3* 33.8*  MCV 85.2 82.5 82.5 84.0 83.9  PLT 350 375 328 267 289   Cardiac Enzymes: No results for input(s): CKTOTAL, CKMB, CKMBINDEX, TROPONINI in the last 168 hours. BNP (last 3 results) No results for input(s): PROBNP in the last 8760 hours. CBG: Recent Labs  Lab 11/12/21 0756 11/13/21 0810 11/14/21 0759  GLUCAP 124* 110* 93   D-Dimer: No results for input(s): DDIMER in the last 72 hours. Hgb A1c: No results for input(s): HGBA1C in the last 72 hours. Lipid Profile: No results for input(s): CHOL, HDL, LDLCALC, TRIG, CHOLHDL, LDLDIRECT in the last 72 hours. Thyroid function studies: No results for input(s): TSH, T4TOTAL, T3FREE, THYROIDAB in the last 72 hours.  Invalid input(s): FREET3 Anemia work up: No results for input(s): VITAMINB12, FOLATE, FERRITIN, TIBC, IRON, RETICCTPCT in the last 72 hours. Sepsis Labs: Recent Labs  Lab 11/12/21 0359 11/13/21 0346 11/14/21 0405 11/15/21 0402  WBC 25.1* 31.4* 24.5* 17.2*    Microbiology Recent Results (from the past 240 hour(s))  Resp Panel by RT-PCR (Flu A&B,  Covid) Nasopharyngeal Swab     Status: None   Collection Time: 11/11/21  2:43 PM   Specimen: Nasopharyngeal Swab; Nasopharyngeal(NP) swabs in vial transport medium  Result Value Ref Range Status   SARS Coronavirus 2 by RT PCR NEGATIVE NEGATIVE Final    Comment: (NOTE) SARS-CoV-2 target nucleic acids are NOT DETECTED.  The SARS-CoV-2 RNA is generally detectable in upper respiratory specimens  during the acute phase of infection. The lowest concentration of SARS-CoV-2 viral copies this assay can detect is 138 copies/mL. A negative result does not preclude SARS-Cov-2 infection and should not be used as the sole basis for treatment or other patient management decisions. A negative result may occur with  improper specimen collection/handling, submission of specimen other than nasopharyngeal swab, presence of viral mutation(s) within the areas targeted by this assay, and inadequate number of viral copies(<138 copies/mL). A negative result must be combined with clinical observations, patient history, and epidemiological information. The expected result is Negative.  Fact Sheet for Patients:  EntrepreneurPulse.com.au  Fact Sheet for Healthcare Providers:  IncredibleEmployment.be  This test is no t yet approved or cleared by the [place] FDA and  has been authorized for detection and/or diagnosis of SARS-CoV-2 by FDA under an Emergency Use Authorization (EUA). This EUA will remain  in effect (meaning this test can be used) for the duration of the COVID-19 declaration under Section 564(b)(1) of the Act, 21 U.S.C.section 360bbb-3(b)(1), unless the authorization is terminated  or revoked sooner.       Influenza A by PCR NEGATIVE NEGATIVE Final   Influenza B by PCR NEGATIVE NEGATIVE Final    Comment: (NOTE) The Xpert Xpress SARS-CoV-2/FLU/RSV plus assay is intended as an aid in the diagnosis of influenza from Nasopharyngeal swab specimens and should  not be used as a sole basis for treatment. Nasal washings and aspirates are unacceptable for Xpert Xpress SARS-CoV-2/FLU/RSV testing.  Fact Sheet for Patients: EntrepreneurPulse.com.au  Fact Sheet for Healthcare Providers: IncredibleEmployment.be  This test is not yet approved or cleared by the [place] FDA and has been authorized for detection and/or diagnosis of SARS-CoV-2 by FDA under an Emergency Use Authorization (EUA). This EUA will remain in effect (meaning this test can be used) for the duration of the COVID-19 declaration under Section 564(b)(1) of the Act, 21 U.S.C. section 360bbb-3(b)(1), unless the authorization is terminated or revoked.  Performed at [facility], [address] [place], [place] [address]   CULTURE, BLOOD (ROUTINE X 2) w Reflex to ID Panel     Status: None (Preliminary result)   Collection Time: 11/11/21  4:25 PM   Specimen: BLOOD  Result Value Ref Range Status   Specimen Description BLOOD RAC  Final   Special Requests BOTTLES DRAWN AEROBIC AND ANAEROBIC BCAV  Final   Culture   Final    NO GROWTH 4 DAYS Performed at [facility], [address], [place], [place] [address]    Report Status PENDING  Incomplete  CULTURE, BLOOD (ROUTINE X 2) w Reflex to ID Panel     Status: None (Preliminary result)   Collection Time: 11/11/21  4:30 PM   Specimen: BLOOD  Result Value Ref Range Status   Specimen Description BLOOD RFR  Final   Special Requests BOTTLES DRAWN AEROBIC AND ANAEROBIC BCAV  Final   Culture   Final    NO GROWTH 4 DAYS Performed at [facility], [address], [place], [place] [address]    Report Status PENDING  Incomplete    Procedures and diagnostic studies:  No results found.             LOS: 4 days   Chayson Charters  [facility]   Pager on [email or web address]. If 7PM-7AM, please contact night-coverage at [email or web address]     11/15/2021, 11:39 AM

## 2021-11-15 NOTE — Progress Notes (Signed)
Mobility Specialist - Progress Note ? ? 11/15/21 1300  ?Mobility  ?Activity Transferred to/from [facility]  ?Level of Assistance Standby assist, set-up cues, supervision of patient - no hands on  ?Assistive Device None;BSC  ?Distance Ambulated (ft) 2 ft  ?Activity Response Tolerated well  ?$Mobility charge 1 Mobility  ? ? ? ?Pt initially declined mobility, however needed assistance to transfer from [facility] and ambulate to sink for face washing. Pt refused RW use. Does voice weakness. Stood with supervision and extra time. Able to perform peri-care with assist only for more thorough cleaning. Pt then declines further activity/ambulation to sink for ADLs and requesting to return to bed d/t fatigue. ADLs completed while semi-supine. Pt left in bed with needs in reach.  ? ? ?Kathee Delton ?Mobility Specialist ?11/15/21, 1:42 PM ? ? ? ? ?

## 2021-11-16 LAB — CBC WITH DIFFERENTIAL/PLATELET
Abs Immature Granulocytes: 0.08 10*3/uL — ABNORMAL HIGH (ref 0.00–0.07)
Basophils Absolute: 0.1 10*3/uL (ref 0.0–0.1)
Basophils Relative: 1 %
Eosinophils Absolute: 0.2 10*3/uL (ref 0.0–0.5)
Eosinophils Relative: 2 %
HCT: 31.5 % — ABNORMAL LOW (ref 36.0–46.0)
Hemoglobin: 10.3 g/dL — ABNORMAL LOW (ref 12.0–15.0)
Immature Granulocytes: 1 %
Lymphocytes Relative: 14 %
Lymphs Abs: 1.8 10*3/uL (ref 0.7–4.0)
MCH: 27.3 pg (ref 26.0–34.0)
MCHC: 32.7 g/dL (ref 30.0–36.0)
MCV: 83.6 fL (ref 80.0–100.0)
Monocytes Absolute: 1.5 10*3/uL — ABNORMAL HIGH (ref 0.1–1.0)
Monocytes Relative: 12 %
Neutro Abs: 9.2 10*3/uL — ABNORMAL HIGH (ref 1.7–7.7)
Neutrophils Relative %: 70 %
Platelets: 297 10*3/uL (ref 150–400)
RBC: 3.77 MIL/uL — ABNORMAL LOW (ref 3.87–5.11)
RDW: 14.8 % (ref 11.5–15.5)
WBC: 12.8 10*3/uL — ABNORMAL HIGH (ref 4.0–10.5)
nRBC: 0 % (ref 0.0–0.2)

## 2021-11-16 LAB — CULTURE, BLOOD (ROUTINE X 2)
Culture: NO GROWTH
Culture: NO GROWTH

## 2021-11-16 MED ORDER — TRAMADOL HCL 50 MG PO TABS
50.0000 mg | ORAL_TABLET | Freq: Three times a day (TID) | ORAL | Status: DC | PRN
  Administered 2021-11-16 – 2021-11-17 (×3): 50 mg via ORAL
  Filled 2021-11-16 (×4): qty 1

## 2021-11-16 MED ORDER — CLONAZEPAM 1 MG PO TABS
1.0000 mg | ORAL_TABLET | Freq: Three times a day (TID) | ORAL | Status: DC | PRN
  Administered 2021-11-17 (×2): 1 mg via ORAL
  Filled 2021-11-16 (×2): qty 1

## 2021-11-16 MED ORDER — LOPERAMIDE HCL 2 MG PO CAPS
4.0000 mg | ORAL_CAPSULE | Freq: Once | ORAL | Status: AC
  Administered 2021-11-16: 4 mg via ORAL
  Filled 2021-11-16: qty 2

## 2021-11-16 NOTE — Progress Notes (Signed)
Mobility Specialist - Progress Note ? ? 11/16/21 1300  ?Mobility  ?Activity Ambulated independently in hallway;Stood at bedside;Dangled on edge of bed  ?Level of Assistance Standby assist, set-up cues, supervision of patient - no hands on  ?Assistive Device None  ?Distance Ambulated (ft) 200 ft  ?Activity Response Tolerated well  ?$Mobility charge 1 Mobility  ? ?Pt sitting EOB upon arrival using RA. Pt agreeable to ambulation after encouragement from RN. Completed STS and ambulation with supervision --- one break d/t to mild SOB. Returned to EOB with needs in reach. ? ? ?During mobility: 92 HR, BP, 90% SpO2 ? ?Merrily Brittle ?Mobility Specialist ?11/16/21, 1:47 PM ? ? ?

## 2021-11-16 NOTE — Progress Notes (Signed)
Mobility Specialist - Progress Note ? ? ? 11/16/21 1200  ?Mobility  ?Activity Refused mobility  ? ? ?Pt sitting in chair upon arrival. Pt refuses session d/t diarrhea and not wanting to be discharged. Despite max encouragement Pt uniwlling to ambulate. RN notified. Pt left with needs in reach. ? ?Toni Berger ?Mobility Specialist ?11/16/21, 12:43 PM ? ? ? ? ?

## 2021-11-16 NOTE — Progress Notes (Addendum)
Progress Note    Toni Berger  ZOX:[id number] DOB: Sep 28, 1945  DOA: 11/11/2021 PCP: Leone Haven, MD      Brief Narrative:    Medical records reviewed and are as summarized below:  [name] is a 76 y.o. female with medical history significant of hypertension, hyperlipidemia, stroke, GERD, depression with anxiety, upper GI bleeding, migraine, syncope, CKD-2, who presented to the hospital with nausea, vomiting and abdominal pain.  She was found to have acute colitis.  She was treated with empiric IV antibiotics, analgesics and IV fluids.    Assessment/Plan:   Principal Problem:   Acute colitis Active Problems:   Remote lacunar infarction (HCC)   Anxiety and depression   GERD (gastroesophageal reflux disease)   CKD (chronic kidney disease), stage II   HTN (hypertension)    Body mass index is 32.11 kg/m.  (Obesity)  Acute colitis, leukocytosis: Improved.  Discontinue IV antibiotics.  Monitor off antibiotics..  Hypertension: Continue antihypertensive  Generalized weakness: PT recommends discharge to SNF but patient refuses SNF or home health services.  Continue PT and OT while inpatient.  She refused work with Production designer, theatre/television/film.  History of stroke: Continue Plavix  Loose stools: Imodium as needed if loose stools persist.  Other comorbidities include CAD, CKD stage II, anxiety and depression.   Patient was told that she was going to be discharged home today.  However, she said that she did not want to go home today because she was having diarrhea.   Diet Order             Diet Heart Room service appropriate? Yes; Fluid consistency: Thin  Diet effective now                     Consultants: None  Procedures: None    Medications:    amLODipine  5 mg Oral Q1500   clopidogrel  75 mg Oral Daily   enoxaparin (LOVENOX) injection  40 mg Subcutaneous Q24H   lisinopril  40 mg Oral Daily   pantoprazole  40 mg Oral BID AC    cyanocobalamin  1,000 mcg Oral QPM   Continuous Infusions:  piperacillin-tazobactam (ZOSYN)  IV 3.375 g (11/16/21 0501)     Anti-infectives (From admission, onward)    Start     Dose/Rate Route Frequency Ordered Stop   11/11/21 2200  piperacillin-tazobactam (ZOSYN) IVPB 3.375 g        3.375 g 12.5 mL/hr over 240 Minutes Intravenous Every 8 hours 11/11/21 1954     11/11/21 1715  piperacillin-tazobactam (ZOSYN) IVPB 3.375 g  Status:  Discontinued        3.375 g 12.5 mL/hr over 240 Minutes Intravenous Every 8 hours 11/11/21 1700 11/11/21 1953   11/11/21 1445  piperacillin-tazobactam (ZOSYN) IVPB 3.375 g        3.375 g 100 mL/hr over 30 Minutes Intravenous  Once 11/11/21 1442 11/11/21 1544              Family Communication/Anticipated D/C date and plan/Code Status   DVT prophylaxis: enoxaparin (LOVENOX) injection 40 mg Start: 11/11/21 2200     Code Status: DNR  Family Communication: None Disposition Plan: Plan to discharge home tomorrow   Status is: Inpatient Remains inpatient appropriate because: Generalized weakness, diarrhea               Subjective:   She said she wanted to take tramadol instead of oxycodone for pain because she takes tramadol at home.  No nausea, vomiting or abdominal pain.  Later in the day, patient told her nurse that she was having diarrhea.  Joelene Millin, RN, was at the bedside  Objective:    Vitals:   11/15/21 1936 11/16/21 0437 11/16/21 0809 11/16/21 1003  BP: 120/68 135/67 133/63 137/62  Pulse: 85 77 77 79  Resp: '16 20 18   '$ Temp: 99.4 F (37.4 C) 99.2 F (37.3 C) 98.9 F (37.2 C)   TempSrc:      SpO2: 95% 93% 95% 97%  Weight:      Height:       No data found.  No intake or output data in the 24 hours ending 11/16/21 1344  Filed Weights   11/11/21 1950  Weight: 93 kg    Exam:  GEN: NAD SKIN: No rash EYES: EOMI ENT: MMM CV: RRR PULM: CTA B ABD: soft, ND, NT, +BS CNS: AAO x 3, non focal EXT: No edema or  tenderness      Data Reviewed:   I have personally reviewed following labs and imaging studies:  Labs: Labs show the following:   Basic Metabolic Panel: Recent Labs  Lab 11/11/21 1227 11/12/21 0359 11/13/21 0346  NA 137 141 139  K 3.5 4.4 3.6  CL 101 104 105  CO2 '26 25 24  '$ GLUCOSE 120* 141* 122*  BUN '22 23 20  '$ CREATININE 1.10* 1.20* 0.99  CALCIUM 9.5 9.2 8.7*   GFR Estimated Creatinine Clearance: 57.5 mL/min (by C-G formula based on SCr of 0.99 mg/dL). Liver Function Tests: Recent Labs  Lab 11/11/21 1227  AST 21  ALT 17  ALKPHOS 110  BILITOT 0.6  PROT 7.9  ALBUMIN 4.0   Recent Labs  Lab 11/11/21 1227  LIPASE 57*   No results for input(s): AMMONIA in the last 168 hours. Coagulation profile No results for input(s): INR, PROTIME in the last 168 hours.  CBC: Recent Labs  Lab 11/12/21 0359 11/13/21 0346 11/14/21 0405 11/15/21 0402 11/16/21 0510  WBC 25.1* 31.4* 24.5* 17.2* 12.8*  NEUTROABS  --  26.3* 20.2* 13.9* 9.2*  HGB 14.5 13.1 11.7* 10.8* 10.3*  HCT 43.9 39.7 35.3* 33.8* 31.5*  MCV 82.5 82.5 84.0 83.9 83.6  PLT 375 328 267 289 297   Cardiac Enzymes: No results for input(s): CKTOTAL, CKMB, CKMBINDEX, TROPONINI in the last 168 hours. BNP (last 3 results) No results for input(s): PROBNP in the last 8760 hours. CBG: Recent Labs  Lab 11/12/21 0756 11/13/21 0810 11/14/21 0759  GLUCAP 124* 110* 93   D-Dimer: No results for input(s): DDIMER in the last 72 hours. Hgb A1c: No results for input(s): HGBA1C in the last 72 hours. Lipid Profile: No results for input(s): CHOL, HDL, LDLCALC, TRIG, CHOLHDL, LDLDIRECT in the last 72 hours. Thyroid function studies: No results for input(s): TSH, T4TOTAL, T3FREE, THYROIDAB in the last 72 hours.  Invalid input(s): FREET3 Anemia work up: No results for input(s): VITAMINB12, FOLATE, FERRITIN, TIBC, IRON, RETICCTPCT in the last 72 hours. Sepsis Labs: Recent Labs  Lab 11/13/21 0346 11/14/21 0405  11/15/21 0402 11/16/21 0510  WBC 31.4* 24.5* 17.2* 12.8*    Microbiology Recent Results (from the past 240 hour(s))  Resp Panel by RT-PCR (Flu A&B, Covid) Nasopharyngeal Swab     Status: None   Collection Time: 11/11/21  2:43 PM   Specimen: Nasopharyngeal Swab; Nasopharyngeal(NP) swabs in vial transport medium  Result Value Ref Range Status   SARS Coronavirus 2 by RT PCR NEGATIVE NEGATIVE Final    Comment: (NOTE)  SARS-CoV-2 target nucleic acids are NOT DETECTED.  The SARS-CoV-2 RNA is generally detectable in upper respiratory specimens during the acute phase of infection. The lowest concentration of SARS-CoV-2 viral copies this assay can detect is 138 copies/mL. A negative result does not preclude SARS-Cov-2 infection and should not be used as the sole basis for treatment or other patient management decisions. A negative result may occur with  improper specimen collection/handling, submission of specimen other than nasopharyngeal swab, presence of viral mutation(s) within the areas targeted by this assay, and inadequate number of viral copies(<138 copies/mL). A negative result must be combined with clinical observations, patient history, and epidemiological information. The expected result is Negative.  Fact Sheet for Patients:  EntrepreneurPulse.com.au  Fact Sheet for Healthcare Providers:  IncredibleEmployment.be  This test is no t yet approved or cleared by the [place] FDA and  has been authorized for detection and/or diagnosis of SARS-CoV-2 by FDA under an Emergency Use Authorization (EUA). This EUA will remain  in effect (meaning this test can be used) for the duration of the COVID-19 declaration under Section 564(b)(1) of the Act, 21 U.S.C.section 360bbb-3(b)(1), unless the authorization is terminated  or revoked sooner.       Influenza A by PCR NEGATIVE NEGATIVE Final   Influenza B by PCR NEGATIVE NEGATIVE Final     Comment: (NOTE) The Xpert Xpress SARS-CoV-2/FLU/RSV plus assay is intended as an aid in the diagnosis of influenza from Nasopharyngeal swab specimens and should not be used as a sole basis for treatment. Nasal washings and aspirates are unacceptable for Xpert Xpress SARS-CoV-2/FLU/RSV testing.  Fact Sheet for Patients: EntrepreneurPulse.com.au  Fact Sheet for Healthcare Providers: IncredibleEmployment.be  This test is not yet approved or cleared by the [place] FDA and has been authorized for detection and/or diagnosis of SARS-CoV-2 by FDA under an Emergency Use Authorization (EUA). This EUA will remain in effect (meaning this test can be used) for the duration of the COVID-19 declaration under Section 564(b)(1) of the Act, 21 U.S.C. section 360bbb-3(b)(1), unless the authorization is terminated or revoked.  Performed at [facility], [address] [place], [place] [address]   CULTURE, BLOOD (ROUTINE X 2) w Reflex to ID Panel     Status: None   Collection Time: 11/11/21  4:25 PM   Specimen: BLOOD  Result Value Ref Range Status   Specimen Description BLOOD RAC  Final   Special Requests BOTTLES DRAWN AEROBIC AND ANAEROBIC BCAV  Final   Culture   Final    NO GROWTH 5 DAYS Performed at [facility], [address] [place], [place] [address]    Report Status 11/16/2021 FINAL  Final  CULTURE, BLOOD (ROUTINE X 2) w Reflex to ID Panel     Status: None   Collection Time: 11/11/21  4:30 PM   Specimen: BLOOD  Result Value Ref Range Status   Specimen Description BLOOD RFR  Final   Special Requests BOTTLES DRAWN AEROBIC AND ANAEROBIC BCAV  Final   Culture   Final    NO GROWTH 5 DAYS Performed at [facility], [address], [place], [place] [address]    Report Status 11/16/2021 FINAL  Final    Procedures and diagnostic studies:  No results found.             LOS: 5 days   Mardell Suttles  [facility]   Pager on [email or web address]. If 7PM-7AM, please contact night-coverage at [email or web address]     11/16/2021, 1:44 PM

## 2021-11-17 LAB — CBC WITH DIFFERENTIAL/PLATELET
Abs Immature Granulocytes: 0.09 10*3/uL — ABNORMAL HIGH (ref 0.00–0.07)
Basophils Absolute: 0.1 10*3/uL (ref 0.0–0.1)
Basophils Relative: 1 %
Eosinophils Absolute: 0.3 10*3/uL (ref 0.0–0.5)
Eosinophils Relative: 2 %
HCT: 30.8 % — ABNORMAL LOW (ref 36.0–46.0)
Hemoglobin: 10.1 g/dL — ABNORMAL LOW (ref 12.0–15.0)
Immature Granulocytes: 1 %
Lymphocytes Relative: 13 %
Lymphs Abs: 1.6 10*3/uL (ref 0.7–4.0)
MCH: 27.2 pg (ref 26.0–34.0)
MCHC: 32.8 g/dL (ref 30.0–36.0)
MCV: 82.8 fL (ref 80.0–100.0)
Monocytes Absolute: 1.4 10*3/uL — ABNORMAL HIGH (ref 0.1–1.0)
Monocytes Relative: 11 %
Neutro Abs: 9.5 10*3/uL — ABNORMAL HIGH (ref 1.7–7.7)
Neutrophils Relative %: 72 %
Platelets: 309 10*3/uL (ref 150–400)
RBC: 3.72 MIL/uL — ABNORMAL LOW (ref 3.87–5.11)
RDW: 14.5 % (ref 11.5–15.5)
WBC: 12.9 10*3/uL — ABNORMAL HIGH (ref 4.0–10.5)
nRBC: 0 % (ref 0.0–0.2)

## 2021-11-17 LAB — MAGNESIUM: Magnesium: 2 mg/dL (ref 1.7–2.4)

## 2021-11-17 LAB — BASIC METABOLIC PANEL
Anion gap: 9 (ref 5–15)
BUN: 15 mg/dL (ref 8–23)
CO2: 26 mmol/L (ref 22–32)
Calcium: 8.5 mg/dL — ABNORMAL LOW (ref 8.9–10.3)
Chloride: 107 mmol/L (ref 98–111)
Creatinine, Ser: 0.72 mg/dL (ref 0.44–1.00)
GFR, Estimated: >60 mL/min (ref >60)
Glucose, Bld: 83 mg/dL (ref 70–99)
Potassium: 2.8 mmol/L — ABNORMAL LOW (ref 3.5–5.1)
Sodium: 142 mmol/L (ref 135–145)

## 2021-11-17 LAB — POTASSIUM: Potassium: 3.3 mmol/L — ABNORMAL LOW (ref 3.5–5.1)

## 2021-11-17 MED ORDER — POTASSIUM CHLORIDE CRYS ER 20 MEQ PO TBCR: 20.0000 meq | EXTENDED_RELEASE_TABLET | Freq: Every day | ORAL | 0 refills | Status: DC

## 2021-11-17 MED ORDER — POTASSIUM CHLORIDE CRYS ER 20 MEQ PO TBCR
40.0000 meq | EXTENDED_RELEASE_TABLET | Freq: Once | ORAL | Status: AC
  Administered 2021-11-17: 40 meq via ORAL
  Filled 2021-11-17: qty 2

## 2021-11-17 MED ORDER — POTASSIUM CHLORIDE CRYS ER 20 MEQ PO TBCR
40.0000 meq | EXTENDED_RELEASE_TABLET | ORAL | Status: DC
  Administered 2021-11-17: 40 meq via ORAL
  Filled 2021-11-17: qty 2

## 2021-11-17 NOTE — TOC Transition Note (Signed)
Transition of Care (TOC) - CM/SW Discharge Note ? ? ?Patient Details  ?Name: Toni Berger ?MRN: [id number] ?Date of Birth: August 26, 1946 ? ?Transition of Care (TOC) CM/SW Contact:  ?Beverly Sessions, RN ?Phone Number: ?11/17/2021, 1:02 PM ? ? ?Clinical Narrative:    ? ?Patient to discharge today ?States a friend will be picking her up, but that she has to discharge by 4.  RN and MD aware ? ?Patient declines all services and DME at discharge  ? ?  ?Barriers to Discharge: Continued Medical Work up ? ? ?Patient Goals and CMS Choice ?  ?  ?  ? ?Discharge Placement ?  ?           ?  ?  ?  ?  ? ?Discharge Plan and Services ?  ?Discharge Planning Services: CM Consult ?           ?  ?  ?  ?  ?  ?HH Arranged: Refused HH ?  ?  ?  ?  ? ?Social Determinants of Health (SDOH) Interventions ?  ? ? ?Readmission Risk Interventions ?No flowsheet data found. ? ? ? ? ?

## 2021-11-17 NOTE — Care Management Important Message (Signed)
Important Message ? ?Patient Details  ?Name: Toni Berger ?MRN: [id number] ?Date of Birth: 01-27-1946 ? ? ?Medicare Important Message Given:  Yes ? ? ? ? ?Dannette Barbara ?11/17/2021, 11:06 AM ?

## 2021-11-17 NOTE — Progress Notes (Signed)
OT Cancellation Note ? ?Patient Details ?Name: Toni Berger ?MRN: [id number] ?DOB: Nov 15, 1945 ? ? ?Cancelled Treatment:    Reason Eval/Treat Not Completed: Patient declined, stating she was "fine" and did not need/want any rehab services. She reports that she has been getting out of bed, walking in hallway, using BSC, without assistance or AD, throughout the weekend. Stated she was not interested in [facility]. Will sign off on OT at this time. ? ?Josiah Lobo, PhD, MS, OTR/L ?11/17/21, 9:42 AM ? ?

## 2021-11-17 NOTE — Discharge Summary (Signed)
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  Diet recommendation:  Diet Order             Diet - low sodium heart healthy           Diet Heart Room service appropriate? Yes; Fluid consistency: Thin  Diet effective now                     Code Status: DNR     Hospital Course:   [name] is a 76 y.o. female with medical history significant of hypertension, hyperlipidemia, stroke, GERD, depression with anxiety, upper GI bleeding, migraine, syncope, CKD-2, who presented to the hospital with nausea, vomiting and abdominal pain.  She was found to have acute colitis.  She was treated with empiric IV antibiotics, analgesics and IV fluids.  She also had hypokalemia that was treated.  Her condition has improved and she is deemed stable for discharge to home today.     Discharge Exam:    Vitals:   11/16/21 1823 11/16/21 1924 11/17/21 0431 11/17/21 0816  BP: (!) 148/83 (!) 170/76 (!) 135/57 (!) 142/67  Pulse: 79 86 79 72  Resp:  '18 18 18  '$ Temp:  98.2 F (36.8 C) 98.2 F (36.8 C) 98.4 F (36.9 C)  TempSrc:      SpO2: 100% 98% 93% 100%  Weight:      Height:         GEN: NAD SKIN: No rash EYES: EOMI ENT: MMM CV: RRR PULM: CTA B ABD: soft, ND, NT, +BS CNS: AAO x 3, non focal EXT: No edema or tenderness   The results of significant diagnostics from this hospitalization (including imaging, microbiology, ancillary and laboratory) are listed below  for reference.     Procedures and Diagnostic Studies:   CT ABDOMEN PELVIS W CONTRAST  Result Date: 11/11/2021 CLINICAL DATA:  Abdominal pain EXAM: CT ABDOMEN AND PELVIS WITH CONTRAST TECHNIQUE: Multidetector CT imaging of the abdomen and pelvis was performed using the standard protocol following bolus administration of intravenous contrast. RADIATION DOSE REDUCTION: This exam was performed according to the departmental dose-optimization program which includes automated exposure control, adjustment of the mA and/or kV according to patient size and/or use of iterative reconstruction technique. CONTRAST:  132m OMNIPAQUE IOHEXOL 300 MG/ML  SOLN COMPARISON:  None. FINDINGS: Lower chest: No acute abnormality. Hepatobiliary: Liver is normal in size and contour with no suspicious mass identified. Gallbladder is surgically absent. Mild biliary ductal prominence, likely compensatory from cholecystectomy. Pancreas: Unremarkable. No pancreatic ductal dilatation or surrounding inflammatory changes. Spleen: Normal in size without focal abnormality. Adrenals/Urinary Tract: 12 mm left adrenal gland nodule which is indeterminate. Right adrenal gland is normal. A few small renal cortical cysts identified. 3 mm nonobstructing calculus in the upper pole left kidney. No hydronephrosis identified bilaterally. Urinary bladder is normal. Stomach/Bowel: Moderate-sized hiatal hernia. No bowel obstruction, free air or pneumatosis. Colonic diverticulosis. Long segment of colonic wall thickening and minimal pericolonic fat stranding  visualized from the mid transverse colon to the distal descending colon. Moderate to large amount of retained fecal material throughout the colon. No evidence of acute appendicitis. Vascular/Lymphatic: No significant vascular findings are present. No enlarged abdominal or pelvic lymph nodes. Reproductive: Hysterectomy changes. 3.4 x 2.1 x 3.4 cm right adnexal cystic structure. Other: No ascites.  Musculoskeletal: No suspicious bony lesions identified. IMPRESSION: 1. Evidence of colitis from the mid transverse colon to the distal descending colon. 2. Colonic diverticulosis. 3. Right adnexal/ovarian cyst measuring up to 3.4 cm in size. Recommend nonemergent follow-up ultrasound. 4. Left renal calculus. 5. Moderate hiatal hernia. Electronically Signed   By: Ofilia Neas M.D.   On: 11/11/2021 14:26     Labs:   Basic Metabolic Panel: Recent Labs  Lab 11/11/21 1227 11/12/21 0359 11/13/21 0346 11/17/21 0508  NA 137 141 139 142  K 3.5 4.4 3.6 2.8*  CL 101 104 105 107  CO2 '26 25 24 26  '$ GLUCOSE 120* 141* 122* 83  BUN '22 23 20 15  '$ CREATININE 1.10* 1.20* 0.99 0.72  CALCIUM 9.5 9.2 8.7* 8.5*  MG  --   --   --  2.0   GFR Estimated Creatinine Clearance: 71.2 mL/min (by C-G formula based on SCr of 0.72 mg/dL). Liver Function Tests: Recent Labs  Lab 11/11/21 1227  AST 21  ALT 17  ALKPHOS 110  BILITOT 0.6  PROT 7.9  ALBUMIN 4.0   Recent Labs  Lab 11/11/21 1227  LIPASE 57*   No results for input(s): AMMONIA in the last 168 hours. Coagulation profile No results for input(s): INR, PROTIME in the last 168 hours.  CBC: Recent Labs  Lab 11/13/21 0346 11/14/21 0405 11/15/21 0402 11/16/21 0510 11/17/21 0508  WBC 31.4* 24.5* 17.2* 12.8* 12.9*  NEUTROABS 26.3* 20.2* 13.9* 9.2* 9.5*  HGB 13.1 11.7* 10.8* 10.3* 10.1*  HCT 39.7 35.3* 33.8* 31.5* 30.8*  MCV 82.5 84.0 83.9 83.6 82.8  PLT 328 267 289 297 309   Cardiac Enzymes: No results for input(s): CKTOTAL, CKMB, CKMBINDEX, TROPONINI in the last 168 hours. BNP: Invalid input(s): POCBNP CBG: Recent Labs  Lab 11/12/21 0756 11/13/21 0810 11/14/21 0759  GLUCAP 124* 110* 93   D-Dimer No results for input(s): DDIMER in the last 72 hours. Hgb A1c No results for input(s): HGBA1C in the last 72 hours. Lipid Profile No results for input(s): CHOL, HDL, LDLCALC, TRIG, CHOLHDL, LDLDIRECT in the last 72 hours. Thyroid  function studies No results for input(s): TSH, T4TOTAL, T3FREE, THYROIDAB in the last 72 hours.  Invalid input(s): FREET3 Anemia work up No results for input(s): VITAMINB12, FOLATE, FERRITIN, TIBC, IRON, RETICCTPCT in the last 72 hours. Microbiology Recent Results (from the past 240 hour(s))  Resp Panel by RT-PCR (Flu A&B, Covid) Nasopharyngeal Swab     Status: None   Collection Time: 11/11/21  2:43 PM   Specimen: Nasopharyngeal Swab; Nasopharyngeal(NP) swabs in vial transport medium  Result Value Ref Range Status   SARS Coronavirus 2 by RT PCR NEGATIVE NEGATIVE Final    Comment: (NOTE) SARS-CoV-2 target nucleic acids are NOT DETECTED.  The SARS-CoV-2 RNA is generally detectable in upper respiratory specimens during the acute phase of infection. The lowest concentration of SARS-CoV-2 viral copies this assay can detect is 138 copies/mL. A negative result does not preclude SARS-Cov-2 infection and should not be used as the sole basis for treatment or other patient management decisions. A negative result may occur with  improper specimen collection/handling, submission of specimen other than nasopharyngeal  swab, presence of viral mutation(s) within the areas targeted by this assay, and inadequate number of viral copies(<138 copies/mL). A negative result must be combined with clinical observations, patient history, and epidemiological information. The expected result is Negative.  Fact Sheet for Patients:  EntrepreneurPulse.com.au  Fact Sheet for Healthcare Providers:  IncredibleEmployment.be  This test is no t yet approved or cleared by the [place] FDA and  has been authorized for detection and/or diagnosis of SARS-CoV-2 by FDA under an Emergency Use Authorization (EUA). This EUA will remain  in effect (meaning this test can be used) for the duration of the COVID-19 declaration under Section 564(b)(1) of the Act, 21 U.S.C.section  360bbb-3(b)(1), unless the authorization is terminated  or revoked sooner.       Influenza A by PCR NEGATIVE NEGATIVE Final   Influenza B by PCR NEGATIVE NEGATIVE Final    Comment: (NOTE) The Xpert Xpress SARS-CoV-2/FLU/RSV plus assay is intended as an aid in the diagnosis of influenza from Nasopharyngeal swab specimens and should not be used as a sole basis for treatment. Nasal washings and aspirates are unacceptable for Xpert Xpress SARS-CoV-2/FLU/RSV testing.  Fact Sheet for Patients: EntrepreneurPulse.com.au  Fact Sheet for Healthcare Providers: IncredibleEmployment.be  This test is not yet approved or cleared by the [place] FDA and has been authorized for detection and/or diagnosis of SARS-CoV-2 by FDA under an Emergency Use Authorization (EUA). This EUA will remain in effect (meaning this test can be used) for the duration of the COVID-19 declaration under Section 564(b)(1) of the Act, 21 U.S.C. section 360bbb-3(b)(1), unless the authorization is terminated or revoked.  Performed at [facility], [address] [place], [place] [address]   CULTURE, BLOOD (ROUTINE X 2) w Reflex to ID Panel     Status: None   Collection Time: 11/11/21  4:25 PM   Specimen: BLOOD  Result Value Ref Range Status   Specimen Description BLOOD RAC  Final   Special Requests BOTTLES DRAWN AEROBIC AND ANAEROBIC BCAV  Final   Culture   Final    NO GROWTH 5 DAYS Performed at [facility], [address] [place], [place] [address]    Report Status 11/16/2021 FINAL  Final  CULTURE, BLOOD (ROUTINE X 2) w Reflex to ID Panel     Status: None   Collection Time: 11/11/21  4:30 PM   Specimen: BLOOD  Result Value Ref Range Status   Specimen Description BLOOD RFR  Final   Special Requests BOTTLES DRAWN AEROBIC AND ANAEROBIC BCAV  Final   Culture   Final    NO GROWTH 5 DAYS Performed at [facility], [address]  [place], [place] [address]    Report Status 11/16/2021 FINAL  Final     Discharge Instructions:   Discharge Instructions     Diet - low sodium heart healthy   Complete by: As directed       Allergies as of 11/17/2021       Reactions   Carbidopa-levodopa Other (See Comments)   REACTION: headache, stomach pain, chest pain and seeing purple colors   Prednisone    Throat swelling   Sulfonamide Derivatives    "stop breathing, eyes roll in back of head and fingers web"   Aquasonic 100 [ultrasound Gel] Itching   Ciprofloxacin    Pantoprazole    Povidone-iodine    Propoxyphene Hcl    Sertraline Hcl    headache   Tetracycline    Acetaminophen Diarrhea   Stomach pain  Metoprolol Other (See Comments)   To low heart rate/ BP   Pravastatin Other (See Comments)   Muscle cramps        Medication List     TAKE these medications    amLODipine 5 MG tablet Commonly known as: NORVASC Take 1 tablet (5 mg total) by mouth daily. What changed: when to take this   clonazePAM 1 MG tablet Commonly known as: KLONOPIN TAKE 1 TABLET BY MOUTH EVERY 8 TO 12 HOURS AS NEEDED. MAXIMUM 3 DOSES IN 24 HOURS   clopidogrel 75 MG tablet Commonly known as: PLAVIX Take 1 tablet (75 mg total) by mouth daily.   cyanocobalamin 1000 MCG tablet Take 1,000 mcg by mouth every evening.   lisinopril 40 MG tablet Commonly known as: ZESTRIL Take 1 tablet (40 mg total) by mouth daily.   pantoprazole 40 MG tablet Commonly known as: Protonix Take 1 tablet (40 mg total) by mouth 2 (two) times daily before a meal.   traMADol 50 MG tablet Commonly known as: ULTRAM TAKE 1 TABLET BY MOUTH EVERY 8 HOURS AS NEEDED FOR SEVERE PAIN           If you experience worsening of your admission symptoms, develop shortness of breath, life threatening emergency, suicidal or homicidal thoughts you must seek medical attention immediately by calling 911 or calling your MD immediately  if symptoms less severe.   You  must read complete instructions/literature along with all the possible adverse reactions/side effects for all the medicines you take and that have been prescribed to you. Take any new medicines after you have completely understood and accept all the possible adverse reactions/side effects.    Please note   You were cared for by a hospitalist during your hospital stay. If you have any questions about your discharge medications or the care you received while you were in the hospital after you are discharged, you can call the unit and asked to speak with the hospitalist on call if the hospitalist that took care of you is not available. Once you are discharged, your primary care physician will handle any further medical issues. Please note that NO REFILLS for any discharge medications will be authorized once you are discharged, as it is imperative that you return to your primary care physician (or establish a relationship with a primary care physician if you do not have one) for your aftercare needs so that they can reassess your need for medications and monitor your lab values.       Time coordinating discharge: Greater than 30 minutes  Signed:  Charlean Carneal  [facility] 11/17/2021, 12:51 PM   Pager on [email or web address]. If 7PM-7AM, please contact night-coverage at [email or web address]

## 2021-11-18 ENCOUNTER — Encounter: Payer: Self-pay | Admitting: Family Medicine

## 2021-11-18 ENCOUNTER — Telehealth: Payer: Self-pay

## 2021-11-18 NOTE — Telephone Encounter (Signed)
Transition Care Management Unsuccessful Follow-up Telephone Call ? ?Date of discharge and from where:  11/17/21 [facility] ? ?Attempts:  1st Attempt ? ?Reason for unsuccessful TCM follow-up call:  Left voice message ? ?  ?

## 2021-11-19 NOTE — Telephone Encounter (Signed)
Pt called in stating that someone left her a message about a hospital follow up. Advise Pt that our nurse health advisor left a message. Pt stated that she doesn't have any energy to come to the office for hospital follow. Pt stated that she can't even wash her self up or do anything for her self at this time do to no energy. Pt was wondering if she can do a Hosp F/U Virtually. Spoke with nina. Gae Bon advise Pt would need to come in office for [facility] F/U.  ?

## 2021-11-19 NOTE — Telephone Encounter (Signed)
Transition Care Management Unsuccessful Follow-up Telephone Call ? ?Date of discharge and from where:  11/17/21 [facility] ? ?Attempts:  2nd Attempt ? ?Reason for unsuccessful TCM follow-up call:  Unable to reach patient ? ?  ?

## 2021-11-19 NOTE — Telephone Encounter (Signed)
We can do hospital follow-up virtually if needed.  ?

## 2021-11-20 NOTE — Telephone Encounter (Signed)
Hospital follow  up is scheduled I called the patient and informed her that it was okay to do a virtual.  Kamon Fahr,cma  ?

## 2021-11-23 ENCOUNTER — Other Ambulatory Visit: Payer: Self-pay | Admitting: Family Medicine

## 2021-11-24 NOTE — Telephone Encounter (Signed)
Transition Care Management Unsuccessful Follow-up Telephone Call ? ?Date of discharge and from where:  11/17/21 [facility] ? ?Attempts:  3rd Attempt ? ?Reason for unsuccessful TCM follow-up call:  No answer/busy. Keep all scheduled appointments. VV scheduled 11/26/21 @ 4:30. ? ?  ?

## 2021-11-25 ENCOUNTER — Ambulatory Visit: Payer: Medicare Other | Admitting: Family Medicine

## 2021-11-25 DIAGNOSIS — Z20822 Contact with and (suspected) exposure to covid-19: Secondary | ICD-10-CM | POA: Diagnosis not present

## 2021-11-26 ENCOUNTER — Encounter: Payer: Self-pay | Admitting: Family Medicine

## 2021-11-26 ENCOUNTER — Telehealth: Payer: Self-pay

## 2021-11-26 ENCOUNTER — Other Ambulatory Visit: Payer: Self-pay

## 2021-11-26 ENCOUNTER — Ambulatory Visit (INDEPENDENT_AMBULATORY_CARE_PROVIDER_SITE_OTHER): Payer: Medicare Other | Admitting: Family Medicine

## 2021-11-26 DIAGNOSIS — E876 Hypokalemia: Secondary | ICD-10-CM | POA: Diagnosis not present

## 2021-11-26 DIAGNOSIS — D3502 Benign neoplasm of left adrenal gland: Secondary | ICD-10-CM | POA: Diagnosis not present

## 2021-11-26 DIAGNOSIS — N949 Unspecified condition associated with female genital organs and menstrual cycle: Secondary | ICD-10-CM

## 2021-11-26 DIAGNOSIS — K529 Noninfective gastroenteritis and colitis, unspecified: Secondary | ICD-10-CM | POA: Diagnosis not present

## 2021-11-26 NOTE — Progress Notes (Signed)
? ?Virtual Visit via video Note ? ?This visit type was conducted due to national recommendations for restrictions regarding the COVID-19 pandemic (e.g. social distancing).  This format is felt to be most appropriate for this patient at this time.  All issues noted in this document were discussed and addressed.  No physical exam was performed (except for noted visual exam findings with Video Visits).  ? ?I connected with Toni Berger today at  4:30 PM EDT by a video enabled telemedicine application and verified that I am speaking with the correct person using two identifiers. ?Location patient: home ?Location provider: work ?Persons participating in the virtual visit: patient, provider ? ?I discussed the limitations, risks, security and privacy concerns of performing an evaluation and management service by telephone and the availability of in person appointments. I also discussed with the patient that there may be a patient responsible charge related to this service. The patient expressed understanding and agreed to proceed. ? ?Reason for visit: f/u ? ?HPI: ?Colitis: Patient was hospitalized for colitis.  She was treated with IV antibiotics.  She was not discharged on any antibiotics.  She notes she had recurrence of diarrhea after she was placed on oxycodone though that has resolved at this time.  Her bowel movements have been back to her baseline.  She also reports taking a potassium supplement resulted in diarrhea.  She notes no abdominal pain, nausea, or vomiting at this time.  She is progressively advancing her diet.  CT abdomen and pelvis from 11/11/2021 reviewed.  Labs from recent hospitalization reviewed.  Patient was found to be hypokalemic in the hospital.  Discharge summary from her hospitalization reviewed. ? ?Adnexal cystic structure: This is noted on the CT abdomen and pelvis.  She reports that her ovaries were removed previously. ? ?Adrenal lesion: Lesion noted in left adrenal gland.  This was noted as  indeterminate.  This has been present previously.  Based on prior CT scan report this appears to be slightly smaller. ? ? ?ROS: See pertinent positives and negatives per HPI. ? ?Past Medical History:  ?Diagnosis Date  ? Allergy   ? Anxiety   ? Arthritis   ? Closed rib fracture 01/09/2016  ? Fibrocystic breast   ? Hyperlipidemia   ? Hypertension   ? Lipoma   ? 5/01 Abdominal wall lipoma  ? Loss of consciousness ([place]) 12/15/2015  ? Migraines   ? Osteoporosis, unspecified   ? Restless leg syndrome   ? Stroke (cerebrum) ([place])   ? Upper GI bleed 03/27/2019  ? Venous stasis dermatitis 01/19/2020  ? ? ?Past Surgical History:  ?Procedure Laterality Date  ? ABDOMINAL HYSTERECTOMY    ? APPENDECTOMY  1999  ? BREAST CYST ASPIRATION    ? CHOLECYSTECTOMY  1999  ? DOBUTAMINE STRESS ECHO  06/96  ? negative  ? ESOPHAGOGASTRODUODENOSCOPY N/A 03/30/2019  ? Procedure: ESOPHAGOGASTRODUODENOSCOPY (EGD);  Surgeon: Lin Landsman, MD;  Location: [facility] ENDOSCOPY;  Service: Gastroenterology;  Laterality: N/A;  ? GIVENS CAPSULE STUDY N/A 03/31/2019  ? Procedure: GIVENS CAPSULE STUDY;  Surgeon: Lin Landsman, MD;  Location: [facility] ENDOSCOPY;  Service: Gastroenterology;  Laterality: N/A;  ? TONSILLECTOMY AND ADENOIDECTOMY  1974  ? TOTAL ABDOMINAL HYSTERECTOMY W/ BILATERAL SALPINGOOPHORECTOMY  1981  ? ? ?Family History  ?Problem Relation Age of Onset  ? Heart disease Mother   ? Diabetes Mother   ? Aneurysm Mother   ?     AAA  ? Heart failure Mother   ? Heart failure Father   ?  Coronary artery disease Father   ? Colon cancer Neg Hx   ? Breast cancer Neg Hx   ? ? ?SOCIAL HX: Former smoker ? ? ?Current Outpatient Medications:  ?  amLODipine (NORVASC) 5 MG tablet, Take 1 tablet (5 mg total) by mouth daily., Disp: 90 tablet, Rfl: 1 ?  clonazePAM (KLONOPIN) 1 MG tablet, TAKE 1 TABLET BY MOUTH EVERY 8 TO 12 HOURS AS NEEDED. MAXIMUM 3 DOSES IN 24 HOURS, Disp: 90 tablet, Rfl: 0 ?  clopidogrel (PLAVIX) 75 MG tablet, Take 1 tablet (75 mg total) by mouth  daily., Disp: 90 tablet, Rfl: 3 ?  cyanocobalamin 1000 MCG tablet, Take 1,000 mcg by mouth every evening., Disp: , Rfl:  ?  lisinopril (ZESTRIL) 40 MG tablet, Take 1 tablet (40 mg total) by mouth daily., Disp: 90 tablet, Rfl: 3 ?  pantoprazole (PROTONIX) 40 MG tablet, Take 1 tablet (40 mg total) by mouth 2 (two) times daily before a meal., Disp: 180 tablet, Rfl: 0 ?  traMADol (ULTRAM) 50 MG tablet, TAKE 1 TABLET BY MOUTH EVERY 8 HOURS AS NEEDED FOR SEVERE PAIN, Disp: 90 tablet, Rfl: 0 ?  potassium chloride (KLOR-CON M) 20 MEQ tablet, Take 1 tablet (20 mEq total) by mouth daily for 3 days., Disp: 3 tablet, Rfl: 0 ? ?EXAM: ? ?VITALS per patient if applicable: ? ?GENERAL: alert, oriented, appears well and in no acute distress ? ?HEENT: atraumatic, conjunttiva clear, no obvious abnormalities on inspection of external nose and ears ? ?NECK: normal movements of the head and neck ? ?LUNGS: on inspection no signs of respiratory distress, breathing rate appears normal, no obvious gross SOB, gasping or wheezing ? ?CV: no obvious cyanosis ? ?MS: moves all visible extremities without noticeable abnormality ? ?PSYCH/NEURO: pleasant and cooperative, no obvious depression or anxiety, speech and thought processing grossly intact ? ?ASSESSMENT AND PLAN: ? ?Discussed the following assessment and plan: ? ?Problem List Items Addressed This Visit   ? ? Acute colitis  ?  Symptoms have resolved at this time.  Discussed monitoring for any recurrence of symptoms. ?  ?  ? Adnexal cyst  ?  New issue with uncertain prognosis.  Discussed the adnexal cystic structure seen on imaging.  I recommended that she undergo an ultrasound to evaluate this further.  Discussed that this could be a benign finding or malignant finding.  She notes regardless of the cause she would not undergo a procedure for this and was unsure if she wanted to proceed with the ultrasound at this time.  She will let us know if she would like to undergo the ultrasound.  I will  somebody reach out to her next week to follow-up on her decision. ?  ?  ? Adrenal adenoma, left  ?  Discussed potential follow-up imaging versus biopsy versus surgical procedure for this issue.  She notes that she would not undergo any kind of procedure on this.  I discussed if she is not going to have anything done about this further follow-up would not be indicated.  She is aware that this could represent a cancerous process or some other nonbenign process and notes that does not change her decision to decline further work-up. ?  ?  ? Hypokalemia  ?  Recheck labs. ?  ?  ? ? ?Return in about 1 week (around 12/03/2021) for Labs, 3 months PCP. ?  ?I discussed the assessment and treatment plan with the patient. The patient was provided an opportunity to ask questions and all were  answered. The patient agreed with the plan and demonstrated an understanding of the instructions. ?  ?The patient was advised to call back or seek an in-person evaluation if the symptoms worsen or if the condition fails to improve as anticipated. ? ? ? ?[name], MD  ? ?

## 2021-11-26 NOTE — Telephone Encounter (Signed)
Per pt request allergies updated ?

## 2021-11-28 ENCOUNTER — Encounter: Payer: Self-pay | Admitting: Family Medicine

## 2021-11-28 DIAGNOSIS — N949 Unspecified condition associated with female genital organs and menstrual cycle: Secondary | ICD-10-CM | POA: Insufficient documentation

## 2021-11-28 DIAGNOSIS — E876 Hypokalemia: Secondary | ICD-10-CM | POA: Insufficient documentation

## 2021-11-28 NOTE — Assessment & Plan Note (Signed)
Symptoms have resolved at this time.  Discussed monitoring for any recurrence of symptoms. ?

## 2021-11-28 NOTE — Assessment & Plan Note (Signed)
Discussed potential follow-up imaging versus biopsy versus surgical procedure for this issue.  She notes that she would not undergo any kind of procedure on this.  I discussed if she is not going to have anything done about this further follow-up would not be indicated.  She is aware that this could represent a cancerous process or some other nonbenign process and notes that does not change her decision to decline further work-up. ?

## 2021-11-28 NOTE — Assessment & Plan Note (Signed)
Recheck labs 

## 2021-11-28 NOTE — Assessment & Plan Note (Addendum)
New issue with uncertain prognosis.  Discussed the adnexal cystic structure seen on imaging.  I recommended that she undergo an ultrasound to evaluate this further.  Discussed that this could be a benign finding or malignant finding.  She notes regardless of the cause she would not undergo a procedure for this and was unsure if she wanted to proceed with the ultrasound at this time.  She will let us know if she would like to undergo the ultrasound.  I will somebody reach out to her next week to follow-up on her decision. ?

## 2021-12-08 ENCOUNTER — Encounter (HOSPITAL_COMMUNITY): Payer: Self-pay | Admitting: Radiology

## 2021-12-09 ENCOUNTER — Encounter: Payer: Self-pay | Admitting: Family Medicine

## 2021-12-16 DIAGNOSIS — Z20822 Contact with and (suspected) exposure to covid-19: Secondary | ICD-10-CM | POA: Diagnosis not present

## 2021-12-19 DIAGNOSIS — Z20822 Contact with and (suspected) exposure to covid-19: Secondary | ICD-10-CM | POA: Diagnosis not present

## 2021-12-22 ENCOUNTER — Telehealth: Payer: Self-pay | Admitting: Family

## 2021-12-24 MED ORDER — TRAMADOL HCL 50 MG PO TABS: ORAL_TABLET | ORAL | 0 refills | Status: DC

## 2021-12-24 MED ORDER — CLONAZEPAM 1 MG PO TABS
ORAL_TABLET | ORAL | 0 refills | Status: DC
Start: 2021-12-24 — End: 2022-01-19

## 2021-12-24 NOTE — Addendum Note (Signed)
Addended by: Leone Haven on: 12/24/2021 04:48 PM ? ? Modules accepted: Orders ? ?

## 2021-12-24 NOTE — Telephone Encounter (Signed)
Sent to the pharmacy

## 2021-12-24 NOTE — Telephone Encounter (Signed)
Pt called in requesting refill on medication (traMADol (ULTRAM) 50 MG tablet) and (clonazePAM (KLONOPIN) 1 MG tablet)... Pt stated that the pharmacy sent over information to office and they advised Pt that the office never responded... Pt requesting callback ?

## 2021-12-25 ENCOUNTER — Encounter: Payer: Self-pay | Admitting: Podiatry

## 2021-12-25 ENCOUNTER — Ambulatory Visit (INDEPENDENT_AMBULATORY_CARE_PROVIDER_SITE_OTHER): Payer: Medicare Other | Admitting: Podiatry

## 2021-12-25 DIAGNOSIS — M79609 Pain in unspecified limb: Secondary | ICD-10-CM

## 2021-12-25 DIAGNOSIS — Q828 Other specified congenital malformations of skin: Secondary | ICD-10-CM

## 2021-12-25 DIAGNOSIS — D689 Coagulation defect, unspecified: Secondary | ICD-10-CM | POA: Diagnosis not present

## 2021-12-25 DIAGNOSIS — R051 Acute cough: Secondary | ICD-10-CM | POA: Diagnosis not present

## 2021-12-25 DIAGNOSIS — B351 Tinea unguium: Secondary | ICD-10-CM | POA: Diagnosis not present

## 2021-12-25 DIAGNOSIS — R059 Cough, unspecified: Secondary | ICD-10-CM | POA: Diagnosis not present

## 2021-12-25 DIAGNOSIS — Z20822 Contact with and (suspected) exposure to covid-19: Secondary | ICD-10-CM | POA: Diagnosis not present

## 2021-12-25 NOTE — Progress Notes (Signed)
This patient returns to my office for at risk foot care.  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  This patient is unable to cut nails herself since the patient cannot reach her nails.These nails are painful walking and wearing shoes.  Patient has forefoot callus left foot which she is wearing dispersion padding. This patient presents for at risk foot care today. ? ?General Appearance  Alert, conversant and in no acute stress. ? ?Vascular  Dorsalis pedis and posterior tibial  pulses are palpable  bilaterally.  Capillary return is within normal limits  bilaterally. Temperature is within normal limits  bilaterally. ? ?Neurologic  Senn-Weinstein monofilament wire test within normal limits  bilaterally. Muscle power within normal limits bilaterally. ? ?Nails Thick disfigured discolored nails with subungual debris  Hallux bilaterally.. No evidence of bacterial infection or drainage bilaterally. ? ?Orthopedic  No limitations of motion  feet .  No crepitus or effusions noted.  No bony pathology or digital deformities noted. ? ?Skin  normotropic skin  noted bilaterally.  No signs of infections or ulcers noted.   Porokeratosis sub 2 left foot. ? ?Onychomycosis  Pain in right toes  Pain in left toes  Porokeratosis  Left forefoot. ? ?Consent was obtained for treatment procedures.   Mechanical debridement of nails 1-5  bilaterally performed with a nail nipper.  Filed with dremel without incident.  Debride callus with # 15 blade.  ? ? ?Return office visit   10 weeks                   Told patient to return for periodic foot care and evaluation due to potential at risk complications. ? ? ?Gardiner Barefoot DPM  ?

## 2021-12-29 DIAGNOSIS — Z20822 Contact with and (suspected) exposure to covid-19: Secondary | ICD-10-CM | POA: Diagnosis not present

## 2021-12-31 ENCOUNTER — Encounter: Payer: Self-pay | Admitting: Emergency Medicine

## 2021-12-31 ENCOUNTER — Observation Stay
Admission: EM | Admit: 2021-12-31 | Discharge: 2022-01-01 | Disposition: A | Discharge status: Home / Self Care | Payer: Medicare Other | Attending: Emergency Medicine | Admitting: Emergency Medicine

## 2021-12-31 ENCOUNTER — Other Ambulatory Visit: Payer: Self-pay

## 2021-12-31 DIAGNOSIS — K625 Hemorrhage of anus and rectum: Secondary | ICD-10-CM

## 2021-12-31 DIAGNOSIS — K922 Gastrointestinal hemorrhage, unspecified: Principal | ICD-10-CM | POA: Diagnosis present

## 2021-12-31 DIAGNOSIS — K219 Gastro-esophageal reflux disease without esophagitis: Secondary | ICD-10-CM | POA: Diagnosis present

## 2021-12-31 DIAGNOSIS — R9431 Abnormal electrocardiogram [ECG] [EKG]: Secondary | ICD-10-CM | POA: Diagnosis not present

## 2021-12-31 DIAGNOSIS — F32A Depression, unspecified: Secondary | ICD-10-CM

## 2021-12-31 DIAGNOSIS — I10 Essential (primary) hypertension: Secondary | ICD-10-CM | POA: Insufficient documentation

## 2021-12-31 DIAGNOSIS — Z7902 Long term (current) use of antithrombotics/antiplatelets: Secondary | ICD-10-CM | POA: Diagnosis not present

## 2021-12-31 DIAGNOSIS — F419 Anxiety disorder, unspecified: Secondary | ICD-10-CM

## 2021-12-31 DIAGNOSIS — Z87891 Personal history of nicotine dependence: Secondary | ICD-10-CM | POA: Diagnosis not present

## 2021-12-31 DIAGNOSIS — R58 Hemorrhage, not elsewhere classified: Secondary | ICD-10-CM | POA: Diagnosis not present

## 2021-12-31 DIAGNOSIS — Z8673 Personal history of transient ischemic attack (TIA), and cerebral infarction without residual deficits: Secondary | ICD-10-CM | POA: Insufficient documentation

## 2021-12-31 DIAGNOSIS — Z90710 Acquired absence of both cervix and uterus: Secondary | ICD-10-CM | POA: Diagnosis not present

## 2021-12-31 DIAGNOSIS — Z79899 Other long term (current) drug therapy: Secondary | ICD-10-CM | POA: Diagnosis not present

## 2021-12-31 DIAGNOSIS — N939 Abnormal uterine and vaginal bleeding, unspecified: Secondary | ICD-10-CM | POA: Diagnosis present

## 2021-12-31 LAB — WET PREP, GENITAL
Clue Cells Wet Prep HPF POC: NONE SEEN
Sperm: NONE SEEN
Trich, Wet Prep: NONE SEEN
WBC, Wet Prep HPF POC: <10 (ref <10)
Yeast Wet Prep HPF POC: NONE SEEN

## 2021-12-31 LAB — URINALYSIS, ROUTINE W REFLEX MICROSCOPIC
Bacteria, UA: NONE SEEN
Bilirubin Urine: NEGATIVE
Glucose, UA: NEGATIVE mg/dL
Ketones, ur: NEGATIVE mg/dL
Leukocytes,Ua: NEGATIVE
Nitrite: NEGATIVE
Protein, ur: NEGATIVE mg/dL
Specific Gravity, Urine: 1.006 (ref 1.005–1.030)
pH: 6 (ref 5.0–8.0)

## 2021-12-31 LAB — CBC WITH DIFFERENTIAL/PLATELET
Abs Immature Granulocytes: 0.03 10*3/uL (ref 0.00–0.07)
Basophils Absolute: 0.1 10*3/uL (ref 0.0–0.1)
Basophils Relative: 1 %
Eosinophils Absolute: 0.1 10*3/uL (ref 0.0–0.5)
Eosinophils Relative: 1 %
HCT: 37.6 % (ref 36.0–46.0)
Hemoglobin: 12 g/dL (ref 12.0–15.0)
Immature Granulocytes: 0 %
Lymphocytes Relative: 16 %
Lymphs Abs: 1.3 10*3/uL (ref 0.7–4.0)
MCH: 26.8 pg (ref 26.0–34.0)
MCHC: 31.9 g/dL (ref 30.0–36.0)
MCV: 84.1 fL (ref 80.0–100.0)
Monocytes Absolute: 0.9 10*3/uL (ref 0.1–1.0)
Monocytes Relative: 11 %
Neutro Abs: 5.7 10*3/uL (ref 1.7–7.7)
Neutrophils Relative %: 71 %
Platelets: 370 10*3/uL (ref 150–400)
RBC: 4.47 MIL/uL (ref 3.87–5.11)
RDW: 14.8 % (ref 11.5–15.5)
WBC: 8 10*3/uL (ref 4.0–10.5)
nRBC: 0 % (ref 0.0–0.2)

## 2021-12-31 LAB — PROTIME-INR
INR: 1 (ref 0.8–1.2)
Prothrombin Time: 13.5 seconds (ref 11.4–15.2)

## 2021-12-31 LAB — BASIC METABOLIC PANEL
Anion gap: 11 (ref 5–15)
BUN: 29 mg/dL — ABNORMAL HIGH (ref 8–23)
CO2: 23 mmol/L (ref 22–32)
Calcium: 9.3 mg/dL (ref 8.9–10.3)
Chloride: 105 mmol/L (ref 98–111)
Creatinine, Ser: 1.02 mg/dL — ABNORMAL HIGH (ref 0.44–1.00)
GFR, Estimated: 57 mL/min — ABNORMAL LOW (ref >60)
Glucose, Bld: 115 mg/dL — ABNORMAL HIGH (ref 70–99)
Potassium: 4 mmol/L (ref 3.5–5.1)
Sodium: 139 mmol/L (ref 135–145)

## 2021-12-31 MED ORDER — CLONAZEPAM 0.25 MG PO TBDP
0.2500 mg | ORAL_TABLET | Freq: Three times a day (TID) | ORAL | Status: DC | PRN
  Administered 2022-01-01: 0.25 mg via ORAL
  Filled 2021-12-31 (×2): qty 1

## 2021-12-31 MED ORDER — SODIUM CHLORIDE 0.9 % IV SOLN: INTRAVENOUS | Status: DC

## 2021-12-31 MED ORDER — LISINOPRIL 20 MG PO TABS
40.0000 mg | ORAL_TABLET | Freq: Every day | ORAL | Status: DC
  Administered 2022-01-01: 40 mg via ORAL
  Filled 2021-12-31: qty 2

## 2021-12-31 MED ORDER — AMLODIPINE BESYLATE 5 MG PO TABS
5.0000 mg | ORAL_TABLET | Freq: Every day | ORAL | Status: DC
Start: 2022-01-01 — End: 2022-01-01
  Filled 2021-12-31: qty 1

## 2021-12-31 MED ORDER — PANTOPRAZOLE SODIUM 40 MG PO TBEC: 40.0000 mg | DELAYED_RELEASE_TABLET | Freq: Two times a day (BID) | ORAL | Status: DC

## 2021-12-31 MED ORDER — TRAMADOL HCL 50 MG PO TABS
50.0000 mg | ORAL_TABLET | Freq: Four times a day (QID) | ORAL | Status: DC | PRN
  Administered 2022-01-01: 50 mg via ORAL
  Filled 2021-12-31 (×2): qty 1

## 2021-12-31 MED ORDER — ONDANSETRON HCL 4 MG PO TABS: 4.0000 mg | ORAL_TABLET | Freq: Four times a day (QID) | ORAL | Status: DC | PRN

## 2021-12-31 MED ORDER — MAGNESIUM HYDROXIDE 400 MG/5ML PO SUSP: 30.0000 mL | Freq: Every day | ORAL | Status: DC | PRN

## 2021-12-31 MED ORDER — VITAMIN B-12 1000 MCG PO TABS: 1000.0000 ug | ORAL_TABLET | Freq: Every evening | ORAL | Status: DC

## 2021-12-31 MED ORDER — ONDANSETRON HCL 4 MG/2ML IJ SOLN: 4.0000 mg | Freq: Four times a day (QID) | INTRAMUSCULAR | Status: DC | PRN

## 2021-12-31 MED ORDER — TRAZODONE HCL 50 MG PO TABS
25.0000 mg | ORAL_TABLET | Freq: Every evening | ORAL | Status: DC | PRN
  Filled 2021-12-31: qty 1

## 2021-12-31 NOTE — ED Provider Notes (Signed)
? ?[facility] ?Provider Note ? ? ? Event Date/Time  ? First MD Initiated Contact with Patient 12/31/21 2009   ?  (approximate) ? ? ?History  ? ?Vaginal Bleeding ? ? ?HPI ? ?Toni Berger is a 76 y.o. female CVA on Plavix, hypertension, hyperlipidemia and as listed in EMR presents to the emergency department for treatment and evaluation of vaginal bleeding.  Patient states that when she got out of the shower she was drying off and noted bright red blood on her inner thighs and then placed a towel between her legs and the towel became saturated with blood.  She also soaked up enough blood to saturate an entire roll of paper towels.  She then put on depends and soaked through 2 which at that point she decided to come to the emergency department.  She is concerned because she has had a total hysterectomy and cannot figure out why she would be bleeding from the vagina.  She denies placing anything inside prior to or during her shower.  She is abstinent after her husband passed away. ? ?  ? ? ?Physical Exam  ? ?Triage Vital Signs: ?ED Triage Vitals  ?Enc Vitals Group  ?   BP 12/31/21 1957 (!) 185/96  ?   Pulse Rate 12/31/21 1957 86  ?   Resp 12/31/21 1957 18  ?   Temp 12/31/21 1957 99.1 ?F (37.3 ?C)  ?   Temp Source 12/31/21 1957 Oral  ?   SpO2 12/31/21 1957 100 %  ?   Weight 12/31/21 2000 206 lb 5.6 oz (93.6 kg)  ?   Height 12/31/21 2000 '5\' 7"'$  (1.702 m)  ?   Head Circumference --   ?   Peak Flow --   ?   Pain Score 12/31/21 2000 0  ?   Pain Loc --   ?   Pain Edu? --   ?   Excl. in [place]? --   ? ? ?Most recent vital signs: ?Vitals:  ? 12/31/21 1957 12/31/21 2100  ?BP: (!) 185/96 (!) 159/84  ?Pulse: 86 83  ?Resp: 18 15  ?Temp: 99.1 ?F (37.3 ?C)   ?SpO2: 100% 100%  ? ? ?General: Awake, no distress.  ?CV:  Good peripheral perfusion.  ?Resp:  Normal effort.  ?Abd:  No distention.  ?Other:  Pelvic exam reveals no blood in the vaginal vault.  No blood at the urethral meatus.  No rectal bleeding. ? ? ?ED  Results / Procedures / Treatments  ? ?Labs ?(all labs ordered are listed, but only abnormal results are displayed) ?Labs Reviewed  ?BASIC METABOLIC PANEL - Abnormal; Notable for the following components:  ?    Result Value  ? Glucose, Bld 115 (*)   ? BUN 29 (*)   ? Creatinine, Ser 1.02 (*)   ? GFR, Estimated 57 (*)   ? All other components within normal limits  ?URINALYSIS, ROUTINE W REFLEX MICROSCOPIC - Abnormal; Notable for the following components:  ? Color, Urine STRAW (*)   ? APPearance CLEAR (*)   ? Hgb urine dipstick LARGE (*)   ? All other components within normal limits  ?WET PREP, GENITAL  ?CBC WITH DIFFERENTIAL/PLATELET  ?PROTIME-INR  ? ? ? ?EKG ? ?Not indicated ? ? ?RADIOLOGY ? ?Image and radiology report reviewed by me. ? ?Not indicated ? ?PROCEDURES: ? ?Critical Care performed: No ? ?Procedures ? ? ?MEDICATIONS ORDERED IN ED: ?Medications - No data to display ? ? ?IMPRESSION / MDM /  ASSESSMENT AND PLAN / ED COURSE  ? ?I have reviewed the triage note. ? ?Differential diagnosis includes, but is not limited to: Vaginal bleeding, rectal bleeding, hematuria ? ?76 year old female presenting to the emergency department for evaluation after experiencing vaginal bleeding this evening after her shower.  See HPI for further details. ? ?Patient is quite sure that the blood was coming from her vagina and not her rectum.  She denies previous vaginal or rectal bleeding.  She does have a history of diverticulosis.  She has had a complete hysterectomy.  Exam is overall reassuring however there was bright red blood on the depends undergarment that she took off before exam. ? ?Labs are reassuring.  Hemoglobin and hematocrit are normal.  Mild bump in her BUN and creatinine but otherwise her BMP is reassuring.  CBC is unremarkable.  Urinalysis does show a large amount of hemoglobin but no gross hematuria was noted when looking at the specimen. ? ?Patient evaluated by ED attending, Dr. Duffy Bruce who discussed with the  patient admission for observation.  This is most likely a painless diverticular bleed that has subsided.  But because she is on Plavix will require observation. Hospitalist consult requested. ? ?  ? ? ?FINAL CLINICAL IMPRESSION(S) / ED DIAGNOSES  ? ?Final diagnoses:  ?Bright red rectal bleeding  ? ? ? ?Rx / DC Orders  ? ?ED Discharge Orders   ? ? None  ? ?  ? ? ? ?Note:  This document was prepared using Dragon voice recognition software and may include unintentional dictation errors. ?  ?Victorino Dike, FNP ?12/31/21 2245 ? ?  ?Duffy Bruce, MD ?01/04/22 1106 ? ?

## 2021-12-31 NOTE — H&P (Signed)
?  ?  ?[place] ? ? ?PATIENT NAME: Toni Berger   ? ?MR#:  [id number] ? ?DATE OF BIRTH:  August 06, 1946 ? ?DATE OF ADMISSION:  12/31/2021 ? ?PRIMARY CARE PHYSICIAN: Leone Haven, MD  ? ?Patient is coming from: Home ? ?REQUESTING/REFERRING PHYSICIAN: Victorino Dike, FNP  ? ?CHIEF COMPLAINT:  ? ?Chief Complaint  ?Patient presents with  ? Vaginal Bleeding  ? ? ?HISTORY OF PRESENT ILLNESS:  ?Toni Berger is a 76 y.o. female with medical history significant for multiple medical problems that are mentioned below, presented to emergency room with acute onset of suspected vaginal bleeding though the patient is status post hysterectomy.  The patient took a shower and after coming down to and drying herself she noted blood soaking her towel.  She is on Plavix.  She denies any nausea or vomiting or abdominal pain.  She has been having dyspnea on exertion with with no cough or wheezing.  No fever or chills.  No dysuria, oliguria or hematuria or flank pain. ?ED Course: When she came to the ER BP was 185/96 with otherwise normal vital signs. ?Labs revealed a BUN of 29 and creatinine 1.09 and CBC was within normal.  UA was unremarkable ?EKG as reviewed by me : EKG showed normal sinus rhythm with a rate of 81 ?The patient will be admitted to an observation medical telemetry bed for further evaluation and monitoring. ?PAST MEDICAL HISTORY:  ? ?Past Medical History:  ?Diagnosis Date  ? Allergy   ? Anxiety   ? Arthritis   ? Closed rib fracture 01/09/2016  ? Fibrocystic breast   ? Hyperlipidemia   ? Hypertension   ? Lipoma   ? 5/01 Abdominal wall lipoma  ? Loss of consciousness ([place]) 12/15/2015  ? Migraines   ? Osteoporosis, unspecified   ? Restless leg syndrome   ? Stroke (cerebrum) ([place])   ? Upper GI bleed 03/27/2019  ? Venous stasis dermatitis 01/19/2020  ? ? ?PAST SURGICAL HISTORY:  ? ?Past Surgical History:  ?Procedure Laterality Date  ? ABDOMINAL HYSTERECTOMY    ? APPENDECTOMY  1999  ? BREAST CYST ASPIRATION    ? CHOLECYSTECTOMY   1999  ? DOBUTAMINE STRESS ECHO  06/96  ? negative  ? ESOPHAGOGASTRODUODENOSCOPY N/A 03/30/2019  ? Procedure: ESOPHAGOGASTRODUODENOSCOPY (EGD);  Surgeon: Lin Landsman, MD;  Location: [facility] ENDOSCOPY;  Service: Gastroenterology;  Laterality: N/A;  ? GIVENS CAPSULE STUDY N/A 03/31/2019  ? Procedure: GIVENS CAPSULE STUDY;  Surgeon: Lin Landsman, MD;  Location: [facility] ENDOSCOPY;  Service: Gastroenterology;  Laterality: N/A;  ? TONSILLECTOMY AND ADENOIDECTOMY  1974  ? TOTAL ABDOMINAL HYSTERECTOMY W/ BILATERAL SALPINGOOPHORECTOMY  1981  ? ? ?SOCIAL HISTORY:  ? ?Social History  ? ?Tobacco Use  ? Smoking status: Former  ? Smokeless tobacco: Never  ?Substance Use Topics  ? Alcohol use: No  ? ? ?FAMILY HISTORY:  ? ?Family History  ?Problem Relation Age of Onset  ? Heart disease Mother   ? Diabetes Mother   ? Aneurysm Mother   ?     AAA  ? Heart failure Mother   ? Heart failure Father   ? Coronary artery disease Father   ? Colon cancer Neg Hx   ? Breast cancer Neg Hx   ? ? ?DRUG ALLERGIES:  ? ?Allergies  ?Allergen Reactions  ? Carbidopa-Levodopa Other (See Comments)  ?  REACTION: headache, stomach pain, chest pain and seeing purple colors  ? Prednisone   ?  Throat swelling ?  ?  Sulfonamide Derivatives   ?  "stop breathing, eyes roll in back of head and fingers web"  ? Aquasonic 100 [Ultrasound Gel] Itching  ? Ciprofloxacin   ? Oxycodone Diarrhea  ? Potassium Diarrhea  ? Povidone-Iodine   ? Propoxyphene Hcl   ? Sertraline Hcl   ?  headache  ? Tetracycline   ? Acetaminophen Diarrhea  ?  Stomach pain  ? Metoprolol Other (See Comments)  ?  To low heart rate/ BP  ? Pravastatin Other (See Comments)  ?  Muscle cramps  ? ? ?REVIEW OF SYSTEMS:  ? ?ROS ?As per history of present illness. All pertinent systems were reviewed above. Constitutional, HEENT, cardiovascular, respiratory, GI, GU, musculoskeletal, neuro, psychiatric, endocrine, integumentary and hematologic systems were reviewed and are otherwise negative/unremarkable  except for positive findings mentioned above in the HPI. ? ? ?MEDICATIONS AT HOME:  ? ?Prior to Admission medications   ?Medication Sig Start Date End Date Taking? Authorizing Provider  ?amLODipine (NORVASC) 5 MG tablet Take 1 tablet (5 mg total) by mouth daily. 06/26/21   Leone Haven, MD  ?clonazePAM (KLONOPIN) 1 MG tablet TAKE 1 TABLET BY MOUTH EVERY 8 TO 12 HOURS AS NEEDED. MAXIMUM 3 DOSES IN 24 HOURS 12/24/21   Leone Haven, MD  ?clopidogrel (PLAVIX) 75 MG tablet Take 1 tablet (75 mg total) by mouth daily. 06/26/21   Leone Haven, MD  ?cyanocobalamin 1000 MCG tablet Take 1,000 mcg by mouth every evening.    [provider]  ?lisinopril (ZESTRIL) 40 MG tablet Take 1 tablet (40 mg total) by mouth daily. 06/26/21   Leone Haven, MD  ?pantoprazole (PROTONIX) 40 MG tablet Take 1 tablet (40 mg total) by mouth 2 (two) times daily before a meal. 11/04/21   Leone Haven, MD  ?traMADol (ULTRAM) 50 MG tablet TAKE 1 TABLET BY MOUTH EVERY 8 HOURS AS NEEDED FOR SEVERE PAIN 12/24/21   Leone Haven, MD  ? ?  ? ?VITAL SIGNS:  ?Blood pressure (!) 166/82, pulse 71, temperature 98.1 ?F (36.7 ?C), temperature source Oral, resp. rate 17, height '5\' 7"'$  (1.702 m), weight 93.6 kg, SpO2 98 %. ? ?PHYSICAL EXAMINATION:  ?Physical Exam ? ?GENERAL:  76 y.o.-year-old Caucasian female patient lying in the bed with no acute distress.  ?EYES: Pupils equal, round, reactive to light and accommodation. No scleral icterus. Extraocular muscles intact.  ?HEENT: Head atraumatic, normocephalic. Oropharynx and nasopharynx clear.  ?NECK:  Supple, no jugular venous distention. No thyroid enlargement, no tenderness.  ?LUNGS: Normal breath sounds bilaterally, no wheezing, rales,rhonchi or crepitation. No use of accessory muscles of respiration.  ?CARDIOVASCULAR: Regular rate and rhythm, S1, S2 normal. No murmurs, rubs, or gallops.  ?ABDOMEN: Soft, nondistended, nontender. Bowel sounds present. No organomegaly or  mass.  ?EXTREMITIES: No pedal edema, cyanosis, or clubbing.  ?NEUROLOGIC: Cranial nerves II through XII are intact. Muscle strength 5/5 in all extremities. Sensation intact. Gait not checked.  ?PSYCHIATRIC: The patient is alert and oriented x 3.  Normal affect and good eye contact. ?SKIN: No obvious rash, lesion, or ulcer.  ? ?LABORATORY PANEL:  ? ?CBC ?Recent Labs  ?Lab 01/01/22 ?0410  ?WBC 9.0  ?HGB 11.4*  ?HCT 35.5*  ?PLT 342  ? ?------------------------------------------------------------------------------------------------------------------ ? ?Chemistries  ?Recent Labs  ?Lab 01/01/22 ?0410  ?NA 141  ?K 3.7  ?CL 109  ?CO2 26  ?GLUCOSE 96  ?BUN 25*  ?CREATININE 0.89  ?CALCIUM 9.1  ? ?------------------------------------------------------------------------------------------------------------------ ? ?Cardiac Enzymes ?No results for input(s): TROPONINI in  the last 168 hours. ?------------------------------------------------------------------------------------------------------------------ ? ?RADIOLOGY:  ?No results found. ? ? ? ?IMPRESSION AND PLAN:  ?Assessment and Plan: ?* GI bleeding ?- The patient will be admitted to an observation medical telemetry bed. ?- We will follow serial hemoglobins and hematocrits. ?- GI consult will be obtained. ?- I notified Dr. Alice Reichert about the patient. ? ?GERD (gastroesophageal reflux disease) ?- We will continue PPI therapy ? ?HYPERTENSION, BENIGN ?- We will continue amlodipine and Zestril ? ?Anxiety ?- We will continue Klonopin. ? ? ?DVT prophylaxis: SCDs.   ?Advanced Care Planning:  Code Status: DNR/DNI.  This was discussed with her. ?Family Communication:  The plan of care was discussed in details with the patient (and family). ?I answered all questions. The patient agreed to proceed with the above mentioned plan. Further management will depend upon hospital course. ?Disposition Plan: Back to previous home environment ?Consults called: GI consult ?All the records are reviewed  and case discussed with ED provider. ? ?Status is: Observation ? ?I certify that at the time of admission, it is my clinical judgment that the patient will require hospital care extending less than 2 midni

## 2021-12-31 NOTE — ED Triage Notes (Signed)
Pt bib EMS a&Ox4- pt stepped out of the shower and had significant amount of blood come from vagina. Pt states it soaked multiple towels and 2 depends. Pt hx of hysterectomy. Pt denies any pain.  ?

## 2022-01-01 ENCOUNTER — Telehealth: Payer: Self-pay | Admitting: Family Medicine

## 2022-01-01 DIAGNOSIS — K922 Gastrointestinal hemorrhage, unspecified: Secondary | ICD-10-CM | POA: Diagnosis not present

## 2022-01-01 LAB — CBC
HCT: 35.5 % — ABNORMAL LOW (ref 36.0–46.0)
Hemoglobin: 11.4 g/dL — ABNORMAL LOW (ref 12.0–15.0)
MCH: 27 pg (ref 26.0–34.0)
MCHC: 32.1 g/dL (ref 30.0–36.0)
MCV: 83.9 fL (ref 80.0–100.0)
Platelets: 342 10*3/uL (ref 150–400)
RBC: 4.23 MIL/uL (ref 3.87–5.11)
RDW: 14.7 % (ref 11.5–15.5)
WBC: 9 10*3/uL (ref 4.0–10.5)
nRBC: 0 % (ref 0.0–0.2)

## 2022-01-01 LAB — BASIC METABOLIC PANEL
Anion gap: 6 (ref 5–15)
BUN: 25 mg/dL — ABNORMAL HIGH (ref 8–23)
CO2: 26 mmol/L (ref 22–32)
Calcium: 9.1 mg/dL (ref 8.9–10.3)
Chloride: 109 mmol/L (ref 98–111)
Creatinine, Ser: 0.89 mg/dL (ref 0.44–1.00)
GFR, Estimated: >60 mL/min (ref >60)
Glucose, Bld: 96 mg/dL (ref 70–99)
Potassium: 3.7 mmol/L (ref 3.5–5.1)
Sodium: 141 mmol/L (ref 135–145)

## 2022-01-01 MED ORDER — PANTOPRAZOLE SODIUM 40 MG IV SOLR
40.0000 mg | Freq: Two times a day (BID) | INTRAVENOUS | Status: DC
  Filled 2022-01-01: qty 10

## 2022-01-01 NOTE — Assessment & Plan Note (Addendum)
-   We will continue amlodipine and Zestril. 

## 2022-01-01 NOTE — Progress Notes (Signed)
Patient states " I am no longer bleeding, I want to go home." Explained to patient that she has been admitted for further monitoring of any future bleeding. Patient has refused the cardiac monitor and continuous fluids. Dr.Mansy has been updated. ?

## 2022-01-01 NOTE — Telephone Encounter (Signed)
I have reviewed the discharge summary. She needs follow-up with in the next week or so. Please get her scheduled.  ?

## 2022-01-01 NOTE — Assessment & Plan Note (Signed)
We will continue Klonopin  ?

## 2022-01-01 NOTE — Assessment & Plan Note (Signed)
-   We will continue PPI therapy 

## 2022-01-01 NOTE — Plan of Care (Signed)
?  Problem: Clinical Measurements: ?Goal: Ability to maintain clinical measurements within normal limits will improve ?Outcome: Progressing ?Goal: Respiratory complications will improve ?Outcome: Progressing ?  ?Problem: Activity: ?Goal: Risk for activity intolerance will decrease ?Outcome: Progressing ?  ?Problem: Coping: ?Goal: Level of anxiety will decrease ?Outcome: Progressing ?  ?Problem: Skin Integrity: ?Goal: Risk for impaired skin integrity will decrease ?Outcome: Progressing ?  ?

## 2022-01-01 NOTE — Telephone Encounter (Signed)
Pt called in stating that she went to [facility] yesterday after and they had admitted her... Pt stated that she had took a shower and when she got out the shower to dry off she notice that she had blood on her towel... Pt stated that when she decided to go to [facility]... Pt stated that everything is in her mychart for Dr. Caryl Bis to read...  ?

## 2022-01-01 NOTE — Discharge Summary (Signed)
Physician Discharge Summary  ?Toni Berger GGE:[id number] DOB: 16-Nov-1945 DOA: 12/31/2021 ? ?PCP: Leone Haven, MD ? ?Admit date: 12/31/2021 ?Discharge date: 01/01/2022 ? ?Admitted From: Home ?Disposition:  Home ? ?Recommendations for Outpatient Follow-up:  ?Follow up with PCP in 1-2 weeks ?Consider referral to gyn if bleeding is vaginal in nature ? ?Home Health:No ?Equipment/Devices:None  ? ?Discharge Condition:Stable ?CODE STATUS:FULL  ?Diet recommendation: Regular ? ?Brief/Interim Summary: ? 76 y.o. female with medical history significant for multiple medical problems that are mentioned below, presented to emergency room with acute onset of suspected vaginal bleeding though the patient is status post hysterectomy.  The patient took a shower and after coming down to and drying herself she noted blood soaking her towel.  She is on Plavix.  She denies any nausea or vomiting or abdominal pain.  She has been having dyspnea on exertion with with no cough or wheezing.  No fever or chills.  No dysuria, oliguria or hematuria or flank pain. ? ?Patient was initially admitted with the presumptive diagnosis of GI/diverticular bleed.  On my evaluation patient is resting comfortably.  Hemoglobin is relatively stable.  Minimal drop felt to be secondary to hemodilution in the setting of intravenous fluid resuscitation.  I had a lengthy discussion with patient regarding her presenting symptoms.  Patient is adamant that the bleed was vaginal in nature and has stopped.  I explained that diverticular bleed is never vaginal in nature.  I did offer consultation with inpatient gynecology which the patient declined.  Given her overall hemodynamic stability she will be discharged home.  Patient has a significant CVA history so will not hold Plavix.  I gave the patient very clear instructions that she needs to follow-up with her primary care physician within 1 week of discharge for further discussion regarding this bleeding event.  I  explained that vaginal bleeding in a postmenopausal female is never normal and it needs further investigation.  Patient expressed full understanding my instructions.  I also gave clear return to ED instructions should bleeding become very significant.  She expressed understanding.  She will be discharged home in stable condition. ? ? ? ? ?Discharge Diagnoses:  ?Principal Problem: ?  GI bleeding ?Active Problems: ?  Anxiety ?  HYPERTENSION, BENIGN ?  GERD (gastroesophageal reflux disease) ? ?Acute bleeding event, vaginal versus GI ?Unclear nature.  Vaginal versus GI.  Hemodynamically stable.  Hemoglobin stable at time of discharge.  Patient declines any consultations or further evaluation at this time.  States her bleeding is stopped.  She will be discharged home.  She takes twice daily PPI which I will continue.  We will also continue Plavix.  Clear follow-up and return to ED instructions have been provided.  Hemoglobin stable and no bleeding at time of discharge ? ?Discharge Instructions ? ?Discharge Instructions   ? ? Diet - low sodium heart healthy   Complete by: As directed ?  ? Increase activity slowly   Complete by: As directed ?  ? ?  ? ?Allergies as of 01/01/2022   ? ?   Reactions  ? Carbidopa-levodopa Other (See Comments)  ? REACTION: headache, stomach pain, chest pain and seeing purple colors  ? Prednisone   ? Throat swelling  ? Sulfonamide Derivatives   ? "stop breathing, eyes roll in back of head and fingers web"  ? Aquasonic 100 [ultrasound Gel] Itching  ? Ciprofloxacin   ? Oxycodone Diarrhea  ? Potassium Diarrhea  ? Povidone-iodine   ? Propoxyphene Hcl   ?  Sertraline Hcl   ? headache  ? Tetracycline   ? Acetaminophen Diarrhea  ? Stomach pain  ? Metoprolol Other (See Comments)  ? To low heart rate/ BP  ? Pravastatin Other (See Comments)  ? Muscle cramps  ? ?  ? ?  ?Medication List  ?  ? ?TAKE these medications   ? ?amLODipine 5 MG tablet ?Commonly known as: NORVASC ?Take 1 tablet (5 mg total) by mouth  daily. ?  ?clonazePAM 1 MG tablet ?Commonly known as: KLONOPIN ?TAKE 1 TABLET BY MOUTH EVERY 8 TO 12 HOURS AS NEEDED. MAXIMUM 3 DOSES IN 24 HOURS ?  ?clopidogrel 75 MG tablet ?Commonly known as: PLAVIX ?Take 1 tablet (75 mg total) by mouth daily. ?  ?cyanocobalamin 1000 MCG tablet ?Take 1,000 mcg by mouth every evening. ?  ?lisinopril 40 MG tablet ?Commonly known as: ZESTRIL ?Take 1 tablet (40 mg total) by mouth daily. ?  ?pantoprazole 40 MG tablet ?Commonly known as: Protonix ?Take 1 tablet (40 mg total) by mouth 2 (two) times daily before a meal. ?  ?traMADol 50 MG tablet ?Commonly known as: ULTRAM ?TAKE 1 TABLET BY MOUTH EVERY 8 HOURS AS NEEDED FOR SEVERE PAIN ?  ? ?  ? ? ?Allergies  ?Allergen Reactions  ? Carbidopa-Levodopa Other (See Comments)  ?  REACTION: headache, stomach pain, chest pain and seeing purple colors  ? Prednisone   ?  Throat swelling ?  ? Sulfonamide Derivatives   ?  "stop breathing, eyes roll in back of head and fingers web"  ? Aquasonic 100 [Ultrasound Gel] Itching  ? Ciprofloxacin   ? Oxycodone Diarrhea  ? Potassium Diarrhea  ? Povidone-Iodine   ? Propoxyphene Hcl   ? Sertraline Hcl   ?  headache  ? Tetracycline   ? Acetaminophen Diarrhea  ?  Stomach pain  ? Metoprolol Other (See Comments)  ?  To low heart rate/ BP  ? Pravastatin Other (See Comments)  ?  Muscle cramps  ? ? ?Consultations: ?None ? ? ?Procedures/Studies: ?No results found. ? ? ? ?Subjective: ?Seen and examined on day of discharge. stable no distress.  Offered consultation with GI or oncology, patient declined.  Stable for discharge ? ?Discharge Exam: ?Vitals:  ? 01/01/22 0305 01/01/22 0829  ?BP: (!) 166/82 (!) 164/70  ?Pulse: 71 68  ?Resp: 17   ?Temp: 98.1 ?F (36.7 ?C) 98 ?F (36.7 ?C)  ?SpO2: 98% 100%  ? ?Vitals:  ? 01/01/22 0100 01/01/22 0200 01/01/22 0305 01/01/22 0829  ?BP: (!) 153/73 137/72 (!) 166/82 (!) 164/70  ?Pulse: 63 73 71 68  ?Resp:  19 17   ?Temp:   98.1 ?F (36.7 ?C) 98 ?F (36.7 ?C)  ?TempSrc:   Oral Oral  ?SpO2:  99% 96% 98% 100%  ?Weight:      ?Height:      ? ? ?General: Pt is alert, awake, not in acute distress ?Cardiovascular: RRR, S1/S2 +, no rubs, no gallops ?Respiratory: CTA bilaterally, no wheezing, no rhonchi ?Abdominal: Soft, NT, ND, bowel sounds + ?Extremities: no edema, no cyanosis ? ? ? ?The results of significant diagnostics from this hospitalization (including imaging, microbiology, ancillary and laboratory) are listed below for reference.   ? ? ?Microbiology: ?Recent Results (from the past 240 hour(s))  ?Wet prep, genital     Status: None  ? Collection Time: 12/31/21  8:43 PM  ?Result Value Ref Range Status  ? Yeast Wet Prep HPF POC NONE SEEN NONE SEEN Final  ? Trich, Wet Prep  NONE SEEN NONE SEEN Final  ? Clue Cells Wet Prep HPF POC NONE SEEN NONE SEEN Final  ? WBC, Wet Prep HPF POC <10 <10 Final  ? Sperm NONE SEEN  Final  ?  Comment: Performed at [facility], [address], [place], [place] [address]  ?  ? ?Labs: ?BNP (last 3 results) ?No results for input(s): BNP in the last 8760 hours. ?Basic Metabolic Panel: ?Recent Labs  ?Lab 12/31/21 ?2040 01/01/22 ?0410  ?NA 139 141  ?K 4.0 3.7  ?CL 105 109  ?CO2 23 26  ?GLUCOSE 115* 96  ?BUN 29* 25*  ?CREATININE 1.02* 0.89  ?CALCIUM 9.3 9.1  ? ?Liver Function Tests: ?No results for input(s): AST, ALT, ALKPHOS, BILITOT, PROT, ALBUMIN in the last 168 hours. ?No results for input(s): LIPASE, AMYLASE in the last 168 hours. ?No results for input(s): AMMONIA in the last 168 hours. ?CBC: ?Recent Labs  ?Lab 12/31/21 ?2040 01/01/22 ?0410  ?WBC 8.0 9.0  ?NEUTROABS 5.7  --   ?HGB 12.0 11.4*  ?HCT 37.6 35.5*  ?MCV 84.1 83.9  ?PLT 370 342  ? ?Cardiac Enzymes: ?No results for input(s): CKTOTAL, CKMB, CKMBINDEX, TROPONINI in the last 168 hours. ?BNP: ?Invalid input(s): POCBNP ?CBG: ?No results for input(s): GLUCAP in the last 168 hours. ?D-Dimer ?No results for input(s): DDIMER in the last 72 hours. ?Hgb A1c ?No results for input(s): HGBA1C in the last 72 hours. ?Lipid  Profile ?No results for input(s): CHOL, HDL, LDLCALC, TRIG, CHOLHDL, LDLDIRECT in the last 72 hours. ?Thyroid function studies ?No results for input(s): TSH, T4TOTAL, T3FREE, THYROIDAB in the last 72 hours.

## 2022-01-01 NOTE — Telephone Encounter (Signed)
Patient stated she is scheduled for May 8 and she would rather make that her hospital follow  up appointment.  Jkai Arwood,cma  ?

## 2022-01-01 NOTE — TOC Initial Note (Signed)
Transition of Care (TOC) - Initial/Assessment Note  ? ? ?Patient Details  ?Name: Toni Berger ?MRN: [id number] ?Date of Birth: 1946/01/29 ? ?Transition of Care (TOC) CM/SW Contact:    ?Laurena Slimmer, RN ?Phone Number: ?01/01/2022, 10:52 AM ? ?Clinical Narrative:                 ? ? ?Transition of Care (TOC) Screening Note ? ? ?Patient Details  ?Name: Toni Berger ?Date of Birth: [birth year] ? ? ?Transition of Care (TOC) CM/SW Contact:    ?Laurena Slimmer, RN ?Phone Number: ?01/01/2022, 10:53 AM ? ? ? ?Transition of Care Department [facility]) has reviewed patient and no TOC needs have been identified at this time. We will continue to monitor patient advancement through interdisciplinary progression rounds. If new patient transition needs arise, please place a TOC consult. ? ? ? ?  ?  ? ? ?Patient Goals and CMS Choice ?  ?  ?  ? ?Expected Discharge Plan and Services ?  ?  ?  ?  ?  ?Expected Discharge Date: 01/01/22               ?  ?  ?  ?  ?  ?  ?  ?  ?  ?  ? ?Prior Living Arrangements/Services ?  ?  ?  ?       ?  ?  ?  ?  ? ?Activities of Daily Living ?Home Assistive Devices/Equipment: Eyeglasses, Kasandra Knudsen (specify quad or straight) (straight) ?ADL Screening (condition at time of admission) ?Patient's cognitive ability adequate to safely complete daily activities?: Yes ?Is the patient deaf or have difficulty hearing?: No ?Does the patient have difficulty seeing, even when wearing glasses/contacts?: No ?Does the patient have difficulty concentrating, remembering, or making decisions?: No ?Patient able to express need for assistance with ADLs?: Yes ?Does the patient have difficulty dressing or bathing?: Yes ?Independently performs ADLs?: No ?Communication: Independent ?Dressing (OT): Needs assistance ?Is this a change from baseline?: Change from baseline, expected to last <3days ?Grooming: Independent ?Feeding: Independent ?Bathing: Needs assistance ?Is this a change from baseline?: Change from baseline, expected to last <3  days ?Toileting: Needs assistance ?Is this a change from baseline?: Change from baseline, expected to last <3 days ?In/Out Bed: Needs assistance ?Is this a change from baseline?: Change from baseline, expected to last <3 days ?Walks in Home: Independent with device (comment), Dependent (straight cane) ?Is this a change from baseline?: Pre-admission baseline ?Does the patient have difficulty walking or climbing stairs?: No ?Weakness of Legs: Both ?Weakness of Arms/Hands: None ? ?Permission Sought/Granted ?  ?  ?   ?   ?   ?   ? ?Emotional Assessment ?  ?  ?  ?  ?  ?  ? ?Admission diagnosis:  GI bleeding [K92.2] ?Bright red rectal bleeding [K62.5] ?Patient Active Problem List  ? Diagnosis Date Noted  ? GI bleeding 12/31/2021  ? Adnexal cyst 11/28/2021  ? Hypokalemia 11/28/2021  ? Acute colitis 11/11/2021  ? HTN (hypertension) 11/11/2021  ? Chest pain 10/21/2021  ? Bilateral leg pain 05/12/2021  ? History of anemia 02/06/2021  ? Right shoulder pain 04/22/2020  ? Abnormal mammogram 02/02/2020  ? Prediabetes 01/19/2020  ? GERD (gastroesophageal reflux disease) 01/19/2020  ? B12 deficiency 09/14/2019  ? Neuropathy 06/12/2019  ? Unsteadiness on feet 06/12/2019  ? Trochanteric bursitis of left hip 03/24/2019  ? Right low back pain 10/24/2018  ? Rib pain on left side 08/11/2018  ?  Adrenal adenoma, left 04/23/2018  ? Hiatal hernia 04/23/2018  ? Aortic atherosclerosis ([place]) 04/23/2018  ? Anxiety and depression 04/29/2016  ? Venous insufficiency of both lower extremities 03/23/2016  ? Hyperlipidemia 01/09/2016  ? 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 Rotator cuff impingement syndrome 12/15/2015  ? Cerebrovascular accident, old 10/29/2015  ? HYPERTENSION, BENIGN 11/12/2010  ? Anxiety 02/09/2008  ? RESTLESS LEG SYNDROME 02/09/2008  ? ALLERGIC RHINITIS 02/09/2008  ? ?PCP:  Leone Haven, MD ?Pharmacy:   ?[id number] [address], [address] ?[place] ?[place] [place] [address] ?Phone: [phone] Fax: [phone] ? ? ? ? ?Social Determinants of Health (SDOH) Interventions ?  ? ?Readmission Risk Interventions ?   ? View : No data to display.  ?  ?  ?  ? ? ? ?

## 2022-01-01 NOTE — Assessment & Plan Note (Signed)
-   The patient will be admitted to an observation medical telemetry bed. ?- We will follow serial hemoglobins and hematocrits. ?- GI consult will be obtained. ?- I notified Dr. Alice Reichert about the patient. ?

## 2022-01-06 ENCOUNTER — Ambulatory Visit (INDEPENDENT_AMBULATORY_CARE_PROVIDER_SITE_OTHER): Payer: Medicare Other

## 2022-01-06 ENCOUNTER — Encounter: Payer: Self-pay | Admitting: Family Medicine

## 2022-01-06 VITALS — Ht 67.0 in | Wt 206.0 lb

## 2022-01-06 DIAGNOSIS — Z0000 Encounter for general adult medical examination without abnormal findings: Secondary | ICD-10-CM | POA: Diagnosis not present

## 2022-01-06 NOTE — Telephone Encounter (Signed)
I called and informed the patient that if she sees anymore blood or diarrhea she needs to be seen before that appointment and she understood.  Wilburt Messina,cma  ?

## 2022-01-06 NOTE — Telephone Encounter (Signed)
TCM only qualifies 7-14 days after discharge. Follow up appointment scheduled will not qualify.  ?

## 2022-01-06 NOTE — Progress Notes (Addendum)
Subjective:   Toni Berger is a 76 y.o. female who presents for an Initial Medicare Annual Wellness Visit.  Review of Systems    No ROS.  Medicare Wellness Virtual Visit.  Visual/audio telehealth visit, UTA vital signs.   See social history for additional risk factors.   Cardiac Risk Factors include: advanced age (>22men, >62 women);hypertension     Objective:    Today's Vitals   01/06/22 1047  Weight: 206 lb (93.4 kg)  Height: 5\' 7"  (1.702 m)   Body mass index is 32.26 kg/m.     01/06/2022   10:56 AM 12/31/2021    8:02 PM 11/11/2021    6:00 PM 11/11/2021   12:24 PM 03/30/2019   10:11 AM 03/28/2019    4:00 AM 03/27/2019   11:11 PM  Advanced Directives  Does Patient Have a Medical Advance Directive? Yes Yes Yes No Yes Yes Yes  Type of Estate agent of [place];Living will Healthcare Power of [employer] Power of [employer] Power of [employer] Power of [employer] Power of [place];Out of facility DNR (pink MOST or yellow form)  Does patient want to make changes to medical advance directive? No - Patient declined No - Patient declined No - Patient declined   No - Patient declined   Copy of Healthcare Power of Attorney in Chart? Yes - validated most recent copy scanned in chart (See row information) Yes - validated most recent copy scanned in chart (See row information)   Yes - validated most recent copy scanned in chart (See row information) Yes - validated most recent copy scanned in chart (See row information) Yes - validated most recent copy scanned in chart (See row information)  Would patient like information on creating a medical advance directive?   No - Patient declined No - Patient declined     Pre-existing out of facility DNR order (yellow form or pink MOST form)      Yellow form placed in chart (order not valid for inpatient use) Yellow form placed in chart (order not valid for inpatient use)    Current Medications  (verified) Outpatient Encounter Medications as of 01/06/2022  Medication Sig   amLODipine (NORVASC) 5 MG tablet Take 1 tablet (5 mg total) by mouth daily.   clonazePAM (KLONOPIN) 1 MG tablet TAKE 1 TABLET BY MOUTH EVERY 8 TO 12 HOURS AS NEEDED. MAXIMUM 3 DOSES IN 24 HOURS   clopidogrel (PLAVIX) 75 MG tablet Take 1 tablet (75 mg total) by mouth daily.   cyanocobalamin 1000 MCG tablet Take 1,000 mcg by mouth every evening.   lisinopril (ZESTRIL) 40 MG tablet Take 1 tablet (40 mg total) by mouth daily.   pantoprazole (PROTONIX) 40 MG tablet Take 1 tablet (40 mg total) by mouth 2 (two) times daily before a meal.   traMADol (ULTRAM) 50 MG tablet TAKE 1 TABLET BY MOUTH EVERY 8 HOURS AS NEEDED FOR SEVERE PAIN   No facility-administered encounter medications on file as of 01/06/2022.    Allergies (verified) Carbidopa-levodopa, Prednisone, Sulfonamide derivatives, Aquasonic 100 [ultrasound gel], Ciprofloxacin, Oxycodone, Potassium, Povidone-iodine, Propoxyphene hcl, Sertraline hcl, Tetracycline, Acetaminophen, Metoprolol, and Pravastatin   History: Past Medical History:  Diagnosis Date   Allergy    Anxiety    Arthritis    Closed rib fracture 01/09/2016   Fibrocystic breast    Hyperlipidemia    Hypertension    Lipoma    5/01 Abdominal wall lipoma   Loss of consciousness (HCC) 12/15/2015  Migraines    Osteoporosis, unspecified    Restless leg syndrome    Stroke (cerebrum) (HCC)    Upper GI bleed 03/27/2019   Venous stasis dermatitis 01/19/2020   Past Surgical History:  Procedure Laterality Date   ABDOMINAL HYSTERECTOMY     APPENDECTOMY  1999   BREAST CYST ASPIRATION     CHOLECYSTECTOMY  1999   DOBUTAMINE STRESS ECHO  06/96   negative   ESOPHAGOGASTRODUODENOSCOPY N/A 03/30/2019   Procedure: ESOPHAGOGASTRODUODENOSCOPY (EGD);  Surgeon: Toney Reil, MD;  Location: [facility] ENDOSCOPY;  Service: Gastroenterology;  Laterality: N/A;   GIVENS CAPSULE STUDY N/A 03/31/2019   Procedure: GIVENS  CAPSULE STUDY;  Surgeon: Toney Reil, MD;  Location: [facility] ENDOSCOPY;  Service: Gastroenterology;  Laterality: N/A;   TONSILLECTOMY AND ADENOIDECTOMY  1974   TOTAL ABDOMINAL HYSTERECTOMY W/ BILATERAL SALPINGOOPHORECTOMY  1981   Family History  Problem Relation Age of Onset   Heart disease Mother    Diabetes Mother    Aneurysm Mother        AAA   Heart failure Mother    Heart failure Father    Coronary artery disease Father    Colon cancer Neg Hx    Breast cancer Neg Hx    Social History   Socioeconomic History   Marital status: Single    Spouse name: Not on file   Number of children: Not on file   Years of education: Not on file   Highest education level: Not on file  Occupational History   Occupation: retired Airline pilot for town of [employer]    Employer: RETIRED  Tobacco Use   Smoking status: Former   Smokeless tobacco: Never  Substance and Sexual Activity   Alcohol use: No   Drug use: No   Sexual activity: Not on file  Other Topics Concern   Not on file  Social History Narrative   Has Health Care POA-- husband and then stepson.   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   Social Determinants of Health   Financial Resource Strain: Low Risk    Difficulty of Paying Living Expenses: Not hard at all  Food Insecurity: No Food Insecurity   Worried About Programme researcher, broadcasting/film/video in the Last Year: Never true   Ran Out of Food in the Last Year: Never true  Transportation Needs: No Transportation Needs   Lack of Transportation (Medical): No   Lack of Transportation (Non-Medical): No  Physical Activity: Unknown   Days of Exercise per Week: Patient refused   Minutes of Exercise per Session: Not on file  Stress: No Stress Concern Present   Feeling of Stress : Only a little  Social Connections: Unknown   Frequency of Communication with Friends and Family: Patient refused   Frequency of Social Gatherings with Friends and Family:  Patient refused   Attends Religious Services: Patient refused   Active Member of Clubs or Organizations: Patient refused   Attends Banker Meetings: Not on file   Marital Status: Widowed    Tobacco Counseling Counseling given: Not Answered   Clinical Intake:  Pre-visit preparation completed: Yes           How often do you need to have someone help you when you read instructions, pamphlets, or other written materials from your doctor or pharmacy?: 1 - Never   Interpreter Needed?: No      Activities of Daily Living    01/06/2022   10:58 AM 01/01/2022  3:00 AM  In your present state of health, do you have any difficulty performing the following activities:  Hearing? 0 0  Vision? 0 0  Difficulty concentrating or making decisions? 0 0  Walking or climbing stairs? 1 0  Comment Cane in use when ambulating   Dressing or bathing? 0 1  Doing errands, shopping? 0 1  Preparing Food and eating ? N   Using the Toilet? N   In the past six months, have you accidently leaked urine? N   Do you have problems with loss of bowel control? N   Managing your Medications? N   Managing your Finances? N   Housekeeping or managing your Housekeeping? N     Patient Care Team: Glori Luis, MD as PCP - General (Family Medicine) Stanton Kidney, MD as Consulting Physician (Gastroenterology)  Indicate any recent Medical Services you may have received from other than [facility] providers in the past year (date may be approximate).     Assessment:   This is a routine wellness examination for [place].  Virtual Visit via Telephone Note  I connected with  Barbara Cower on 01/07/22 at 10:45 AM EDT by telephone and verified that I am speaking with the correct person using two identifiers.  Persons participating in the virtual visit: patient/Nurse Health Advisor   I discussed the limitations of performing an evaluation and management service by telehealth. The patient  expressed understanding and agreed to proceed. We continued and completed visit with audio only. Some vital signs may be absent or patient reported.   Hearing/Vision screen Hearing Screening - Comments:: Patient is able to hear conversational tones without difficulty.  No issues reported. Vision Screening - Comments:: Wears corrective lenses    Dietary issues and exercise activities discussed: Current Exercise Habits: Home exercise routine, Intensity: Mild Healthy diet Good water intake   Goals Addressed             This Visit's Progress    Maintain Healthy Lifestyle       Stay active Healthy diet       Depression Screen    01/06/2022   10:58 AM 10/21/2021    9:17 AM 05/12/2021    9:16 AM 02/06/2021    8:58 AM 11/06/2020    2:42 PM 04/22/2020    1:14 PM 01/19/2020    3:03 PM  PHQ 2/9 Scores  PHQ - 2 Score 0 0 0 0 0 5 0  PHQ- 9 Score      11     Fall Risk    01/06/2022   10:57 AM 10/21/2021    9:16 AM 05/12/2021    9:16 AM 02/06/2021    8:58 AM 11/06/2020    2:41 PM  Fall Risk   Falls in the past year? 0 0 0 0 0  Number falls in past yr: 0 0 0 0 0  Injury with Fall?  0 0 0   Risk for fall due to : Impaired balance/gait No Fall Risks     Follow up Falls evaluation completed Falls evaluation completed Falls evaluation completed Falls evaluation completed Falls evaluation completed    FALL RISK PREVENTION PERTAINING TO THE HOME: Home free of loose throw rugs in walkways, pet beds, electrical cords, etc? Yes  Adequate lighting in your home to reduce risk of falls? Yes   ASSISTIVE DEVICES UTILIZED TO PREVENT FALLS: Life alert? No  Use of a cane, walker or w/c? Yes, cane ADT security system? Yes  TIMED  UP AND GO: Was the test performed? No .   Cognitive Function:  Patient is alert and oriented x3.       Immunizations Immunization History  Administered Date(s) Administered   Td 12/15/2006   TDAP status: Due, Education has been provided regarding the importance  of this vaccine. Advised may receive this vaccine at local pharmacy or Health Dept. Aware to provide a copy of the vaccination record if obtained from local pharmacy or Health Dept. Verbalized acceptance and understanding.Deferred per patient.   Pneumococcal vaccine status: Declined,  Education has been provided regarding the importance of this vaccine but patient still declined. Advised may receive this vaccine at local pharmacy or Health Dept. Aware to provide a copy of the vaccination record if obtained from local pharmacy or Health Dept. Verbalized acceptance and understanding.  Discontinue per patient.   Covid-19 vaccine status: Declined, Education has been provided regarding the importance of this vaccine but patient still declined. Advised may receive this vaccine at local pharmacy or Health Dept.or vaccine clinic. Aware to provide a copy of the vaccination record if obtained from local pharmacy or Health Dept. Verbalized acceptance and understanding.  Shingrix Completed?: No.    Education has been provided regarding the importance of this vaccine. Patient has been advised to call insurance company to determine out of pocket expense if they have not yet received this vaccine. Advised may also receive vaccine at local pharmacy or Health Dept. Verbalized acceptance and understanding.Discontinue per patient.   Screening Tests Health Maintenance  Topic Date Due   TETANUS/TDAP  01/07/2023 (Originally 12/14/2016)   INFLUENZA VACCINE  04/14/2022   DEXA SCAN  Completed   HPV VACCINES  Aged Out   Pneumonia Vaccine 36+ Years old  Discontinued   COVID-19 Vaccine  Discontinued   Hepatitis C Screening  Discontinued   Zoster Vaccines- Shingrix  Discontinued   Health Maintenance There are no preventive care reminders to display for this patient.  Lung Cancer Screening: (Low Dose CT Chest recommended if Age 61-80 years, 30 pack-year currently smoking OR have quit w/in 15years.) does not qualify.    Hepatitis C Screening:   Vision Screening: Recommended annual ophthalmology exams for early detection of glaucoma and other disorders of the eye.  Dental Screening: Recommended annual dental exams for proper oral hygiene  Community Resource Referral / Chronic Care Management: CRR required this visit?  No   CCM required this visit?  No      Plan:   Keep all routine maintenance appointments.   I have personally reviewed and noted the following in the patient's chart:   Medical and social history Use of alcohol, tobacco or illicit drugs  Current medications and supplements including opioid prescriptions. Patient is currently taking opioid prescriptions. Information provided to patient regarding non-opioid alternatives. Patient advised to discuss non-opioid treatment plan with their provider.Taking Ultram. Followed by PCP.  Functional ability and status Nutritional status Physical activity Advanced directives List of other physicians Hospitalizations, surgeries, and ER visits in previous 12 months- recovering well from most recent discharge. Denies bleeding. Scheduled HFU 01/19/22.  Vitals Screenings to include cognitive, depression, and falls Referrals and appointments  In addition, I have reviewed and discussed with patient certain preventive protocols, quality metrics, and best practice recommendations. A written personalized care plan for preventive services as well as general preventive health recommendations were provided to patient.     [name], MD   01/07/2022

## 2022-01-06 NOTE — Patient Instructions (Addendum)
?  Ms. Cousineau , ?Thank you for taking time to come for your Medicare Wellness Visit. I appreciate your ongoing commitment to your health goals. Please review the following plan we discussed and let me know if I can assist you in the future.  ? ?These are the goals we discussed: ? Goals   ? ?  Maintain Healthy Lifestyle   ?  Stay active ?Healthy diet ?  ? ?  ?  ?This is a list of the screening recommended for you and due dates:  ?Health Maintenance  ?Topic Date Due  ? Tetanus Vaccine  01/07/2023*  ? Flu Shot  04/14/2022  ? DEXA scan (bone density measurement)  Completed  ? HPV Vaccine  Aged Out  ? Pneumonia Vaccine  Discontinued  ? COVID-19 Vaccine  Discontinued  ? Hepatitis C Screening: USPSTF Recommendation to screen - Ages 46-79 yo.  Discontinued  ? Zoster (Shingles) Vaccine  Discontinued  ?*Topic was postponed. The date shown is not the original due date.  ?  ?

## 2022-01-07 ENCOUNTER — Encounter: Payer: Self-pay | Admitting: Family Medicine

## 2022-01-19 ENCOUNTER — Ambulatory Visit (INDEPENDENT_AMBULATORY_CARE_PROVIDER_SITE_OTHER): Payer: Medicare Other | Admitting: Family Medicine

## 2022-01-19 ENCOUNTER — Encounter: Payer: Self-pay | Admitting: Family Medicine

## 2022-01-19 DIAGNOSIS — I10 Essential (primary) hypertension: Secondary | ICD-10-CM

## 2022-01-19 DIAGNOSIS — K922 Gastrointestinal hemorrhage, unspecified: Secondary | ICD-10-CM

## 2022-01-19 DIAGNOSIS — Z20822 Contact with and (suspected) exposure to covid-19: Secondary | ICD-10-CM | POA: Diagnosis not present

## 2022-01-19 DIAGNOSIS — F419 Anxiety disorder, unspecified: Secondary | ICD-10-CM | POA: Diagnosis not present

## 2022-01-19 LAB — CBC
HCT: 35.9 % — ABNORMAL LOW (ref 36.0–46.0)
Hemoglobin: 11.8 g/dL — ABNORMAL LOW (ref 12.0–15.0)
MCHC: 32.9 g/dL (ref 30.0–36.0)
MCV: 83.1 fl (ref 78.0–100.0)
Platelets: 332 10*3/uL (ref 150.0–400.0)
RBC: 4.31 Mil/uL (ref 3.87–5.11)
RDW: 14.8 % (ref 11.5–15.5)
WBC: 9.8 10*3/uL (ref 4.0–10.5)

## 2022-01-19 MED ORDER — CLONAZEPAM 1 MG PO TABS: ORAL_TABLET | ORAL | 0 refills | Status: DC

## 2022-01-19 MED ORDER — TRAMADOL HCL 50 MG PO TABS: ORAL_TABLET | ORAL | 0 refills | Status: DC

## 2022-01-19 MED ORDER — PANTOPRAZOLE SODIUM 40 MG PO TBEC: 40.0000 mg | DELAYED_RELEASE_TABLET | Freq: Two times a day (BID) | ORAL | 0 refills | Status: DC

## 2022-01-19 NOTE — Progress Notes (Signed)
?[name], MD ?Phone: [phone] ? ?Toni Berger is a 76 y.o. female who presents today for f/u. ? ?HYPERTENSION ?Disease Monitoring ?Home BP Monitoring not checking Chest pain- no    Dyspnea- no ?Medications ?Compliance-  taking amlodipine, lisinopril  Edema- no ?Notes her BP is up because her fridge went out this morning. ?BMET ?   ?Component Value Date/Time  ? NA 141 01/01/2022 0410  ? K 3.7 01/01/2022 0410  ? CL 109 01/01/2022 0410  ? CO2 26 01/01/2022 0410  ? GLUCOSE 96 01/01/2022 0410  ? BUN 25 (H) 01/01/2022 0410  ? CREATININE 0.89 01/01/2022 0410  ? CREATININE 0.84 01/19/2020 1522  ? CALCIUM 9.1 01/01/2022 0410  ? GFRNONAA >60 01/01/2022 0410  ? GFRAA >60 04/02/2019 0437  ? ?Anxiety: Patient notes this is stable.  She typically takes the clonazepam more at night given that she gets fidgety.  No depression. ? ?Bright red blood per rectum: Patient reports an episode of this while at home.  It seemed that there was some confusion as to whether or not this was vaginal bleeding versus rectal bleeding.  She notes she is status post hysterectomy.  She had a pelvic exam in the emergency department that did not reveal any vaginal bleeding.  She notes no recurrence.  She now feels as though this was rectal bleeding possibly from diverticuli. ? ? ?Social History  ? ?Tobacco Use  ?Smoking Status Former  ?Smokeless Tobacco Never  ? ? ?Current Outpatient Medications on File Prior to Visit  ?Medication Sig Dispense Refill  ? amLODipine (NORVASC) 5 MG tablet Take 1 tablet (5 mg total) by mouth daily. 90 tablet 1  ? clopidogrel (PLAVIX) 75 MG tablet Take 1 tablet (75 mg total) by mouth daily. 90 tablet 3  ? cyanocobalamin 1000 MCG tablet Take 1,000 mcg by mouth every evening.    ? lisinopril (ZESTRIL) 40 MG tablet Take 1 tablet (40 mg total) by mouth daily. 90 tablet 3  ? ?No current facility-administered medications on file prior to visit.  ? ? ? ?ROS see history of present illness ? ?Objective ? ?Physical  Exam ?Vitals:  ? 01/19/22 1048 01/19/22 1146  ?BP: (!) 150/80 120/80  ?Pulse: 92   ?Temp: (!) 96.3 ?F (35.7 ?C)   ?SpO2: 97%   ? ? ?BP Readings from Last 3 Encounters:  ?01/19/22 120/80  ?01/01/22 (!) 164/70  ?11/17/21 (!) 142/67  ? ?Wt Readings from Last 3 Encounters:  ?01/19/22 200 lb 3.2 oz (90.8 kg)  ?01/06/22 206 lb (93.4 kg)  ?12/31/21 206 lb 5.6 oz (93.6 kg)  ? ? ?Physical Exam ?Constitutional:   ?   General: She is not in acute distress. ?   Appearance: She is not diaphoretic.  ?Cardiovascular:  ?   Rate and Rhythm: Normal rate and regular rhythm.  ?   Heart sounds: Normal heart sounds.  ?Pulmonary:  ?   Effort: Pulmonary effort is normal.  ?   Breath sounds: Normal breath sounds.  ?Abdominal:  ?   General: Bowel sounds are normal. There is no distension.  ?   Palpations: Abdomen is soft.  ?   Tenderness: There is no abdominal tenderness.  ?Skin: ?   General: Skin is warm and dry.  ?Neurological:  ?   Mental Status: She is alert.  ? ? ? ?Assessment/Plan: Please see individual problem list. ? ?Problem List Items Addressed This Visit   ? ? Anxiety (Chronic)  ?  Chronic issue.  Stable.  She can  continue clonazepam 1 mg every 8-12 hours as needed. ? ?  ?  ? HYPERTENSION, BENIGN (Chronic)  ?  Elevated today though she has had quite a bit of stress.  She will continue amlodipine 5 mg once daily and lisinopril 40 mg daily. ? ?  ?  ? GI bleeding  ?  Suspect diverticular bleed.  She declines colonoscopy to evaluate further given inability to have transport to and from the colonoscopy.  At this time she will monitor for recurrence and let us know if this recurs.  We will check a CBC to evaluate her hemoglobin. ? ?  ?  ? Relevant Orders  ? CBC  ? ? ?Return in about 3 months (around 04/21/2022). ? ? ?[name], MD ?[place] ? ?

## 2022-01-19 NOTE — Assessment & Plan Note (Addendum)
Suspect diverticular bleed.  She declines colonoscopy to evaluate further given inability to have transport to and from the colonoscopy.  At this time she will monitor for recurrence and let us know if this recurs.  We will check a CBC to evaluate her hemoglobin. ?

## 2022-01-19 NOTE — Assessment & Plan Note (Signed)
Elevated today though she has had quite a bit of stress.  She will continue amlodipine 5 mg once daily and lisinopril 40 mg daily. ?

## 2022-01-19 NOTE — Assessment & Plan Note (Signed)
Chronic issue.  Stable.  She can continue clonazepam 1 mg every 8-12 hours as needed. ?

## 2022-01-19 NOTE — Patient Instructions (Signed)
Nice to see you. ?If you have any recurrent bleeding please let us know. ?We will contact you with your lab results. ?

## 2022-01-22 ENCOUNTER — Other Ambulatory Visit: Payer: Self-pay | Admitting: Family Medicine

## 2022-02-02 ENCOUNTER — Ambulatory Visit: Payer: Medicare Other | Admitting: Podiatry

## 2022-02-18 ENCOUNTER — Other Ambulatory Visit: Payer: Self-pay | Admitting: Family Medicine

## 2022-02-20 NOTE — Telephone Encounter (Signed)
Pharmacy called about refill for pt on tramadol and clonazepam

## 2022-03-18 ENCOUNTER — Other Ambulatory Visit: Payer: Self-pay | Admitting: Family Medicine

## 2022-03-26 ENCOUNTER — Ambulatory Visit: Payer: Medicare Other | Admitting: Podiatry

## 2022-04-13 ENCOUNTER — Ambulatory Visit: Payer: Medicare Other | Admitting: Family Medicine

## 2022-04-16 ENCOUNTER — Other Ambulatory Visit: Payer: Self-pay | Admitting: Family Medicine

## 2022-04-22 ENCOUNTER — Ambulatory Visit: Payer: Medicare Other | Admitting: Family Medicine

## 2022-04-24 ENCOUNTER — Encounter: Payer: Self-pay | Admitting: Family Medicine

## 2022-04-24 ENCOUNTER — Ambulatory Visit (INDEPENDENT_AMBULATORY_CARE_PROVIDER_SITE_OTHER): Payer: Medicare Other | Admitting: Family Medicine

## 2022-04-24 VITALS — BP 120/80 | HR 78 | Temp 98.6°F | Ht 67.0 in | Wt 201.0 lb

## 2022-04-24 DIAGNOSIS — I872 Venous insufficiency (chronic) (peripheral): Secondary | ICD-10-CM | POA: Diagnosis not present

## 2022-04-24 DIAGNOSIS — M79605 Pain in left leg: Secondary | ICD-10-CM | POA: Diagnosis not present

## 2022-04-24 DIAGNOSIS — I10 Essential (primary) hypertension: Secondary | ICD-10-CM

## 2022-04-24 DIAGNOSIS — K219 Gastro-esophageal reflux disease without esophagitis: Secondary | ICD-10-CM | POA: Diagnosis not present

## 2022-04-24 DIAGNOSIS — E7849 Other hyperlipidemia: Secondary | ICD-10-CM | POA: Diagnosis not present

## 2022-04-24 DIAGNOSIS — F419 Anxiety disorder, unspecified: Secondary | ICD-10-CM

## 2022-04-24 DIAGNOSIS — M79604 Pain in right leg: Secondary | ICD-10-CM

## 2022-04-24 NOTE — Assessment & Plan Note (Signed)
Generally stable.  She can continue clonazepam 1 mg every 8-12 hours as needed.

## 2022-04-24 NOTE — Assessment & Plan Note (Signed)
Patient with stasis dermatitis changes in her legs.  Discussed trial of triamcinolone though she declines at this time.  She will let me know if she changes her mind.

## 2022-04-24 NOTE — Patient Instructions (Signed)
Nice to see you. We will get labs and contact you with the results.  Let me know if you would like the triamcinolone for your legs.

## 2022-04-24 NOTE — Progress Notes (Signed)
[name], MD Phone: [phone]  Toni Berger is a 76 y.o. female who presents today for f/u.  HYPERTENSION Disease Monitoring Home BP Monitoring not checking Chest pain- no    Dyspnea- no Medications Compliance-  taking amlodipine, lisinopril.   Edema- no BMET    Component Value Date/Time   NA 141 01/01/2022 0410   K 3.7 01/01/2022 0410   CL 109 01/01/2022 0410   CO2 26 01/01/2022 0410   GLUCOSE 96 01/01/2022 0410   BUN 25 (H) 01/01/2022 0410   CREATININE 0.89 01/01/2022 0410   CREATININE 0.84 01/19/2020 1522   CALCIUM 9.1 01/01/2022 0410   GFRNONAA >60 01/01/2022 0410   GFRAA >60 04/02/2019 0437   Anxiety/depression: Patient notes this is up-and-down.  She remains on clonazepam and it does not make her drowsy.  There are times where she is not taking it 3 times a day.  No SI.  Chronic pain: Patient with tramadol helped significantly.  She is able to get around with minimal pain when she takes the tramadol.  It does not make her drowsy.  Stasis dermatitis: Notes her legs burn some at night.  She takes clonazepam and that helps ease that off.  She also uses a lotion on it that is beneficial.  Nighttime cough: Patient notes occasionally waking up coughing at night.  She feels as though she is swallowing her saliva wrong.  She notes no burning or sour taste in her throat or burning in her chest.  Social History   Tobacco Use  Smoking Status Former  Smokeless Tobacco Never    Current Outpatient Medications on File Prior to Visit  Medication Sig Dispense Refill   amLODipine (NORVASC) 5 MG tablet TAKE 1 TABLET(5 MG) BY MOUTH DAILY 90 tablet 1   clonazePAM (KLONOPIN) 1 MG tablet TAKE 1 TABLET BY MOUTH EVERY 8 TO 12 HOURS AS NEEDED. MAXIMUM 3 DOSES IN 24 HOURS 90 tablet 0   clopidogrel (PLAVIX) 75 MG tablet Take 1 tablet (75 mg total) by mouth daily. 90 tablet 3   cyanocobalamin 1000 MCG tablet Take 1,000 mcg by mouth every evening.     lisinopril (ZESTRIL) 40 MG  tablet Take 1 tablet (40 mg total) by mouth daily. 90 tablet 3   pantoprazole (PROTONIX) 40 MG tablet TAKE 1 TABLET(40 MG) BY MOUTH TWICE DAILY BEFORE A MEAL 180 tablet 0   traMADol (ULTRAM) 50 MG tablet TAKE 1 TABLET BY MOUTH EVERY 8 HOURS AS NEEDED FOR SEVERE PAIN 90 tablet 0   No current facility-administered medications on file prior to visit.     ROS see history of present illness  Objective  Physical Exam Vitals:   04/24/22 1609  BP: 120/80  Pulse: 78  Temp: 98.6 F (37 C)  SpO2: 96%    BP Readings from Last 3 Encounters:  04/24/22 120/80  01/19/22 120/80  01/01/22 (!) 164/70   Wt Readings from Last 3 Encounters:  04/24/22 201 lb (91.2 kg)  01/19/22 200 lb 3.2 oz (90.8 kg)  01/06/22 206 lb (93.4 kg)    Physical Exam Constitutional:      General: She is not in acute distress.    Appearance: She is not diaphoretic.  Cardiovascular:     Rate and Rhythm: Normal rate and regular rhythm.     Heart sounds: Normal heart sounds.  Pulmonary:     Effort: Pulmonary effort is normal.     Breath sounds: Normal breath sounds.  Skin:    General: Skin is  warm and dry.     Comments: Chronic stasis dermatitis changes in bilateral lower extremities  Neurological:     Mental Status: She is alert.      Assessment/Plan: Please see individual problem list.  Problem List Items Addressed This Visit     Anxiety (Chronic)    Generally stable.  She can continue clonazepam 1 mg every 8-12 hours as needed.      Bilateral leg pain (Chronic)    Chronic issue.  Stable.  Tramadol has been helpful.  She can continue tramadol 50 mg every 8 hours as needed.      GERD (gastroesophageal reflux disease) (Chronic)    Suspect some of her nighttime cough may be related to reflux.  Discussed the potential for changing her Protonix to something else.  Discussed making sure she waits long enough after eating before laying down.  She declines changing her medicine at this time and opts to  monitor.      Hyperlipidemia - Primary (Chronic)    Check lipid panel.      Relevant Orders   Lipid panel   COMPLETE METABOLIC PANEL WITH GFR   HYPERTENSION, BENIGN (Chronic)    Well-controlled.  She will continue lisinopril 40 mg daily and amlodipine 5 mg daily.      Venous insufficiency of both lower extremities (Chronic)    Patient with stasis dermatitis changes in her legs.  Discussed trial of triamcinolone though she declines at this time.  She will let me know if she changes her mind.       Return in about 3 months (around 07/25/2022).   [name], MD [place]

## 2022-04-24 NOTE — Assessment & Plan Note (Signed)
Check lipid panel  

## 2022-04-24 NOTE — Assessment & Plan Note (Signed)
Suspect some of her nighttime cough may be related to reflux.  Discussed the potential for changing her Protonix to something else.  Discussed making sure she waits long enough after eating before laying down.  She declines changing her medicine at this time and opts to monitor.

## 2022-04-24 NOTE — Assessment & Plan Note (Signed)
Well-controlled.  She will continue lisinopril 40 mg daily and amlodipine 5 mg daily.

## 2022-04-24 NOTE — Assessment & Plan Note (Signed)
Chronic issue.  Stable.  Tramadol has been helpful.  She can continue tramadol 50 mg every 8 hours as needed. 

## 2022-04-25 LAB — COMPLETE METABOLIC PANEL WITH GFR
AG Ratio: 1.1 (calc) (ref 1.0–2.5)
ALT: 9 U/L (ref 6–29)
AST: 13 U/L (ref 10–35)
Albumin: 4 g/dL (ref 3.6–5.1)
Alkaline phosphatase (APISO): 123 U/L (ref 37–153)
BUN/Creatinine Ratio: 28 (calc) — ABNORMAL HIGH (ref 6–22)
BUN: 32 mg/dL — ABNORMAL HIGH (ref 7–25)
CO2: 28 mmol/L (ref 20–32)
Calcium: 9.6 mg/dL (ref 8.6–10.4)
Chloride: 101 mmol/L (ref 98–110)
Creat: 1.16 mg/dL — ABNORMAL HIGH (ref 0.60–1.00)
Globulin: 3.5 g/dL (calc) (ref 1.9–3.7)
Glucose, Bld: 75 mg/dL (ref 65–99)
Potassium: 4.4 mmol/L (ref 3.5–5.3)
Sodium: 137 mmol/L (ref 135–146)
Total Bilirubin: 0.5 mg/dL (ref 0.2–1.2)
Total Protein: 7.5 g/dL (ref 6.1–8.1)
eGFR: 49 mL/min/{1.73_m2} — ABNORMAL LOW (ref >=60)

## 2022-04-25 LAB — LIPID PANEL
Cholesterol: 192 mg/dL (ref <200)
HDL: 79 mg/dL (ref >=50)
LDL Cholesterol (Calc): 94 mg/dL (calc)
Non-HDL Cholesterol (Calc): 113 mg/dL (calc) (ref <130)
Total CHOL/HDL Ratio: 2.4 (calc) (ref <5.0)
Triglycerides: 91 mg/dL (ref <150)

## 2022-04-27 ENCOUNTER — Telehealth: Payer: Self-pay

## 2022-04-27 ENCOUNTER — Ambulatory Visit: Payer: Medicare Other | Admitting: Podiatry

## 2022-04-27 NOTE — Telephone Encounter (Signed)
Spoke to the Patient to ask how much water is she drinking and if she is taking any anti inflammatories such as Ibuprofen or Aleve. Patient denies taking any anti inflammatories and is unsure of how much water she is drinking a day. Patient is worried if she drinks too much or water at night she will have to use the bathroom all night. I suggested to take notice of how much water she is taking in and drink more water earlier in the day so she will not have to go to the bathroom so much at night. Patient voiced understanding and agreed to drink more plain water.

## 2022-04-27 NOTE — Telephone Encounter (Signed)
LMTCB

## 2022-04-27 NOTE — Progress Notes (Signed)
Spoke to the Patient to ask how much water is she drinking and if she is taking any anti inflammatories such as Ibuprofen or Aleve. Patient denies taking any anti inflammatories and is unsure of how much water she is drinking a day. Patient is worried if she drinks too much or water at night she will have to use the bathroom all night. I suggested to take notice of how much water she is taking in and drink more water earlier in the day so she will not have to go to the bathroom so much at night. Patient voiced understanding and agreed to drink more plain water

## 2022-04-27 NOTE — Progress Notes (Signed)
LMTCB

## 2022-05-14 ENCOUNTER — Other Ambulatory Visit: Payer: Self-pay | Admitting: Family Medicine

## 2022-05-21 ENCOUNTER — Other Ambulatory Visit: Payer: Self-pay | Admitting: Family Medicine

## 2022-05-30 IMAGING — MG DIGITAL DIAGNOSTIC UNILAT LEFT W/ CAD
3 series · 3 of 3 positions shown · non-contrast
Comparison: Prior films

CLINICAL DATA: Callback from screening mammogram for calcifications
left breast

EXAM:
DIGITAL DIAGNOSTIC left MAMMOGRAM WITH CAD

[L CC]
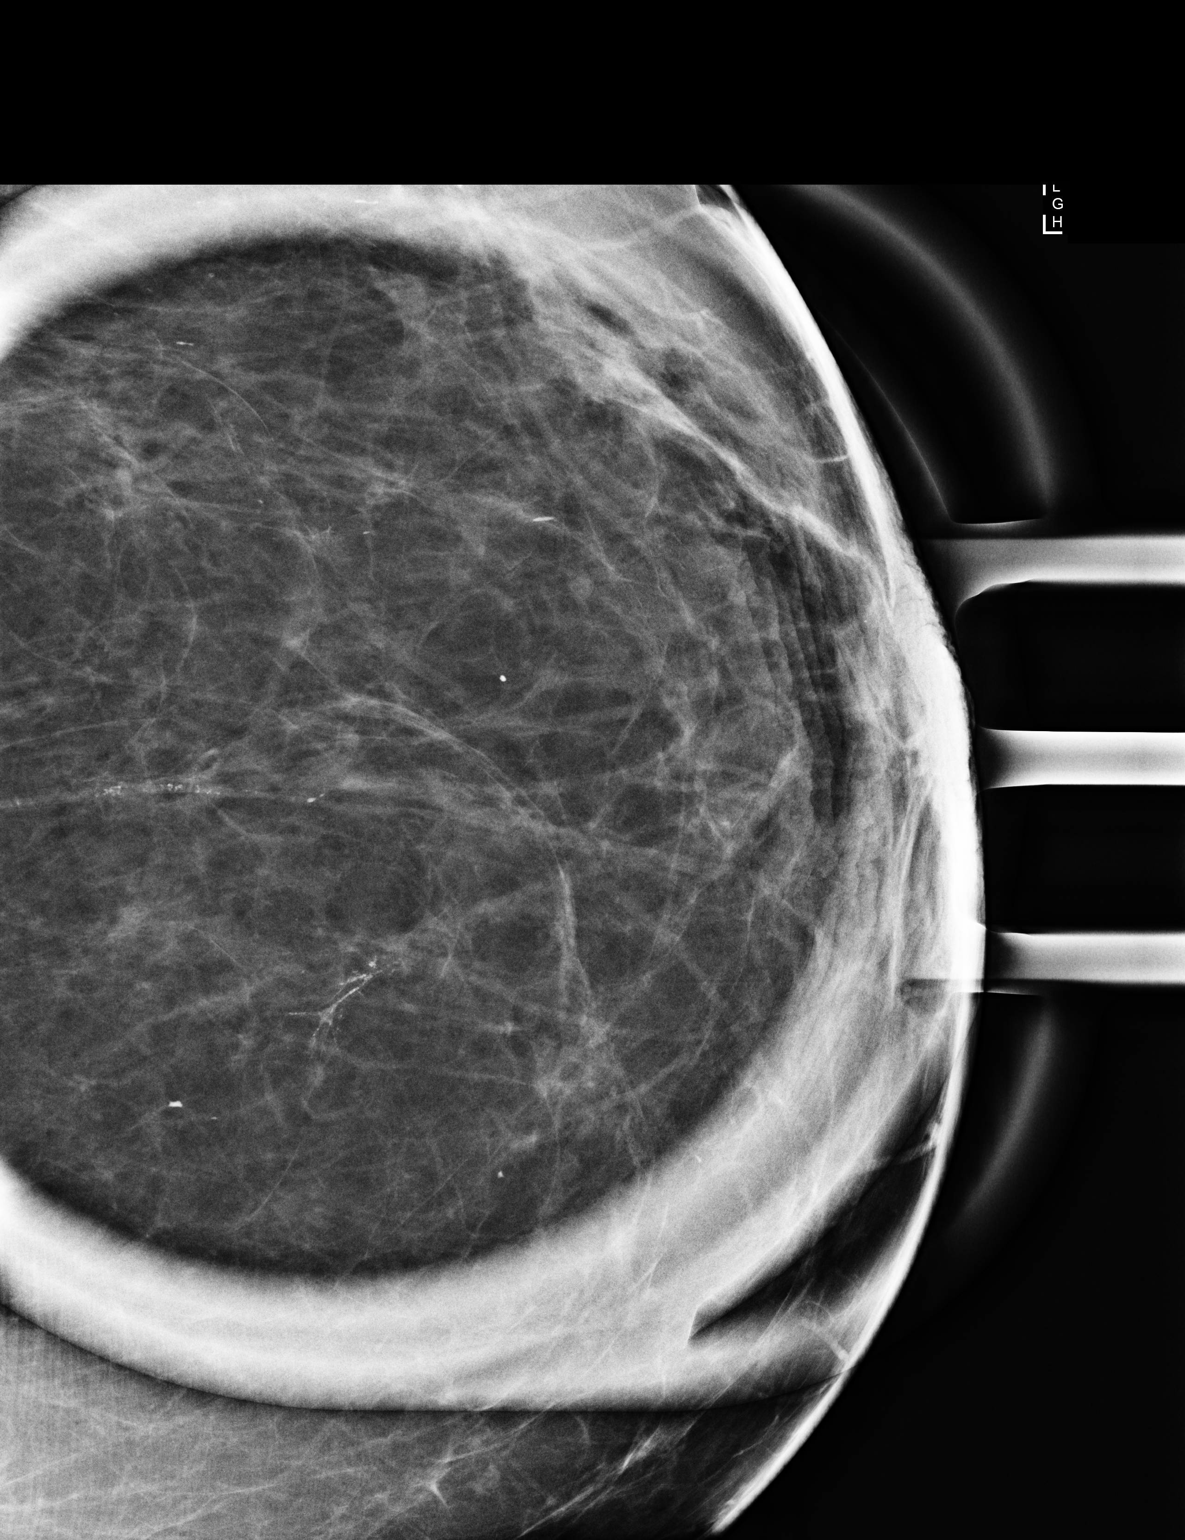

[L ML (1 of 2)]
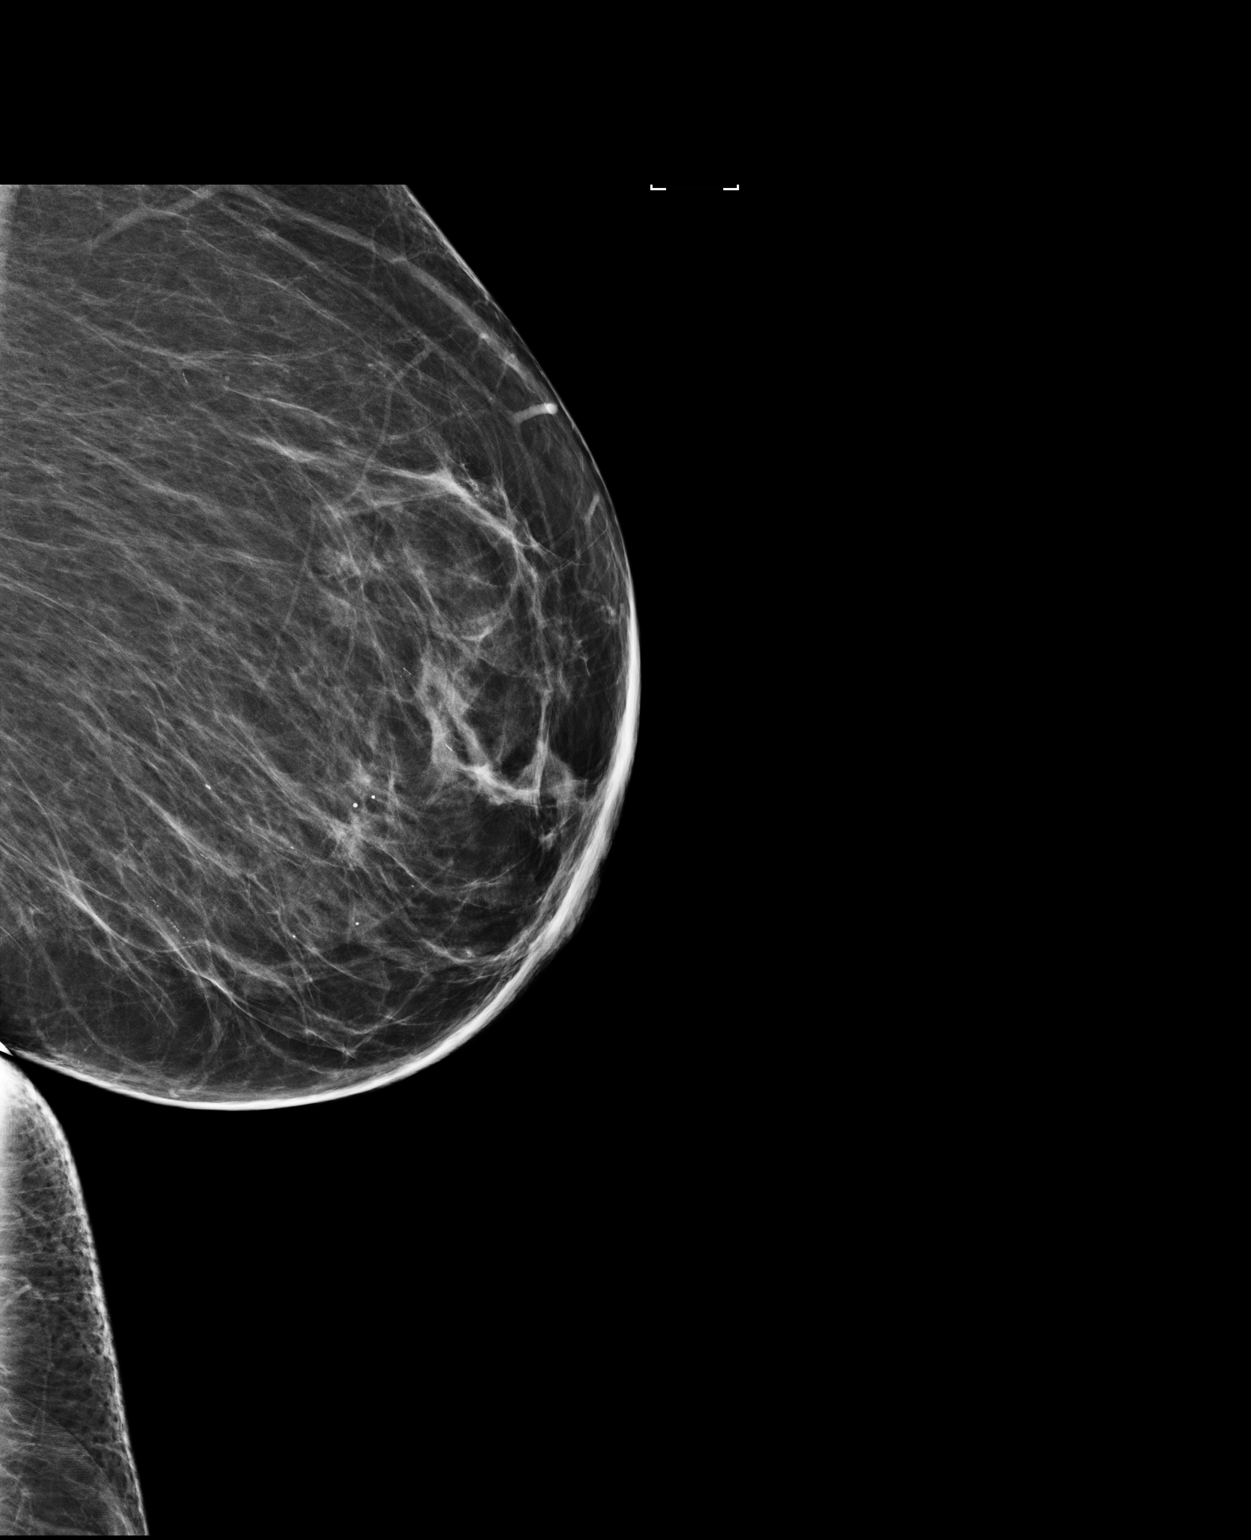

[L ML (2 of 2)]
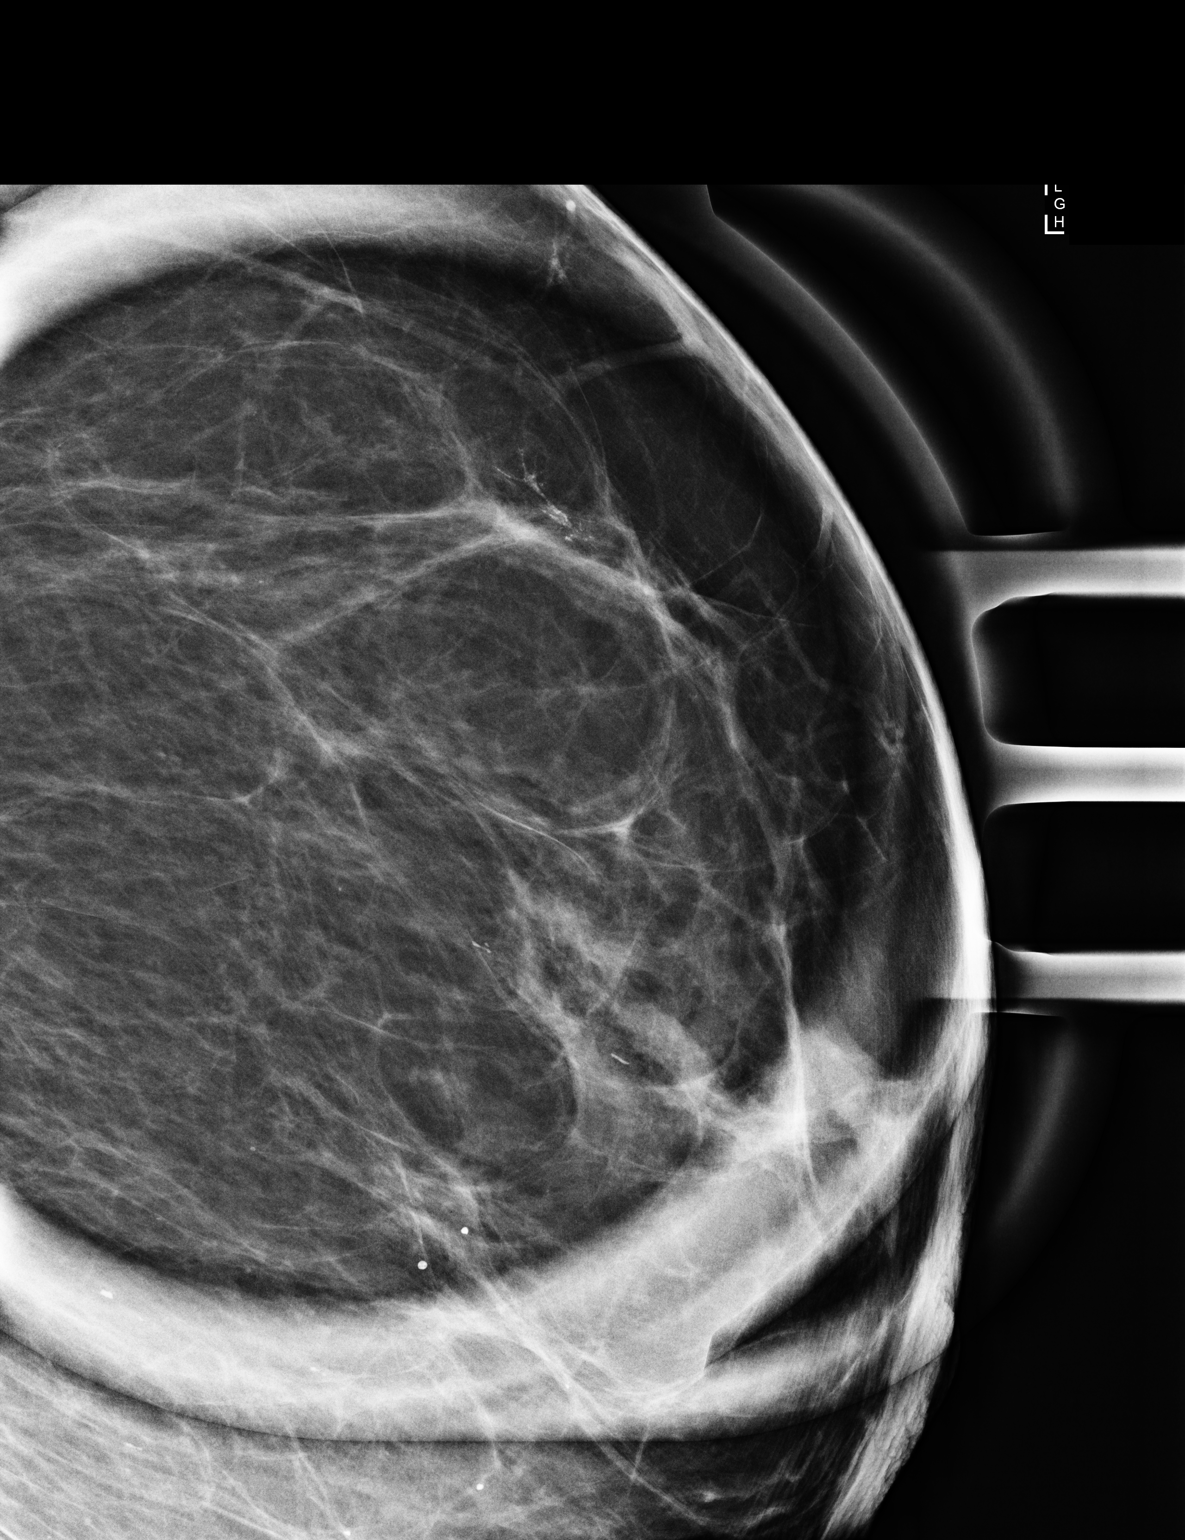

[3 of 3 positions shown; findings below may reference images not displayed]

ACR Breast Density Category b: There are scattered areas of
fibroglandular density.
FINDINGS: Lateral view of left breast, spot magnification cc and lateral views
of the left breast are submitted. There is a group of linear
branching calcifications in the upper slight medial left breast
measuring 1.2 x 1 x 1 cm.

Mammographic images were processed with CAD.
IMPRESSION: Highly suspicious findings for DCIS.

RECOMMENDATION:
Recommend stereotactic core biopsy of left breast calcifications.

I have discussed the findings and recommendations with the patient.
If applicable, a reminder letter will be sent to the patient
regarding the next appointment.

BI-RADS CATEGORY  5: Highly suggestive of malignancy.

## 2022-06-08 ENCOUNTER — Telehealth: Payer: Self-pay | Admitting: Family Medicine

## 2022-06-08 NOTE — Telephone Encounter (Signed)
Spoke to Patient she states she "can not lay still to sleep. "Patient states she is "jittery and jumpy and can not be still. 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 "Patient states "she is taking the same medications and doses and does not know what has caused this but she needs help. Does she need an appointment before 11/23?" Patient states "no burning in her legs." Patient was up at 2:30 this morning washing towels because she could not be still.

## 2022-06-08 NOTE — Telephone Encounter (Signed)
Patient is scheduled for 06/10/22 at 10:00.

## 2022-06-08 NOTE — Telephone Encounter (Signed)
She needs an appointment to evaluate and discuss this further.

## 2022-06-08 NOTE — Telephone Encounter (Signed)
Noted  

## 2022-06-08 NOTE — Telephone Encounter (Signed)
Pt would like to be called. Pt did not say what it was about

## 2022-06-09 ENCOUNTER — Other Ambulatory Visit: Payer: Self-pay | Admitting: Family Medicine

## 2022-06-10 ENCOUNTER — Ambulatory Visit: Payer: Medicare Other | Admitting: Family Medicine

## 2022-06-11 ENCOUNTER — Encounter: Payer: Self-pay | Admitting: Podiatry

## 2022-06-11 ENCOUNTER — Ambulatory Visit (INDEPENDENT_AMBULATORY_CARE_PROVIDER_SITE_OTHER): Payer: Medicare Other | Admitting: Podiatry

## 2022-06-11 DIAGNOSIS — M79676 Pain in unspecified toe(s): Secondary | ICD-10-CM | POA: Diagnosis not present

## 2022-06-11 DIAGNOSIS — D689 Coagulation defect, unspecified: Secondary | ICD-10-CM

## 2022-06-11 DIAGNOSIS — Q828 Other specified congenital malformations of skin: Secondary | ICD-10-CM

## 2022-06-11 DIAGNOSIS — B351 Tinea unguium: Secondary | ICD-10-CM

## 2022-06-11 DIAGNOSIS — G629 Polyneuropathy, unspecified: Secondary | ICD-10-CM | POA: Diagnosis not present

## 2022-06-11 NOTE — Progress Notes (Signed)
This patient returns to my office for at risk foot care.  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  This patient is unable to cut nails herself since the patient cannot reach her nails.These nails are painful walking and wearing shoes.  Patient has forefoot callus left foot which she is wearing dispersion padding. This patient presents for at risk foot care today.  General Appearance  Alert, conversant and in no acute stress.  Vascular  Dorsalis pedis and posterior tibial  pulses are palpable  bilaterally.  Capillary return is within normal limits  bilaterally. Temperature is within normal limits  bilaterally.  Neurologic  Senn-Weinstein monofilament wire test within normal limits  bilaterally. Muscle power within normal limits bilaterally.  Nails Thick disfigured discolored nails with subungual debris  Hallux bilaterally.. No evidence of bacterial infection or drainage bilaterally.  Orthopedic  No limitations of motion  feet .  No crepitus or effusions noted.  No bony pathology or digital deformities noted.  Skin  normotropic skin  noted bilaterally.  No signs of infections or ulcers noted.   Porokeratosis sub 2 left foot.  Onychomycosis  Pain in right toes  Pain in left toes  Porokeratosis  Left forefoot.  Consent was obtained for treatment procedures.   Mechanical debridement of nails 1-5  bilaterally performed with a nail nipper.  Filed with dremel without incident.  Debride callus with # 15 blade. Patient was afraid to remove callus cover since she thought she cause her skin to bleed.   Return office visit   10 weeks                   Told patient to return for periodic foot care and evaluation due to potential at risk complications.   Gardiner Barefoot DPM

## 2022-06-18 ENCOUNTER — Telehealth: Payer: Self-pay | Admitting: *Deleted

## 2022-06-18 ENCOUNTER — Ambulatory Visit: Payer: Medicare Other | Admitting: Podiatry

## 2022-06-18 NOTE — Patient Outreach (Signed)
  Care Coordination   06/18/2022 Name: Toni Berger MRN: [id number] DOB: 1946/02/09   Care Coordination Outreach Attempts:  An unsuccessful telephone outreach was attempted today to offer the patient information about available care coordination services as a benefit of their health plan.   Follow Up Plan:  Additional outreach attempts will be made to offer the patient care coordination information and services.   Encounter Outcome:  No Answer  Care Coordination Interventions Activated:  Yes   Care Coordination Interventions:  No, not indicated    [place] [facility] [phone]

## 2022-06-19 ENCOUNTER — Telehealth: Payer: Self-pay | Admitting: *Deleted

## 2022-06-19 NOTE — Patient Outreach (Signed)
  Care Coordination   Follow Up Visit Note   06/19/2022 Name: Toni Berger MRN: [id number] DOB: 1945/11/08  Toni Berger is a 76 y.o. year old female who sees Caryl Bis, Angela Adam, MD for primary care. I spoke with  [name] by phone today.  What matters to the patients health and wellness today?  No health concerns voiced. Patient stated that she has blocked [name] number and is aware of her health insurance and benefits. She does not want any calls.  Follow up plan: No further intervention required.   Encounter Outcome:  Pt. [facility] [phone]

## 2022-06-29 ENCOUNTER — Encounter: Payer: Self-pay | Admitting: Family Medicine

## 2022-06-30 NOTE — Telephone Encounter (Signed)
Signed. Please make available for pick up.  

## 2022-07-10 ENCOUNTER — Ambulatory Visit (INDEPENDENT_AMBULATORY_CARE_PROVIDER_SITE_OTHER): Payer: Medicare Other | Admitting: Family

## 2022-07-10 ENCOUNTER — Encounter: Payer: Self-pay | Admitting: Family

## 2022-07-10 VITALS — BP 128/80 | HR 81 | Temp 97.5°F | Ht 69.0 in | Wt 200.4 lb

## 2022-07-10 DIAGNOSIS — L0292 Furuncle, unspecified: Secondary | ICD-10-CM | POA: Insufficient documentation

## 2022-07-10 MED ORDER — MUPIROCIN 2 % EX OINT: 1.0000 | TOPICAL_OINTMENT | Freq: Two times a day (BID) | CUTANEOUS | 2 refills | Status: DC

## 2022-07-10 NOTE — Progress Notes (Signed)
Subjective:    Patient ID: Toni Berger, female    DOB: 02-Jul-1946, 76 y.o.   MRN: [id number]  CC: Toni Berger is a 76 y.o. female who presents today for an acute visit.    HPI: Complains of non tender left boil in her groin which is suspected started about 2 weeks ago.  Yesterday started spontaneously draining and she has been wearing a depends.  Purulence and blood seen.   History of hypertension, GI bleed, aortic atherosclerosis, CVA Crt 1.16  2 months ago  No fever.  Otherwise she feels well.  She has not tried any medication at home. No previous history of boil  No h/o mrsa HISTORY:  Past Medical History:  Diagnosis Date   Allergy    Anxiety    Arthritis    Closed rib fracture 01/09/2016   Fibrocystic breast    Hyperlipidemia    Hypertension    Lipoma    5/01 Abdominal wall lipoma   Loss of consciousness ([place]) 12/15/2015   Migraines    Osteoporosis, unspecified    Restless leg syndrome    Stroke (cerebrum) (HCC)    Upper GI bleed 03/27/2019   Venous stasis dermatitis 01/19/2020   Past Surgical History:  Procedure Laterality Date   ABDOMINAL HYSTERECTOMY     APPENDECTOMY  1999   BREAST CYST ASPIRATION     CHOLECYSTECTOMY  1999   DOBUTAMINE STRESS ECHO  06/96   negative   ESOPHAGOGASTRODUODENOSCOPY N/A 03/30/2019   Procedure: ESOPHAGOGASTRODUODENOSCOPY (EGD);  Surgeon: Lin Landsman, MD;  Location: [facility] ENDOSCOPY;  Service: Gastroenterology;  Laterality: N/A;   GIVENS CAPSULE STUDY N/A 03/31/2019   Procedure: GIVENS CAPSULE STUDY;  Surgeon: Lin Landsman, MD;  Location: [facility] ENDOSCOPY;  Service: Gastroenterology;  Laterality: N/A;   TONSILLECTOMY AND ADENOIDECTOMY  1974   TOTAL ABDOMINAL HYSTERECTOMY W/ BILATERAL SALPINGOOPHORECTOMY  1981   Family History  Problem Relation Age of Onset   Heart disease Mother    Diabetes Mother    Aneurysm Mother        AAA   Heart failure Mother    Heart failure Father    Coronary artery disease Father     Colon cancer Neg Hx    Breast cancer Neg Hx     Allergies: Carbidopa-levodopa, Prednisone, Sulfonamide derivatives, Aquasonic 100 [ultrasound gel], Ciprofloxacin, Oxycodone, Potassium, Povidone-iodine, Propoxyphene hcl, Sertraline hcl, Tetracycline, Acetaminophen, Metoprolol, and Pravastatin Current Outpatient Medications on File Prior to Visit  Medication Sig Dispense Refill   amLODipine (NORVASC) 5 MG tablet TAKE 1 TABLET(5 MG) BY MOUTH DAILY 90 tablet 1   clonazePAM (KLONOPIN) 1 MG tablet TAKE 1 TABLET BY MOUTH EVERY 8 TO 12 HOURS AS NEEDED. MAXIMUM 3 DOSES IN 24 HOURS 90 tablet 0   clopidogrel (PLAVIX) 75 MG tablet TAKE 1 TABLET(75 MG) BY MOUTH DAILY 90 tablet 3   cyanocobalamin 1000 MCG tablet Take 1,000 mcg by mouth every evening.     lisinopril (ZESTRIL) 40 MG tablet Take 1 tablet (40 mg total) by mouth daily. 90 tablet 3   pantoprazole (PROTONIX) 40 MG tablet TAKE 1 TABLET(40 MG) BY MOUTH TWICE DAILY BEFORE A MEAL 180 tablet 0   traMADol (ULTRAM) 50 MG tablet TAKE 1 TABLET BY MOUTH EVERY 8 HOURS AS NEEDED FOR SEVERE PAIN 90 tablet 0   No current facility-administered medications on file prior to visit.    Social History   Tobacco Use   Smoking status: Former   Smokeless tobacco: Never  Substance Use  Topics   Alcohol use: No   Drug use: No    Review of Systems  Constitutional:  Negative for chills and fever.  Respiratory:  Negative for cough.   Cardiovascular:  Negative for chest pain and palpitations.  Gastrointestinal:  Negative for nausea and vomiting.  Skin:  Positive for rash.      Objective:    BP 128/80 (BP Location: Left Arm, Patient Position: Sitting, Cuff Size: Normal)   Pulse 81   Temp (!) 97.5 F (36.4 C) (Oral)   Ht '5\' 9"'$  (1.753 m)   Wt 200 lb 6.4 oz (90.9 kg)   SpO2 96%   BMI 29.59 kg/m    Physical Exam Vitals reviewed.  Constitutional:      Appearance: She is well-developed.  Eyes:     Conjunctiva/sclera: Conjunctivae normal.   Cardiovascular:     Rate and Rhythm: Normal rate and regular rhythm.     Pulses: Normal pulses.     Heart sounds: Normal heart sounds.  Pulmonary:     Effort: Pulmonary effort is normal.     Breath sounds: Normal breath sounds. No wheezing, rhonchi or rales.  Skin:    General: Skin is warm and dry.          Comments: 2-3 nonfluctuant draining abscess noted left groin.  No red streaks, increased heat . No  tenderness with palpation  Neurological:     Mental Status: She is alert.  Psychiatric:        Speech: Speech normal.        Behavior: Behavior normal.        Thought Content: Thought content normal.        Assessment & Plan:   Problem List Items Addressed This Visit       Musculoskeletal and Integument   Boil - Primary    No systemic features.  Patient well-appearing today.  On exam, abscess appears to spontaneously resolved and completely drained.  No tenderness or  fluctuance appreciated.  Discussed with patient I do not think incision and drainage would be necessary at this time.  Patient has extensive drug allergies I was hesitant about starting oral medication.  We did discuss Keflex.  She has an allergy to tetracycline though unknown reaction, so understandably she had hesitation to starting doxycycline to cover for MRSA.  We opted to use Bactroban and continue warm compresses.  Return precautions given to patient.  She will let me know how she is doing      Relevant Medications   mupirocin ointment (BACTROBAN) 2 %      I am having Toni Berger start on mupirocin ointment. I am also having her maintain her cyanocobalamin, lisinopril, amLODipine, pantoprazole, clopidogrel, clonazePAM, and traMADol.   Meds ordered this encounter  Medications   mupirocin ointment (BACTROBAN) 2 %    Sig: Apply 1 Application topically 2 (two) times daily.    Dispense:  22 g    Refill:  2    Order Specific Question:   Supervising Provider    Answer:   Crecencio Mc [[id number]]     Return precautions given.   Risks, benefits, and alternatives of the medications and treatment plan prescribed today were discussed, and patient expressed understanding.   Education regarding symptom management and diagnosis given to patient on AVS.  Continue to follow with Leone Haven, MD for routine health maintenance.   Toni Berger and I agreed with plan.   Mable Paris, FNP

## 2022-07-10 NOTE — Patient Instructions (Signed)
Start Bactroban ointment which is a topical antibiotic that covers for staph infection.  Please continue warm compresses.  We could also consider an oral antibiotic called cephalexin (Keflex)  as discussed if area becomes more tender or red or you develop fever, please take no chances at all and go to the nearest urgent care for in person evaluation.  Please let me know how you are doing   Skin Abscess  A skin abscess is an infected area of your skin that contains pus and other material. An abscess can happen in any part of your body. Some abscesses break open (rupture) on their own. Most continue to get worse unless they are treated. The infection can spread deeper into the body and into your blood, which can make you feel sick. A skin abscess is caused by germs that enter the skin through a cut or scrape. It can also be caused by blocked oil and sweat glands or infected hair follicles. This condition is usually treated by: Draining the pus. Taking antibiotic medicines. Placing a warm, wet washcloth over the abscess. Follow these instructions at home: Medicines  Take over-the-counter and prescription medicines only as told by your doctor. If you were prescribed an antibiotic medicine, take it as told by your doctor. Do not stop taking the antibiotic even if you start to feel better. Abscess care  If you have an abscess that has not drained, place a warm, clean, wet washcloth over the abscess several times a day. Do this as told by your doctor. Follow instructions from your doctor about how to take care of your abscess. Make sure you: Cover the abscess with a bandage (dressing). Change your bandage or gauze as told by your doctor. Wash your hands with soap and water before you change the bandage or gauze. If you cannot use soap and water, use hand sanitizer. Check your abscess every day for signs that the infection is getting worse. Check for: More redness, swelling, or pain. More fluid or  blood. Warmth. More pus or a bad smell. General instructions To avoid spreading the infection: Do not share personal care items, towels, or hot tubs with others. Avoid making skin-to-skin contact with other people. Keep all follow-up visits as told by your doctor. This is important. Contact a doctor if: You have more redness, swelling, or pain around your abscess. You have more fluid or blood coming from your abscess. Your abscess feels warm when you touch it. You have more pus or a bad smell coming from your abscess. Your muscles ache. You feel sick. Get help right away if: You have very bad (severe) pain. You see red streaks on your skin spreading away from the abscess. You see redness that spreads quickly. You have a fever or chills. Summary A skin abscess is an infected area of your skin that contains pus and other material. The abscess is caused by germs that enter the skin through a cut or scrape. It can also be caused by blocked oil and sweat glands or infected hair follicles. Follow your doctor's instructions on caring for your abscess, taking medicines, preventing infections, and keeping follow-up visits. This information is not intended to replace advice given to you by your health care provider. Make sure you discuss any questions you have with your health care provider. Document Revised: 12/04/2021 Document Reviewed: 06/09/2021 Elsevier Patient Education  Toni Berger.

## 2022-07-10 NOTE — Assessment & Plan Note (Signed)
No systemic features.  Patient well-appearing today.  On exam, abscess appears to spontaneously resolved and completely drained.  No tenderness or  fluctuance appreciated.  Discussed with patient I do not think incision and drainage would be necessary at this time.  Patient has extensive drug allergies I was hesitant about starting oral medication.  We did discuss Keflex.  She has an allergy to tetracycline though unknown reaction, so understandably she had hesitation to starting doxycycline to cover for MRSA.  We opted to use Bactroban and continue warm compresses.  Return precautions given to patient.  She will let me know how she is doing

## 2022-07-12 ENCOUNTER — Other Ambulatory Visit: Payer: Self-pay | Admitting: Family Medicine

## 2022-07-16 ENCOUNTER — Ambulatory Visit: Payer: Medicare Other | Admitting: Podiatry

## 2022-07-28 ENCOUNTER — Ambulatory Visit (INDEPENDENT_AMBULATORY_CARE_PROVIDER_SITE_OTHER): Payer: Medicare Other | Admitting: Family Medicine

## 2022-07-28 ENCOUNTER — Encounter: Payer: Self-pay | Admitting: Family Medicine

## 2022-07-28 VITALS — BP 130/80 | HR 65 | Temp 97.9°F | Resp 14 | Ht 69.0 in | Wt 199.4 lb

## 2022-07-28 DIAGNOSIS — L0292 Furuncle, unspecified: Secondary | ICD-10-CM

## 2022-07-28 DIAGNOSIS — I10 Essential (primary) hypertension: Secondary | ICD-10-CM | POA: Diagnosis not present

## 2022-07-28 DIAGNOSIS — M79604 Pain in right leg: Secondary | ICD-10-CM | POA: Diagnosis not present

## 2022-07-28 DIAGNOSIS — M779 Enthesopathy, unspecified: Secondary | ICD-10-CM | POA: Diagnosis not present

## 2022-07-28 DIAGNOSIS — F419 Anxiety disorder, unspecified: Secondary | ICD-10-CM

## 2022-07-28 DIAGNOSIS — M79605 Pain in left leg: Secondary | ICD-10-CM

## 2022-07-28 LAB — BASIC METABOLIC PANEL
BUN: 27 mg/dL — ABNORMAL HIGH (ref 6–23)
CO2: 29 mEq/L (ref 19–32)
Calcium: 9.5 mg/dL (ref 8.4–10.5)
Chloride: 102 mEq/L (ref 96–112)
Creatinine, Ser: 1 mg/dL (ref 0.40–1.20)
GFR: 54.74 mL/min — ABNORMAL LOW (ref >60.00)
Glucose, Bld: 100 mg/dL — ABNORMAL HIGH (ref 70–99)
Potassium: 4 mEq/L (ref 3.5–5.1)
Sodium: 138 mEq/L (ref 135–145)

## 2022-07-28 NOTE — Assessment & Plan Note (Signed)
Reported on foot. She will continue to see podiatry.

## 2022-07-28 NOTE — Assessment & Plan Note (Signed)
Stable.  She can continue clonazepam 1 mg every 8-12 hours as needed.  Monitor for drowsiness.

## 2022-07-28 NOTE — Progress Notes (Signed)
[name], MD Phone: [phone]  Toni Berger is a 76 y.o. female who presents today for f/u.  HYPERTENSION Disease Monitoring Home BP Monitoring not checking Chest pain- no    Dyspnea- no Medications Compliance-  taking amlodipine, lisinopril.   Edema- no BMET    Component Value Date/Time   NA 137 04/24/2022 1621   K 4.4 04/24/2022 1621   CL 101 04/24/2022 1621   CO2 28 04/24/2022 1621   GLUCOSE 75 04/24/2022 1621   BUN 32 (H) 04/24/2022 1621   CREATININE 1.16 (H) 04/24/2022 1621   CALCIUM 9.6 04/24/2022 1621   GFRNONAA >60 01/01/2022 0410   GFRAA >60 04/02/2019 0437   Chronic pain: Continues to take tramadol with good benefit.  No drowsiness with this.  Anxiety: Continues to take clonazepam for this and for restless legs.  Notes it is helpful.  No drowsiness with this medication.  She does not take clonazepam and tramadol at the same time. She does reports still missing her husband and getting tearful when thinking about him. She is not interested in adding any additional medication for treating anxiety or depression.   Ingrown hair: Patient was seen by FNP in this office and the patient know she was not happy with the encounter as the nurse practitioner did not drain the ingrown hair and just wanted to put her on antibiotics.  Patient notes the area has resolved at this time.  Bone spur: reports seeing podiatry for this and they are holding off on any surgery for this at this time.   Social History   Tobacco Use  Smoking Status Former  Smokeless Tobacco Never    Current Outpatient Medications on File Prior to Visit  Medication Sig Dispense Refill   amLODipine (NORVASC) 5 MG tablet TAKE 1 TABLET(5 MG) BY MOUTH DAILY 90 tablet 1   clonazePAM (KLONOPIN) 1 MG tablet TAKE 1 TABLET BY MOUTH EVERY 8 TO 12 HOURS AS NEEDED. MAXIMUM 3 DOSES IN 24 HOURS 90 tablet 0   clopidogrel (PLAVIX) 75 MG tablet TAKE 1 TABLET(75 MG) BY MOUTH DAILY 90 tablet 3   cyanocobalamin 1000  MCG tablet Take 1,000 mcg by mouth every evening.     lisinopril (ZESTRIL) 40 MG tablet TAKE 1 TABLET(40 MG) BY MOUTH DAILY 90 tablet 3   pantoprazole (PROTONIX) 40 MG tablet TAKE 1 TABLET(40 MG) BY MOUTH TWICE DAILY BEFORE A MEAL 180 tablet 0   traMADol (ULTRAM) 50 MG tablet TAKE 1 TABLET BY MOUTH EVERY 8 HOURS AS NEEDED FOR SEVERE PAIN 90 tablet 0   No current facility-administered medications on file prior to visit.     ROS see history of present illness  Objective  Physical Exam Vitals:   07/28/22 0957  BP: 130/80  Pulse: 65  Resp: 14  Temp: 97.9 F (36.6 C)  SpO2: 98%    BP Readings from Last 3 Encounters:  07/28/22 130/80  07/10/22 128/80  04/24/22 120/80   Wt Readings from Last 3 Encounters:  07/28/22 199 lb 6.4 oz (90.4 kg)  07/10/22 200 lb 6.4 oz (90.9 kg)  04/24/22 201 lb (91.2 kg)    Physical Exam Constitutional:      General: She is not in acute distress.    Appearance: She is not diaphoretic.  Cardiovascular:     Rate and Rhythm: Normal rate and regular rhythm.     Heart sounds: Normal heart sounds.  Pulmonary:     Effort: Pulmonary effort is normal.     Breath sounds:  Normal breath sounds.  Skin:    General: Skin is warm and dry.  Neurological:     Mental Status: She is alert.      Assessment/Plan: Please see individual problem list.  Problem List Items Addressed This Visit     Anxiety (Chronic)    Stable.  She can continue clonazepam 1 mg every 8-12 hours as needed.  Monitor for drowsiness.      Bilateral leg pain (Chronic)    Chronic issue.  Stable.  Tramadol has been helpful.  She can continue tramadol 50 mg every 8 hours as needed.      HYPERTENSION, BENIGN - Primary (Chronic)    Adequate control for age.  She will continue amlodipine 5 mg daily and lisinopril 40 mg daily.  Kidney function was slightly off from her baseline on her last labs.  We will recheck a BMP today.      Relevant Orders   Basic Metabolic Panel (BMET)   Boil     Patient reports this has resolved.  I reviewed the note and management seem to be acceptable based on documentation.      Bone spur    Reported on foot. She will continue to see podiatry.         Return in about 3 months (around 10/28/2022) for Anxiety/chronic pain.   [name], MD [place]

## 2022-07-28 NOTE — Assessment & Plan Note (Signed)
Patient reports this has resolved.  I reviewed the note and management seem to be acceptable based on documentation.

## 2022-07-28 NOTE — Patient Instructions (Signed)
Nice to see you. If you have recurrence of your ingrown hair or boil please let us know. We will contact you with your lab results.

## 2022-07-28 NOTE — Assessment & Plan Note (Addendum)
Adequate control for age.  She will continue amlodipine 5 mg daily and lisinopril 40 mg daily.  Kidney function was slightly off from her baseline on her last labs.  We will recheck a BMP today.

## 2022-07-28 NOTE — Assessment & Plan Note (Signed)
Chronic issue.  Stable.  Tramadol has been helpful.  She can continue tramadol 50 mg every 8 hours as needed.

## 2022-07-29 ENCOUNTER — Encounter: Payer: Self-pay | Admitting: Family Medicine

## 2022-08-09 ENCOUNTER — Other Ambulatory Visit: Payer: Self-pay | Admitting: Family Medicine

## 2022-09-03 ENCOUNTER — Telehealth: Payer: Self-pay | Admitting: Family Medicine

## 2022-09-04 NOTE — Telephone Encounter (Signed)
Pt need a refill on tramadol and clonazePAM sent to [facility]

## 2022-09-08 NOTE — Telephone Encounter (Signed)
Patient call about having her traMADol (ULTRAM) 50 MG tablet and clonazePAM (KLONOPIN) 1 MG tablet refilled on 09/10/2022.

## 2022-09-09 NOTE — Telephone Encounter (Signed)
These were refilled on 12/22.

## 2022-09-15 ENCOUNTER — Encounter: Payer: Self-pay | Admitting: Family Medicine

## 2022-09-15 DIAGNOSIS — K219 Gastro-esophageal reflux disease without esophagitis: Secondary | ICD-10-CM

## 2022-09-15 MED ORDER — PANTOPRAZOLE SODIUM 40 MG PO TBEC: DELAYED_RELEASE_TABLET | ORAL | 1 refills | Status: DC

## 2022-09-17 ENCOUNTER — Ambulatory Visit: Payer: Medicare Other | Admitting: Podiatry

## 2022-09-23 ENCOUNTER — Encounter: Payer: Self-pay | Admitting: Family Medicine

## 2022-09-23 ENCOUNTER — Other Ambulatory Visit: Payer: Self-pay | Admitting: Family Medicine

## 2022-09-23 MED ORDER — TRAMADOL HCL 50 MG PO TABS: ORAL_TABLET | ORAL | 0 refills | Status: DC

## 2022-09-23 MED ORDER — CLONAZEPAM 1 MG PO TABS: ORAL_TABLET | ORAL | 0 refills | Status: DC

## 2022-10-06 ENCOUNTER — Other Ambulatory Visit: Payer: Self-pay | Admitting: Family Medicine

## 2022-10-06 DIAGNOSIS — K219 Gastro-esophageal reflux disease without esophagitis: Secondary | ICD-10-CM

## 2022-10-28 ENCOUNTER — Ambulatory Visit (INDEPENDENT_AMBULATORY_CARE_PROVIDER_SITE_OTHER): Payer: Medicare Other | Admitting: Family Medicine

## 2022-10-28 ENCOUNTER — Encounter: Payer: Self-pay | Admitting: Family Medicine

## 2022-10-28 VITALS — BP 120/72 | HR 61 | Temp 97.6°F | Resp 16 | Ht 69.0 in | Wt 197.4 lb

## 2022-10-28 DIAGNOSIS — M79604 Pain in right leg: Secondary | ICD-10-CM

## 2022-10-28 DIAGNOSIS — F419 Anxiety disorder, unspecified: Secondary | ICD-10-CM

## 2022-10-28 DIAGNOSIS — M79605 Pain in left leg: Secondary | ICD-10-CM | POA: Diagnosis not present

## 2022-10-28 DIAGNOSIS — R944 Abnormal results of kidney function studies: Secondary | ICD-10-CM

## 2022-10-28 LAB — BASIC METABOLIC PANEL
BUN: 29 mg/dL — ABNORMAL HIGH (ref 6–23)
CO2: 30 mEq/L (ref 19–32)
Calcium: 9.6 mg/dL (ref 8.4–10.5)
Chloride: 102 mEq/L (ref 96–112)
Creatinine, Ser: 1.02 mg/dL (ref 0.40–1.20)
GFR: 53.36 mL/min — ABNORMAL LOW (ref >60.00)
Glucose, Bld: 83 mg/dL (ref 70–99)
Potassium: 3.9 mEq/L (ref 3.5–5.1)
Sodium: 137 mEq/L (ref 135–145)

## 2022-10-28 MED ORDER — CLONAZEPAM 1 MG PO TABS: ORAL_TABLET | ORAL | 0 refills | Status: DC

## 2022-10-28 MED ORDER — TRAMADOL HCL 50 MG PO TABS: ORAL_TABLET | ORAL | 0 refills | Status: DC

## 2022-10-28 NOTE — Assessment & Plan Note (Signed)
Chronic issue.  Stable.  She can continue tramadol 50 mg every 8 hours as needed for pain.

## 2022-10-28 NOTE — Progress Notes (Signed)
  [name], MD Phone: [phone]  Toni Berger is a 77 y.o. female who presents today for follow-up.  Anxiety: Patient notes this is okay.  She takes her clonazepam mostly at night.  No depression.  She gets antsy she gets up and does something at night.  Chronic pain: Patient is tramadol is working adequately for her.  She takes it 3 times daily.  There is no drowsiness with this.  Reduced GFR: Plan was to recheck labs today.  Social History   Tobacco Use  Smoking Status Former  Smokeless Tobacco Never    Current Outpatient Medications on File Prior to Visit  Medication Sig Dispense Refill   amLODipine (NORVASC) 5 MG tablet TAKE 1 TABLET(5 MG) BY MOUTH DAILY 90 tablet 1   clopidogrel (PLAVIX) 75 MG tablet TAKE 1 TABLET(75 MG) BY MOUTH DAILY 90 tablet 3   cyanocobalamin 1000 MCG tablet Take 1,000 mcg by mouth every evening.     lisinopril (ZESTRIL) 40 MG tablet TAKE 1 TABLET(40 MG) BY MOUTH DAILY 90 tablet 3   pantoprazole (PROTONIX) 40 MG tablet TAKE 1 TABLET(40 MG) BY MOUTH TWICE DAILY BEFORE A MEAL 180 tablet 1   No current facility-administered medications on file prior to visit.     ROS see history of present illness  Objective  Physical Exam Vitals:   10/28/22 0958  BP: 120/72  Pulse: 61  Resp: 16  Temp: 97.6 F (36.4 C)  SpO2: 99%    BP Readings from Last 3 Encounters:  10/28/22 120/72  07/28/22 130/80  07/10/22 128/80   Wt Readings from Last 3 Encounters:  10/28/22 197 lb 6 oz (89.5 kg)  07/28/22 199 lb 6.4 oz (90.4 kg)  07/10/22 200 lb 6.4 oz (90.9 kg)    Physical Exam Constitutional:      General: She is not in acute distress.    Appearance: She is not diaphoretic.  Cardiovascular:     Rate and Rhythm: Normal rate and regular rhythm.     Heart sounds: Normal heart sounds.  Pulmonary:     Effort: Pulmonary effort is normal.     Breath sounds: Normal breath sounds.  Skin:    General: Skin is warm and dry.  Neurological:      Mental Status: She is alert.      Assessment/Plan: Please see individual problem list.  Anxiety Assessment & Plan: Chronic issue.  Adequately controlled.  She can continue clonazepam 1 tablet by mouth every 8-12 hours as needed.  Refill provided.  Controlled substance database reviewed.  Orders: -     clonazePAM; TAKE 1 TABLET BY MOUTH EVERY 8 TO 12 HOURS AS NEEDED. MAXIMUM 3 DOSES IN 24 HOURS  Dispense: 90 tablet; Refill: 0  Bilateral leg pain Assessment & Plan: Chronic issue.  Stable.  She can continue tramadol 50 mg every 8 hours as needed for pain.  Orders: -     traMADol HCl; TAKE 1 TABLET BY MOUTH EVERY 8 HOURS AS NEEDED FOR SEVERE PAIN  Dispense: 90 tablet; Refill: 0  Decreased GFR Assessment & Plan: Possibly related to AKI.  Will recheck kidney function today.  Orders: -     Basic metabolic panel    Return in about 3 months (around 01/26/2023).   [name], MD [place]

## 2022-10-28 NOTE — Patient Instructions (Signed)
Nice to see you. Will get lab work today and contact with results.

## 2022-10-28 NOTE — Assessment & Plan Note (Signed)
Possibly related to AKI.  Will recheck kidney function today.

## 2022-10-28 NOTE — Assessment & Plan Note (Signed)
Chronic issue.  Adequately controlled.  She can continue clonazepam 1 tablet by mouth every 8-12 hours as needed.  Refill provided.  Controlled substance database reviewed.

## 2022-11-19 ENCOUNTER — Ambulatory Visit: Payer: Medicare Other | Admitting: Podiatry

## 2022-11-24 ENCOUNTER — Other Ambulatory Visit: Payer: Self-pay | Admitting: Family Medicine

## 2022-11-24 DIAGNOSIS — M79604 Pain in right leg: Secondary | ICD-10-CM

## 2022-11-24 DIAGNOSIS — F419 Anxiety disorder, unspecified: Secondary | ICD-10-CM

## 2022-11-25 NOTE — Telephone Encounter (Signed)
Okay to refill?  LOV: 10/28/22  NOV: 01/27/23

## 2022-12-22 ENCOUNTER — Telehealth: Payer: Self-pay | Admitting: Family Medicine

## 2022-12-22 ENCOUNTER — Encounter: Payer: Self-pay | Admitting: Podiatry

## 2022-12-22 DIAGNOSIS — M79604 Pain in right leg: Secondary | ICD-10-CM

## 2022-12-22 DIAGNOSIS — F419 Anxiety disorder, unspecified: Secondary | ICD-10-CM

## 2022-12-24 NOTE — Telephone Encounter (Signed)
Pt need a refill on clonazePAM and traMADol sent to [facility]

## 2022-12-25 ENCOUNTER — Encounter: Payer: Self-pay | Admitting: Family Medicine

## 2022-12-25 DIAGNOSIS — F419 Anxiety disorder, unspecified: Secondary | ICD-10-CM

## 2022-12-28 MED ORDER — CLONAZEPAM 0.5 MG PO TABS: ORAL_TABLET | ORAL | 0 refills | Status: DC

## 2022-12-29 NOTE — Telephone Encounter (Signed)
Prescriptions sent

## 2022-12-30 ENCOUNTER — Ambulatory Visit (INDEPENDENT_AMBULATORY_CARE_PROVIDER_SITE_OTHER): Payer: Medicare Other | Admitting: Podiatry

## 2022-12-30 ENCOUNTER — Encounter: Payer: Self-pay | Admitting: Podiatry

## 2022-12-30 DIAGNOSIS — D2372 Other benign neoplasm of skin of left lower limb, including hip: Secondary | ICD-10-CM

## 2022-12-30 DIAGNOSIS — B351 Tinea unguium: Secondary | ICD-10-CM | POA: Diagnosis not present

## 2022-12-30 DIAGNOSIS — M79609 Pain in unspecified limb: Secondary | ICD-10-CM

## 2022-12-30 DIAGNOSIS — D689 Coagulation defect, unspecified: Secondary | ICD-10-CM

## 2022-12-30 NOTE — Progress Notes (Signed)
Toni Berger presents today chief complaint of painful elongated toenails and calluses.  Objective: Toenails are long thick yellow dystrophic clinically mycotic with painful hyperkeratotic lesions.  No open lesions or wounds are noted.  Assessment: Pain limb secondary to onychomycosis and benign skin lesions.  Plan: She will follow-up with Dr. Donzetta Matters next visit otherwise I debrided nails 1 through 5 bilateral debrided all reactive hyperkeratotic tissue.

## 2022-12-31 ENCOUNTER — Encounter: Payer: Self-pay | Admitting: Podiatry

## 2023-01-04 ENCOUNTER — Ambulatory Visit: Payer: Medicare Other | Admitting: Podiatry

## 2023-01-12 ENCOUNTER — Telehealth: Payer: Self-pay | Admitting: Family Medicine

## 2023-01-12 NOTE — Telephone Encounter (Signed)
Contacted Toni Berger to schedule their annual wellness visit. Appointment made for 01/21/2023.  Thank you,  [facility] Support [facility] Direct dial  [phone]

## 2023-01-20 ENCOUNTER — Other Ambulatory Visit: Payer: Self-pay | Admitting: Family Medicine

## 2023-01-20 ENCOUNTER — Ambulatory Visit (INDEPENDENT_AMBULATORY_CARE_PROVIDER_SITE_OTHER): Payer: Medicare Other

## 2023-01-20 VITALS — Wt 197.0 lb

## 2023-01-20 DIAGNOSIS — Z0000 Encounter for general adult medical examination without abnormal findings: Secondary | ICD-10-CM

## 2023-01-20 DIAGNOSIS — M79604 Pain in right leg: Secondary | ICD-10-CM

## 2023-01-20 NOTE — Progress Notes (Signed)
Subjective:   Toni Berger is a 77 y.o. female who presents for Medicare Annual (Subsequent) preventive examination.  Review of Systems    I connected with  BRISTEN BATCHELLER on 01/20/23 by a audio enabled telemedicine application and verified that I am speaking with the correct person using two identifiers.  Patient Location: Home  Provider Location: Home Office  I discussed the limitations of evaluation and management by telemedicine. The patient expressed understanding and agreed to proceed.  Cardiac Risk Factors include: hypertension;advanced age (>41men, >39 women);Other (see comment), Risk factor comments: Leg pain , Back pain     Objective:    Today's Vitals   01/20/23 1026  Weight: 197 lb (89.4 kg)   Body mass index is 29.09 kg/m.     01/20/2023   10:36 AM 01/06/2022   10:56 AM 12/31/2021    8:02 PM 11/11/2021    6:00 PM 11/11/2021   12:24 PM 03/30/2019   10:11 AM 03/28/2019    4:00 AM  Advanced Directives  Does Patient Have a Medical Advance Directive? No Yes Yes Yes No Yes Yes  Type of Special educational needs teacher of [place];Living will Healthcare Power of [employer] Power of [employer] Power of [employer] Power of Attorney  Does patient want to make changes to medical advance directive?  No - Patient declined No - Patient declined No - Patient declined   No - Patient declined  Copy of Healthcare Power of Attorney in Chart?  Yes - validated most recent copy scanned in chart (See row information) Yes - validated most recent copy scanned in chart (See row information)   Yes - validated most recent copy scanned in chart (See row information) Yes - validated most recent copy scanned in chart (See row information)  Would patient like information on creating a medical advance directive? Yes (MAU/Ambulatory/Procedural Areas - Information given)   No - Patient declined No - Patient declined    Pre-existing out of facility DNR order (yellow form  or pink MOST form)       Yellow form placed in chart (order not valid for inpatient use)    Current Medications (verified) Outpatient Encounter Medications as of 01/20/2023  Medication Sig   amLODipine (NORVASC) 5 MG tablet TAKE 1 TABLET(5 MG) BY MOUTH DAILY   clonazePAM (KLONOPIN) 0.5 MG tablet TAKE 2 TABLET BY MOUTH EVERY 8 TO 12 HOURS AS NEEDED. MAXIMUM 3 DOSES IN 24 HOURS   clopidogrel (PLAVIX) 75 MG tablet TAKE 1 TABLET(75 MG) BY MOUTH DAILY   cyanocobalamin 1000 MCG tablet Take 1,000 mcg by mouth every evening.   lisinopril (ZESTRIL) 40 MG tablet TAKE 1 TABLET(40 MG) BY MOUTH DAILY   pantoprazole (PROTONIX) 40 MG tablet TAKE 1 TABLET(40 MG) BY MOUTH TWICE DAILY BEFORE A MEAL   traMADol (ULTRAM) 50 MG tablet TAKE 1 TABLET BY MOUTH EVERY 8 HOURS AS NEEDED FOR SEVERE PAIN   No facility-administered encounter medications on file as of 01/20/2023.    Allergies (verified) Carbidopa-levodopa, Prednisone, Sulfonamide derivatives, Aquasonic 100 [ultrasound gel], Ciprofloxacin, Oxycodone, Potassium, Povidone-iodine, Propoxyphene hcl, Sertraline hcl, Tetracycline, Acetaminophen, Metoprolol, and Pravastatin   History: Past Medical History:  Diagnosis Date   Allergy    Anxiety    Arthritis    Closed rib fracture 01/09/2016   Fibrocystic breast    Hyperlipidemia    Hypertension    Lipoma    5/01 Abdominal wall lipoma   Loss of consciousness (HCC) 12/15/2015   Migraines  Osteoporosis, unspecified    Restless leg syndrome    Stroke (cerebrum) (HCC)    Upper GI bleed 03/27/2019   Venous stasis dermatitis 01/19/2020   Past Surgical History:  Procedure Laterality Date   ABDOMINAL HYSTERECTOMY     APPENDECTOMY  1999   BREAST CYST ASPIRATION     CHOLECYSTECTOMY  1999   DOBUTAMINE STRESS ECHO  06/96   negative   ESOPHAGOGASTRODUODENOSCOPY N/A 03/30/2019   Procedure: ESOPHAGOGASTRODUODENOSCOPY (EGD);  Surgeon: Toney Reil, MD;  Location: [facility] ENDOSCOPY;  Service: Gastroenterology;   Laterality: N/A;   GIVENS CAPSULE STUDY N/A 03/31/2019   Procedure: GIVENS CAPSULE STUDY;  Surgeon: Toney Reil, MD;  Location: [facility] ENDOSCOPY;  Service: Gastroenterology;  Laterality: N/A;   TONSILLECTOMY AND ADENOIDECTOMY  1974   TOTAL ABDOMINAL HYSTERECTOMY W/ BILATERAL SALPINGOOPHORECTOMY  1981   Family History  Problem Relation Age of Onset   Heart disease Mother    Diabetes Mother    Aneurysm Mother        AAA   Heart failure Mother    Heart failure Father    Coronary artery disease Father    Colon cancer Neg Hx    Breast cancer Neg Hx    Social History   Socioeconomic History   Marital status: Single    Spouse name: Not on file   Number of children: Not on file   Years of education: Not on file   Highest education level: Not on file  Occupational History   Occupation: retired Airline pilot for town of [employer]    Employer: RETIRED  Tobacco Use   Smoking status: Former   Smokeless tobacco: Never  Substance and Sexual Activity   Alcohol use: No   Drug use: No   Sexual activity: Not on file  Other Topics Concern   Not on file  Social History Narrative   Has Health Care POA-- husband and then stepson.   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   Social Determinants of Health   Financial Resource Strain: Low Risk  (01/20/2023)   Overall Financial Resource Strain (CARDIA)    Difficulty of Paying Living Expenses: Not hard at all  Food Insecurity: No Food Insecurity (01/20/2023)   Hunger Vital Sign    Worried About Running Out of Food in the Last Year: Never true    Ran Out of Food in the Last Year: Never true  Transportation Needs: No Transportation Needs (01/20/2023)   PRAPARE - Administrator, Civil Service (Medical): No    Lack of Transportation (Non-Medical): No  Physical Activity: Insufficiently Active (01/20/2023)   Exercise Vital Sign    Days of Exercise per Week: 7 days    Minutes of Exercise per Session:  20 min  Stress: No Stress Concern Present (01/20/2023)   [employer] of Occupational Health - Occupational Stress Questionnaire    Feeling of Stress : Not at all  Social Connections: Moderately Isolated (01/20/2023)   Social Connection and Isolation Panel [NHANES]    Frequency of Communication with Friends and Family: Once a week    Frequency of Social Gatherings with Friends and Family: Never    Attends Religious Services: More than 4 times per year    Active Member of [employer] or Organizations: No    Attends Banker Meetings: 1 to 4 times per year    Marital Status: Widowed    Tobacco Counseling Counseling given: Yes   Clinical Intake:  Pre-visit preparation  completed: Yes  Pain : No/denies pain     BMI - recorded: 29.09 Nutritional Risks: None Diabetes: No  How often do you need to have someone help you when you read instructions, pamphlets, or other written materials from your doctor or pharmacy?: 1 - Never  Diabetic?NO  Interpreter Needed?: No  Information entered by :: Fredirick Maudlin   Activities of Daily Living    01/19/2023    1:04 PM  In your present state of health, do you have any difficulty performing the following activities:  Hearing? 0  Vision? 0  Difficulty concentrating or making decisions? 0  Walking or climbing stairs? 1  Dressing or bathing? 0  Doing errands, shopping? 1  Preparing Food and eating ? N  Using the Toilet? N  In the past six months, have you accidently leaked urine? N  Do you have problems with loss of bowel control? N  Managing your Medications? N  Managing your Finances? N  Housekeeping or managing your Housekeeping? N    Patient Care Team: Glori Luis, MD as PCP - General (Family Medicine) [facility]  Indicate any recent Medical Services you may have received from other than [facility] providers in the past year (date may be approximate).     Assessment:   This is a  routine wellness examination for Jetty.  Hearing/Vision screen Hearing Screening - Comments:: Denies hearing difficulties   Vision Screening - Comments:: Wears rx glasses - up to date with routine eye exams with  [facility]   Dietary issues and exercise activities discussed: Current Exercise Habits: Structured exercise class, Type of exercise: walking, Frequency (Times/Week): 3, Exercise limited by: cardiac condition(s);orthopedic condition(s) (leg pain)   Goals Addressed             This Visit's Progress    Maintain Healthy Lifestyle   On track    Stay active Healthy diet     Patient Stated       Stay healthy       Depression Screen    01/20/2023   10:32 AM 10/28/2022    9:57 AM 07/28/2022    9:58 AM 07/10/2022   11:10 AM 04/24/2022    4:11 PM 01/06/2022   10:58 AM 10/21/2021    9:17 AM  PHQ 2/9 Scores  PHQ - 2 Score 0 1 0 0 0 0 0  PHQ- 9 Score  3         Fall Risk    01/19/2023    1:04 PM 10/28/2022    9:57 AM 07/28/2022    9:58 AM 07/10/2022   11:10 AM 04/24/2022    4:11 PM  Fall Risk   Falls in the past year? 0 0 0 0 0  Number falls in past yr: 0 0 0 0 0  Injury with Fall? 0 0 0 0 0  Risk for fall due to :  No Fall Risks No Fall Risks No Fall Risks No Fall Risks  Follow up Falls prevention discussed;Falls evaluation completed Falls evaluation completed Falls evaluation completed Falls evaluation completed Falls evaluation completed    FALL RISK PREVENTION PERTAINING TO THE HOME:  Any stairs in or around the home? No  If so, are there any without handrails? No  Home free of loose throw rugs in walkways, pet beds, electrical cords, etc? No  Adequate lighting in your home to reduce risk of falls? Yes   ASSISTIVE DEVICES UTILIZED TO PREVENT FALLS:  Life alert? No  Use of a cane, walker or w/c? Yes  Grab bars in the bathroom? Yes  Shower chair or bench in shower? Yes  Elevated toilet seat or a handicapped toilet? Yes   TIMED UP AND GO:  Was the  test performed? No .Televisit   Cognitive Function:        01/20/2023   10:33 AM  6CIT Screen  What Year? 0 points  What month? 0 points  What time? 0 points  Count back from 20 0 points  Months in reverse 0 points  Repeat phrase 0 points  Total Score 0 points    Immunizations Immunization History  Administered Date(s) Administered   Td 12/15/2006    TDAP status: Due, Education has been provided regarding the importance of this vaccine. Advised may receive this vaccine at local pharmacy or Health Dept. Aware to provide a copy of the vaccination record if obtained from local pharmacy or Health Dept. Verbalized acceptance and understanding.  Flu Vaccine status: Up to date  Pneumococcal vaccine status: Up to date  Covid-19 vaccine status: Information provided on how to obtain vaccines.   Qualifies for Shingles Vaccine? Yes   Zostavax completed No   Shingrix Completed?: No.    Education has been provided regarding the importance of this vaccine. Patient has been advised to call insurance company to determine out of pocket expense if they have not yet received this vaccine. Advised may also receive vaccine at local pharmacy or Health Dept. Verbalized acceptance and understanding.  Screening Tests Health Maintenance  Topic Date Due   DTaP/Tdap/Td (2 - Tdap) 12/14/2016   INFLUENZA VACCINE  04/15/2023   Medicare Annual Wellness (AWV)  01/20/2024   DEXA SCAN  Completed   HPV VACCINES  Aged Out   Pneumonia Vaccine 59+ Years old  Discontinued   COVID-19 Vaccine  Discontinued   Hepatitis C Screening  Discontinued   Zoster Vaccines- Shingrix  Discontinued    Health Maintenance  Health Maintenance Due  Topic Date Due   DTaP/Tdap/Td (2 - Tdap) 12/14/2016    Colorectal cancer screening: Type of screening: Colonoscopy. Completed PATIENT DECLINED . Repeat every 5-10 years  Mammogram status: Completed 02/14/20 PATIENT DECLINED . Repeat every year  Bone Density status: Completed  08/12/2016. Results reflect: Bone density results: OSTEOPENIA. Repeat every PATIENT DECLINED  years.  Lung Cancer Screening: (Low Dose CT Chest recommended if Age 50-80 years, 30 pack-year currently smoking OR have quit w/in 15years.) does not qualify.    Additional Screening:  Hepatitis C Screening: does qualify will discuss at next visit   Vision Screening: Recommended annual ophthalmology exams for early detection of glaucoma and other disorders of the eye. Is the patient up to date with their annual eye exam?  Yes  Who is the provider or what is the name of the office in which the patient attends annual eye exams? [facility]  If pt is not established with a provider, would they like to be referred to a provider to establish care? No .   Dental Screening: Recommended annual dental exams for proper oral hygiene  Community Resource Referral / Chronic Care Management: CRR required this visit?  No   CCM required this visit?  No      Plan:     I have personally reviewed and noted the following in the patient's chart:   Medical and social history Use of alcohol, tobacco or illicit drugs  Current medications and supplements including opioid prescriptions. Patient is not currently taking opioid  prescriptions. Functional ability and status Nutritional status Physical activity Advanced directives List of other physicians Hospitalizations, surgeries, and ER visits in previous 12 months Vitals Screenings to include cognitive, depression, and falls Referrals and appointments  In addition, I have reviewed and discussed with patient certain preventive protocols, quality metrics, and best practice recommendations. A written personalized care plan for preventive services as well as general preventive health recommendations were provided to patient.     Annabell Sabal, CMA   01/20/2023   Nurse Notes: None

## 2023-01-20 NOTE — Patient Instructions (Signed)
[name] , Thank you for taking time to come for your Medicare Wellness Visit. I appreciate your ongoing commitment to your health goals. Please review the following plan we discussed and let me know if I can assist you in the future.   These are the goals we discussed:  Goals      Maintain Healthy Lifestyle     Stay active Healthy diet     Patient Stated     Stay healthy         This is a list of the screening recommended for you and due dates:  Health Maintenance  Topic Date Due   DTaP/Tdap/Td vaccine (2 - Tdap) 12/14/2016   Flu Shot  04/15/2023   Medicare Annual Wellness Visit  01/20/2024   DEXA scan (bone density measurement)  Completed   HPV Vaccine  Aged Out   Pneumonia Vaccine  Discontinued   COVID-19 Vaccine  Discontinued   Hepatitis C Screening: USPSTF Recommendation to screen - Ages 55-79 yo.  Discontinued   Zoster (Shingles) Vaccine  Discontinued    Advanced directives: Advance directive discussed with you today. I have provided a copy for you to complete at home and have notarized. Once this is complete please bring a copy in to our office so we can scan it into your chart. Managing Pain Without Opioids Opioids are strong medicines used to treat moderate to severe pain. For some people, especially those who have long-term (chronic) pain, opioids may not be the best choice for pain management due to: Side effects like nausea, constipation, and sleepiness. The risk of addiction (opioid use disorder). The longer you take opioids, the greater your risk of addiction. Pain that lasts for more than 3 months is called chronic pain. Managing chronic pain usually requires more than one approach and is often provided by a team of health care providers working together (multidisciplinary approach). Pain management may be done at a pain management center or pain clinic. How to manage pain without the use of opioids Use non-opioid medicines Non-opioid medicines for pain may  include: Over-the-counter or prescription non-steroidal anti-inflammatory drugs (NSAIDs). These may be the first medicines used for pain. They work well for muscle and bone pain, and they reduce swelling. Acetaminophen. This over-the-counter medicine may work well for milder pain but not swelling. Antidepressants. These may be used to treat chronic pain. A certain type of antidepressant (tricyclics) is often used. These medicines are given in lower doses for pain than when used for depression. Anticonvulsants. These are usually used to treat seizures but may also reduce nerve (neuropathic) pain. Muscle relaxants. These relieve pain caused by sudden muscle tightening (spasms). You may also use a pain medicine that is applied to the skin as a patch, cream, or gel (topical analgesic), such as a numbing medicine. These may cause fewer side effects than medicines taken by mouth. Do certain therapies as directed Some therapies can help with pain management. They include: Physical therapy. You will do exercises to gain strength and flexibility. A physical therapist may teach you exercises to move and stretch parts of your body that are weak, stiff, or painful. You can learn these exercises at physical therapy visits and practice them at home. Physical therapy may also involve: Massage. Heat wraps or applying heat or cold to affected areas. Electrical signals that interrupt pain signals (transcutaneous electrical nerve stimulation, TENS). Weak lasers that reduce pain and swelling (low-level laser therapy). Signals from your body that help you learn to regulate pain (  biofeedback). Occupational therapy. This helps you to learn ways to function at home and work with less pain. Recreational therapy. This involves trying new activities or hobbies, such as a physical activity or drawing. Mental health therapy, including: Cognitive behavioral therapy (CBT). This helps you learn coping skills for dealing with  pain. Acceptance and commitment therapy (ACT) to change the way you think and react to pain. Relaxation therapies, including muscle relaxation exercises and mindfulness-based stress reduction. Pain management counseling. This may be individual, family, or group counseling.  Receive medical treatments Medical treatments for pain management include: Nerve block injections. These may include a pain blocker and anti-inflammatory medicines. You may have injections: Near the spine to relieve chronic back or neck pain. Into joints to relieve back or joint pain. Into nerve areas that supply a painful area to relieve body pain. Into muscles (trigger point injections) to relieve some painful muscle conditions. A medical device placed near your spine to help block pain signals and relieve nerve pain or chronic back pain (spinal cord stimulation device). Acupuncture. Follow these instructions at home Medicines Take over-the-counter and prescription medicines only as told by your health care provider. If you are taking pain medicine, ask your health care providers about possible side effects to watch out for. Do not drive or use heavy machinery while taking prescription opioid pain medicine. Lifestyle  Do not use drugs or alcohol to reduce pain. If you drink alcohol, limit how much you have to: 0-1 drink a day for women who are not pregnant. 0-2 drinks a day for men. Know how much alcohol is in a drink. In the [place] one drink equals one 12 oz bottle of beer (355 mL), one 5 oz glass of wine (148 mL), or one 1 oz glass of hard liquor (44 mL). Do not use any products that contain nicotine or tobacco. These products include cigarettes, chewing tobacco, and vaping devices, such as e-cigarettes. If you need help quitting, ask your health care provider. Eat a healthy diet and maintain a healthy weight. Poor diet and excess weight may make pain worse. Eat foods that are high in fiber. These include fresh  fruits and vegetables, whole grains, and beans. Limit foods that are high in fat and processed sugars, such as fried and sweet foods. Exercise regularly. Exercise lowers stress and may help relieve pain. Ask your health care provider what activities and exercises are safe for you. If your health care provider approves, join an exercise class that combines movement and stress reduction. Examples include yoga and tai chi. Get enough sleep. Lack of sleep may make pain worse. Lower stress as much as possible. Practice stress reduction techniques as told by your therapist. General instructions Work with all your pain management providers to find the treatments that work best for you. You are an important member of your pain management team. There are many things you can do to reduce pain on your own. Consider joining an online or in-person support group for people who have chronic pain. Keep all follow-up visits. This is important. Where to find more information You can find more information about managing pain without opioids from: American Academy of Pain Medicine: painmed.org Institute for Chronic Pain: instituteforchronicpain.org American Chronic Pain Association: theacpa.org Contact a health care provider if: You have side effects from pain medicine. Your pain gets worse or does not get better with treatments or home therapy. You are struggling with anxiety or depression. Summary Many types of pain can be managed without  opioids. Chronic pain may respond better to pain management without opioids. Pain is best managed when you and a team of health care providers work together. Pain management without opioids may include non-opioid medicines, medical treatments, physical therapy, mental health therapy, and lifestyle changes. Tell your health care providers if your pain gets worse or is not being managed well enough. This information is not intended to replace advice given to you by your health  care provider. Make sure you discuss any questions you have with your health care provider. Document Revised: 12/11/2020 Document Reviewed: 12/11/2020 Elsevier Patient Education  2023 Elsevier Inc.   Conditions/risks identified: Aim for 30 minutes of exercise or brisk walking, 6-8 glasses of water, and 5 servings of fruits and vegetables each day.   Next appointment: Follow up in one year for your annual wellness visit    Preventive Care 65 Years and Older, Female Preventive care refers to lifestyle choices and visits with your health care provider that can promote health and wellness. What does preventive care include? A yearly physical exam. This is also called an annual well check. Dental exams once or twice a year. Routine eye exams. Ask your health care provider how often you should have your eyes checked. Personal lifestyle choices, including: Daily care of your teeth and gums. Regular physical activity. Eating a healthy diet. Avoiding tobacco and drug use. Limiting alcohol use. Practicing safe sex. Taking low-dose aspirin every day. Taking vitamin and mineral supplements as recommended by your health care provider. What happens during an annual well check? The services and screenings done by your health care provider during your annual well check will depend on your age, overall health, lifestyle risk factors, and family history of disease. Counseling  Your health care provider may ask you questions about your: Alcohol use. Tobacco use. Drug use. Emotional well-being. Home and relationship well-being. Sexual activity. Eating habits. History of falls. Memory and ability to understand (cognition). Work and work Astronomer. Reproductive health. Screening  You may have the following tests or measurements: Height, weight, and BMI. Blood pressure. Lipid and cholesterol levels. These may be checked every 5 years, or more frequently if you are over 57 years old. Skin  check. Lung cancer screening. You may have this screening every year starting at age 38 if you have a 30-pack-year history of smoking and currently smoke or have quit within the past 15 years. Fecal occult blood test (FOBT) of the stool. You may have this test every year starting at age 83. Flexible sigmoidoscopy or colonoscopy. You may have a sigmoidoscopy every 5 years or a colonoscopy every 10 years starting at age 76. Hepatitis C blood test. Hepatitis B blood test. Sexually transmitted disease (STD) testing. Diabetes screening. This is done by checking your blood sugar (glucose) after you have not eaten for a while (fasting). You may have this done every 1-3 years. Bone density scan. This is done to screen for osteoporosis. You may have this done starting at age 3. Mammogram. This may be done every 1-2 years. Talk to your health care provider about how often you should have regular mammograms. Talk with your health care provider about your test results, treatment options, and if necessary, the need for more tests. Vaccines  Your health care provider may recommend certain vaccines, such as: Influenza vaccine. This is recommended every year. Tetanus, diphtheria, and acellular pertussis (Tdap, Td) vaccine. You may need a Td booster every 10 years. Zoster vaccine. You may need this after age 10.  Pneumococcal 13-valent conjugate (PCV13) vaccine. One dose is recommended after age 58. Pneumococcal polysaccharide (PPSV23) vaccine. One dose is recommended after age 62. Talk to your health care provider about which screenings and vaccines you need and how often you need them. This information is not intended to replace advice given to you by your health care provider. Make sure you discuss any questions you have with your health care provider. Document Released: 09/27/2015 Document Revised: 05/20/2016 Document Reviewed: 07/02/2015 Elsevier Interactive Patient Education  2017 [employer].  Fall  Prevention in the Home Falls can cause injuries. They can happen to people of all ages. There are many things you can do to make your home safe and to help prevent falls. What can I do on the outside of my home? Regularly fix the edges of walkways and driveways and fix any cracks. Remove anything that might make you trip as you walk through a door, such as a raised step or threshold. Trim any bushes or trees on the path to your home. Use bright outdoor lighting. Clear any walking paths of anything that might make someone trip, such as rocks or tools. Regularly check to see if handrails are loose or broken. Make sure that both sides of any steps have handrails. Any raised decks and porches should have guardrails on the edges. Have any leaves, snow, or ice cleared regularly. Use sand or salt on walking paths during winter. Clean up any spills in your garage right away. This includes oil or grease spills. What can I do in the bathroom? Use night lights. Install grab bars by the toilet and in the tub and shower. Do not use towel bars as grab bars. Use non-skid mats or decals in the tub or shower. If you need to sit down in the shower, use a plastic, non-slip stool. Keep the floor dry. Clean up any water that spills on the floor as soon as it happens. Remove soap buildup in the tub or shower regularly. Attach bath mats securely with double-sided non-slip rug tape. Do not have throw rugs and other things on the floor that can make you trip. What can I do in the bedroom? Use night lights. Make sure that you have a light by your bed that is easy to reach. Do not use any sheets or blankets that are too big for your bed. They should not hang down onto the floor. Have a firm chair that has side arms. You can use this for support while you get dressed. Do not have throw rugs and other things on the floor that can make you trip. What can I do in the kitchen? Clean up any spills right away. Avoid  walking on wet floors. Keep items that you use a lot in easy-to-reach places. If you need to reach something above you, use a strong step stool that has a grab bar. Keep electrical cords out of the way. Do not use floor polish or wax that makes floors slippery. If you must use wax, use non-skid floor wax. Do not have throw rugs and other things on the floor that can make you trip. What can I do with my stairs? Do not leave any items on the stairs. Make sure that there are handrails on both sides of the stairs and use them. Fix handrails that are broken or loose. Make sure that handrails are as long as the stairways. Check any carpeting to make sure that it is firmly attached to the stairs. Fix any  carpet that is loose or worn. Avoid having throw rugs at the top or bottom of the stairs. If you do have throw rugs, attach them to the floor with carpet tape. Make sure that you have a light switch at the top of the stairs and the bottom of the stairs. If you do not have them, ask someone to add them for you. What else can I do to help prevent falls? Wear shoes that: Do not have high heels. Have rubber bottoms. Are comfortable and fit you well. Are closed at the toe. Do not wear sandals. If you use a stepladder: Make sure that it is fully opened. Do not climb a closed stepladder. Make sure that both sides of the stepladder are locked into place. Ask someone to hold it for you, if possible. Clearly mark and make sure that you can see: Any grab bars or handrails. First and last steps. Where the edge of each step is. Use tools that help you move around (mobility aids) if they are needed. These include: Canes. Walkers. Scooters. Crutches. Turn on the lights when you go into a dark area. Replace any light bulbs as soon as they burn out. Set up your furniture so you have a clear path. Avoid moving your furniture around. If any of your floors are uneven, fix them. If there are any pets around  you, be aware of where they are. Review your medicines with your doctor. Some medicines can make you feel dizzy. This can increase your chance of falling. Ask your doctor what other things that you can do to help prevent falls. This information is not intended to replace advice given to you by your health care provider. Make sure you discuss any questions you have with your health care provider. Document Released: 06/27/2009 Document Revised: 02/06/2016 Document Reviewed: 10/05/2014 Elsevier Interactive Patient Education  2017 [employer].

## 2023-01-22 ENCOUNTER — Encounter: Payer: Self-pay | Admitting: Family Medicine

## 2023-01-27 ENCOUNTER — Ambulatory Visit: Payer: Medicare Other | Admitting: Family Medicine

## 2023-01-28 ENCOUNTER — Telehealth: Payer: Self-pay | Admitting: Urgent Care

## 2023-01-28 DIAGNOSIS — F419 Anxiety disorder, unspecified: Secondary | ICD-10-CM

## 2023-01-28 DIAGNOSIS — K219 Gastro-esophageal reflux disease without esophagitis: Secondary | ICD-10-CM

## 2023-01-28 MED ORDER — PANTOPRAZOLE SODIUM 40 MG PO TBEC
40.0000 mg | DELAYED_RELEASE_TABLET | Freq: Two times a day (BID) | ORAL | 1 refills | Status: DC
Start: 2023-01-28 — End: 2023-08-24

## 2023-01-28 MED ORDER — AMLODIPINE BESYLATE 5 MG PO TABS: ORAL_TABLET | ORAL | 0 refills | Status: DC

## 2023-01-28 MED ORDER — CLONAZEPAM 0.5 MG PO TABS: ORAL_TABLET | ORAL | 0 refills | Status: DC

## 2023-01-28 NOTE — Telephone Encounter (Signed)
Pt called in requesting refill of klonopin, pantoprazole, norvasc, and tramadol. Klonopin was due for refill, last filled 12/27/22. Tramadol was last filled on 01/21/23, a 30 day supply and thus cannot be refilled again until 02/21/23. Norvasc and pantoprazole filled. Pt has follow up scheduled.

## 2023-02-17 ENCOUNTER — Other Ambulatory Visit: Payer: Self-pay | Admitting: Family Medicine

## 2023-02-17 DIAGNOSIS — M79604 Pain in right leg: Secondary | ICD-10-CM

## 2023-02-25 ENCOUNTER — Other Ambulatory Visit: Payer: Self-pay | Admitting: Family Medicine

## 2023-02-25 DIAGNOSIS — F419 Anxiety disorder, unspecified: Secondary | ICD-10-CM

## 2023-02-25 NOTE — Telephone Encounter (Signed)
Requesting: Clonazeopam 0.5 mg Contract: No UDS: no Last Visit: 10/28/2022 Next Visit: 03/08/2023 Last Refill: 02/05/23  Please Advise

## 2023-03-04 ENCOUNTER — Ambulatory Visit (INDEPENDENT_AMBULATORY_CARE_PROVIDER_SITE_OTHER): Payer: Medicare Other | Admitting: Podiatry

## 2023-03-04 VITALS — BP 154/78

## 2023-03-04 DIAGNOSIS — L84 Corns and callosities: Secondary | ICD-10-CM

## 2023-03-04 DIAGNOSIS — B351 Tinea unguium: Secondary | ICD-10-CM

## 2023-03-04 DIAGNOSIS — M79609 Pain in unspecified limb: Secondary | ICD-10-CM

## 2023-03-04 DIAGNOSIS — D689 Coagulation defect, unspecified: Secondary | ICD-10-CM

## 2023-03-04 DIAGNOSIS — Q828 Other specified congenital malformations of skin: Secondary | ICD-10-CM

## 2023-03-04 NOTE — Progress Notes (Signed)
Subjective:  Patient ID: Toni Berger, female    DOB: 1946/08/17,  MRN: [id number]  Toni Berger presents to clinic today for: at risk foot care with h/o neuropathy, coagulation defect and corn(s) R 4th toe, callus(es) bilateral heels and left foot and painful mycotic nails.  Pain interferes with ambulation. Aggravating factors include wearing enclosed shoe gear. Painful toenails interfere with ambulation. Aggravating factors include wearing enclosed shoe gear. Pain is relieved with periodic professional debridement. Painful corns and calluses are aggravated when weightbearing with and without shoegear. Pain is relieved with periodic professional debridement.   Patient states she lost her husband several years ago and she still misses him. States she has no family and she doesn't hear from friends anymore.  She informs me she phoned Medicare and she was advised she could be seen every 30 days. Chief Complaint  Patient presents with   Nail Problem    RFC,Referring Provider Glori Luis, MD   Lov:2/24    PCP is Glori Luis, MD.  Allergies  Allergen Reactions   Carbidopa-Levodopa Other (See Comments)    REACTION: headache, stomach pain, chest pain and seeing purple colors   Prednisone     Throat swelling    Sulfonamide Derivatives     "stop breathing, eyes roll in back of head and fingers web"   Aquasonic 100 [Ultrasound Gel] Itching   Ciprofloxacin    Oxycodone Diarrhea   Potassium Diarrhea   Povidone-Iodine    Propoxyphene Hcl    Sertraline Hcl     headache   Tetracycline    Acetaminophen Diarrhea    Stomach pain   Metoprolol Other (See Comments)    To low heart rate/ BP   Pravastatin Other (See Comments)    Muscle cramps    Review of Systems: Negative except as noted in the HPI.  Objective: No changes noted in today's physical examination. Vitals:   03/04/23 1057  BP: (!) 154/78  Patient refuses repeat of blood pressure stating she will see PCP on  Monday. She is asymptomatic today.  Toni Berger is a pleasant 77 y.o. female in NAD. AAO x 3.  Vascular Examination: Capillary refill time <3 seconds b/l LE. Faintly palpable pedal pulses b/l LE. Digital hair diminished b/l. No pedal edema b/l. Skin temperature gradient WNL b/l. No varicosities b/l.  Dermatological Examination: Pedal skin thin, shiny and atrophic b/l. No open wounds. No interdigital macerations b/l. Toenails b/l great toes thickened, discolored, dystrophic with subungual debris. There is pain on palpation to dorsal aspect of nailplates. Nondystrophic toenails 2-5 bilaterally. Hyperkeratotic lesion(s) bilateral heels.  No erythema, no edema, no drainage, no fluctuance. Porokeratotic lesion(s) R 4th toe and submet head 2 left foot. No erythema, no edema, no drainage, no fluctuance.  Neurological Examination: Pt has subjective symptoms of neuropathy. Protective sensation intact with 10 gram monofilament b/l LE. Vibratory sensation intact b/l LE.   Musculoskeletal Examination: Muscle strength 5/5 to all lower extremity muscle groups bilaterally. Hammertoe(s) noted to the R 4th toe and R 5th toe.  Assessment/Plan: 1. Pain due to onychomycosis of nail   2. Porokeratosis   3. Callus   4. Coagulation disorder (HCC)     -Advised patient most routine foot care is eligible every 61 days and we don't want her to end up with a bill due to frequency of visits. Our Engineer, manufacturing, Pleas Patricia, also spoke with the patient. -Patient to continue soft, supportive shoe gear daily. -Toenails 2-5 bilaterally were  trimmed  with sterile nail nippers and dremel. Pinpoint bleeding of R 5th toe addressed with Lumicain Hemostatic Solution, cleansed with alcohol. triple antibiotic ointment applied. Patient/careigver instructed to apply Neosporin Cream once daily for 3 days. -Mycotic toenails bilateral great toes were debrided in length and girth with sterile nail nippers and dremel without  iatrogenic bleeding. -Patient/POA to call should there be question/concern in the interim.   Return in about 9 weeks (around 05/06/2023).  Freddie Breech, DPM

## 2023-03-07 ENCOUNTER — Encounter: Payer: Self-pay | Admitting: Podiatry

## 2023-03-08 ENCOUNTER — Encounter: Payer: Self-pay | Admitting: Family Medicine

## 2023-03-08 ENCOUNTER — Ambulatory Visit (INDEPENDENT_AMBULATORY_CARE_PROVIDER_SITE_OTHER): Payer: Medicare Other | Admitting: Family Medicine

## 2023-03-08 VITALS — BP 134/84 | HR 60 | Temp 97.8°F | Ht 69.0 in | Wt 195.2 lb

## 2023-03-08 DIAGNOSIS — I10 Essential (primary) hypertension: Secondary | ICD-10-CM | POA: Diagnosis not present

## 2023-03-08 DIAGNOSIS — I872 Venous insufficiency (chronic) (peripheral): Secondary | ICD-10-CM | POA: Diagnosis not present

## 2023-03-08 DIAGNOSIS — M79604 Pain in right leg: Secondary | ICD-10-CM | POA: Diagnosis not present

## 2023-03-08 DIAGNOSIS — F419 Anxiety disorder, unspecified: Secondary | ICD-10-CM

## 2023-03-08 DIAGNOSIS — R0781 Pleurodynia: Secondary | ICD-10-CM

## 2023-03-08 DIAGNOSIS — M79605 Pain in left leg: Secondary | ICD-10-CM | POA: Diagnosis not present

## 2023-03-08 MED ORDER — AMLODIPINE BESYLATE 5 MG PO TABS: ORAL_TABLET | ORAL | 2 refills | Status: DC

## 2023-03-08 NOTE — Assessment & Plan Note (Signed)
Patient has hemosiderin deposition changes in her legs related to venous insufficiency.  Discussed elevation of her legs if possible.  She notes compression stockings were not beneficial previously due to limitations caused by compression stockings.

## 2023-03-08 NOTE — Assessment & Plan Note (Signed)
Chronic issue.  Stable.  She can continue tramadol 50 mg every 8 hours as needed for pain. 

## 2023-03-08 NOTE — Progress Notes (Signed)
[name], MD Phone: [phone]  Toni Berger is a 77 y.o. female who presents today for follow-up.  Anxiety: Patient notes this is doing well.  No depression.  Clonazepam is working adequately.  No drowsiness with this.  Chronic pain: Patient's tramadol helps with her ability to walk.  Notes chronic hip pain.  Notes trouble sleeping on her left side related to prior rib fractures.  No drowsiness with the tramadol.  Hypertension: Not checking at home.  She is taking amlodipine and lisinopril.  No chest pain or shortness of breath.  No change to chronic edema which is likely related to venous insufficiency.  Social History   Tobacco Use  Smoking Status Former  Smokeless Tobacco Never    Current Outpatient Medications on File Prior to Visit  Medication Sig Dispense Refill   clonazePAM (KLONOPIN) 0.5 MG tablet TAKE 2 TABLETS BY MOUTH EVERY 8 TO 12 HOURS AS NEEDED. MAXIMUM 3 DOSES IN 24 HOURS 180 tablet 0   clopidogrel (PLAVIX) 75 MG tablet TAKE 1 TABLET(75 MG) BY MOUTH DAILY 90 tablet 3   cyanocobalamin 1000 MCG tablet Take 1,000 mcg by mouth every evening.     lisinopril (ZESTRIL) 40 MG tablet TAKE 1 TABLET(40 MG) BY MOUTH DAILY 90 tablet 3   pantoprazole (PROTONIX) 40 MG tablet Take 1 tablet (40 mg total) by mouth 2 (two) times daily. 180 tablet 1   traMADol (ULTRAM) 50 MG tablet TAKE 1 TABLET BY MOUTH EVERY 8 HOURS AS NEEDED FOR SEVERE PAIN 90 tablet 0   No current facility-administered medications on file prior to visit.     ROS see history of present illness  Objective  Physical Exam Vitals:   03/08/23 1214  BP: 134/84  Pulse: 60  Temp: 97.8 F (36.6 C)  SpO2: 97%    BP Readings from Last 3 Encounters:  03/08/23 134/84  03/04/23 (!) 154/78  10/28/22 120/72   Wt Readings from Last 3 Encounters:  03/08/23 195 lb 3.2 oz (88.5 kg)  01/20/23 197 lb (89.4 kg)  10/28/22 197 lb 6 oz (89.5 kg)    Physical Exam Constitutional:      General: She is not in  acute distress.    Appearance: She is not diaphoretic.  Cardiovascular:     Rate and Rhythm: Normal rate and regular rhythm.     Heart sounds: Normal heart sounds.  Pulmonary:     Effort: Pulmonary effort is normal.     Breath sounds: Normal breath sounds.  Musculoskeletal:     Comments: Trace pitting edema bilateral lower extremities with hemosiderin deposition changes  Skin:    General: Skin is warm and dry.  Neurological:     Mental Status: She is alert.      Assessment/Plan: Please see individual problem list.  HYPERTENSION, BENIGN Assessment & Plan: Chronic issue.  Adequately controlled for age.  She will continue amlodipine 5 mg daily and lisinopril 40 mg daily.  Plan for lab work at next visit.  Discussed we could potentially stop the amlodipine to see if that is contributing to her swelling.   Venous insufficiency of both lower extremities Assessment & Plan: Patient has hemosiderin deposition changes in her legs related to venous insufficiency.  Discussed elevation of her legs if possible.  She notes compression stockings were not beneficial previously due to limitations caused by compression stockings.   Anxiety Assessment & Plan: Chronic issue.  Adequately controlled.  She can continue clonazepam 1 tablet by mouth every 8-12 hours as needed.  Rib pain on left side Assessment & Plan: Chronic issue related to prior rib fractures.  Discussed use of Salonpas over-the-counter to see if that would be beneficial.   Bilateral leg pain Assessment & Plan: Chronic issue.  Stable.  She can continue tramadol 50 mg every 8 hours as needed for pain.   Other orders -     amLODIPine Besylate; TAKE 1 TABLET(5 MG) BY MOUTH DAILY  Dispense: 90 tablet; Refill: 2    Return in about 3 months (around 06/08/2023).   [name], MD [facility]

## 2023-03-08 NOTE — Assessment & Plan Note (Signed)
Chronic issue.  Adequately controlled.  She can continue clonazepam 1 tablet by mouth every 8-12 hours as needed.

## 2023-03-08 NOTE — Assessment & Plan Note (Signed)
Chronic issue.  Adequately controlled for age.  She will continue amlodipine 5 mg daily and lisinopril 40 mg daily.  Plan for lab work at next visit.  Discussed we could potentially stop the amlodipine to see if that is contributing to her swelling.

## 2023-03-08 NOTE — Assessment & Plan Note (Signed)
Chronic issue related to prior rib fractures.  Discussed use of Salonpas over-the-counter to see if that would be beneficial.

## 2023-03-16 ENCOUNTER — Other Ambulatory Visit: Payer: Self-pay | Admitting: Family Medicine

## 2023-03-16 DIAGNOSIS — M79604 Pain in right leg: Secondary | ICD-10-CM

## 2023-03-20 ENCOUNTER — Other Ambulatory Visit: Payer: Self-pay | Admitting: Family

## 2023-03-20 DIAGNOSIS — F419 Anxiety disorder, unspecified: Secondary | ICD-10-CM

## 2023-03-24 ENCOUNTER — Other Ambulatory Visit: Payer: Self-pay

## 2023-03-24 DIAGNOSIS — F419 Anxiety disorder, unspecified: Secondary | ICD-10-CM

## 2023-03-24 NOTE — Telephone Encounter (Signed)
Prescription Request  03/24/2023  LOV: Visit date not found  What is the name of the medication or equipment? clonazePAM (KLONOPIN) 0.5 MG tablet  Have you contacted your pharmacy to request a refill? Yes   Which pharmacy would you like this sent to?  [id number] [place], [place] - [address] AT [facility] [address] [place] [address] Phone: [phone] Fax: [phone]    Patient notified that their request is being sent to the clinical staff for review and that they should receive a response within 2 business days.   Please advise at Mobile [phone] (mobile)  Patient states the prescription should be for 180 tablets since the dose has changed. Patient states she has another prescription to pick up but she would like to get them both at the same time.

## 2023-03-25 MED ORDER — CLONAZEPAM 0.5 MG PO TABS: ORAL_TABLET | ORAL | 0 refills | Status: DC

## 2023-03-25 NOTE — Telephone Encounter (Signed)
LOV: 03/08/23  NOV: 06/08/23  Med pended for approval

## 2023-03-28 ENCOUNTER — Other Ambulatory Visit: Payer: Self-pay | Admitting: Family Medicine

## 2023-04-02 ENCOUNTER — Ambulatory Visit: Payer: Medicare Other | Admitting: Podiatry

## 2023-04-19 ENCOUNTER — Encounter: Payer: Self-pay | Admitting: Family Medicine

## 2023-04-21 ENCOUNTER — Other Ambulatory Visit: Payer: Self-pay | Admitting: Family Medicine

## 2023-04-21 DIAGNOSIS — F419 Anxiety disorder, unspecified: Secondary | ICD-10-CM

## 2023-04-21 DIAGNOSIS — M79604 Pain in right leg: Secondary | ICD-10-CM

## 2023-04-22 NOTE — Telephone Encounter (Signed)
LOV 03/08/2023 NOV 06/08/2023

## 2023-04-27 NOTE — Telephone Encounter (Signed)
Noted  

## 2023-04-29 ENCOUNTER — Encounter (INDEPENDENT_AMBULATORY_CARE_PROVIDER_SITE_OTHER): Payer: Self-pay

## 2023-05-13 ENCOUNTER — Ambulatory Visit: Payer: Medicare Other | Admitting: Podiatry

## 2023-05-13 ENCOUNTER — Encounter: Payer: Self-pay | Admitting: Podiatry

## 2023-05-13 DIAGNOSIS — M79609 Pain in unspecified limb: Secondary | ICD-10-CM

## 2023-05-13 DIAGNOSIS — L84 Corns and callosities: Secondary | ICD-10-CM | POA: Diagnosis not present

## 2023-05-13 DIAGNOSIS — Q828 Other specified congenital malformations of skin: Secondary | ICD-10-CM | POA: Diagnosis not present

## 2023-05-13 DIAGNOSIS — D689 Coagulation defect, unspecified: Secondary | ICD-10-CM | POA: Diagnosis not present

## 2023-05-13 DIAGNOSIS — G629 Polyneuropathy, unspecified: Secondary | ICD-10-CM | POA: Diagnosis not present

## 2023-05-13 DIAGNOSIS — B351 Tinea unguium: Secondary | ICD-10-CM | POA: Diagnosis not present

## 2023-05-16 ENCOUNTER — Other Ambulatory Visit: Payer: Self-pay | Admitting: Family Medicine

## 2023-05-16 DIAGNOSIS — M79604 Pain in right leg: Secondary | ICD-10-CM

## 2023-05-16 NOTE — Progress Notes (Signed)
Subjective:  Patient ID: Toni Berger, female    DOB: November 03, 1945,  MRN: [id number]  Toni Berger presents to clinic today for at risk foot care with history of coagulation defect and peripheral neuropathy. She is seen for callus(es) bilateral heels, porokeratotic lesion(s) of both feet, and painful mycotic nails. Painful toenails interfere with ambulation. Aggravating factors include wearing enclosed shoe gear. Pain is relieved with periodic professional debridement. Painful callus(es) and porokeratotic lesion(s) are aggravated when weightbearing with and without shoegear. Pain is relieved with periodic professional debridement.  Chief Complaint  Patient presents with   Nail Problem    RFC,Referring Provider Glori Luis, MD,lov:06/24      Callouses    B/L feet   New problem(s): None.   PCP is Glori Luis, MD.  Allergies  Allergen Reactions   Carbidopa-Levodopa Other (See Comments)    REACTION: headache, stomach pain, chest pain and seeing purple colors   Prednisone     Throat swelling    Sulfonamide Derivatives     "stop breathing, eyes roll in back of head and fingers web"   Aquasonic 100 [Ultrasound Gel] Itching   Ciprofloxacin    Oxycodone Diarrhea   Potassium Diarrhea   Povidone-Iodine    Propoxyphene Hcl    Sertraline Hcl     headache   Tetracycline    Acetaminophen Diarrhea    Stomach pain   Metoprolol Other (See Comments)    To low heart rate/ BP   Pravastatin Other (See Comments)    Muscle cramps    Review of Systems: Negative except as noted in the HPI.  Objective: No changes noted in today's physical examination. There were no vitals filed for this visit. Toni Berger is a pleasant 77 y.o. female WD, WN in NAD. AAO x 3.  Vascular Examination: Capillary refill time <3 seconds b/l LE. Faintly palpable pedal pulses b/l LE. Digital hair diminished b/l. No pedal edema b/l. Skin temperature gradient WNL b/l. No varicosities  b/l.  Dermatological Examination: Pedal skin thin, shiny and atrophic b/l. No open wounds. No interdigital macerations b/l. Toenails b/l great toes thickened, discolored, dystrophic with subungual debris. There is pain on palpation to dorsal aspect of nailplates. Nondystrophic toenails 2-5 bilaterally.   Hyperkeratotic lesion(s) bilateral heels.  No erythema, no edema, no drainage, no fluctuance.   Porokeratotic lesion(s) R 4th toe and submet head 2 left foot. No erythema, no edema, no drainage, no fluctuance.  Neurological Examination: Pt has subjective symptoms of neuropathy. Protective sensation intact with 10 gram monofilament b/l LE. Vibratory sensation intact b/l LE.   Musculoskeletal Examination: Muscle strength 5/5 to all lower extremity muscle groups bilaterally. Hammertoe(s) noted to the R 4th toe and R 5th toe.  Assessment/Plan: 1. Pain due to onychomycosis of nail   2. Porokeratosis   3. Callus   4. Neuropathy   5. Coagulation disorder (HCC)    -Consent given for treatment as described below: -Examined patient. -Continue supportive shoe gear daily. -Toenails were debrided in length and girth bilateral great toes with sterile nail nippers and dremel without iatrogenic bleeding.  -Nondystrophic toenails trimmed 2-5 bilaterally. -Callus(es) bilateral heels pared utilizing rotary bur without complication or incident. Total number pared =2. -Porokeratotic lesion(s) R 4th toe and submet head 2 left foot pared and enucleated with sterile currette without incident. Total number of lesions debrided=2. -Patient/POA to call should there be question/concern in the interim.   Return in about 9 weeks (around 07/15/2023).  Freddie Breech, DPM

## 2023-05-18 NOTE — Telephone Encounter (Signed)
LOV: 03/08/23  NOV: 06/08/23

## 2023-05-20 DIAGNOSIS — H5203 Hypermetropia, bilateral: Secondary | ICD-10-CM | POA: Diagnosis not present

## 2023-05-20 DIAGNOSIS — H52203 Unspecified astigmatism, bilateral: Secondary | ICD-10-CM | POA: Diagnosis not present

## 2023-05-20 DIAGNOSIS — H2513 Age-related nuclear cataract, bilateral: Secondary | ICD-10-CM | POA: Diagnosis not present

## 2023-05-20 DIAGNOSIS — H40003 Preglaucoma, unspecified, bilateral: Secondary | ICD-10-CM | POA: Diagnosis not present

## 2023-06-07 ENCOUNTER — Encounter: Payer: Self-pay | Admitting: Family Medicine

## 2023-06-07 ENCOUNTER — Other Ambulatory Visit: Payer: Self-pay | Admitting: Family Medicine

## 2023-06-07 ENCOUNTER — Ambulatory Visit (INDEPENDENT_AMBULATORY_CARE_PROVIDER_SITE_OTHER): Payer: Medicare Other | Admitting: Family Medicine

## 2023-06-07 VITALS — BP 122/78 | HR 65 | Temp 97.8°F | Ht 69.0 in | Wt 198.6 lb

## 2023-06-07 DIAGNOSIS — F419 Anxiety disorder, unspecified: Secondary | ICD-10-CM

## 2023-06-07 DIAGNOSIS — F32A Depression, unspecified: Secondary | ICD-10-CM

## 2023-06-07 DIAGNOSIS — I10 Essential (primary) hypertension: Secondary | ICD-10-CM

## 2023-06-07 DIAGNOSIS — M79604 Pain in right leg: Secondary | ICD-10-CM

## 2023-06-07 DIAGNOSIS — M79605 Pain in left leg: Secondary | ICD-10-CM | POA: Diagnosis not present

## 2023-06-07 LAB — COMPREHENSIVE METABOLIC PANEL
ALT: 11 U/L (ref 0–35)
AST: 15 U/L (ref 0–37)
Albumin: 4.1 g/dL (ref 3.5–5.2)
Alkaline Phosphatase: 129 U/L — ABNORMAL HIGH (ref 39–117)
BUN: 33 mg/dL — ABNORMAL HIGH (ref 6–23)
CO2: 27 mEq/L (ref 19–32)
Calcium: 9.7 mg/dL (ref 8.4–10.5)
Chloride: 104 mEq/L (ref 96–112)
Creatinine, Ser: 1.1 mg/dL (ref 0.40–1.20)
GFR: 48.53 mL/min — ABNORMAL LOW (ref >60.00)
Glucose, Bld: 77 mg/dL (ref 70–99)
Potassium: 4.7 mEq/L (ref 3.5–5.1)
Sodium: 139 mEq/L (ref 135–145)
Total Bilirubin: 0.4 mg/dL (ref 0.2–1.2)
Total Protein: 7.4 g/dL (ref 6.0–8.3)

## 2023-06-07 LAB — LIPID PANEL
Cholesterol: 227 mg/dL — ABNORMAL HIGH (ref 0–200)
HDL: 87.3 mg/dL
LDL Cholesterol: 114 mg/dL — ABNORMAL HIGH (ref 0–99)
NonHDL: 139.87
Total CHOL/HDL Ratio: 3
Triglycerides: 129 mg/dL (ref 0.0–149.0)
VLDL: 25.8 mg/dL (ref 0.0–40.0)

## 2023-06-07 NOTE — Progress Notes (Signed)
  [name], MD Phone: [phone]  Toni Berger is a 77 y.o. female who presents today for follow-up.  Anxiety/depression: Patient notes her anxiety is good right now.  She does take clonazepam mostly at night to help with this.  Notes depression is no more than usual.  No SI.  Chronic leg pain: Patient notes she gets pain in her legs every 4 hours.  She notes bilateral legs hurt throughout her legs.  She notes it does not really matter what she is doing they just bother her.  She takes tramadol as prescribed.  This does not make her drowsy.  Patient notes the tramadol helps significantly with her pain and it allows her to remain ambulatory.  Social History   Tobacco Use  Smoking Status Former  Smokeless Tobacco Never    Current Outpatient Medications on File Prior to Visit  Medication Sig Dispense Refill   amLODipine (NORVASC) 5 MG tablet TAKE 1 TABLET(5 MG) BY MOUTH DAILY 90 tablet 2   clonazePAM (KLONOPIN) 0.5 MG tablet TAKE 2 TABLETS BY MOUTH EVERY 8 TO 12 HOURS AS NEEDED. MAXIMUM 3 DOSES IN 24 HOURS 180 tablet 0   clopidogrel (PLAVIX) 75 MG tablet TAKE 1 TABLET(75 MG) BY MOUTH DAILY 90 tablet 3   cyanocobalamin 1000 MCG tablet Take 1,000 mcg by mouth every evening.     lisinopril (ZESTRIL) 40 MG tablet TAKE 1 TABLET(40 MG) BY MOUTH DAILY 90 tablet 3   pantoprazole (PROTONIX) 40 MG tablet Take 1 tablet (40 mg total) by mouth 2 (two) times daily. 180 tablet 1   traMADol (ULTRAM) 50 MG tablet TAKE 1 TABLET BY MOUTH EVERY 8 HOURS AS NEEDED FOR SEVERE PAIN 90 tablet 0   No current facility-administered medications on file prior to visit.     ROS see history of present illness  Objective  Physical Exam Vitals:   06/07/23 1140  BP: 122/78  Pulse: 65  Temp: 97.8 F (36.6 C)  SpO2: 98%    BP Readings from Last 3 Encounters:  06/07/23 122/78  03/08/23 134/84  03/04/23 (!) 154/78   Wt Readings from Last 3 Encounters:  06/07/23 198 lb 9.6 oz (90.1 kg)  03/08/23  195 lb 3.2 oz (88.5 kg)  01/20/23 197 lb (89.4 kg)    Physical Exam Constitutional:      General: She is not in acute distress.    Appearance: She is not diaphoretic.  Cardiovascular:     Rate and Rhythm: Normal rate and regular rhythm.     Heart sounds: Normal heart sounds.  Pulmonary:     Effort: Pulmonary effort is normal.     Breath sounds: Normal breath sounds.  Skin:    General: Skin is warm and dry.  Neurological:     Mental Status: She is alert.      Assessment/Plan: Please see individual problem list.  Anxiety and depression Assessment & Plan: Chronic issue.  Stable.  Patient will continue to monitor.  She can take clonazepam 1 mg every 8-12 hours as needed.   Bilateral leg pain Assessment & Plan: Chronic issue.  Stable.  Patient to continue tramadol 50 mg every 8 hours as needed for pain.    HYPERTENSION, BENIGN -     Comprehensive metabolic panel -     Lipid panel     Return in about 3 months (around 09/06/2023).   [name], MD [facility] Primary Care [facility]

## 2023-06-07 NOTE — Assessment & Plan Note (Signed)
Chronic issue.  Stable.  Patient will continue to monitor.  She can take clonazepam 1 mg every 8-12 hours as needed.

## 2023-06-07 NOTE — Assessment & Plan Note (Signed)
Chronic issue.  Stable.  Patient to continue tramadol 50 mg every 8 hours as needed for pain.

## 2023-06-08 ENCOUNTER — Ambulatory Visit: Payer: Medicare Other | Admitting: Family Medicine

## 2023-06-09 ENCOUNTER — Encounter: Payer: Self-pay | Admitting: Family Medicine

## 2023-06-09 DIAGNOSIS — R748 Abnormal levels of other serum enzymes: Secondary | ICD-10-CM

## 2023-06-17 ENCOUNTER — Other Ambulatory Visit: Payer: Medicare Other

## 2023-06-17 DIAGNOSIS — R748 Abnormal levels of other serum enzymes: Secondary | ICD-10-CM | POA: Diagnosis not present

## 2023-06-17 LAB — HEPATIC FUNCTION PANEL
ALT: 11 U/L (ref 0–35)
AST: 16 U/L (ref 0–37)
Albumin: 4.2 g/dL (ref 3.5–5.2)
Alkaline Phosphatase: 127 U/L — ABNORMAL HIGH (ref 39–117)
Bilirubin, Direct: 0.1 mg/dL (ref 0.0–0.3)
Total Bilirubin: 0.4 mg/dL (ref 0.2–1.2)
Total Protein: 7.9 g/dL (ref 6.0–8.3)

## 2023-06-18 ENCOUNTER — Encounter: Payer: Self-pay | Admitting: Family Medicine

## 2023-06-18 NOTE — Telephone Encounter (Signed)
Noted. Please get her set up for the labs I ordered.

## 2023-06-23 NOTE — Telephone Encounter (Signed)
Lab appointment scheduled for 06/28/23

## 2023-06-28 ENCOUNTER — Other Ambulatory Visit (INDEPENDENT_AMBULATORY_CARE_PROVIDER_SITE_OTHER): Payer: Medicare Other

## 2023-06-28 DIAGNOSIS — R748 Abnormal levels of other serum enzymes: Secondary | ICD-10-CM | POA: Diagnosis not present

## 2023-06-28 NOTE — Addendum Note (Signed)
Addended by: Jarvis Morgan D on: 06/28/2023 11:05 AM   Modules accepted: Orders

## 2023-06-29 LAB — GAMMA GT: GGT: 21 [IU]/L (ref 0–60)

## 2023-07-01 ENCOUNTER — Other Ambulatory Visit: Payer: Medicare Other

## 2023-07-11 ENCOUNTER — Other Ambulatory Visit: Payer: Self-pay | Admitting: Family Medicine

## 2023-07-11 DIAGNOSIS — M79604 Pain in right leg: Secondary | ICD-10-CM

## 2023-07-12 NOTE — Telephone Encounter (Signed)
Can we check on the alkaline phosphatase lab that is ordered? It has not resulted yet. Was this drawn when she was in the office for labs on 06/28/23?

## 2023-07-13 ENCOUNTER — Encounter: Payer: Self-pay | Admitting: Family Medicine

## 2023-07-13 DIAGNOSIS — F419 Anxiety disorder, unspecified: Secondary | ICD-10-CM

## 2023-07-14 MED ORDER — CLONAZEPAM 0.5 MG PO TABS
ORAL_TABLET | ORAL | 0 refills | Status: DC
Start: 2023-07-14 — End: 2023-08-09

## 2023-07-19 ENCOUNTER — Ambulatory Visit (INDEPENDENT_AMBULATORY_CARE_PROVIDER_SITE_OTHER): Payer: Medicare Other | Admitting: Podiatry

## 2023-07-19 ENCOUNTER — Telehealth: Payer: Self-pay

## 2023-07-19 ENCOUNTER — Encounter: Payer: Self-pay | Admitting: Podiatry

## 2023-07-19 DIAGNOSIS — Q828 Other specified congenital malformations of skin: Secondary | ICD-10-CM

## 2023-07-19 DIAGNOSIS — M79609 Pain in unspecified limb: Secondary | ICD-10-CM

## 2023-07-19 DIAGNOSIS — G629 Polyneuropathy, unspecified: Secondary | ICD-10-CM | POA: Diagnosis not present

## 2023-07-19 DIAGNOSIS — B351 Tinea unguium: Secondary | ICD-10-CM

## 2023-07-19 DIAGNOSIS — L84 Corns and callosities: Secondary | ICD-10-CM | POA: Diagnosis not present

## 2023-07-19 DIAGNOSIS — D689 Coagulation defect, unspecified: Secondary | ICD-10-CM | POA: Diagnosis not present

## 2023-07-19 NOTE — Progress Notes (Unsigned)
  Subjective:  Patient ID: Toni Berger, female    DOB: [birth year],  MRN: [id number]  77 y.o. female presents with {jgcomplaint:23593} No chief complaint on file.    PCP: Glori Luis, MD.  New problem(s): None. {jgcomplaint:23593}  Review of Systems: Negative except as noted in the HPI.   Allergies  Allergen Reactions   Carbidopa-Levodopa Other (See Comments)    REACTION: headache, stomach pain, chest pain and seeing purple colors   Prednisone     Throat swelling    Sulfonamide Derivatives     "stop breathing, eyes roll in back of head and fingers web"   Aquasonic 100 [Ultrasound Gel] Itching   Ciprofloxacin    Oxycodone Diarrhea   Potassium Diarrhea   Povidone-Iodine    Propoxyphene Hcl    Sertraline Hcl     headache   Tetracycline    Acetaminophen Diarrhea    Stomach pain   Metoprolol Other (See Comments)    To low heart rate/ BP   Pravastatin Other (See Comments)    Muscle cramps    Objective:  There were no vitals filed for this visit. Constitutional Patient is a pleasant 77 y.o. {Race/ethnicity:17218} female {jgbodyhabitus:24098} AAO x 3.  Vascular Capillary fill time to digits <3 seconds.  DP/PT pulse(s) are faintly palpable b/l lower extremities. Pedal hair absent b/l. Lower extremity skin temperature gradient warm to cool b/l. No pain with calf compression b/l. No cyanosis or clubbing noted. No ischemia nor gangrene noted b/l. {jgvascular:23595}  Neurologic Protective sensation intact 5/5 intact bilaterally with 10g monofilament b/l. Vibratory sensation intact b/l. No clonus b/l. {jgneuro:23601::"Protective sensation intact 5/5 intact bilaterally with 10g monofilament b/l.","Vibratory sensation intact b/l.","Proprioception intact bilaterally."}  Dermatologic Pedal skin is thin, shiny and atrophic b/l.  No open wounds b/l lower extremities. No interdigital macerations b/l lower extremities. Toenails 1-5 b/l elongated, discolored, dystrophic, thickened,  crumbly with subungual debris and tenderness to dorsal palpation. {jgderm:23598}  Orthopedic: Normal muscle strength 5/5 to all lower extremity muscle groups bilaterally. {jgmsk:23600}   Assessment:   1. Pain due to onychomycosis of nail   2. Porokeratosis   3. Callus   4. Neuropathy   5. Coagulation disorder (HCC)    Plan:  Patient was evaluated and treated and all questions answered. Consent given for treatment as described below: {jgplan:23602::"-Patient/POA to call should there be question/concern in the interim."}  Return in about 3 months (around 10/19/2023).  Freddie Breech, DPM

## 2023-07-20 NOTE — Addendum Note (Signed)
Addended by: Warden Fillers on: 07/20/2023 09:45 AM   Modules accepted: Orders

## 2023-07-23 NOTE — Telephone Encounter (Signed)
Error

## 2023-08-08 ENCOUNTER — Other Ambulatory Visit: Payer: Self-pay | Admitting: Family Medicine

## 2023-08-08 DIAGNOSIS — M79604 Pain in right leg: Secondary | ICD-10-CM

## 2023-08-09 ENCOUNTER — Other Ambulatory Visit: Payer: Self-pay | Admitting: Family Medicine

## 2023-08-09 DIAGNOSIS — F419 Anxiety disorder, unspecified: Secondary | ICD-10-CM

## 2023-08-23 ENCOUNTER — Other Ambulatory Visit: Payer: Self-pay | Admitting: Family Medicine

## 2023-08-23 DIAGNOSIS — K219 Gastro-esophageal reflux disease without esophagitis: Secondary | ICD-10-CM

## 2023-08-31 ENCOUNTER — Encounter: Payer: Self-pay | Admitting: Family Medicine

## 2023-08-31 ENCOUNTER — Ambulatory Visit: Payer: Medicare Other | Admitting: Family Medicine

## 2023-08-31 VITALS — BP 124/80 | HR 61 | Temp 97.6°F | Ht 69.0 in | Wt 194.4 lb

## 2023-08-31 DIAGNOSIS — I10 Essential (primary) hypertension: Secondary | ICD-10-CM

## 2023-08-31 DIAGNOSIS — M79605 Pain in left leg: Secondary | ICD-10-CM | POA: Diagnosis not present

## 2023-08-31 DIAGNOSIS — M79604 Pain in right leg: Secondary | ICD-10-CM

## 2023-08-31 DIAGNOSIS — R748 Abnormal levels of other serum enzymes: Secondary | ICD-10-CM | POA: Insufficient documentation

## 2023-08-31 DIAGNOSIS — F32A Depression, unspecified: Secondary | ICD-10-CM

## 2023-08-31 DIAGNOSIS — F419 Anxiety disorder, unspecified: Secondary | ICD-10-CM | POA: Diagnosis not present

## 2023-08-31 MED ORDER — TRAMADOL HCL 50 MG PO TABS: ORAL_TABLET | ORAL | 0 refills | Status: DC

## 2023-08-31 MED ORDER — CLONAZEPAM 0.5 MG PO TABS
ORAL_TABLET | ORAL | 0 refills | Status: DC
Start: 2023-09-06 — End: 2023-10-01

## 2023-08-31 NOTE — Assessment & Plan Note (Signed)
Mild elevation on prior lab work.  We discussed rechecking elevated alkaline phosphatase isoenzyme given that this was not drawn at her last blood draw.  She defers this at this time.  Discussed it would be reasonable to recheck her alkaline phosphatase level at her next visit when she is due for lab work.

## 2023-08-31 NOTE — Progress Notes (Signed)
Toni Alar, MD Phone: [phone]  Toni Berger is a 77 y.o. female who presents today for f/u.  Anxiety and depression: Patient notes anxiety is relatively stable.  She notes this is adequately managed by clonazepam.  She does feel somewhat depressed around the holidays.  No SI.  Chronic leg pain: Patient notes this is generally stable.  Notes adequate management with tramadol.  She notes no drowsiness with this or the clonazepam.  Patient reports diarrhea with Tylenol.  Reports stomach ulcer with NSAIDs.  Hypertension: Not checking blood pressures at home.  She is taking lisinopril and amlodipine.  No chest pain, shortness of breath.  Stable lower extremity edema.  Social History   Tobacco Use  Smoking Status Former  Smokeless Tobacco Never    Current Outpatient Medications on File Prior to Visit  Medication Sig Dispense Refill   amLODipine (NORVASC) 5 MG tablet TAKE 1 TABLET(5 MG) BY MOUTH DAILY 90 tablet 2   clopidogrel (PLAVIX) 75 MG tablet TAKE 1 TABLET(75 MG) BY MOUTH DAILY 90 tablet 3   cyanocobalamin 1000 MCG tablet Take 1,000 mcg by mouth every evening.     lisinopril (ZESTRIL) 40 MG tablet TAKE 1 TABLET(40 MG) BY MOUTH DAILY 90 tablet 3   pantoprazole (PROTONIX) 40 MG tablet TAKE 1 TABLET(40 MG) BY MOUTH TWICE DAILY BEFORE A MEAL 180 tablet 1   No current facility-administered medications on file prior to visit.     ROS see history of present illness  Objective  Physical Exam Vitals:   08/31/23 1013  BP: 124/80  Pulse: 61  Temp: 97.6 F (36.4 C)  SpO2: 98%    BP Readings from Last 3 Encounters:  08/31/23 124/80  06/07/23 122/78  03/08/23 134/84   Wt Readings from Last 3 Encounters:  08/31/23 194 lb 6.4 oz (88.2 kg)  06/07/23 198 lb 9.6 oz (90.1 kg)  03/08/23 195 lb 3.2 oz (88.5 kg)    Physical Exam Constitutional:      General: She is not in acute distress.    Appearance: She is not diaphoretic.  Cardiovascular:     Rate and Rhythm:  Normal rate and regular rhythm.     Heart sounds: Normal heart sounds.  Pulmonary:     Effort: Pulmonary effort is normal.     Breath sounds: Normal breath sounds.  Skin:    General: Skin is warm and dry.  Neurological:     Mental Status: She is alert.      Assessment/Plan: Please see individual problem list.  Bilateral leg pain Assessment & Plan: Chronic issue.  Stable.  Adequately managed with tramadol.  She will continue tramadol 50 mg every 8 hours as needed for pain.  Orders: -     traMADol HCl; TAKE 1 TABLET BY MOUTH EVERY 8 HOURS AS NEEDED FOR SEVERE PAIN  Dispense: 90 tablet; Refill: 0  Anxiety and depression Assessment & Plan: Chronic issue.  Anxiety is stable.  A little bit of depression during the holidays.  She notes she continues to try to do things she enjoys at home to help with the depression.  She can continue clonazepam 1 mg every 8-12 hours as needed for anxiety.  I did discuss with the patient that she will be seeing a new provider and not every provider is comfortable managing long-term controlled substances.  Advised she would need to discuss management of these medicines with the new provider.   HYPERTENSION, BENIGN Assessment & Plan: Chronic issue.  Adequately controlled for her age.  She will continue amlodipine 5 mg daily and lisinopril 40 mg daily.   Anxiety -     clonazePAM; TAKE 2 TABLETS BY MOUTH EVERY 8 TO 12 HOURS AS NEEDED  Dispense: 180 tablet; Refill: 0  Elevated alkaline phosphatase level Assessment & Plan: Mild elevation on prior lab work.  We discussed rechecking elevated alkaline phosphatase isoenzyme given that this was not drawn at her last blood draw.  She defers this at this time.  Discussed it would be reasonable to recheck her alkaline phosphatase level at her next visit when she is due for lab work.      Return in about 3 months (around 11/29/2023) for Transfer to Dr. Clent Ridges.   Toni Alar, MD [facility] Primary Care [facility]

## 2023-08-31 NOTE — Assessment & Plan Note (Signed)
Chronic issue.  Stable.  Adequately managed with tramadol.  She will continue tramadol 50 mg every 8 hours as needed for pain.

## 2023-08-31 NOTE — Assessment & Plan Note (Signed)
Chronic issue.  Anxiety is stable.  A little bit of depression during the holidays.  She notes she continues to try to do things she enjoys at home to help with the depression.  She can continue clonazepam 1 mg every 8-12 hours as needed for anxiety.  I did discuss with the patient that she will be seeing a new provider and not every provider is comfortable managing long-term controlled substances.  Advised she would need to discuss management of these medicines with the new provider.

## 2023-08-31 NOTE — Assessment & Plan Note (Signed)
Chronic issue.  Adequately controlled for her age.  She will continue amlodipine 5 mg daily and lisinopril 40 mg daily.

## 2023-09-06 ENCOUNTER — Other Ambulatory Visit: Payer: Self-pay

## 2023-09-06 ENCOUNTER — Ambulatory Visit: Payer: Medicare Other | Admitting: Family Medicine

## 2023-09-06 MED ORDER — CLOPIDOGREL BISULFATE 75 MG PO TABS: 75.0000 mg | ORAL_TABLET | Freq: Once | ORAL | 0 refills | Status: AC

## 2023-09-06 NOTE — Telephone Encounter (Signed)
Noted  

## 2023-09-09 ENCOUNTER — Other Ambulatory Visit (INDEPENDENT_AMBULATORY_CARE_PROVIDER_SITE_OTHER): Payer: Medicare Other

## 2023-09-09 ENCOUNTER — Other Ambulatory Visit: Payer: Medicare Other

## 2023-09-09 ENCOUNTER — Telehealth: Payer: Self-pay | Admitting: Family Medicine

## 2023-09-09 DIAGNOSIS — R748 Abnormal levels of other serum enzymes: Secondary | ICD-10-CM

## 2023-09-09 MED ORDER — CLOPIDOGREL BISULFATE 75 MG PO TABS: 75.0000 mg | ORAL_TABLET | Freq: Every day | ORAL | 0 refills | Status: DC

## 2023-09-09 NOTE — Telephone Encounter (Signed)
Copied from CRM [id number]. Topic: General - Call Back - No Documentation >> Sep 09, 2023  1:25 PM Samuel Jester B wrote: Reason for CRM: Pt would like to request a callback to confirm if her pcp will be working this week. Also she would like to know who sent in a prescription for medication clopidogrel.

## 2023-09-09 NOTE — Telephone Encounter (Signed)
fyi

## 2023-09-26 LAB — ALKALINE PHOSPHATASE, ISOENZYMES
Alkaline Phosphatase: 141 [IU]/L — ABNORMAL HIGH (ref 44–121)
BONE FRACTION: 42 % (ref 14–68)
INTESTINAL FRAC.: 0 % (ref 0–18)
LIVER FRACTION: 58 % (ref 18–85)

## 2023-09-28 ENCOUNTER — Encounter: Payer: Self-pay | Admitting: Family Medicine

## 2023-09-29 ENCOUNTER — Other Ambulatory Visit: Payer: Self-pay | Admitting: Family Medicine

## 2023-09-29 DIAGNOSIS — F419 Anxiety disorder, unspecified: Secondary | ICD-10-CM

## 2023-09-29 DIAGNOSIS — M79604 Pain in right leg: Secondary | ICD-10-CM

## 2023-09-29 NOTE — Telephone Encounter (Signed)
 Copied from CRM [id number]. Topic: Clinical - Medication Refill >> Sep 29, 2023  2:08 PM Corin V wrote: Most Recent Primary Care Visit:  Provider: [facility]  Department: [facility]  Visit Type: LAB VISIT  Date: 09/09/2023  Medication: ***  Has the patient contacted their pharmacy?  (Agent: If no, request that the patient contact the pharmacy for the refill. If patient does not wish to contact the pharmacy document the reason why and proceed with request.) (Agent: If yes, when and what did the pharmacy advise?)  Is this the correct pharmacy for this prescription?  If no, delete pharmacy and type the correct one.  This is the patient's preferred pharmacy:  [facility] #[id number] [place], [place] - [address] AT [facility] [address] [place] [address] Phone: [phone] Fax: [phone]   Has the prescription been filled recently?   Is the patient out of the medication?   Has the patient been seen for an appointment in the last year OR does the patient have an upcoming appointment?   Can we respond through MyChart?   Agent: Please be advised that Rx refills may take up to 3 business days. We ask that you follow-up with your pharmacy.

## 2023-09-29 NOTE — Telephone Encounter (Signed)
 Medication: Tramadol  50 mg  Directions:Take 1 tablet by mouth every 8 hours as needed for severe pain  Last given: 09/06/23 Number refills: 0 Last o/v: 08/31/23 Follow up: 12/07/23 Labs:

## 2023-09-29 NOTE — Telephone Encounter (Signed)
 Sent to pharmacy

## 2023-09-30 NOTE — Telephone Encounter (Signed)
Patient is needing her medication released sooner then Sunday she wants to be able to pick the medication up tomorrow she is [place] [address] she is needing the mediation asap tramdol 5o mg  20 day supply clonazePAM (KLONOPIN) 0.5 MG tablet  patient would like for the nurse to call her back regarding this issue she is having

## 2023-10-01 MED ORDER — CLONAZEPAM 0.5 MG PO TABS: ORAL_TABLET | ORAL | 0 refills | Status: DC

## 2023-10-01 NOTE — Telephone Encounter (Signed)
Her refills have been sent to the pharmacy.  Please let her know.

## 2023-10-01 NOTE — Addendum Note (Signed)
Addended by: Birdie Sons, Aryanne Gilleland G on: 10/01/2023 10:10 AM   Modules accepted: Orders

## 2023-10-06 ENCOUNTER — Encounter: Payer: Self-pay | Admitting: Family Medicine

## 2023-10-21 ENCOUNTER — Encounter: Payer: Self-pay | Admitting: Podiatry

## 2023-10-21 ENCOUNTER — Ambulatory Visit: Payer: Medicare Other | Admitting: Podiatry

## 2023-10-21 VITALS — Ht 69.0 in | Wt 194.4 lb

## 2023-10-21 DIAGNOSIS — D689 Coagulation defect, unspecified: Secondary | ICD-10-CM

## 2023-10-21 DIAGNOSIS — M79609 Pain in unspecified limb: Secondary | ICD-10-CM

## 2023-10-21 DIAGNOSIS — L84 Corns and callosities: Secondary | ICD-10-CM

## 2023-10-21 DIAGNOSIS — B351 Tinea unguium: Secondary | ICD-10-CM | POA: Diagnosis not present

## 2023-10-21 DIAGNOSIS — G629 Polyneuropathy, unspecified: Secondary | ICD-10-CM | POA: Diagnosis not present

## 2023-10-25 ENCOUNTER — Other Ambulatory Visit: Payer: Self-pay | Admitting: Family Medicine

## 2023-10-25 DIAGNOSIS — M79604 Pain in right leg: Secondary | ICD-10-CM

## 2023-10-29 NOTE — Progress Notes (Signed)
 Subjective:  Patient ID: Toni Berger, female    DOB: [birth year],  MRN: [id number]  Toni Berger presents to clinic today for at risk foot care with h/o coagulation defect and corn(s)  right lower extremity, porokeratotic lesion(s) b/l feet and painful mycotic nails. Painful toenails interfere with ambulation. Aggravating factors include wearing enclosed shoe gear. Pain is relieved with periodic professional debridement. Painful corns and porokeratotic lesion(s) aggravated when weightbearing with and without shoegear. Pain is relieved with periodic professional debridement.  Chief Complaint  Patient presents with   Nail Problem    Pt is here for [facility] PCP is Dr Danella and LOV was in January.   New problem(s): None.   PCP is Maribeth Camellia MATSU, MD.  Allergies  Allergen Reactions   Carbidopa-Levodopa Other (See Comments)    REACTION: headache, stomach pain, chest pain and seeing purple colors   Prednisone      Throat swelling    Sulfonamide Derivatives     stop breathing, eyes roll in back of head and fingers web   Aquasonic 100 [Ultrasound Gel] Itching   Ciprofloxacin    Oxycodone  Diarrhea   Potassium Diarrhea   Povidone-Iodine    Propoxyphene Hcl    Sertraline  Hcl     headache   Tetracycline    Acetaminophen  Diarrhea    Stomach pain   Metoprolol  Other (See Comments)    To low heart rate/ BP   Pravastatin  Other (See Comments)    Muscle cramps    Review of Systems: Negative except as noted in the HPI.  Objective:  There were no vitals filed for this visit. Toni Berger is a pleasant 78 y.o. female WD, WN in NAD. AAO x 3.  Vascular Examination: Capillary fill time to digits <3 seconds.  DP/PT pulse(s) are faintly palpable b/l lower extremities. Pedal hair absent b/l. Lower extremity skin temperature gradient warm to cool b/l. No pain with calf compression b/l. No cyanosis or clubbing noted. No ischemia nor gangrene noted b/l.   Neurological  Examination: Protective sensation intact 5/5 intact bilaterally with 10g monofilament b/l. Vibratory sensation intact b/l. No clonus b/l. Pt has subjective symptoms of neuropathy.  Dermatological Examination: Pedal skin is thin, shiny and atrophic b/l.  No open wounds b/l lower extremities. No interdigital macerations b/l lower extremities.   Toenails great toes b/l elongated, discolored, dystrophic, thickened, crumbly with subungual debris and tenderness to dorsal palpation.   Nondystrophic toenails 2-5 bilaterally.   Hyperkeratotic lesion(s) bilateral heels, submet head 1 right foot, and submet head 3 left foot.  No erythema, no edema, no drainage, no fluctuance.   Pressure injury interdigitally R 4th toe. No erythema, no edema, no drainage, no fluctuance.  Musculoskeletal Examination: Normal muscle strength 5/5 to all lower extremity muscle groups bilaterally. Hammertoe(s) noted to the R 4th toe and R 5th toe.  Radiographs: None  Assessment/Plan: 1. Pain due to onychomycosis of nail   2. Callus   3. Neuropathy   4. Coagulation disorder (HCC)     -Consent given for treatment as described below: -Examined patient. -Discussed the need to apply padding between toes to avoid development of lesions interdigitally. Patient states she cannot tolerate anything between her toes. -Toenails were debrided in length and girth bilateral great toes with sterile nail nippers and dremel without iatrogenic bleeding.  -Nondystrophic toenails trimmed 2-5 bilaterally. -Callus(es) bilateral heels, submet head 1 right foot, and submet head 3 left foot pared utilizing sterile scalpel blade without complication or incident. Total number debrided =  4. -Patient/POA to call should there be question/concern in the interim.   Return in about 3 months (around 01/18/2024).  Delon LITTIE Merlin, DPM       LOCATION: [address], [place] [address]                    Office [phone]   [facility] LOCATION: [address] [place], [place] [address] Office [phone]

## 2023-11-24 ENCOUNTER — Other Ambulatory Visit: Payer: Self-pay | Admitting: Family Medicine

## 2023-11-24 DIAGNOSIS — M79605 Pain in left leg: Secondary | ICD-10-CM

## 2023-11-24 DIAGNOSIS — F419 Anxiety disorder, unspecified: Secondary | ICD-10-CM

## 2023-11-24 MED ORDER — TRAMADOL HCL 50 MG PO TABS
ORAL_TABLET | ORAL | 0 refills | Status: DC
Start: 2023-11-24 — End: 2023-12-20

## 2023-11-24 MED ORDER — CLONAZEPAM 0.5 MG PO TABS: ORAL_TABLET | ORAL | 0 refills | Status: DC

## 2023-11-24 NOTE — Telephone Encounter (Signed)
 Copied from CRM [id number]. Topic: Clinical - Medication Refill >> Nov 24, 2023 10:32 AM [place] J wrote: Most Recent Primary Care Visit:  Provider: [facility]  Department: [facility]  Visit Type: LAB VISIT  Date: 09/09/2023  Medication:  traMADol (ULTRAM) 50 MG clonazePAM (KLONOPIN) 0.5 MG  Has the patient contacted their pharmacy? Yes (Agent: If no, request that the patient contact the pharmacy for the refill. If patient does not wish to contact the pharmacy document the reason why and proceed with request.) (Agent: If yes, when and what did the pharmacy advise?)  Is this the correct pharmacy for this prescription? Yes If no, delete pharmacy and type the correct one.  This is the patient's preferred pharmacy:  [facility]ORE #[id number] [place], [place] - [address] AT [facility] OF [address] [place] [address] Phone: [phone] Fax: [phone]   Has the prescription been filled recently? No  Is the patient out of the medication? No  Has the patient been seen for an appointment in the last year OR does the patient have an upcoming appointment? Yes  Can we respond through MyChart? Yes  Agent: Please be advised that Rx refills may take up to 3 business days. We ask that you follow-up with your pharmacy.

## 2023-12-01 ENCOUNTER — Telehealth: Payer: Self-pay

## 2023-12-01 NOTE — Telephone Encounter (Signed)
 Copied from CRM [id number]. Topic: General - Other >> Dec 01, 2023  1:53 PM Jon Gills C wrote: Reason for CRM: Patient called in regarding DO NOT RESUSCITATE ORDER , wanted to know if its still current with Dr.Sonnenberg's signature even though he will not be her provider anymore, or if she should get another form signed when she meets her new provider

## 2023-12-07 ENCOUNTER — Encounter: Payer: Medicare Other | Admitting: Family Medicine

## 2023-12-20 ENCOUNTER — Other Ambulatory Visit: Payer: Self-pay | Admitting: Family

## 2023-12-20 ENCOUNTER — Telehealth: Payer: Self-pay | Admitting: Family Medicine

## 2023-12-20 DIAGNOSIS — M79604 Pain in right leg: Secondary | ICD-10-CM

## 2023-12-20 DIAGNOSIS — F419 Anxiety disorder, unspecified: Secondary | ICD-10-CM

## 2023-12-20 MED ORDER — CLOPIDOGREL BISULFATE 75 MG PO TABS
75.0000 mg | ORAL_TABLET | Freq: Every day | ORAL | 0 refills | Status: DC
Start: 2023-12-20 — End: 2024-01-18

## 2023-12-20 MED ORDER — TRAMADOL HCL 50 MG PO TABS: ORAL_TABLET | ORAL | 0 refills | Status: DC

## 2023-12-20 MED ORDER — CLONAZEPAM 0.5 MG PO TABS: ORAL_TABLET | ORAL | 0 refills | Status: DC

## 2023-12-20 NOTE — Telephone Encounter (Signed)
 MyChart message:  I need assistance in getting 3 prescriptions because I don't have a primary physician.  Please notify [facility] for 90 day  suppy Clopidogrel 75MG /1tablet per day and 30 day supply Tramadol 50mg /1 tablet every 8 hours as needed for severe pain and 30 day supply Clonazepam 0.5/take tablets every 8 to 12 hours as needed. [facility] is confused about instructions and will not contact your office . The Clonazepam 0.5mg  was filled 11/25/23 and Tramadol 50 mg was filled 11/24/23 and the Clopidogrel 75mg  was filled 09/09/23. I appreciate your assistance in getting my prescriptions before I run out.

## 2023-12-21 NOTE — Telephone Encounter (Signed)
 Patient Has appt scheduled in May schedule Medication

## 2023-12-22 ENCOUNTER — Other Ambulatory Visit: Payer: Self-pay | Admitting: Family

## 2023-12-22 DIAGNOSIS — M79604 Pain in right leg: Secondary | ICD-10-CM

## 2023-12-22 DIAGNOSIS — F419 Anxiety disorder, unspecified: Secondary | ICD-10-CM

## 2023-12-23 ENCOUNTER — Encounter: Payer: Self-pay | Admitting: Podiatry

## 2023-12-27 ENCOUNTER — Other Ambulatory Visit: Payer: Self-pay | Admitting: Family Medicine

## 2023-12-27 NOTE — Telephone Encounter (Signed)
 Copied from CRM [id number]. Topic: Clinical - Medication Refill >> Dec 27, 2023  4:13 PM Aisha D wrote: Most Recent Primary Care Visit:  Provider: [facility]  Department: [facility]  Visit Type: LAB VISIT  Date: 09/09/2023  Medication: amLODipine (NORVASC) 5 MG tablet, lisinopril (ZESTRIL) 40 MG tablet  Has the patient contacted their pharmacy? Yes (Agent: If no, request that the patient contact the pharmacy for the refill. If patient does not wish to contact the pharmacy document the reason why and proceed with request.) (Agent: If yes, when and what did the pharmacy advise?)  Is this the correct pharmacy for this prescription? Yes If no, delete pharmacy and type the correct one.  This is the patient's preferred pharmacy:  [facility] DRUG STORE #[id number] [place], [place] - [address] AT [facility] OF [address] [place] [address] Phone: [phone] Fax: [phone]   Has the prescription been filled recently? No  Is the patient out of the medication? No  Has the patient been seen for an appointment in the last year OR does the patient have an upcoming appointment? Yes  Can we respond through MyChart? No  Agent: Please be advised that Rx refills may take up to 3 business days. We ask that you follow-up with your pharmacy.

## 2023-12-28 MED ORDER — LISINOPRIL 40 MG PO TABS: 40.0000 mg | ORAL_TABLET | Freq: Every day | ORAL | 3 refills | Status: AC

## 2023-12-28 MED ORDER — AMLODIPINE BESYLATE 5 MG PO TABS: ORAL_TABLET | ORAL | 2 refills | Status: AC

## 2024-01-06 ENCOUNTER — Ambulatory Visit (INDEPENDENT_AMBULATORY_CARE_PROVIDER_SITE_OTHER): Payer: Medicare Other | Admitting: Podiatry

## 2024-01-06 ENCOUNTER — Encounter: Payer: Self-pay | Admitting: Podiatry

## 2024-01-06 DIAGNOSIS — L84 Corns and callosities: Secondary | ICD-10-CM | POA: Diagnosis not present

## 2024-01-06 DIAGNOSIS — B351 Tinea unguium: Secondary | ICD-10-CM | POA: Diagnosis not present

## 2024-01-06 DIAGNOSIS — D689 Coagulation defect, unspecified: Secondary | ICD-10-CM | POA: Diagnosis not present

## 2024-01-06 DIAGNOSIS — M79676 Pain in unspecified toe(s): Secondary | ICD-10-CM

## 2024-01-06 DIAGNOSIS — M79609 Pain in unspecified limb: Secondary | ICD-10-CM

## 2024-01-06 NOTE — Progress Notes (Signed)
 Subjective:  Patient ID: Toni Berger, female    DOB: [birth year],  MRN: [id number]  Toni Berger presents to clinic today for at risk foot care with h/o coagulation defect and painful porokeratotic lesion(s) interdigitally right 4th toe and painful mycotic toenails that limit ambulation. Painful toenails interfere with ambulation. Aggravating factors include wearing enclosed shoe gear. Pain is relieved with periodic professional debridement. Painful porokeratotic lesions are aggravated when weightbearing with and without shoegear. Pain is relieved with periodic professional debridement.   She has brought in her own band-aids with fabric center and states they are more comfortable for her right 4th toe. Chief Complaint  Patient presents with   Nail Problem    "Work on my feet.  I want to talk to her about the place in between my little toes on my right foot.  I got some pads I brought with me that I want her to put on it."   New problem(s): None.   PCP is No primary care provider on file..  Allergies  Allergen Reactions   Carbidopa-Levodopa Other (See Comments)    REACTION: headache, stomach pain, chest pain and seeing purple colors   Prednisone      Throat swelling    Sulfonamide Derivatives     "stop breathing, eyes roll in back of head and fingers web"   Aquasonic 100 [Ultrasound Gel] Itching   Ciprofloxacin    Oxycodone  Diarrhea   Potassium Diarrhea   Povidone-Iodine    Propoxyphene Hcl    Sertraline  Hcl     headache   Tetracycline    Acetaminophen  Diarrhea    Stomach pain   Metoprolol  Other (See Comments)    To low heart rate/ BP   Pravastatin  Other (See Comments)    Muscle cramps    Review of Systems: Negative except as noted in the HPI.  Objective:  There were no vitals filed for this visit. Toni Berger is a pleasant 78 y.o. female in NAD. AAO x 3.  Vascular Examination: CFT <3 seconds b/l. DP/PT pulses faintly palpable b/l. Skin temperature gradient warm  to warm b/l. No pain with calf compression. No ischemia or gangrene. No cyanosis or clubbing noted b/l.    Neurological Examination: Protective sensation intact 5/5 intact bilaterally with 10g monofilament b/l. Vibratory sensation intact b/l. No clonus b/l. Pt has subjective symptoms of neuropathy.  Dermatological Examination: Pedal skin is thin, shiny and atrophic b/l.  No open wounds b/l lower extremities. No interdigital macerations b/l lower extremities.   Toenails great toes b/l elongated, discolored, dystrophic, thickened, crumbly with subungual debris and tenderness to dorsal palpation.   Nondystrophic toenails 2-5 bilaterally.   Hyperkeratotic lesion(s) submet head 3 left foot.  No erythema, no edema, no drainage, no fluctuance.   Pressure injury lateral R 4th toe. No erythema, no edema, no drainage, no fluctuance.  Musculoskeletal Examination: Normal muscle strength 5/5 to all lower extremity muscle groups bilaterally. Hammertoe(s) noted to the R 4th toe and R 5th toe.  Radiographs: None  Assessment/Plan: 1. Pain due to onychomycosis of nail   2. Pre-ulcerative corn or callous   3. Coagulation disorder (HCC)    -Patient was evaluated today. All questions/concerns addressed on today's visit. -Patient to continue soft, supportive shoe gear daily. -Mycotic toenails bilateral great toes were debrided in length and girth with sterile nail nippers and dremel without iatrogenic bleeding. -Nondystrophic toenails trimmed 2-5 bilaterally. -Callus(es) submet head 3 left foot pared utilizing sharp debridement with sterile blade without complication or  incident. Total number debrided =1. -Applied [employer] fabric centered Band-aid to right 4th digit to prevent pressure. -Patient/POA to call should there be question/concern in the interim.   Return in about 9 weeks (around 03/09/2024).  Toni Berger, DPM      [place] [facility] LOCATION: [address], [place] [address]                   Office [phone]   [facility] LOCATION: [address] [name], [place] [address] Office [phone]

## 2024-01-12 ENCOUNTER — Telehealth: Payer: Self-pay

## 2024-01-12 NOTE — Telephone Encounter (Signed)
 Copied from CRM [id number]. Topic: General - Other >> Jan 12, 2024 11:40 AM Jenice Mitts wrote: Reason for CRM: Patient is calling in because she does not wish to receive any information that does not pertain to her. Or any unnecessary  paperwork handed to her if she can be added to a list.   She only wants texts, calls and emails pertaining to her appointments nothing more >> Jan 12, 2024  4:27 PM [place] P wrote: Pt is wanting an email indicating she will not receive any unnecessary paperwork's or items that does not pertain to her via MyChart.

## 2024-01-12 NOTE — Telephone Encounter (Signed)
 Copied from CRM [id number]. Topic: General - Other >> Jan 12, 2024 11:40 AM Jenice Mitts wrote: Reason for CRM: Patient is calling in because she does not wish to receive any information that does not pertain to her. Or any unnecessary  paperwork handed to her if she can be added to a list.   She only wants texts, calls and emails pertaining to her appointments nothing more  I spoke with patient regarding her previous CRM stating she keeps receiving messages, but she had already confirmed her appointment for 01/19/2024.  I let patient know that I can include a message in her appointment note for the visit that states she has confirmed this appointment, but I am unable to turn off the messages she is receiving.  Patient stated she will look at her MyChart to see if she can stop the messages, she may have to stop using MyChart.

## 2024-01-17 ENCOUNTER — Telehealth: Payer: Self-pay

## 2024-01-17 NOTE — Telephone Encounter (Signed)
 Copied from CRM [id number]. Topic: Clinical - Medication Refill >> Jan 17, 2024  9:19 AM Dorthula Gavel H wrote: Most Recent Primary Care Visit:  Provider: [facility]  Department: [facility]  Visit Type: LAB VISIT  Date: 09/09/2023  Medication: traMADol  (ULTRAM ) 50 MG tablet clonazePAM  (KLONOPIN ) 0.5 MG tablet  Has the patient contacted their pharmacy? Yes (Agent: If no, request that the patient contact the pharmacy for the refill. If patient does not wish to contact the pharmacy document the reason why and proceed with request.) (Agent: If yes, when and what did the pharmacy advise?)  Is this the correct pharmacy for this prescription? Yes If no, delete pharmacy and type the correct one.  This is the patient's preferred pharmacy:  [facility] DRUG STORE #[id number] [place], [place] - [address] AT [facility] OF [address] [place] [address] Phone: [phone] Fax: [phone]   Has the prescription been filled recently? Yes  Is the patient out of the medication? No  Has the patient been seen for an appointment in the last year OR does the patient have an upcoming appointment? Yes  Can we respond through MyChart? Yes  Agent: Please be advised that Rx refills may take up to 3 business days. We ask that you follow-up with your pharmacy.

## 2024-01-17 NOTE — Telephone Encounter (Signed)
 Called pt to see if she has enough meds til her appointment that is scheduled on Wednesday.

## 2024-01-18 ENCOUNTER — Other Ambulatory Visit: Payer: Self-pay | Admitting: Family

## 2024-01-18 DIAGNOSIS — M79604 Pain in right leg: Secondary | ICD-10-CM

## 2024-01-19 ENCOUNTER — Telehealth: Payer: Self-pay

## 2024-01-19 ENCOUNTER — Ambulatory Visit (INDEPENDENT_AMBULATORY_CARE_PROVIDER_SITE_OTHER)

## 2024-01-19 VITALS — BP 142/74 | HR 66 | Temp 98.4°F | Ht 69.0 in | Wt 200.6 lb

## 2024-01-19 DIAGNOSIS — K219 Gastro-esophageal reflux disease without esophagitis: Secondary | ICD-10-CM | POA: Diagnosis not present

## 2024-01-19 DIAGNOSIS — R748 Abnormal levels of other serum enzymes: Secondary | ICD-10-CM | POA: Diagnosis not present

## 2024-01-19 DIAGNOSIS — M79605 Pain in left leg: Secondary | ICD-10-CM

## 2024-01-19 DIAGNOSIS — M79604 Pain in right leg: Secondary | ICD-10-CM | POA: Diagnosis not present

## 2024-01-19 DIAGNOSIS — D649 Anemia, unspecified: Secondary | ICD-10-CM | POA: Insufficient documentation

## 2024-01-19 DIAGNOSIS — G2581 Restless legs syndrome: Secondary | ICD-10-CM | POA: Diagnosis not present

## 2024-01-19 DIAGNOSIS — F419 Anxiety disorder, unspecified: Secondary | ICD-10-CM | POA: Diagnosis not present

## 2024-01-19 DIAGNOSIS — Z0289 Encounter for other administrative examinations: Secondary | ICD-10-CM | POA: Insufficient documentation

## 2024-01-19 DIAGNOSIS — I10 Essential (primary) hypertension: Secondary | ICD-10-CM

## 2024-01-19 DIAGNOSIS — R944 Abnormal results of kidney function studies: Secondary | ICD-10-CM

## 2024-01-19 LAB — CBC WITH DIFFERENTIAL/PLATELET
Basophils Absolute: 0.1 10*3/uL (ref 0.0–0.1)
Basophils Relative: 0.7 % (ref 0.0–3.0)
Eosinophils Absolute: 0.1 10*3/uL (ref 0.0–0.7)
Eosinophils Relative: 1.7 % (ref 0.0–5.0)
HCT: 37.9 % (ref 36.0–46.0)
Hemoglobin: 12.3 g/dL (ref 12.0–15.0)
Lymphocytes Relative: 20.6 % (ref 12.0–46.0)
Lymphs Abs: 1.8 10*3/uL (ref 0.7–4.0)
MCHC: 32.4 g/dL (ref 30.0–36.0)
MCV: 85.4 fl (ref 78.0–100.0)
Monocytes Absolute: 0.9 10*3/uL (ref 0.1–1.0)
Monocytes Relative: 10.3 % (ref 3.0–12.0)
Neutro Abs: 5.7 10*3/uL (ref 1.4–7.7)
Neutrophils Relative %: 66.7 % (ref 43.0–77.0)
Platelets: 336 10*3/uL (ref 150.0–400.0)
RBC: 4.44 Mil/uL (ref 3.87–5.11)
RDW: 15.5 % (ref 11.5–15.5)
WBC: 8.6 10*3/uL (ref 4.0–10.5)

## 2024-01-19 LAB — BASIC METABOLIC PANEL WITH GFR
BUN: 28 mg/dL — ABNORMAL HIGH (ref 6–23)
CO2: 28 meq/L (ref 19–32)
Calcium: 9.7 mg/dL (ref 8.4–10.5)
Chloride: 103 meq/L (ref 96–112)
Creatinine, Ser: 1 mg/dL (ref 0.40–1.20)
GFR: 54.17 mL/min — ABNORMAL LOW (ref >60.00)
Glucose, Bld: 80 mg/dL (ref 70–99)
Potassium: 4.8 meq/L (ref 3.5–5.1)
Sodium: 138 meq/L (ref 135–145)

## 2024-01-19 LAB — IBC + FERRITIN
Ferritin: 32.2 ng/mL (ref 10.0–291.0)
Iron: 53 ug/dL (ref 42–145)
Saturation Ratios: 13.1 % — ABNORMAL LOW (ref 20.0–50.0)
TIBC: 403.2 ug/dL (ref 250.0–450.0)
Transferrin: 288 mg/dL (ref 212.0–360.0)

## 2024-01-19 LAB — HEPATIC FUNCTION PANEL
ALT: 11 U/L (ref 0–35)
AST: 17 U/L (ref 0–37)
Albumin: 4.3 g/dL (ref 3.5–5.2)
Alkaline Phosphatase: 114 U/L (ref 39–117)
Bilirubin, Direct: 0 mg/dL (ref 0.0–0.3)
Total Bilirubin: 0.4 mg/dL (ref 0.2–1.2)
Total Protein: 8.2 g/dL (ref 6.0–8.3)

## 2024-01-19 MED ORDER — CLOPIDOGREL BISULFATE 75 MG PO TABS
75.0000 mg | ORAL_TABLET | Freq: Every day | ORAL | 0 refills | Status: AC
Start: 2024-01-19 — End: ?

## 2024-01-19 MED ORDER — CLONAZEPAM 0.25 MG PO TBDP: 0.2500 mg | ORAL_TABLET | Freq: Two times a day (BID) | ORAL | Status: DC

## 2024-01-19 MED ORDER — TRAMADOL HCL 50 MG PO TABS: 50.0000 mg | ORAL_TABLET | Freq: Two times a day (BID) | ORAL | 0 refills | Status: AC

## 2024-01-19 MED ORDER — CLONAZEPAM 0.25 MG PO TBDP: 0.2500 mg | ORAL_TABLET | Freq: Two times a day (BID) | ORAL | 0 refills | Status: DC

## 2024-01-19 MED ORDER — PANTOPRAZOLE SODIUM 40 MG PO TBEC
40.0000 mg | DELAYED_RELEASE_TABLET | Freq: Two times a day (BID) | ORAL | 1 refills | Status: AC
Start: 2024-01-19 — End: ?

## 2024-01-19 MED ORDER — CLONAZEPAM 0.5 MG PO TABS: 0.2500 mg | ORAL_TABLET | Freq: Two times a day (BID) | ORAL | 0 refills | Status: AC | PRN

## 2024-01-19 NOTE — Assessment & Plan Note (Signed)
 Check liver function today, evident on previous labs, reviewed.

## 2024-01-19 NOTE — Telephone Encounter (Signed)
 Went to grab patient from the lobby waiting area for patients. Grabbed patient and she immediately rudely asked me why I did not have a mask on. I informed her that due to [facility] policy staff does not have to wear a mask unless they personally chose to wear. After leaving the weigh station with the patient and walking her down the hall, she then asked me will the doctor have a mask on. I kindly responded no, but if she would like for the provider to have on one, we could kindly ask her if she would wear a mask. Patient proceeds to say that our office only sees sick people and nothing else. I informed her that we see a variety of patients for different things. We, then got into the exam room to complete triaging the patient and she cuts me off rudely to say that she has already went through her chart and do everything. Patient was informed that I have to complete the triage, as it may have been some things missed and different. She handed me a paperwork with medical and surgical history to make sure it was up to date in her chart. Patient chart was updated and I asked patient if she would like her documentation back and if not, I can shred the document. Patient asked me was it a Archivist or a company. I informed her it was a shred bin. Patient verbalized you can get rid of it.

## 2024-01-19 NOTE — Assessment & Plan Note (Signed)
 Chronic, stable on Protonix  40 mg, twice a day refill sent.

## 2024-01-19 NOTE — Telephone Encounter (Signed)
 Pharmacist was notified of Dr Casimir Cleaver recommendations to call and cancel the Klonopin  0.25 mg prescription. Provider has sent over a new prescription for Klonopin  0.5 mg tablet. Pharmacist verbalized understanding.

## 2024-01-19 NOTE — Assessment & Plan Note (Signed)
 Has had anemia in previous CBC, will check CBC, iron  panel today.

## 2024-01-19 NOTE — Addendum Note (Signed)
 Addended by: Giorgio Chabot on: 01/19/2024 04:03 PM   Modules accepted: Orders

## 2024-01-19 NOTE — Assessment & Plan Note (Signed)
 Counseled patient that Clonazepam  is really not indicated for this. We will check CBC, iron  panel today. I also counseled iron  infusion if iron  level seems to be low as patient absolutely does not want oral iron  supplement.

## 2024-01-19 NOTE — Assessment & Plan Note (Addendum)
 Chronic issue.  GAD-7 and PHQ-9 reviewed. I also reviewed Dr. Arnaldo Betters note from her last appointment where he had counseled patient on Clonazepam  being controlled substance, need for regular follow up etc.  Patient was evaluated today I explained her that Clonazepam  is not a first line medication for anxiety These medications can carry significant risks, epically in older adults including increased risk of dementia, risk of fall, misuse potential, worsening anxiety, risk of sedation. I recommend we taper her off Clonazepam  gradually for safety. Also recommended psychiatry evaluation, referral for long-term anxiety management. Patient was initially unhappy with the plan and requested a different provider. After further discussion, patient agreed to sign a controlled substances agreement today, obtain UDS, follow up in 3-4 weeks for gradual tapering of Clonazepam .   PDMP reviewed, Clonazepam  taper as follows was initiated to reduce risk of withdrawal s/e like insomnia, seizures: - Stop Clonazepam  0.5 mg. Start Clonazepam  0.25 mg, twice a day for 4 weeks. F/U before she needs refill

## 2024-01-19 NOTE — Telephone Encounter (Signed)
 Please call the pharmacy to let them know medication adjustment made, take half tablet of 0.5 mg Clonazepam  twice a day as needed for anxiety. I only sent her 15 tablets which should last her for one month.   Thank you,  Jacklin Mascot, MD   1. Anxiety (Primary) - clonazePAM  (KLONOPIN ) 0.5 MG tablet; Take 0.5 tablets (0.25 mg total) by mouth 2 (two) times daily as needed for anxiety.  Dispense: 15 tablet; Refill: 0   Jacklin Mascot, MD

## 2024-01-19 NOTE — Assessment & Plan Note (Signed)
 Chronic issue.  Continue amlodipine  5 mg daily and lisinopril  40 mg daily. Check renal function and electrolytes.

## 2024-01-19 NOTE — Patient Instructions (Addendum)
-   Continue Tramadol  50 mg, twice a day.  - We will reduce dose of Clonazepam  from 0.5 mg to 0.25, twice a day for 4 weeks. I recommend I see you in 4 weeks before you need a refill.  - We are getting urine drug screen and controlled substance agreement done today.

## 2024-01-19 NOTE — Telephone Encounter (Signed)
 Copied from CRM [id number]. Topic: Clinical - Prescription Issue >> Jan 19, 2024  2:31 PM Kita Perish H wrote: Reason for CRM: Patient states that the prescription sent to [facility] for the clonazePAM  (KLONOPIN ) 0.25 MG disintegrating tablets is showing OTC at the end and costing her $66.00. Patient states that's not the medication she had previously because she's never had to pay nor did it say OTC at the end, please reach out to patient for clarity, thanks.  Sharmeen [phone]

## 2024-01-19 NOTE — Progress Notes (Signed)
 Established Patient Office Visit   Subjective  Patient ID: OTHA CARLYLE, female    DOB: Jan 17, 1946  Age: 78 y.o. MRN: [id number]  Chief Complaint  Patient presents with   Transitions Of Care    She  has a past medical history of Allergy, Anxiety, Arthritis, Closed rib fracture (01/09/2016), Fibrocystic breast, Hyperlipidemia, Hypertension, Lipoma, Loss of consciousness (HCC) (12/15/2015), Migraines, Osteoporosis, unspecified, Restless leg syndrome, Stroke (cerebrum) (HCC), Upper GI bleed (03/27/2019), and Venous stasis dermatitis (01/19/2020).  HPI Established patient of Dr. Lovetta Rucks presenting for transfer of care. Patient's last visit with Dr. Lovetta Rucks was on 06/07/2023. She was recommended for a 3 M follow up by Dr. Lovetta Rucks.   1) Requesting refill on Clonazepam , Tramadol : - Patient is requesting refill on Clonazepam  0.5 mg and Tramadol  50 mg. Patient reports she has been taking Clonazepam  o.5 mg anywhere from 1-2 tablets during day time and 1-2 tablets at night time. She is also taking Tramadol  50 mg, every 8 hourly as needed. She reports she is on these medications for restless leg, situational anxiety and lower leg pain.  - Last refill was filled by patient for  Clonazepam  0.5 mg on 12/22/23, with 180 tablets. - Last refill on Tramadol  50 mg was filled by patient on 12/22/23 with 90 tablets.  - Patient reports she only has couple of Tramadol  left and requests refill on them.  - Patient uses a caine for ambulation due to b/l lower leg pain, RSLS. She reports need to move her legs at night and when she is stiting for extended period of time.   2) Patient has a h/o b/l venous insufficiency. She has a h/o normocytic normochromic anemia. She is currently not seeing vascular surgery, psychiatry, not on iron  supplements. She has tried iron  supplements in the past and reports she had constipation, dark stool. She does not want to go back to taking iron  oral supplements.   3) Anxiety:  - Patient  reports she has seen multiple psychiatrist in the past and is not interested in counseling, seeing a physiatrist at this time. She denies SI/HI.  - Patient states she has social anxiety and can only go out to do couple of chores that requires her to leave her home.  - Patient is a widow, does not have children. She lives by herself.  She watches TV  for fun, does not socialize with her neighbors. She manages ADLs (including taxes, finances). She has a POA in file which lists Joye Nobles but patient reports she has elected Jenette Mitchell as her POA. We do not have that in file.   4) Essential hypertension: Currently on Amlodipine  5 mg daily, Lisinopril  40 mg daily.  Reports she takes these daily.   5) Has h/o Alkaline phosphatase elevated in 06/17/23, 06/07/23. GGT normal on 06/28/23.   6) Elevated total cholesterol, LDL on 06/07/23. Not interested in cholesterol lowering medications thus not interested in rechecking cholesterol level.   7) GFR: 53.36, Cr 1.02 on 10/28/22.  8) Anemia with normocytic, normochromic: 01/19/22. No colonoscopy in file.   9)  Acid reflux: Reports takes Protonix  40 mg twice a day.   ROS As per HPI    Objective:     BP (!) 142/74   Pulse 66   Temp 98.4 F (36.9 C) (Oral)   Ht 5\' 9"  (1.753 m)   Wt 200 lb 9.6 oz (91 kg)   SpO2 97%   BMI 29.62 kg/m      08/31/2023  10:25 AM 06/07/2023   11:41 AM 03/08/2023   12:15 PM  Depression screen PHQ 2/9  Decreased Interest 0 0 0  Down, Depressed, Hopeless 0 0 0  PHQ - 2 Score 0 0 0  Altered sleeping 0 1 0  Tired, decreased energy 0 1 0  Change in appetite 0 0 0  Feeling bad or failure about yourself  0 0 0  Trouble concentrating 0 0 0  Moving slowly or fidgety/restless 0 0 0  Suicidal thoughts 0 0 0  PHQ-9 Score 0 2 0  Difficult doing work/chores Not difficult at all Not difficult at all Not difficult at all      08/31/2023   10:25 AM 06/07/2023   11:42 AM 03/08/2023   12:15 PM 10/28/2022    9:58 AM  GAD 7  : Generalized Anxiety Score  Nervous, Anxious, on Edge 0 0 0 1  Control/stop worrying 0 0 0 0  Worry too much - different things 0 0 0 0  Trouble relaxing 0 0 0 1  Restless 0 0 0 1  Easily annoyed or irritable 0 0 0 1  Afraid - awful might happen 0 0 0 0  Total GAD 7 Score 0 0 0 4  Anxiety Difficulty Not difficult at all Not difficult at all Not difficult at all Not difficult at all    Physical Exam HENT:     Head: Normocephalic and atraumatic.  Cardiovascular:     Rate and Rhythm: Normal rate.  Pulmonary:     Effort: Pulmonary effort is normal.     Breath sounds: Normal breath sounds.  Abdominal:     General: Bowel sounds are normal.  Musculoskeletal:     Cervical back: Normal range of motion and neck supple.     Comments: B/L lower legs:  Mild erythema of b/l lower legs noted. Skin intact without ulcers. No pitting edema. Lower legs are warm to touch, without fluctuance, induration, no purulence.   Skin:    General: Skin is warm.  Neurological:     Mental Status: She is alert and oriented to person, place, and time.  Psychiatric:        Mood and Affect: Mood is depressed.        Behavior: Behavior is cooperative.       08/31/2023   10:25 AM 06/07/2023   11:42 AM 03/08/2023   12:15 PM 10/28/2022    9:58 AM  GAD 7 : Generalized Anxiety Score  Nervous, Anxious, on Edge 0 0 0 1  Control/stop worrying 0 0 0 0  Worry too much - different things 0 0 0 0  Trouble relaxing 0 0 0 1  Restless 0 0 0 1  Easily annoyed or irritable 0 0 0 1  Afraid - awful might happen 0 0 0 0  Total GAD 7 Score 0 0 0 4  Anxiety Difficulty Not difficult at all Not difficult at all Not difficult at all Not difficult at all       08/31/2023   10:25 AM 06/07/2023   11:41 AM 03/08/2023   12:15 PM  Depression screen PHQ 2/9  Decreased Interest 0 0 0  Down, Depressed, Hopeless 0 0 0  PHQ - 2 Score 0 0 0  Altered sleeping 0 1 0  Tired, decreased energy 0 1 0  Change in appetite 0 0 0  Feeling  bad or failure about yourself  0 0 0  Trouble concentrating 0 0 0  Moving slowly  or fidgety/restless 0 0 0  Suicidal thoughts 0 0 0  PHQ-9 Score 0 2 0  Difficult doing work/chores Not difficult at all Not difficult at all Not difficult at all       No results found for any visits on 01/19/24.  The ASCVD Risk score (Arnett DK, et al., 2019) failed to calculate for the following reasons:   Risk score cannot be calculated because patient has a medical history suggesting prior/existing ASCVD    Assessment & Plan:  Anxiety Assessment & Plan: Chronic issue.  GAD-7 and PHQ-9 reviewed. I also reviewed Dr. Arnaldo Betters note from her last appointment where he had counseled patient on Clonazepam  being controlled substance, need for regular follow up etc.  Patient was evaluated today I explained her that Clonazepam  is not a first line medication for anxiety These medications can carry significant risks, epically in older adults including increased risk of dementia, risk of fall, misuse potential, worsening anxiety, risk of sedation. I recommend we taper her off Clonazepam  gradually for safety. Also recommended psychiatry evaluation, referral for long-term anxiety management. Patient was initially unhappy with the plan and requested a different provider. After further discussion, patient agreed to sign a controlled substances agreement today, obtain UDS, follow up in 3-4 weeks for gradual tapering of Clonazepam .   PDMP reviewed, Clonazepam  taper as follows was initiated to reduce risk of withdrawal s/e like insomnia, seizures: - Stop Clonazepam  0.5 mg. Start Clonazepam  0.25 mg, twice a day for 4 weeks. F/U before she needs refill   Orders: -     Urine drugs of abuse scrn w alc, routine (Ref Lab) -     clonazePAM ; Take 1 tablet (0.25 mg total) by mouth 2 (two) times daily.  Dispense: 60 tablet; Refill: 0  Gastroesophageal reflux disease without esophagitis Assessment & Plan: Chronic, stable on  Protonix  40 mg, twice a day refill sent.   Orders: -     Pantoprazole  Sodium; Take 1 tablet (40 mg total) by mouth 2 (two) times daily.  Dispense: 180 tablet; Refill: 1  Bilateral leg pain Assessment & Plan: On Tramadol  50 mg, taking three three times a day. No UDS, controlled substance agreement in file. UDS, controlled substance agreement as mentioned for plan in anxiety. I reviewed PDMP, last refill for Tramadol  was filled by patient on 12/22/23 (total of 90 tab). Reduce dose to Tramadol  50 mg, twice a day. Refill sent to pharmacy.   Orders: -     Urine drugs of abuse scrn w alc, routine (Ref Lab)  HYPERTENSION, BENIGN Assessment & Plan: Chronic issue.  Continue amlodipine  5 mg daily and lisinopril  40 mg daily. Check renal function and electrolytes.   Decreased GFR -     Basic metabolic panel with GFR  Elevated alkaline phosphatase level Assessment & Plan: Check liver function today, evident on previous labs, reviewed.   Orders: -     Hepatic function panel  Anemia, normocytic normochromic Assessment & Plan: Has had anemia in previous CBC, will check CBC, iron  panel today.   Orders: -     CBC with Differential/Platelet -     IBC + Ferritin  Restless leg syndrome Assessment & Plan: Counseled patient that Clonazepam  is really not indicated for this. We will check CBC, iron  panel today. I also counseled iron  infusion if iron  level seems to be low as patient absolutely does not want oral iron  supplement.   Orders: -     IBC + Ferritin  Medication management contract agreement Assessment &  Plan: Defer to plan per anxiety.    Other orders -     Clopidogrel  Bisulfate; Take 1 tablet (75 mg total) by mouth daily.  Dispense: 90 tablet; Refill: 0 -     traMADol  HCl; Take 1 tablet (50 mg total) by mouth in the morning and at bedtime.  Dispense: 60 tablet; Refill: 0   I spent 50 minutes on the day of this face to face encounter reviewing patient's most recent visit with  previous PCP, phq, 9 gad 7, counseling her on risks of medications she is currently taking, evidence based medication to help for anxiety, restless leg syndrome, appropriate referral, prior relevant surgical and non surgical procedures, recent  labs, reviewing the assessment and plan with patient, and post visit ordering and reviewing of  diagnostics and therapeutics with patient .   Inda Mcglothen, MD   Return in about 4 weeks (around 02/16/2024), or 3-4 weeks for benzo tapering.   Jacklin Mascot, MD

## 2024-01-19 NOTE — Progress Notes (Signed)
 Hold result till patient's appointment with me on 02/16/2024, per discussion with the patient on 01/19/24 appointment. She would like to discuss the result in person.  Jacklin Mascot, MD

## 2024-01-19 NOTE — Assessment & Plan Note (Signed)
 Defer to plan per anxiety.

## 2024-01-19 NOTE — Assessment & Plan Note (Addendum)
 D/D includes b/l venous insuffiencey, peripheral vascular disease. Does not want to see vascular surgery department On Tramadol  50 mg, taking three three times a day. No UDS, controlled substance agreement in file. UDS, controlled substance agreement as mentioned for plan in anxiety. I reviewed PDMP, last refill for Tramadol  was filled by patient on 12/22/23 (total of 90 tab). Reduce dose to Tramadol  50 mg, twice a day. Refill sent to pharmacy.

## 2024-01-21 ENCOUNTER — Telehealth: Payer: Self-pay

## 2024-01-21 NOTE — Telephone Encounter (Signed)
 We find necessary to inform you that [facility] and all [facility] Pract[facility]/Provides will no longer be able to provide medical care to you. We believe that our patient/provider relationship has been compromised and thus would request that you seek care elsewhere. 01/21/24

## 2024-01-24 NOTE — Telephone Encounter (Signed)
Dismissal letter has been completed.

## 2024-01-25 ENCOUNTER — Ambulatory Visit: Payer: Self-pay

## 2024-01-25 DIAGNOSIS — F419 Anxiety disorder, unspecified: Secondary | ICD-10-CM

## 2024-01-25 LAB — DRUG PROFILE 799031: BENZODIAZEPINES: NEGATIVE

## 2024-01-25 LAB — URINE DRUGS OF ABUSE SCREEN W ALC, ROUTINE (REF LAB)
Amphetamines, Urine: NEGATIVE ng/mL
Barbiturate Quant, Ur: NEGATIVE ng/mL
Cannabinoid Quant, Ur: NEGATIVE ng/mL
Cocaine (Metab.): NEGATIVE ng/mL
Creatinine, Urine: 152.2 mg/dL (ref 20.0–300.0)
Ethanol, Urine: NEGATIVE %
Methadone Screen, Urine: NEGATIVE ng/mL
Nitrite Urine, Quantitative: NEGATIVE ug/mL
OPIATE SCREEN URINE: NEGATIVE ng/mL
PCP Quant, Ur: NEGATIVE ng/mL
Propoxyphene: NEGATIVE ng/mL
pH, Urine: 5.4 (ref 4.5–8.9)

## 2024-01-27 ENCOUNTER — Ambulatory Visit: Payer: Medicare Other | Admitting: Podiatry

## 2024-02-11 DIAGNOSIS — Z8673 Personal history of transient ischemic attack (TIA), and cerebral infarction without residual deficits: Secondary | ICD-10-CM | POA: Diagnosis not present

## 2024-02-11 DIAGNOSIS — Z1331 Encounter for screening for depression: Secondary | ICD-10-CM | POA: Diagnosis not present

## 2024-02-11 DIAGNOSIS — I10 Essential (primary) hypertension: Secondary | ICD-10-CM | POA: Diagnosis not present

## 2024-02-11 DIAGNOSIS — I872 Venous insufficiency (chronic) (peripheral): Secondary | ICD-10-CM | POA: Diagnosis not present

## 2024-02-11 DIAGNOSIS — G894 Chronic pain syndrome: Secondary | ICD-10-CM | POA: Diagnosis not present

## 2024-02-16 ENCOUNTER — Ambulatory Visit

## 2024-02-17 DIAGNOSIS — Z1331 Encounter for screening for depression: Secondary | ICD-10-CM | POA: Diagnosis not present

## 2024-02-23 ENCOUNTER — Emergency Department

## 2024-02-23 ENCOUNTER — Emergency Department
Admission: EM | Admit: 2024-02-23 | Discharge: 2024-02-23 | Disposition: A | Discharge status: Home / Self Care | Attending: Emergency Medicine | Admitting: Emergency Medicine

## 2024-02-23 ENCOUNTER — Other Ambulatory Visit: Payer: Self-pay

## 2024-02-23 DIAGNOSIS — M546 Pain in thoracic spine: Secondary | ICD-10-CM | POA: Diagnosis not present

## 2024-02-23 DIAGNOSIS — M47816 Spondylosis without myelopathy or radiculopathy, lumbar region: Secondary | ICD-10-CM | POA: Diagnosis not present

## 2024-02-23 DIAGNOSIS — N179 Acute kidney failure, unspecified: Secondary | ICD-10-CM | POA: Insufficient documentation

## 2024-02-23 DIAGNOSIS — Z7901 Long term (current) use of anticoagulants: Secondary | ICD-10-CM | POA: Insufficient documentation

## 2024-02-23 DIAGNOSIS — M47814 Spondylosis without myelopathy or radiculopathy, thoracic region: Secondary | ICD-10-CM | POA: Diagnosis not present

## 2024-02-23 DIAGNOSIS — R69 Illness, unspecified: Secondary | ICD-10-CM | POA: Diagnosis not present

## 2024-02-23 DIAGNOSIS — M51369 Other intervertebral disc degeneration, lumbar region without mention of lumbar back pain or lower extremity pain: Secondary | ICD-10-CM | POA: Diagnosis not present

## 2024-02-23 DIAGNOSIS — R55 Syncope and collapse: Secondary | ICD-10-CM | POA: Diagnosis not present

## 2024-02-23 DIAGNOSIS — I6381 Other cerebral infarction due to occlusion or stenosis of small artery: Secondary | ICD-10-CM | POA: Diagnosis not present

## 2024-02-23 DIAGNOSIS — S0990XA Unspecified injury of head, initial encounter: Secondary | ICD-10-CM | POA: Diagnosis not present

## 2024-02-23 DIAGNOSIS — Z043 Encounter for examination and observation following other accident: Secondary | ICD-10-CM | POA: Diagnosis not present

## 2024-02-23 DIAGNOSIS — I700 Atherosclerosis of aorta: Secondary | ICD-10-CM | POA: Diagnosis not present

## 2024-02-23 DIAGNOSIS — W19XXXA Unspecified fall, initial encounter: Secondary | ICD-10-CM | POA: Insufficient documentation

## 2024-02-23 DIAGNOSIS — I6782 Cerebral ischemia: Secondary | ICD-10-CM | POA: Diagnosis not present

## 2024-02-23 LAB — BASIC METABOLIC PANEL WITH GFR
Anion gap: 10 (ref 5–15)
BUN: 29 mg/dL — ABNORMAL HIGH (ref 8–23)
CO2: 23 mmol/L (ref 22–32)
Calcium: 9.3 mg/dL (ref 8.9–10.3)
Chloride: 104 mmol/L (ref 98–111)
Creatinine, Ser: 1.47 mg/dL — ABNORMAL HIGH (ref 0.44–1.00)
GFR, Estimated: 36 mL/min — ABNORMAL LOW (ref >60)
Glucose, Bld: 92 mg/dL (ref 70–99)
Potassium: 3.8 mmol/L (ref 3.5–5.1)
Sodium: 137 mmol/L (ref 135–145)

## 2024-02-23 LAB — CBC
HCT: 37.5 % (ref 36.0–46.0)
Hemoglobin: 12.2 g/dL (ref 12.0–15.0)
MCH: 27.5 pg (ref 26.0–34.0)
MCHC: 32.5 g/dL (ref 30.0–36.0)
MCV: 84.5 fL (ref 80.0–100.0)
Platelets: 363 10*3/uL (ref 150–400)
RBC: 4.44 MIL/uL (ref 3.87–5.11)
RDW: 14.6 % (ref 11.5–15.5)
WBC: 12.5 10*3/uL — ABNORMAL HIGH (ref 4.0–10.5)
nRBC: 0 % (ref 0.0–0.2)

## 2024-02-23 LAB — TROPONIN I (HIGH SENSITIVITY): Troponin I (High Sensitivity): 9 ng/L (ref <18)

## 2024-02-23 NOTE — ED Triage Notes (Signed)
 Pt to ED via [facility] from home for a fall. Pt reported to EMS that she fell and was unable to get out of the floor for 20 minutes. Pt states that she lost her balance and fell hitting her head. Pt is on blood thinners. Pt seems confused at this time. Pt stating that she did not fall, pt will say she hit head, then will say she did not hit her head. Pt has redness on bilateral lower legs but states that this is normal for her.

## 2024-02-23 NOTE — ED Notes (Signed)
 pt ambulated with a steady gait, no assistance needed.

## 2024-02-23 NOTE — ED Provider Notes (Signed)
 [facility] Provider Note    Event Date/Time   First MD Initiated Contact with Patient 02/23/24 [phone]     (approximate)   History   Fall   HPI  Toni Berger is a 78 y.o. female patient presents to the emergency department today following a fall.  States that she was walking down the hallway and started to feel off and then had a fall.  She is uncertain of what caused her to fall.  States that she was unable to get off the floor for approximately 10 to 20 minutes.  Then she states that she was able to get up and ambulate.  Does state that she is on blood thinners for her atrial fibrillation.  Denies hitting her head.  Denies any loss of urinary or bowel.  No tongue biting.  No history of seizure.  States that she was started on a fluid pill but she has not been taking it because it makes her urinate over the night.  Denies any dysuria, urinary urgency or frequency.     Physical Exam   Triage Vital Signs: ED Triage Vitals  Encounter Vitals Group     BP      Systolic BP Percentile      Diastolic BP Percentile      Pulse      Resp      Temp      Temp src      SpO2      Weight      Height      Head Circumference      Peak Flow      Pain Score      Pain Loc      Pain Education      Exclude from Growth Chart     Most recent vital signs: Vitals:   02/23/24 1130 02/23/24 1200  BP: (!) 157/65 (!) 153/81  Pulse: 64 71  Resp: 15 (!) 22  Temp:    SpO2: 99% 97%    Physical Exam Constitutional:      Appearance: She is well-developed.  HENT:     Head: Atraumatic.  Eyes:     Conjunctiva/sclera: Conjunctivae normal.  Cardiovascular:     Rate and Rhythm: Regular rhythm.  Pulmonary:     Effort: No respiratory distress.  Abdominal:     General: There is no distension.  Musculoskeletal:        General: Tenderness (Midline thoracic spine tenderness to palpation) present. Normal range of motion.     Cervical back: Normal range of motion. No  tenderness.  Skin:    General: Skin is warm.  Neurological:     Mental Status: She is alert. Mental status is at baseline.     IMPRESSION / MDM / ASSESSMENT AND PLAN / ED COURSE  I reviewed the triage vital signs and the nursing notes.  Differential diagnosis including syncope, intracranial hemorrhage, ACS, dysrhythmia, electrolyte abnormality, dehydration  EKG  I, Viviano Ground, the attending physician, personally viewed and interpreted this ECG.   Rate: Normal  Rhythm: Normal sinus  Axis: Normal  Intervals: Normal  ST&T Change: None  No tachycardic or bradycardic dysrhythmias while on cardiac telemetry.  RADIOLOGY  CT scan of the head with no signs of intracranial hemorrhage. X-ray thoracic and lumbar spine with no acute fracture or dislocation.  LABS (all labs ordered are listed, but only abnormal results are displayed) Labs interpreted as -    Labs Reviewed  CBC -  Abnormal; Notable for the following components:      Result Value   WBC 12.5 (*)    All other components within normal limits  BASIC METABOLIC PANEL WITH GFR - Abnormal; Notable for the following components:   BUN 29 (*)    Creatinine, Ser 1.47 (*)    GFR, Estimated 36 (*)    All other components within normal limits  URINALYSIS, W/ REFLEX TO CULTURE (INFECTION SUSPECTED)  TROPONIN I (HIGH SENSITIVITY)     MDM    Patient able to ambulate in the emergency department with no difficulties.  Normal gait.  Has a nonfocal neurologic exam have a low suspicion for CVA.  Patient does appear to have a mild acute kidney injury, does appear to be prerenal.  Patient unable to provide a urine in the emergency department.  Denies any dysuria, urinary urgency or frequency and have a low suspicion for urinary tract infection.  Does have some mild leukocytosis.  Patient's abdomen is nontender to palpation.  No findings concerning for cellulitis no obvious wounds.  Discussed at length with the patient the need to  follow-up closely with her primary care physician this week to have repeat lab work and repeat reevaluation.  Patient expressed understanding and states that she has an appointment scheduled for this next week.  Discussed return for any ongoing or worsening symptoms or any recurrent falls.  Patient expressed understanding and states that she will follow-up with her primary care physician.  No questions at time of discharge.   PROCEDURES:  Critical Care performed: No  Procedures  Patient's presentation is most consistent with acute presentation with potential threat to life or bodily function.   MEDICATIONS ORDERED IN ED: Medications - No data to display  FINAL CLINICAL IMPRESSION(S) / ED DIAGNOSES   Final diagnoses:  Fall, initial encounter  Syncope, unspecified syncope type  AKI (acute kidney injury) (HCC)     Rx / DC Orders   ED Discharge Orders     None        Note:  This document was prepared using Dragon voice recognition software and may include unintentional dictation errors.   Viviano Ground, MD 02/23/24 1625

## 2024-02-23 NOTE — Discharge Instructions (Signed)
 You are seen in the emergency department after an episode of passing out.  You had some mild back pain and we did a CT scan of your head and x-rays of your back that did not show any internal bleeding or fractures of your bones.  On your CT scan of your head they did prior strokes in the past.  Your lab work, EKG and heart enzymes were normal, do not believe you had a heart attack today.  It is importantly follow-up closely with your primary care physician so they can continue to workup your episode of passing out.  Your kidney function was slightly worse than your normal.  Drink plenty of fluids and stay hydrated.  It is important that you take your medications as prescribed and follow-up with your primary care physician this week so they can recheck your lab work.  Return to the emergency department for any worsening symptoms.

## 2024-03-03 DIAGNOSIS — R4189 Other symptoms and signs involving cognitive functions and awareness: Secondary | ICD-10-CM | POA: Diagnosis not present

## 2024-03-03 DIAGNOSIS — Z8673 Personal history of transient ischemic attack (TIA), and cerebral infarction without residual deficits: Secondary | ICD-10-CM | POA: Diagnosis not present

## 2024-03-03 DIAGNOSIS — I10 Essential (primary) hypertension: Secondary | ICD-10-CM | POA: Diagnosis not present

## 2024-03-10 ENCOUNTER — Ambulatory Visit: Admitting: Podiatry

## 2024-03-24 ENCOUNTER — Ambulatory Visit: Admitting: Podiatry

## 2024-04-05 DIAGNOSIS — R42 Dizziness and giddiness: Secondary | ICD-10-CM | POA: Diagnosis not present

## 2024-04-05 DIAGNOSIS — I10 Essential (primary) hypertension: Secondary | ICD-10-CM | POA: Diagnosis not present

## 2024-04-05 DIAGNOSIS — R7309 Other abnormal glucose: Secondary | ICD-10-CM | POA: Diagnosis not present
